# Patient Record
Sex: Female | Born: 1935 | Race: White | Hispanic: No | Smoking: Former smoker
Health system: Southern US, Community
[De-identification: ages and names within clinical notes are randomized; demographics above are authoritative.]

## PROBLEM LIST (undated history)

## (undated) DIAGNOSIS — K2990 Gastroduodenitis, unspecified, without bleeding: Secondary | ICD-10-CM

## (undated) DIAGNOSIS — Z860100 Personal history of colon polyps, unspecified: Secondary | ICD-10-CM

## (undated) DIAGNOSIS — F419 Anxiety disorder, unspecified: Secondary | ICD-10-CM

## (undated) DIAGNOSIS — R778 Other specified abnormalities of plasma proteins: Secondary | ICD-10-CM

## (undated) DIAGNOSIS — D649 Anemia, unspecified: Secondary | ICD-10-CM

## (undated) DIAGNOSIS — K222 Esophageal obstruction: Secondary | ICD-10-CM

## (undated) DIAGNOSIS — K5790 Diverticulosis of intestine, part unspecified, without perforation or abscess without bleeding: Secondary | ICD-10-CM

## (undated) DIAGNOSIS — R7989 Other specified abnormal findings of blood chemistry: Secondary | ICD-10-CM

## (undated) DIAGNOSIS — K2970 Gastritis, unspecified, without bleeding: Secondary | ICD-10-CM

## (undated) DIAGNOSIS — R002 Palpitations: Secondary | ICD-10-CM

## (undated) DIAGNOSIS — I10 Essential (primary) hypertension: Secondary | ICD-10-CM

## (undated) DIAGNOSIS — C4492 Squamous cell carcinoma of skin, unspecified: Secondary | ICD-10-CM

## (undated) DIAGNOSIS — I499 Cardiac arrhythmia, unspecified: Secondary | ICD-10-CM

## (undated) HISTORY — DX: Essential (primary) hypertension: I10

## (undated) HISTORY — DX: Anemia, unspecified: D64.9

## (undated) HISTORY — DX: Diverticulosis of intestine, part unspecified, without perforation or abscess without bleeding: K57.90

## (undated) HISTORY — DX: Cardiac arrhythmia, unspecified: I49.9

## (undated) HISTORY — DX: Anxiety disorder, unspecified: F41.9

## (undated) HISTORY — DX: Gastritis, unspecified, without bleeding: K29.70

## (undated) HISTORY — DX: Personal history of colonic polyps: Z86.010

## (undated) HISTORY — PX: OTHER SURGICAL HISTORY: SHX169

## (undated) HISTORY — PX: HEMORRHOID SURGERY: SHX153

## (undated) HISTORY — PX: BREAST REDUCTION SURGERY: SHX8

## (undated) HISTORY — DX: Other specified abnormal findings of blood chemistry: R79.89

## (undated) HISTORY — DX: Personal history of colon polyps, unspecified: Z86.0100

## (undated) HISTORY — DX: Gastritis, unspecified, without bleeding: K29.90

## (undated) HISTORY — PX: ABDOMINAL HYSTERECTOMY: SHX81

## (undated) HISTORY — DX: Esophageal obstruction: K22.2

## (undated) HISTORY — DX: Other specified abnormalities of plasma proteins: R77.8

---
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
## 1997-10-28 ENCOUNTER — Emergency Department (HOSPITAL_COMMUNITY): Admission: EM | Admit: 1997-10-28 | Discharge: 1997-10-28 | Payer: Self-pay | Admitting: Emergency Medicine

## 1998-01-08 ENCOUNTER — Emergency Department (HOSPITAL_COMMUNITY): Admission: EM | Admit: 1998-01-08 | Discharge: 1998-01-08 | Payer: Self-pay | Admitting: Emergency Medicine

## 1998-01-10 ENCOUNTER — Ambulatory Visit (HOSPITAL_COMMUNITY): Admission: RE | Admit: 1998-01-10 | Discharge: 1998-01-10 | Payer: Self-pay | Admitting: Gastroenterology

## 2000-05-25 ENCOUNTER — Ambulatory Visit (HOSPITAL_COMMUNITY): Admission: RE | Admit: 2000-05-25 | Discharge: 2000-05-25 | Payer: Self-pay | Admitting: Vascular Surgery

## 2000-05-25 ENCOUNTER — Encounter: Payer: Self-pay | Admitting: Vascular Surgery

## 2001-02-28 ENCOUNTER — Encounter: Payer: Self-pay | Admitting: Internal Medicine

## 2001-02-28 ENCOUNTER — Observation Stay (HOSPITAL_COMMUNITY): Admission: EM | Admit: 2001-02-28 | Discharge: 2001-03-01 | Payer: Self-pay | Admitting: Emergency Medicine

## 2002-02-20 ENCOUNTER — Encounter (INDEPENDENT_AMBULATORY_CARE_PROVIDER_SITE_OTHER): Payer: Self-pay | Admitting: Specialist

## 2002-02-20 ENCOUNTER — Inpatient Hospital Stay (HOSPITAL_COMMUNITY): Admission: RE | Admit: 2002-02-20 | Discharge: 2002-02-22 | Payer: Self-pay | Admitting: Obstetrics and Gynecology

## 2002-08-30 ENCOUNTER — Encounter: Payer: Self-pay | Admitting: Emergency Medicine

## 2002-08-30 ENCOUNTER — Emergency Department (HOSPITAL_COMMUNITY): Admission: EM | Admit: 2002-08-30 | Discharge: 2002-08-30 | Payer: Self-pay | Admitting: Emergency Medicine

## 2004-03-31 ENCOUNTER — Ambulatory Visit: Payer: Self-pay | Admitting: Internal Medicine

## 2004-04-07 ENCOUNTER — Ambulatory Visit: Payer: Self-pay | Admitting: Internal Medicine

## 2004-04-22 ENCOUNTER — Ambulatory Visit: Payer: Self-pay | Admitting: Internal Medicine

## 2004-06-24 ENCOUNTER — Ambulatory Visit: Payer: Self-pay

## 2004-11-13 ENCOUNTER — Ambulatory Visit: Payer: Self-pay | Admitting: Internal Medicine

## 2005-04-06 ENCOUNTER — Ambulatory Visit: Payer: Self-pay | Admitting: Internal Medicine

## 2005-07-09 ENCOUNTER — Ambulatory Visit: Payer: Self-pay | Admitting: Internal Medicine

## 2005-07-16 ENCOUNTER — Ambulatory Visit: Payer: Self-pay | Admitting: Internal Medicine

## 2005-07-19 ENCOUNTER — Ambulatory Visit: Payer: Self-pay | Admitting: Internal Medicine

## 2005-08-13 ENCOUNTER — Ambulatory Visit: Payer: Self-pay | Admitting: Internal Medicine

## 2006-01-19 ENCOUNTER — Ambulatory Visit: Payer: Self-pay | Admitting: Internal Medicine

## 2007-02-03 ENCOUNTER — Ambulatory Visit: Payer: Self-pay | Admitting: Internal Medicine

## 2008-03-11 ENCOUNTER — Ambulatory Visit: Payer: Self-pay | Admitting: Internal Medicine

## 2008-03-26 ENCOUNTER — Ambulatory Visit: Payer: Self-pay | Admitting: Internal Medicine

## 2008-03-26 DIAGNOSIS — J019 Acute sinusitis, unspecified: Secondary | ICD-10-CM | POA: Insufficient documentation

## 2008-04-02 DIAGNOSIS — Z860100 Personal history of colon polyps, unspecified: Secondary | ICD-10-CM | POA: Insufficient documentation

## 2008-04-02 DIAGNOSIS — M810 Age-related osteoporosis without current pathological fracture: Secondary | ICD-10-CM | POA: Insufficient documentation

## 2008-04-02 DIAGNOSIS — K5730 Diverticulosis of large intestine without perforation or abscess without bleeding: Secondary | ICD-10-CM | POA: Insufficient documentation

## 2008-04-02 DIAGNOSIS — K219 Gastro-esophageal reflux disease without esophagitis: Secondary | ICD-10-CM

## 2008-04-02 DIAGNOSIS — F411 Generalized anxiety disorder: Secondary | ICD-10-CM

## 2008-04-02 DIAGNOSIS — F419 Anxiety disorder, unspecified: Secondary | ICD-10-CM | POA: Insufficient documentation

## 2008-04-02 DIAGNOSIS — D509 Iron deficiency anemia, unspecified: Secondary | ICD-10-CM | POA: Insufficient documentation

## 2008-04-02 DIAGNOSIS — I10 Essential (primary) hypertension: Secondary | ICD-10-CM | POA: Insufficient documentation

## 2008-04-02 HISTORY — DX: Gastro-esophageal reflux disease without esophagitis: K21.9

## 2008-07-31 ENCOUNTER — Ambulatory Visit: Payer: Self-pay | Admitting: Internal Medicine

## 2008-07-31 DIAGNOSIS — I83009 Varicose veins of unspecified lower extremity with ulcer of unspecified site: Secondary | ICD-10-CM | POA: Insufficient documentation

## 2008-07-31 DIAGNOSIS — L97909 Non-pressure chronic ulcer of unspecified part of unspecified lower leg with unspecified severity: Secondary | ICD-10-CM

## 2008-07-31 LAB — CONVERTED CEMR LAB
ALT: 13 units/L (ref 0–35)
AST: 29 units/L (ref 0–37)
Albumin: 4.2 g/dL (ref 3.5–5.2)
Alkaline Phosphatase: 73 units/L (ref 39–117)
BUN: 13 mg/dL (ref 6–23)
Basophils Absolute: 0 10*3/uL (ref 0.0–0.1)
Basophils Relative: 0.9 % (ref 0.0–3.0)
Bilirubin Urine: NEGATIVE
Bilirubin, Direct: 0.4 mg/dL — ABNORMAL HIGH (ref 0.0–0.3)
CO2: 28 meq/L (ref 19–32)
Calcium: 9.6 mg/dL (ref 8.4–10.5)
Chloride: 105 meq/L (ref 96–112)
Cholesterol: 210 mg/dL (ref 0–200)
Creatinine, Ser: 0.7 mg/dL (ref 0.4–1.2)
Direct LDL: 119.5 mg/dL
Eosinophils Absolute: 0.1 10*3/uL (ref 0.0–0.7)
Eosinophils Relative: 1.5 % (ref 0.0–5.0)
GFR calc Af Amer: 105 mL/min
GFR calc non Af Amer: 86 mL/min
Glucose, Bld: 94 mg/dL (ref 70–99)
HCT: 38.9 % (ref 36.0–46.0)
HDL: 46.3 mg/dL (ref >39.0)
Hemoglobin: 13.5 g/dL (ref 12.0–15.0)
Ketones, ur: NEGATIVE mg/dL
Leukocytes, UA: NEGATIVE
Lymphocytes Relative: 26.4 % (ref 12.0–46.0)
MCHC: 34.6 g/dL (ref 30.0–36.0)
MCV: 90.6 fL (ref 78.0–100.0)
Monocytes Absolute: 0.3 10*3/uL (ref 0.1–1.0)
Monocytes Relative: 6.9 % (ref 3.0–12.0)
Neutro Abs: 2.9 10*3/uL (ref 1.4–7.7)
Neutrophils Relative %: 64.3 % (ref 43.0–77.0)
Nitrite: NEGATIVE
Platelets: 249 10*3/uL (ref 150–400)
Potassium: 5 meq/L (ref 3.5–5.1)
RBC: 4.29 M/uL (ref 3.87–5.11)
RDW: 13.3 % (ref 11.5–14.6)
Sed Rate: 25 mm/hr — ABNORMAL HIGH (ref 0–22)
Sodium: 141 meq/L (ref 135–145)
Specific Gravity, Urine: <=1.005 (ref 1.000–1.035)
TSH: 3.44 microintl units/mL (ref 0.35–5.50)
Total Bilirubin: 1.2 mg/dL (ref 0.3–1.2)
Total CHOL/HDL Ratio: 4.5
Total Protein, Urine: NEGATIVE mg/dL
Total Protein: 7.2 g/dL (ref 6.0–8.3)
Triglycerides: 151 mg/dL — ABNORMAL HIGH (ref 0–149)
Urine Glucose: NEGATIVE mg/dL
Urobilinogen, UA: 0.2 (ref 0.0–1.0)
VLDL: 30 mg/dL (ref 0–40)
Vit D, 25-Hydroxy: 21 ng/mL — ABNORMAL LOW (ref 30–89)
WBC: 4.5 10*3/uL (ref 4.5–10.5)
pH: 7 (ref 5.0–8.0)

## 2008-08-06 ENCOUNTER — Telehealth: Payer: Self-pay | Admitting: Internal Medicine

## 2009-03-17 ENCOUNTER — Encounter: Payer: Self-pay | Admitting: Internal Medicine

## 2009-03-20 ENCOUNTER — Ambulatory Visit: Payer: Self-pay | Admitting: Internal Medicine

## 2009-03-20 DIAGNOSIS — I491 Atrial premature depolarization: Secondary | ICD-10-CM | POA: Insufficient documentation

## 2009-03-20 HISTORY — DX: Atrial premature depolarization: I49.1

## 2009-09-01 ENCOUNTER — Telehealth: Payer: Self-pay | Admitting: Internal Medicine

## 2009-09-16 ENCOUNTER — Emergency Department (HOSPITAL_COMMUNITY): Admission: EM | Admit: 2009-09-16 | Discharge: 2009-09-16 | Payer: Self-pay | Admitting: Emergency Medicine

## 2010-03-23 ENCOUNTER — Ambulatory Visit: Payer: Self-pay | Admitting: Internal Medicine

## 2010-03-26 ENCOUNTER — Telehealth: Payer: Self-pay | Admitting: Internal Medicine

## 2010-06-21 LAB — CONVERTED CEMR LAB
ALT: 12 units/L (ref 0–35)
AST: 22 units/L (ref 0–37)
Albumin: 4.3 g/dL (ref 3.5–5.2)
Alkaline Phosphatase: 75 units/L (ref 39–117)
BUN: 16 mg/dL (ref 6–23)
Basophils Absolute: 0 10*3/uL (ref 0.0–0.1)
Basophils Relative: 0.5 % (ref 0.0–3.0)
Bilirubin, Direct: 0.1 mg/dL (ref 0.0–0.3)
CO2: 28 meq/L (ref 19–32)
Calcium: 9.1 mg/dL (ref 8.4–10.5)
Chloride: 98 meq/L (ref 96–112)
Creatinine, Ser: 0.7 mg/dL (ref 0.4–1.2)
Eosinophils Absolute: 0 10*3/uL (ref 0.0–0.7)
Eosinophils Relative: 0.6 % (ref 0.0–5.0)
GFR calc non Af Amer: 80.64 mL/min (ref >60)
Glucose, Bld: 76 mg/dL (ref 70–99)
HCT: 39 % (ref 36.0–46.0)
Hemoglobin: 13 g/dL (ref 12.0–15.0)
Lymphocytes Relative: 25.4 % (ref 12.0–46.0)
Lymphs Abs: 1.6 10*3/uL (ref 0.7–4.0)
MCHC: 33.3 g/dL (ref 30.0–36.0)
MCV: 93.9 fL (ref 78.0–100.0)
Monocytes Absolute: 0.4 10*3/uL (ref 0.1–1.0)
Monocytes Relative: 6.3 % (ref 3.0–12.0)
Neutro Abs: 4.4 10*3/uL (ref 1.4–7.7)
Neutrophils Relative %: 67.2 % (ref 43.0–77.0)
Platelets: 281 10*3/uL (ref 150.0–400.0)
Potassium: 4.7 meq/L (ref 3.5–5.1)
RBC: 4.15 M/uL (ref 3.87–5.11)
RDW: 14.3 % (ref 11.5–14.6)
Sodium: 141 meq/L (ref 135–145)
TSH: 2.86 microintl units/mL (ref 0.35–5.50)
Total Bilirubin: 0.9 mg/dL (ref 0.3–1.2)
Total Protein: 7 g/dL (ref 6.0–8.3)
WBC: 6.5 10*3/uL (ref 4.5–10.5)

## 2010-06-25 NOTE — Assessment & Plan Note (Signed)
Summary: f1y per check out/lg   CC:  1 year ROV; no complaints.  History of Present Illness: [name] [name] returns today for followup.  She is a very pleasant elderly woman with history of symptomatic palpitations and nonsustained SVT who we placed on flecainide therapy over 10 years ago.  She returns today for followup, and she has done well.  She has very rare palpitations which have not been particularly symptomatic over the last year.  No other complaints today.  Current Medications (verified): 1)  Vitamin D3 1000 Unit  Tabs (Cholecalciferol) .... 2 By Mouth Daily 2)  Vitamin D 16109 Unit  Caps (Ergocalciferol) .Marland Kitchen.. 1 By Mouth Weekly 3)  Flecainide Acetate 100 Mg Tabs (Flecainide Acetate) .... One Tab in The A.m.and 1/2 Tab in At Night  Allergies (verified): No Known Drug Allergies  Past History:  Past Medical History: Last updated: 07/31/2008 Anemia-iron deficiency Anxiety Colonic polyps, hx of - cecal Osteoporosis Hypertension paroxysmal SVT and symptomatic PAC's GERD esophageal stricture Diverticulosis, colon Vit D def 2010  Past Surgical History: Last updated: 03/26/2008 s/p rectocele repair 2003 Hemorrhoidectomy Hysterectomy s/p breast reduction surgury 1996  Review of Systems  The patient denies chest pain, syncope, dyspnea on exertion, and peripheral edema.    Vital Signs:  Patient profile:   77 year old female Height:      66.5 inches Weight:      160 pounds BMI:     25.53 Pulse rate:   74 / minute BP sitting:   160 / 80  (left arm) Cuff size:   regular  Vitals Entered By: [name] [name], EMT-P (March 20, 2009 11:44 AM)  Physical Exam  General:  Well developed, well nourished, in no acute distress.  HEENT: normal Neck: supple. No JVD. Carotids 2+ bilaterally no bruits Cor: RRR no rubs, gallops or murmur Lungs: CTA Ab: soft, nontender. nondistended. No HSM. Good bowel sounds Ext: warm. no cyanosis, clubbing or edema Neuro: alert and oriented.  Grossly nonfocal. affect pleasant    EKG  Procedure date:  03/20/2009  Findings:      Normal sinus rhythm with rate of:  74.  Impression & Recommendations:  Problem # 1:  ATRIAL PREMATURE BEATS (ICD-427.61) For over 10 yrs, [name] [name] has remained asymptomatic on flecainide.  I will continue this medicine.  We may at some point consider reducing her dose but her QRS today is only minimally prolonged so will continue as we are. The following medications were removed from the medication list:    Flecainide Acetate 100 Mg Tabs (Flecainide acetate) .Marland Kitchen... 1 1/2 by mouth daily Her updated medication list for this problem includes:    Flecainide Acetate 100 Mg Tabs (Flecainide acetate) ..... One tab in the a.m.and 1/2 tab in at night  Problem # 2:  HYPERTENSION (ICD-401.9) A low sodium diet is recommended in addition to her meds below. The following medications were removed from the medication list:    Hydrochlorothiazide 12.5 Mg Tabs (Hydrochlorothiazide) .Marland Kitchen... Take 1 tab  by mouth every morning  Patient Instructions: 1)  Your physician recommends that you schedule a follow-up appointment in: 12 months with Dr Ladona Ridgel Prescriptions: FLECAINIDE ACETATE 100 MG TABS (FLECAINIDE ACETATE) one tab in the a.m.and 1/2 tab in at night  #45 x 11   Entered by:   Dennis Bast, RN, BSN   Authorized by:   Laren Boom, MD, [facility]   Signed by:   Dennis Bast, RN, BSN on 03/20/2009   Method used:  [facility]        [facility]  [address] [phone][facility] (retail)       [address]       [place], [place]  [id number]       Ph: [phone] or [phone]       Fax: [phone]   RxID:   [id number]

## 2010-06-25 NOTE — Progress Notes (Signed)
Summary: sided effect from flecainide  Phone Note Call from Patient Call back at Home Phone [phone]   Refills Requested: Medication #1:   100 MG TABS one tab in the a.m.and 1/2 tab in at night Caller: Patient Reason for Call: Talk to Nurse Complaint: Nausea/Vomiting/Diarrhea Summary of Call: meds was changed FLECAINIDE ACETATE, c/o sided effect.  Initial call taken by: Lorne Skeens,  September 01, 2009 3:33 PM  Follow-up for Phone Call        spoke with pt she has been sick since 3:30AM.  She will call Dr Posey Rea and she is he can call her in something for the N/V.  Her temp is 99.9.  She probably has a virus.  Will call primary to get meds Dennis Bast, RN, BSN  September 01, 2009 3:42 PM

## 2010-06-25 NOTE — Assessment & Plan Note (Signed)
Summary: yearly/sl   Visit Type:  Follow-up Primary Provider:  Tresa Garter MD   History of Present Illness: [name] [name] returns today for followup.  She is a very pleasant elderly woman with history of symptomatic palpitations and nonsustained SVT who we placed on flecainide therapy over 10 years ago.  She returns today for followup, and she has done well.  She has very rare palpitations which have not been particularly symptomatic over the last year.  No other complaints today.  Current Medications (verified): 1)  Vitamin D3 1000 Unit  Tabs (Cholecalciferol) .... 2 By Mouth Daily 2)  Flecainide Acetate 100 Mg Tabs (Flecainide Acetate) .... One Tab in The A.m.and 1/2 Tab in At Night 3)  Lorazepam 1 Mg Tabs (Lorazepam) .... 1/2 Tab As Needed  Allergies (verified): No Known Drug Allergies  Past History:  Past Medical History: Last updated: 07/31/2008 Anemia-iron deficiency Anxiety Colonic polyps, hx of - cecal Osteoporosis Hypertension paroxysmal SVT and symptomatic PAC's GERD esophageal stricture Diverticulosis, colon Vit D def 2010  Past Surgical History: Last updated: 03/26/2008 s/p rectocele repair 2003 Hemorrhoidectomy Hysterectomy s/p breast reduction surgury 1996  Review of Systems  The patient denies chest pain, syncope, dyspnea on exertion, and peripheral edema.    Vital Signs:  Patient profile:   77 year old female Height:      66.5 inches Weight:      162 pounds BMI:     25.85 Pulse rate:   72 / minute BP sitting:   140 / 90  (left arm)  Vitals Entered By: Laurance Flatten CMA (March 23, 2010 11:42 AM)  Physical Exam  General:  Well developed, well nourished, in no acute distress.  HEENT: normal Neck: supple. No JVD. Carotids 2+ bilaterally no bruits Cor: RRR no rubs, gallops or murmur Lungs: CTA Ab: soft, nontender. nondistended. No HSM. Good bowel sounds Ext: warm. no cyanosis, clubbing or edema Neuro: alert and oriented. Grossly nonfocal.  affect pleasant    EKG  Procedure date:  03/23/2010  Findings:      Normal sinus rhythm with rate of:  67.  Impression & Recommendations:  Problem # 1:  ATRIAL PREMATURE BEATS (ICD-427.61) Her symptoms remain well controlled on flecainide.  As a consequence of the above problem and her flecainide, I will obtain screening labs today. Her updated medication list for this problem includes:    Flecainide Acetate 100 Mg Tabs (Flecainide acetate) ..... One tab in the a.m.and 1/2 tab in at night  Orders: TLB-BMP (Basic Metabolic Panel-BMET) (80048-METABOL) TLB-CBC Platelet - w/Differential (85025-CBCD) TLB-TSH (Thyroid Stimulating Hormone) (84443-TSH) TLB-Hepatic/Liver Function Pnl (80076-HEPATIC)  Problem # 2:  HYPERTENSION (ICD-401.9) Her blood pressure appears to be fairly well controlled. I have asked that she reduce her sodium intake.  She has been intolerant to medical therapy in the past with beta blockers. Orders: TLB-BMP (Basic Metabolic Panel-BMET) (80048-METABOL) TLB-CBC Platelet - w/Differential (85025-CBCD) TLB-TSH (Thyroid Stimulating Hormone) (84443-TSH) TLB-Hepatic/Liver Function Pnl (80076-HEPATIC)  Patient Instructions: 1)  Your physician wants you to follow-up in: 12 months with Dr Court Joy will receive a reminder letter in the mail two months in advance. If you don't receive a letter, please call our office to schedule the follow-up appointment.

## 2010-06-25 NOTE — Assessment & Plan Note (Signed)
Summary: head hurt,fever,cough,/$50/pn   Vital Signs:  Patient Profile:   77 Years Old Female O2 Sat:      94 % O2 treatment:    Room Air Temp:     99.2 degrees F oral Pulse rate:   83 / minute BP sitting:   140 / 78  (left arm) Cuff size:   regular  Vitals Entered By: Payton Spark CMA (March 26, 2008 2:24 PM)                 Preventive Care Screening  Colonoscopy:    Date:  12/29/2001    Next Due:  12/2006    Results:  Diverticulosis   Bone Density:    Date:  02/28/2004    Results:  abnormal std dev   Chief Complaint:  HA, fever, and cough.  History of Present Illness: here with acute onset sinus pain, pressure, fever and greenish d/c; no CP, sob, wheezing, or DOE    Updated Prior Medication List: FLECAINIDE ACETATE 100 MG TABS (FLECAINIDE ACETATE) 1 1/2 by mouth daily AZITHROMYCIN 250 MG TABS (AZITHROMYCIN) 2po qd for 1 day, then 1po qd for 4days, then stop PROMETHAZINE-CODEINE 6.25-10 MG/5ML SYRP (PROMETHAZINE-CODEINE) 1 tsp by mouth q 6 hrs as needed cough  Current Allergies (reviewed today): No known allergies   Past Medical History:    Reviewed history and no changes required:       Anemia-iron deficiency       Anxiety       Colonic polyps, hx of - cecal       Osteoporosis       Hypertension       paroxysmal SVT and symptomatic PAC's       GERD       esophageal stricture       Diverticulosis, colon  Past Surgical History:    Reviewed history and no changes required:       s/p rectocele repair 2003       Hemorrhoidectomy       Hysterectomy       s/p breast reduction surgury 1996   Family History:    Reviewed history and no changes required:       2 couine with lung cancer       remote relative with colon cancer       father died with CVA at 89 yo       sister with HTN       brother with renal disease  Social History:    Reviewed history and no changes required:       retired - Neurosurgeon       Married       Former  Smoker   Risk Factors:  Tobacco use:  quit  Colonoscopy History:     Date of Last Colonoscopy:  12/29/2001    Results:  Diverticulosis    Review of Systems       all otherwise negative    Physical Exam  General:     alert and well-developed.  , mild ill  Head:     Normocephalic and atraumatic without obvious abnormalities. No apparent alopecia or balding. Eyes:     No corneal or conjunctival inflammation noted. EOMI. Perrla. Ears:     bilat tm's red, sinus tender bilat Nose:     nasal dischargemucosal pallor and mucosal erythema.   Mouth:     pharyngeal erythema and fair dentition.   Neck:     supple and  full ROM.   Lungs:     Normal respiratory effort, chest expands symmetrically. Lungs are clear to auscultation, no crackles or wheezes. Heart:     Normal rate and regular rhythm. S1 and S2 normal without gallop, murmur, click, rub or other extra sounds. Extremities:     no edema, no ulcers     Impression & Recommendations:  Problem # 1:  SINUSITIS- ACUTE-NOS (ICD-461.9) tx with antibx, and mucine DM otc -  Her updated medication list for this problem includes:    Azithromycin 250 Mg Tabs (Azithromycin) .Marland Kitchen... 2po qd for 1 day, then 1po qd for 4days, then stop    Promethazine-codeine 6.25-10 Mg/40ml Syrp (Promethazine-codeine) .Marland Kitchen... 1 tsp by mouth q 6 hrs as needed cough   Complete Medication List: 1)  Flecainide Acetate 100 Mg Tabs (Flecainide acetate) .Marland Kitchen.. 1 1/2 by mouth daily 2)  Azithromycin 250 Mg Tabs (Azithromycin) .... 2po qd for 1 day, then 1po qd for 4days, then stop 3)  Promethazine-codeine 6.25-10 Mg/72ml Syrp (Promethazine-codeine) .Marland Kitchen.. 1 tsp by mouth q 6 hrs as needed cough   Patient Instructions: 1)  Please take all new medications as prescribed 2)  Continue all medications that you may have been taking previously 3)  you can also take Mucine DM (OTC) for congestion if needed   Prescriptions: PROMETHAZINE-CODEINE 6.25-10 MG/5ML SYRP  (PROMETHAZINE-CODEINE) 1 tsp by mouth q 6 hrs as needed cough  #6 oz x 1   Entered and Authorized by:   Corwin Levins MD   Signed by:   Corwin Levins MD on 03/26/2008   Method used:   Print then Give to Patient   RxID:   [id number] AZITHROMYCIN 250 MG TABS (AZITHROMYCIN) 2po qd for 1 day, then 1po qd for 4days, then stop  #6 x 1   Entered and Authorized by:   Corwin Levins MD   Signed by:   Corwin Levins MD on 03/26/2008   Method used:   Print then Give to Patient   RxID:   [id number]  ]

## 2010-06-25 NOTE — Assessment & Plan Note (Signed)
Summary: left leg red spot//?infection/$50/cd   Vital Signs:  Patient profile:   77 year old female Weight:      164 pounds Temp:     97 degrees F oral Pulse rate:   72 / minute BP sitting:   160 / 100  (left arm)  Vitals Entered By: Tora Perches (July 31, 2008 9:58 AM) Is Patient Diabetic? No   History of Present Illness: C/o painless ulcer on L inner ankle x 2 wks. F/u HTN, GERD  Problems Prior to Update: 1)  Diverticulosis, Colon  (ICD-562.10) 2)  Gerd  (ICD-530.81) 3)  Hypertension  (ICD-401.9) 4)  Osteoporosis  (ICD-733.00) 5)  Colonic Polyps, Hx of  (ICD-V12.72) 6)  Anxiety  (ICD-300.00) 7)  Anemia-iron Deficiency  (ICD-280.9) 8)  Sinusitis- Acute-nos  (ICD-461.9)  Medications Prior to Update: 1)  Flecainide Acetate 100 Mg Tabs (Flecainide Acetate) .Marland Kitchen.. 1 1/2 By Mouth Daily 2)  Azithromycin 250 Mg Tabs (Azithromycin) .... 2po Qd For 1 Day, Then 1po Qd For 4days, Then Stop 3)  Promethazine-Codeine 6.25-10 Mg/38ml Syrp (Promethazine-Codeine) .Marland Kitchen.. 1 Tsp By Mouth Q 6 Hrs As Needed Cough  Current Medications (verified): 1)  Flecainide Acetate 100 Mg Tabs (Flecainide Acetate) .Marland Kitchen.. 1 1/2 By Mouth Daily  Allergies (verified): No Known Drug Allergies  Past Medical History:    Anemia-iron deficiency    Anxiety    Colonic polyps, hx of - cecal    Osteoporosis    Hypertension    paroxysmal SVT and symptomatic PAC's    GERD    esophageal stricture    Diverticulosis, colon    Vit D def 2010  Physical Exam  General:  NAD Mouth:  Oral mucosa and oropharynx without lesions or exudates.  Teeth in good repair. Neck:  supple and full ROM.   Lungs:  Normal respiratory effort, chest expands symmetrically. Lungs are clear to auscultation, no crackles or wheezes. Heart:  Normal rate and regular rhythm. S1 and S2 normal without gallop, murmur, click, rub or other extra sounds. Msk:  No deformity or scoliosis noted of thoracic or lumbar spine.   Extremities:  trace left pedal  edema.   Neurologic:  No cranial nerve deficits noted. Station and gait are normal. Plantar reflexes are down-going bilaterally. DTRs are symmetrical throughout. Sensory, motor and coordinative functions appear intact. Skin:  8 mm shallow ulcer distal medial leg Psych:  Cognition and judgment appear intact. Alert and cooperative with normal attention span and concentration. No apparent delusions, illusions, hallucinations   Impression & Recommendations:  Problem # 1:  VENOUS STASIS ULCER (ICD-454.0) LLE Assessment New  Elevate LE Compr socks Triamc qid  Orders: TLB-BMP (Basic Metabolic Panel-BMET) (80048-METABOL) TLB-CBC Platelet - w/Differential (85025-CBCD) TLB-Lipid Panel (80061-LIPID) TLB-Hepatic/Liver Function Pnl (80076-HEPATIC) TLB-Sedimentation Rate (ESR) (85652-ESR) TLB-TSH (Thyroid Stimulating Hormone) (84443-TSH) T-Vitamin D (25-Hydroxy) (04540-98119) TLB-Udip ONLY (81003-UDIP)  Problem # 2:  HYPERTENSION (ICD-401.9) Assessment: Comment Only  States BP OK at home  Her updated medication list for this problem includes:    Hydrochlorothiazide 12.5 Mg Tabs (Hydrochlorothiazide) .Marland Kitchen... Take 1 tab  by mouth every morning  Orders: T-Vitamin D (25-Hydroxy) (14782-95621) TLB-BMP (Basic Metabolic Panel-BMET) (80048-METABOL) TLB-CBC Platelet - w/Differential (85025-CBCD) TLB-Lipid Panel (80061-LIPID) TLB-Hepatic/Liver Function Pnl (80076-HEPATIC) TLB-Sedimentation Rate (ESR) (85652-ESR) TLB-TSH (Thyroid Stimulating Hormone) (84443-TSH) TLB-Udip ONLY (81003-UDIP)  Problem # 3:  GERD (ICD-530.81) Assessment: Comment Only  Problem # 4:  ANXIETY (ICD-300.00) Assessment: Comment Only  Orders: T-Vitamin D (25-Hydroxy) (30865-78469) TLB-BMP (Basic Metabolic Panel-BMET) (80048-METABOL) TLB-CBC Platelet - w/Differential (85025-CBCD)  TLB-Lipid Panel (80061-LIPID) TLB-Hepatic/Liver Function Pnl (80076-HEPATIC) TLB-Sedimentation Rate (ESR) (85652-ESR) TLB-TSH (Thyroid  Stimulating Hormone) (84443-TSH) TLB-Udip ONLY (81003-UDIP)  Complete Medication List: 1)  Flecainide Acetate 100 Mg Tabs (Flecainide acetate) .Marland Kitchen.. 1 1/2 by mouth daily 2)  Triamcinolone Acetonide 0.1 % Oint (Triamcinolone acetonide) .... Use 4 times a day 3)  Vitamin D3 1000 Unit Tabs (Cholecalciferol) .... 2 by mouth daily 4)  Hydrochlorothiazide 12.5 Mg Tabs (Hydrochlorothiazide) .... Take 1 tab  by mouth every morning 5)  Vitamin D 16109 Unit Caps (Ergocalciferol) .Marland Kitchen.. 1 by mouth weekly  Patient Instructions: 1)  Please schedule a follow-up appointment in 1 month. 2)  The medication list was reviewed and reconciled.  All changed / newly prescribed medications were explained.  A complete medication list was provided to the patient / caregiver.  Prescriptions: VITAMIN D 60454 UNIT  CAPS (ERGOCALCIFEROL) 1 by mouth weekly  #8 x 0   Entered and Authorized by:   Tresa Garter MD   Signed by:   Tresa Garter MD on 08/07/2008   Method used:   Print then Give to Patient   RxID:   [id number] HYDROCHLOROTHIAZIDE 12.5 MG  TABS (HYDROCHLOROTHIAZIDE) Take 1 tab  by mouth every morning  #30 x 12   Entered and Authorized by:   Tresa Garter MD   Signed by:   Tresa Garter MD on 07/31/2008   Method used:   Print then Give to Patient   RxID:   [id number] TRIAMCINOLONE ACETONIDE 0.1 % OINT (TRIAMCINOLONE ACETONIDE) Use 4 times a day  #45 g x 3   Entered and Authorized by:   Tresa Garter MD   Signed by:   Tresa Garter MD on 07/31/2008   Method used:   Print then Give to Patient   RxID:   [id number]

## 2010-06-25 NOTE — Progress Notes (Signed)
Summary: lab results  Phone Note Call from Patient Call back at Home Phone [phone]   Caller: Patient Summary of Call: Patient called requesting lab results Initial call taken by: Rock Nephew CMA,  August 06, 2008 4:13 PM  Follow-up for Phone Call        Labs OK Vit D is low: take Rx Follow-up by: Tresa Garter MD,  August 07, 2008 12:06 PM      Prescriptions: VITAMIN D 14782 UNIT  CAPS (ERGOCALCIFEROL) 1 by mouth weekly  #8 x 0   Entered by:   Lamar Sprinkles   Authorized by:   Tresa Garter MD   Signed by:   Lamar Sprinkles on 08/07/2008   Method used:   [facility]        [facility]  [address] [phone]* (retail)       [address]       [place], [place]  [address]       Ph: [phone] or [phone]       Fax: [phone]   RxID:   [id number]

## 2010-06-25 NOTE — Medication Information (Signed)
Summary: Tax adviser   Imported By: Elenor Legato 03/20/2009 10:33:17  _____________________________________________________________________  External Attachment:    Type:   Image     Comment:   External Document

## 2010-06-25 NOTE — Progress Notes (Signed)
Summary: pt rtn your call  Phone Note Call from Patient Call back at Home Phone [phone]   Caller: Patient Reason for Call: Talk to Nurse, Talk to Doctor Summary of Call: she got your message about her lab work and would like you to mail her a copy Initial call taken by: Omer Jack,  March 26, 2010 4:44 PM  Follow-up for Phone Call        mailed Dennis Bast, RN, BSN  March 26, 2010 5:07 PM

## 2010-06-25 NOTE — Miscellaneous (Signed)
  Clinical Lists Changes  Medications: Added new medication of FLECAINIDE ACETATE 100 MG TABS (FLECAINIDE ACETATE) one tab in the a.m.and 1/2 tab in at night

## 2010-10-06 NOTE — Assessment & Plan Note (Signed)
[facility]                         ELECTROPHYSIOLOGY OFFICE NOTE   [name], [name]                          MRN:          [id number]  DATE:02/03/2007                            DOB:          December 24, 1929    [name] [name] returns today for followup office visit.  She is a very  pleasant, elderly woman with a history of symptomatic palpitations and  documented nonsustained atrial tachycardia who has maintained sinus  rhythm very nicely in the past year on flecainide therapy.  She denies  chest pain or shortness of breath.  She has very minimal palpitations.  She does complain of chronic anxiety.   PHYSICAL EXAMINATION:  GENERAL:  She is a pleasant, well-appearing,  elderly woman in no distress.  VITAL SIGNS:  Blood pressure 172/82, pulse 72 and regular, respirations  16, weight 163 pounds.  NECK:  No jugular venous distention.  No thyromegaly.  Trachea midline.  LUNGS:  Clear bilaterally to auscultation.  No wheezes, rales or rhonchi  present.  CARDIAC:  Regular rate and rhythm with normal S1, S2.  There are no  murmurs, rubs or gallops appreciated.  ABDOMEN:  Soft, nontender, nondistended.  There is no organomegaly.  EXTREMITIES:  No clubbing, cyanosis or edema.  Pulses are 2+ and  symmetric.   EKG demonstrates sinus rhythm with normal axis and intervals.  There was  first-degree AV block.   IMPRESSION:  1. Symptomatic palpitations.  2. Documented nonsustained atrial tachycardia.  3. Chronic flecainide therapy.   DISCUSSION:  Overall, [name] [name] is stable.  She will continue her  flecainide at 100 mg in the morning and 50 mg in the evening.  I have  given her a prescription for a very low dose of alprazolam for anxiety.  We will see her back in the office in 1 year.     Doylene Canning. Ladona Ridgel, MD  Electronically Signed    GWT/MedQ  DD: 02/03/2007  DT: 02/04/2007  Job #: [id number]

## 2010-10-06 NOTE — Assessment & Plan Note (Signed)
[facility]                         ELECTROPHYSIOLOGY OFFICE NOTE   [name] [name], [name] [name]                          MRN:          [id number]  DATE:03/11/2008                            DOB:          1929-09-25    [name] [name] returns today for followup.  She is a very pleasant elderly  woman with history of symptomatic palpitations and nonsustained SVT who  we placed on flecainide therapy over 10 years ago.  She returns today  for followup, and she has done well.  She has very rare palpitations  which have not been particularly symptomatic over the last year.  She  notes trouble with weight gain, otherwise had no specific complaints  today.   Her medications include:  1. Aspirin 81 mg daily.  2. Flecainide 100 mg in the morning and 15 in the evening.  3. Premarin as directed.   PHYSICAL EXAMINATION:  GENERAL:  She is a pleasant elderly-appearing  woman in no acute distress.  VITAL SIGNS:  Blood pressure today was 136/78, the pulse was 70 and  regular, the respirations were 18, and the weight was 160 pounds.  NECK:  No jugular venous distention.  LUNGS:  Clear bilaterally to auscultation.  No wheezes, rales, or  rhonchi are present.  CARDIOVASCULAR:  Regular rate and rhythm.  Normal S1 and S2.  EXTREMITIES:  Demonstrated no cyanosis, clubbing, or edema.  ABDOMEN:  Soft, nontender.   EKG demonstrates sinus rhythm with normal axis intervals.  The QRS  duration was 88 msec.   IMPRESSION:  1. Symptomatic palpitations and documented nonsustained      supraventricular tachycardia.  2. Nice relief with flecainide therapy.  3. Borderline hypertension.   DISCUSSION:  [name] [name] is stable, and she is maintaining sinus rhythm  very nicely on low-dose flecainide.  I will plan to see her back for  followup in 1 year.     Doylene Canning. Ladona Ridgel, MD  Electronically Signed    GWT/MedQ  DD: 03/11/2008  DT: 03/12/2008  Job #: [id number]

## 2010-10-09 NOTE — Assessment & Plan Note (Signed)
[facility]                           ELECTROPHYSIOLOGY OFFICE NOTE   [name], [name]                          MRN:          [id number]  DATE:01/19/2006                            DOB:          [birth year]    PROGRESS NOTE:  [name] [name] returns to the clinic today for followup. She is a  pleasant 77 year old woman with a history of symptomatic PAC's, who has been  treated very nicely now for nearly 8 years on Flecainide. She denies chest  pain or shortness of breath and has had no palpitations since we last saw  her.   PHYSICAL EXAMINATION:  GENERAL:  She is a pleasant elderly appearing woman  in no distress.  VITAL SIGNS:  Blood pressure 150/80, pulse 75 and regular. Respiratory rate  18. Weight 163 pounds.  NECK:  No jugular venous distention.  LUNGS:  Clear bilaterally to auscultation.  CARDIOVASCULAR:  Regular rate and rhythm. Normal S1 and S2.  EXTREMITIES:  No clubbing, cyanosis, or edema.   LABORATORY DATA:  EKG today demonstrates sinus rhythm with normal axis  intervals.   IMPRESSION:  Symptomatic premature atrial contractions, nicely controlled on  Flecainide.   DISCUSSION:  Overall, [name] [name] is stable. Today, we refilled her Flecainide  and a prescription for p.r.n. Lorazepam and will plan to see her back in the  office in 1 year.                                   Doylene Canning. Ladona Ridgel, MD   GWT/MedQ  DD:  01/19/2006  DT:  01/20/2006  Job #:  [id number]

## 2010-10-09 NOTE — Op Note (Signed)
NAME:  [name] [name], [name] [name]                             ACCOUNT NO.:  [id number]   MEDICAL RECORD NO.:  [id number]                   PATIENT TYPE:  INP   LOCATION:  0460                                 FACILITY:  [facility]   PHYSICIAN:  Katherine Roan, M.D.               DATE OF BIRTH:  [birth year]   DATE OF PROCEDURE:  02/20/2002  DATE OF DISCHARGE:                                 OPERATIVE REPORT   PREOPERATIVE DIAGNOSIS:  Genital prolapse with cystocele, rectocele,  enterocele.   POSTOPERATIVE DIAGNOSIS:  Genital prolapse with cystocele, rectocele,  enterocele.   PROCEDURES:  1. Pelvic examination under anesthesia.  2. Enterocele repair.  3. Anterior repair.  4. Posterior repair.  5. Perineoplasty.  6. Hemorrhoidectomy by Sheppard Plumber. Earlene Plater, M.D.   DESCRIPTION OF PROCEDURE:  The patient was placed in a lithotomy position.  Examination under anesthesia revealed a second degree cystocele with  prolapse to the introitus.  There were no pelvic masses noted, and there was  a large rectocele present.  The patient was then prepped and draped in the  usual fashion.  Foley catheter was inserted.  The apex of the vagina was  identified and the posterior part of the apex was dissected.  The enterocele  sac was identified.  The peritoneal cavity was entered, and the enterocele  was repaired with interrupted sutures of 3-0 Ethibond and 4-0 Vicryl.  The  peritoneum was then closed with a pursestring suture of 3-0 Ethibond.  We  then dissected the pubocervical vaginal fascia from the underlying vagina,  plicated this in the midline with interrupted sutures of 3-0 and 4-0 Vicryl  and 3-0 Ethibond.  A wedge of the vagina anteriorly was then removed and  then the vagina was closed with a submucosal 3-0 and 4-0 Vicryl and a 4-0  Vicryl figure-of-eight suture for the vaginal mucosa.  Following this the  perineoplasty wedge of the perineum was accomplished, and the vagina was  dissected posteriorly from the  underlying prerectal fascia.  The prerectal  fascia was then closed in the midline with interrupted sutures of 3-0 and 4-  0 Vicryl  and 3-0 Ethibond.  The wedge of the vagina was then removed, and  the vagina was closed with a locking suture of 2-0 chromic.  The transverse  perinei muscle was approximated with the bulbocavernosus muscle with  interrupted sutures of 3-0 Vicryl.  The vagina was then closed with a  locking suture of 2-0 Prolene and the perineal skin was closed with the same  suture subcuticularly.  No pack was inserted.  The vagina was irrigated.  Dr. Earlene Plater then performed the hemorrhoidectomy.  Katherine Roan, M.D.    SDM/MEDQ  D:  02/20/2002  T:  02/20/2002  Job:  [id number]   cc:   Sheppard Plumber. Earlene Plater, M.D.  Fax: [phone]

## 2010-10-09 NOTE — Op Note (Signed)
   NAME:  [name] [name], [name] [name]                             ACCOUNT NO.:  [id number]   MEDICAL RECORD NO.:  [id number]                   PATIENT TYPE:  INP   LOCATION:  0460                                 FACILITY:  [facility]   PHYSICIAN:  Timothy E. Earlene Plater, M.D.              DATE OF BIRTH:  04-Feb-1930   DATE OF PROCEDURE:  02/20/2002  DATE OF DISCHARGE:                                 OPERATIVE REPORT   PREOPERATIVE DIAGNOSIS:  Internal hemorrhoid.   POSTOPERATIVE DIAGNOSIS:  Internal hemorrhoid.   OPERATIVE PROCEDURE:  Simple band ligation, internal hemorrhoid.   SURGEON:  Timothy E. Earlene Plater, M.D.   ANESTHESIA:  General.   INDICATIONS FOR PROCEDURE:  [name] has been working with Dr. Kyra Manges  because of enterocele, rectocele, rectal pain.  She does have a single right  anterior third-degree hemorrhoid that occasionally prolapses.  Her surgery  is planned as discussed by Dr. Elana Alm and has been carried out today by  him.  Since she is here, and since she is under anesthesia, we have agreed  to exam under anesthesia and treat hemorrhoids accordingly.   DESCRIPTION OF PROCEDURE:  The patient had been identified, the permit  signed in the preop area.  Dr. Elana Alm had finished his operative procedure,  as noted, separately.  At the conclusion of his procedure, the patient was  stable.  She was in the extended lithotomy position, and the vaginal and  perineal areas were operated and stable.  I gently dilated the rectum,  inserted the operating anoscope, and found a single third-degree right  anterior internal hemorrhoid only.  I placed a band ligation at that  location.  Careful inspection of the other areas revealed there to be no  pathology, and externally there was no pathology.  With this, I completed my  portion of the procedure.  The patient was awakened and taken to the  recovery room, all counts correct, and she was stable.                                               Timothy E.  Earlene Plater, M.D.    TED/MEDQ  D:  02/20/2002  T:  02/20/2002  Job:  [id number]   cc:   S. Kyra Manges, M.D.  [phone] [address]  [place]  [place] [address]  Fax: [phone]

## 2010-10-09 NOTE — Discharge Summary (Signed)
   NAME:  [name] [name], [name] [name]                             ACCOUNT NO.:  [id number]   MEDICAL RECORD NO.:  [id number]                   PATIENT TYPE:  INP   LOCATION:  0460                                 FACILITY[facility]   PHYSICIAN:  Katherine Roan, M.D.               DATE OF BIRTH:  November 21, 1929   DATE OF ADMISSION:  02/20/2002  DATE OF DISCHARGE:  02/22/2002                                 DISCHARGE SUMMARY   ADMISSION DIAGNOSIS:  Pelvic pressure and feeling that things are falling  out with genital prolapse consisting of enterocele, rectocele, and  symptomatic hemorrhoids.   HISTORY OF PRESENT ILLNESS:  The patient is a 77 year old female with pelvic  relaxation and had symptoms of pelvic pain and pelvic discomfort.  She was  also having symptomatic hemorrhoidal bleeding.  She was admitted for pelvic  reconstruction and hemorrhoidectomy by Dr. Earlene Plater.   LABORATORY DATA:  Admission hemoglobin was 12, creatinine was 0.7.   HOSPITAL COURSE:  The patient was admitted to the hospital, underwent a  pelvic examination under anesthesia, enterocele repair, anterior and  posterior repair, perineoplasty, and a hemorrhoidectomy by Dr. Earlene Plater, during  which time he did a simple ligation.  Postoperatively, she did quite well.  She was seen on 02/22/02, without complaints, up and about, voiding  satisfactorily.  She was asked to return to the office in two weeks, and to  avoid constipation.  She will call for fever, bleeding, or any other  difficulties she may encounter.   CONDITION ON DISCHARGE:  Improved.                                                 Katherine Roan, M.D.    SDM/MEDQ  D:  03/08/2002  T:  03/09/2002  Job:  [id number]   cc:   Sheppard Plumber. Earlene Plater, M.D.  Fax: [phone]

## 2010-10-09 NOTE — H&P (Signed)
NAME:  [name] [name], [name] [name] NO.:  [id number]   MEDICAL RECORD NO.:  [id number]                    PATIENT TYPE:   LOCATION:                                       FACILITY:   PHYSICIAN:  Katherine Roan, M.D.               DATE OF BIRTH:   DATE OF ADMISSION:  DATE OF DISCHARGE:                                HISTORY & PHYSICAL   CHIEF COMPLAINT:  Pelvic pressure and feeling like things are falling out,  also painful hemorrhoids.   HISTORY OF PRESENT ILLNESS:  The patient is a 77 year old female status post  hysterectomy in 1971 who presented with genital prolapse consisting of  rectocele and enterocele with pelvic pressure and inability to empty her  bowels correctly. She had a colonoscopy which was normal and was seen by Dr.  Kendrick Ranch who recommended that since we were going to repair the enterocele,  that he would consider removing the external hemorrhoids.   MEDICATIONS:  1. Lorazepam 1 mg daily.  2. She takes an antiarrhythmic drug called flecainide 100 mg b.i.d.  3. She takes aspirin and Premarin.   PAST MEDICAL HISTORY:  She had a breast biopsy in 1950 and 1970 and varicose  veins in 2001.   REVIEW OF SYSTEMS:  HEENT:  She wears glasses but has noted no decrease in  visual or auditory acuity.  CARDIOVASCULAR:  No history of heart attack.  No  history of mitral valve prolapse or heart murmur.  She had a history of  irregular heart rate which is treated with her antiarrhythmic drug.  LUNGS:  No chronic cough, no asthma.  GU:  She has noted no incontinence.  GI:  She  has had no bowel habit change, no weight loss or gain.  No melena.  MUSCLE,  BONES, AND JOINTS:  Unremarkable.   SOCIAL HISTORY:  She is retired and does not smoke or drink.   FAMILY HISTORY:  Her mother is deceased at age 75.  Father died at age 86 of  a CVA.  Her mother was diabetic.  She also has a sister who is diabetic.  Sister is 60.  She has had vascular disease.  She has  one brother who has  vascular disease as well.   PHYSICAL EXAMINATION:  GENERAL APPEARANCE:  A 77 year old female who appears  to be her stated age.  She is oriented to time, place and recent events.  VITAL SIGNS:  Weight is 152, blood pressure 120/80.  HEENT:  Unremarkable.  Oropharynx is not injected.  NECK:  Supple.  Thyroid is not enlarged.  Carotid pulses are equal without  bruits.  No masses are noted in the neck.  The trachea is in the midline.  BREASTS:  No masses or tenderness.  Axilla free from masses.  LUNGS:  Clear to percussion and auscultation.  CARDIOVASCULAR:  Normal sinus rhythm  with no murmurs.  ABDOMEN:  Soft and flat.  Liver, spleen and kidneys are not palpated.  Bowel  sounds are normal.  No tenderness.  GU:  Vulva and vagina normal.  There is a prolapse of the apex suggestive of  enterocele as well as rectocele.  EXTREMITIES:  There is good range of motion and equal pulses and reflexes.    IMPRESSION:  Large enterocele, rectocele all symptomatic and external  hemorrhoids.   PLAN:  Repair of genital prolapse.  Risks, benefits and failure rates have  been discussed with the patient.                                                Katherine Roan, M.D.    SDM/MEDQ  D:  02/19/2002  T:  02/19/2002  Job:  [id number]

## 2010-10-09 NOTE — Op Note (Signed)
[place]  Patient:    [name] [name], [name] [name]                            MRN: [id number] Proc. Date: 05/25/00 Adm. Date:  [id number] Attending:  Alyson Locket                           Operative Report  PREOPERATIVE DIAGNOSIS:  Symptomatic right leg venous varicosities.  POSTOPERATIVE DIAGNOSIS:  Symptomatic right leg venous varicosities.  PROCEDURE:  Ligation and stripping of saphenous vein from below knee to upper thigh segment and removal of tributary varicosities with stabilized avulsion technique.  SURGEON:  Dr. Tawanna Cooler Early.  ASSISTANT: Gershon Crane, P.A.C.  ANESTHESIA:  LMA  COMPLICATIONS:  None.  DISPOSITION:  To recovery room stable.  PROCEDURE IN DETAIL:  The patient was taken to the operating room and placed in position where the area of the right groin and right leg were prepped and draped in the usual sterile fashion.  The patient had been marked while standing prior to induction of anesthesia.  The patient had saphenous varicosities from the mid thigh to the level of the mid calf, and also had tributary varicosities off of this.  Using stab avulsion technique the tributary varicosities were all removed and the incisions were closed with subcuticular 4-0 Vicryl sutures.  The saphenous vein was exposed through the varus in the calf, and a stripper was passed up to the level of the mid thigh.  This was an area of another varicosity.  A separation incision was made over this area and the stripper was removed. The saphenous vein was ligated proximally and distally with Vicryl sutures.  The saphenous vein was stripped upper mid thigh to the mid calf, and pressure was held for hemostasis.  The incisions were all closed with 4-0 subcuticular Vicryl stitch.  Benzoin and Steri-Strips were applied to the incisions and Kerlix and Coban pressure dressing was applied.  The patient tolerated the procedure without immediate complication and was  transferred to the recovery room in stable condition. DD:  05/25/00 TD:  05/25/00 Job: [id number]

## 2010-10-09 NOTE — H&P (Signed)
[place]  Patient:    [name] [name], [name] [name] Visit Number: [id number] MRN: [id number]          Service Type: Attending:  Sonda Primes, M.D. [facility] Dictated by:   Sonda Primes, M.D. [facility] Adm. Date:  02/28/01                           History and Physical  24-HOUR OBSERVATION  DATE OF BIRTH:  1930/01/31  CHIEF COMPLAINT:  Light-headedness, weakness, anxiety, palpitations.  HISTORY OF PRESENT ILLNESS:  The patient is a 77 year old female with history of anxiety and palpitations who presents to the office with complaint of light-headedness, frequent irregular heartbeat, and weakness.  She did not sleep last night because of frequent palpitations.  There is no chest pain.  ALLERGIES:  None.  CURRENT MEDICATIONS: 1. Tambocor 100 mg in the morning, 250 mg at night. 2. Premarin one once a day, 0.625.  ALLERGIES:  No known drug allergies.  PAST MEDICAL HISTORY:  Palpitations, anxiety.  FAMILY HISTORY:  Parents deceased.  SOCIAL HISTORY:  She is a nonsmoker, she is married.  REVIEW OF SYSTEMS:  Problems started a couple days ago.  Descriptions as above.  The rest is negative.  PHYSICAL EXAMINATION:  VITAL SIGNS:  Blood pressure 130/80, pulse 84, temperature 98.6.  GENERAL:  She is in no acute distress, appears anxious.  HEENT:  Moist mucosa.  NECK:  Supple.  No thyromegaly or bruit.  LUNGS:  Clear to auscultation and percussion.  HEART:  Regular S1, S2, occasional irregular beats.  ABDOMEN:  Soft, nontender, no organomegaly, no masses felt.  EXTREMITIES:  Pulses symmetric.  Lower extremities without edema.  NEUROLOGIC:  She is alert, oriented, and cooperative.  LABORATORY:  EKG with supraventricular extra systole.  ASSESSMENT/PLAN: 1. Palpitations. Discontinue all caffeine.  Increase Tambocor to 150 mg    p.o. b.i.d. 2. Near syncope.  Admit for observation for telemetry.  Obtain CPK x 3 and    EKG in the morning.  May need a  cardiology consult. 3. Fatigue, due to problems #1 and #2.  Obtain labs including TSH, CBC,    sedimentation rate, CMET, urinalysis.  Obtain chest x-ray. 4. Hormone replacement therapy.  She was instructed to discontinue Premarin. Dictated by:   Sonda Primes, M.D. [facility] Attending:  Sonda Primes, M.D. [facility] DD:  02/28/01 TD:  02/28/01 Job: [id number]

## 2010-12-07 ENCOUNTER — Telehealth: Payer: Self-pay | Admitting: *Deleted

## 2010-12-07 NOTE — Telephone Encounter (Signed)
Rf req for Lorazepam 1 mg 1/2 po qhs prn. # 40. Last filled 03/2010. Ok to Rf?  Last OV was 07/2008.

## 2010-12-08 NOTE — Telephone Encounter (Signed)
OK to fill this prescription with additional refills x1 Thank you!  

## 2010-12-09 MED ORDER — LORAZEPAM 1 MG PO TABS: 0.5000 mg | ORAL_TABLET | Freq: Every evening | ORAL | Status: DC | PRN

## 2011-01-28 ENCOUNTER — Ambulatory Visit (INDEPENDENT_AMBULATORY_CARE_PROVIDER_SITE_OTHER): Payer: Medicare Other | Admitting: Internal Medicine

## 2011-01-28 ENCOUNTER — Encounter: Payer: Self-pay | Admitting: Internal Medicine

## 2011-01-28 VITALS — BP 156/96 | HR 83 | Resp 16 | Ht 67.0 in | Wt 161.0 lb

## 2011-01-28 DIAGNOSIS — I4891 Unspecified atrial fibrillation: Secondary | ICD-10-CM

## 2011-01-28 DIAGNOSIS — I491 Atrial premature depolarization: Secondary | ICD-10-CM

## 2011-01-28 MED ORDER — LORAZEPAM 1 MG PO TABS: 0.5000 mg | ORAL_TABLET | Freq: Every evening | ORAL | Status: DC | PRN

## 2011-01-28 MED ORDER — FLECAINIDE ACETATE 100 MG PO TABS: ORAL_TABLET | ORAL | Status: DC

## 2011-01-28 NOTE — Assessment & Plan Note (Signed)
Her blood pressure is elevated today but she states that at home it appears to be well controlled. She will continue her current medical therapy.

## 2011-01-28 NOTE — Assessment & Plan Note (Signed)
The patient's symptoms appear to be well controlled on medical therapy. I made no change in her medications today. She will see [place] back in several months.

## 2011-01-28 NOTE — Progress Notes (Signed)
HPI [name] [name] returns today for followup. She is a pleasant 77 year old woman with a history of symptomatic nonsustained SVT due to atrial tachycardia. The patient has been on flecainide therapy for many years. She has very minimal symptoms on medications. She notes that she has gained some weight in the last year because she has been under increasing stress. She has had multiple family members either die or get ill. She admits to dietary indiscretion. She denies PND or orthopnea. No chest pain. No syncope. No Known Allergies   Current Outpatient Prescriptions  Medication Sig Dispense Refill  . Cholecalciferol (VITAMIN D3) 1000 UNITS CAPS Take by mouth as directed. 2 po daily       . flecainide (TAMBOCOR) 100 MG tablet Take 100 mg by mouth as directed. 1 po  qam and 1/2 po qpm       . LORazepam (ATIVAN) 1 MG tablet Take 0.5 tablets (0.5 mg total) by mouth at bedtime as needed for anxiety.  40 tablet  1     Past Medical History  Diagnosis Date  . Anemia     iron def  . Anxiety   . History of colonic polyps     cecal  . Osteoporosis   . Hypertension   . Arrhythmia     paroxysmal SVT and symtomatic PAC's  . GERD (gastroesophageal reflux disease)   . Stricture esophagus   . Diverticulosis     colon  . Vitamin D deficiency     2010    ROS:   All systems reviewed and negative except as noted in the HPI.   Past Surgical History  Procedure Date  . Rectocele repair     s/p 2003  . Hemorrhoid surgery   . Breast reduction surgery     s/p 1996     Family History  Problem Relation Age of Onset  . Lung cancer      2 cousins  . Colon cancer      remote relative  . Stroke Father     @ 15  . Hypertension Sister   . Kidney disease      brother     History   Social History  . Marital Status: Married    Spouse Name: N/A    Number of Children: N/A  . Years of Education: N/A   Occupational History  . Retired    Social History Main Topics  . Smoking status: Former  Games developer  . Smokeless tobacco: Not on file  . Alcohol Use: Not on file  . Drug Use: Not on file  . Sexually Active: Not on file   Other Topics Concern  . Not on file   Social History Narrative  . No narrative on file     BP 156/96  Pulse 83  Resp 16  Ht 5\' 7"  (1.702 m)  Wt 161 lb (73.029 kg)  BMI 25.22 kg/m2  Physical Exam:  Well appearing elderly woman, NAD HEENT: Unremarkable Neck:  No JVD, no thyromegally Lymphatics:  No adenopathy Back:  No CVA tenderness Lungs:  Clear with no wheezes, rales, or rhonchi. HEART:  Regular rate rhythm, no murmurs, no rubs, no clicks Abd:  soft, positive bowel sounds, no organomegally, no rebound, no guarding Ext:  2 plus pulses, no edema, no cyanosis, no clubbing Skin:  No rashes no nodules Neuro:  CN II through XII intact, motor grossly intact  EKG Normal sinus rhythm  Assess/Plan:

## 2011-01-28 NOTE — Patient Instructions (Signed)
Your physician recommends that you schedule a follow-up appointment in: YEAR WITH DR TAYLOR Your physician recommends that you continue on your current medications as directed. Please refer to the Current Medication list given to you today. 

## 2011-06-22 ENCOUNTER — Telehealth: Payer: Self-pay | Admitting: *Deleted

## 2011-06-22 NOTE — Telephone Encounter (Signed)
Rf req for Lorazepam. 1/2 tab po prn sleep. #40 Ok to Rf?

## 2011-06-22 NOTE — Telephone Encounter (Signed)
OK to fill this prescription with additional refills x1. Pls sch OV Thank you!

## 2011-06-23 MED ORDER — LORAZEPAM 1 MG PO TABS: 0.5000 mg | ORAL_TABLET | Freq: Every evening | ORAL | Status: DC | PRN

## 2011-07-12 ENCOUNTER — Encounter: Payer: Self-pay | Admitting: Gynecology

## 2011-07-12 ENCOUNTER — Ambulatory Visit (INDEPENDENT_AMBULATORY_CARE_PROVIDER_SITE_OTHER): Payer: Medicare Other | Admitting: Gynecology

## 2011-07-12 VITALS — BP 118/82 | Ht 65.25 in | Wt 158.0 lb

## 2011-07-12 DIAGNOSIS — R82998 Other abnormal findings in urine: Secondary | ICD-10-CM | POA: Diagnosis not present

## 2011-07-12 DIAGNOSIS — N9489 Other specified conditions associated with female genital organs and menstrual cycle: Secondary | ICD-10-CM

## 2011-07-12 DIAGNOSIS — N644 Mastodynia: Secondary | ICD-10-CM | POA: Diagnosis not present

## 2011-07-12 DIAGNOSIS — M810 Age-related osteoporosis without current pathological fracture: Secondary | ICD-10-CM

## 2011-07-12 DIAGNOSIS — N949 Unspecified condition associated with female genital organs and menstrual cycle: Secondary | ICD-10-CM

## 2011-07-12 DIAGNOSIS — N390 Urinary tract infection, site not specified: Secondary | ICD-10-CM

## 2011-07-12 DIAGNOSIS — N952 Postmenopausal atrophic vaginitis: Secondary | ICD-10-CM | POA: Diagnosis not present

## 2011-07-12 DIAGNOSIS — N898 Other specified noninflammatory disorders of vagina: Secondary | ICD-10-CM | POA: Diagnosis not present

## 2011-07-12 LAB — URINALYSIS W MICROSCOPIC + REFLEX CULTURE
Bilirubin Urine: NEGATIVE
Casts: NONE SEEN
Crystals: NONE SEEN
Glucose, UA: NEGATIVE mg/dL
Ketones, ur: NEGATIVE mg/dL
Leukocytes, UA: NEGATIVE
Nitrite: NEGATIVE
Protein, ur: NEGATIVE mg/dL
Specific Gravity, Urine: 1.01 (ref 1.005–1.030)
Urobilinogen, UA: 0.2 mg/dL (ref 0.0–1.0)
pH: 7 (ref 5.0–8.0)

## 2011-07-12 LAB — WET PREP FOR TRICH, YEAST, CLUE
Clue Cells Wet Prep HPF POC: NONE SEEN
Trich, Wet Prep: NONE SEEN
Yeast Wet Prep HPF POC: NONE SEEN

## 2011-07-12 MED ORDER — FLUCONAZOLE 150 MG PO TABS: 150.0000 mg | ORAL_TABLET | Freq: Once | ORAL | Status: AC

## 2011-07-12 MED ORDER — NYSTATIN-TRIAMCINOLONE 100000-0.1 UNIT/GM-% EX OINT: TOPICAL_OINTMENT | Freq: Two times a day (BID) | CUTANEOUS | Status: DC

## 2011-07-12 MED ORDER — SULFAMETHOXAZOLE-TRIMETHOPRIM 800-160 MG PO TABS: 1.0000 | ORAL_TABLET | Freq: Two times a day (BID) | ORAL | Status: AC

## 2011-07-12 MED ORDER — NITROFURANTOIN MONOHYD MACRO 100 MG PO CAPS: 100.0000 mg | ORAL_CAPSULE | Freq: Two times a day (BID) | ORAL | Status: DC

## 2011-07-12 NOTE — Progress Notes (Signed)
[name] [name] November 27, 1929 [id number]        78 y.o. G2 P2 New patient with several issues noted below.  Past medical history,surgical history, medications, allergies, family history and social history were all reviewed and documented in the EPIC chart. ROS:  Was performed and pertinent positives and negatives are included in the history.  Exam: Sherrilyn Rist chaperone present Filed Vitals:   07/12/11 1212  BP: 118/82   General appearance  Normal Skin grossly normal Head/Neck normal with no cervical or supraclavicular adenopathy thyroid normal Lungs  clear Cardiac RR, without RMG Abdominal  soft, nontender, without masses, organomegaly or hernia Breasts  examined lying and sitting without masses, retractions, discharge or axillary adenopathy.  Well-healed scar right tail of Spence. Well-healed reduction scars Pelvic  Ext/BUS/vagina  normal with atrophic changes no gross evidence of cystocele rectocele or enterocele with Valsalva  Adnexa  Without masses or tenderness    Anus and perineum  normal   Rectovaginal  normal sphincter tone without palpated masses or tenderness.    Assessment/Plan:  78 y.o. female several issues noted below. 1. History of feeling something coming out of the vagina this morning while bathing. Patient notes some lower pelvic pressure most recently.  She is status post rectocele repair a number of years ago by Dr. Kyra Manges. Said the this reminds her of the symptoms she was having before. Describes it as an egg size bulging at the vaginal opening. Exam today is normal without significant rectocele no evidence of any bulging with Valsalva. Reviewed with patient, she will monitor for now and if she has recurrence she'll come in ASAP while she's feeling bulging so I can reexamine her and go from there. If indeed she has a recurrent rectocele or enterocele possible pessary use versus surgery was discussed. 2. Slight dysuria starting over the last one day. UA is low-grade positive.  Will treat with Septra DS 1 by mouth twice a day x3 days. 3. Vulvar pruritus.  Does have a slight white discharge. No overt vulvar changes other than atrophic changes. Wet prep is unremarkable. I am going to cover her for yeast with Diflucan 150x1 dose and I wrote her prescription for Mycolog cream to apply externally as needed for itching. If symptoms persist may consider biopsy but again there is no classic changes to suggest lichen sclerosus or other pathology. 4. Osteoporosis. She has a history of osteoporosis in her chart but she states that she was never told that she had this and denies ever having a DEXA study. I ordered a DEXA today and she will follow up with me for this. Increase calcium vitamin D reviewed. 5. Mammography. Patient's several years late for her mammogram and she knows importance of doing so. She has a daughter who died of breast cancer and I stressed the need to get her mammogram she agrees to do so. SBE monthly reviewed. 6. Pap smear. I reviewed current screening guidelines. She has no history of abnormal Pap smears in the past and since she is greater than 65 and status post hysterectomy we'll stop doing Pap smears altogether and she agrees with this. 7. Colonoscopy. Patient reports having one within the past 10 years. She'll follow up at their recommended repeat schedule. 8. Health maintenance. No blood work or other routine studies were ordered and she actively sees her primary physician will continue to do so and have this done through their office.   Dara Lords MD, 12:54 PM 07/12/2011

## 2011-07-12 NOTE — Patient Instructions (Signed)
Follow up for bone density study as scheduled. Follow up if vaginal bulging recurs or persists. Follow up if vaginal burning persists.

## 2011-07-14 LAB — URINE CULTURE
Colony Count: NO GROWTH
Organism ID, Bacteria: NO GROWTH

## 2011-07-15 ENCOUNTER — Encounter: Payer: Self-pay | Admitting: Gynecology

## 2011-07-16 ENCOUNTER — Encounter: Payer: Self-pay | Admitting: Gynecology

## 2011-07-19 ENCOUNTER — Other Ambulatory Visit (INDEPENDENT_AMBULATORY_CARE_PROVIDER_SITE_OTHER): Payer: Medicare Other

## 2011-07-19 ENCOUNTER — Ambulatory Visit (INDEPENDENT_AMBULATORY_CARE_PROVIDER_SITE_OTHER): Payer: Medicare Other | Admitting: Internal Medicine

## 2011-07-19 ENCOUNTER — Encounter: Payer: Self-pay | Admitting: Internal Medicine

## 2011-07-19 VITALS — BP 150/90 | HR 80 | Temp 97.0°F | Resp 16 | Wt 159.0 lb

## 2011-07-19 DIAGNOSIS — R202 Paresthesia of skin: Secondary | ICD-10-CM

## 2011-07-19 DIAGNOSIS — F4321 Adjustment disorder with depressed mood: Secondary | ICD-10-CM

## 2011-07-19 DIAGNOSIS — Z79899 Other long term (current) drug therapy: Secondary | ICD-10-CM | POA: Diagnosis not present

## 2011-07-19 DIAGNOSIS — G459 Transient cerebral ischemic attack, unspecified: Secondary | ICD-10-CM

## 2011-07-19 DIAGNOSIS — R209 Unspecified disturbances of skin sensation: Secondary | ICD-10-CM | POA: Diagnosis not present

## 2011-07-19 DIAGNOSIS — G319 Degenerative disease of nervous system, unspecified: Secondary | ICD-10-CM | POA: Diagnosis not present

## 2011-07-19 DIAGNOSIS — M810 Age-related osteoporosis without current pathological fracture: Secondary | ICD-10-CM

## 2011-07-19 DIAGNOSIS — Z8673 Personal history of transient ischemic attack (TIA), and cerebral infarction without residual deficits: Secondary | ICD-10-CM | POA: Insufficient documentation

## 2011-07-19 DIAGNOSIS — F411 Generalized anxiety disorder: Secondary | ICD-10-CM | POA: Diagnosis not present

## 2011-07-19 DIAGNOSIS — I10 Essential (primary) hypertension: Secondary | ICD-10-CM

## 2011-07-19 DIAGNOSIS — R51 Headache: Secondary | ICD-10-CM | POA: Diagnosis not present

## 2011-07-19 LAB — CBC WITH DIFFERENTIAL/PLATELET
Basophils Absolute: 0 10*3/uL (ref 0.0–0.1)
Basophils Relative: 0.7 % (ref 0.0–3.0)
Eosinophils Absolute: 0.1 10*3/uL (ref 0.0–0.7)
Eosinophils Relative: 0.9 % (ref 0.0–5.0)
HCT: 40.8 % (ref 36.0–46.0)
Hemoglobin: 13.7 g/dL (ref 12.0–15.0)
Lymphocytes Relative: 32.5 % (ref 12.0–46.0)
Lymphs Abs: 1.8 10*3/uL (ref 0.7–4.0)
MCHC: 33.5 g/dL (ref 30.0–36.0)
MCV: 93.8 fl (ref 78.0–100.0)
Monocytes Absolute: 0.4 10*3/uL (ref 0.1–1.0)
Monocytes Relative: 7.4 % (ref 3.0–12.0)
Neutro Abs: 3.2 10*3/uL (ref 1.4–7.7)
Neutrophils Relative %: 58.5 % (ref 43.0–77.0)
Platelets: 316 10*3/uL (ref 150.0–400.0)
RBC: 4.35 Mil/uL (ref 3.87–5.11)
RDW: 14 % (ref 11.5–14.6)
WBC: 5.5 10*3/uL (ref 4.5–10.5)

## 2011-07-19 LAB — URINALYSIS
Bilirubin Urine: NEGATIVE
Ketones, ur: NEGATIVE
Leukocytes, UA: NEGATIVE
Nitrite: NEGATIVE
Specific Gravity, Urine: 1.015 (ref 1.000–1.030)
Total Protein, Urine: NEGATIVE
Urine Glucose: NEGATIVE
Urobilinogen, UA: 0.2 (ref 0.0–1.0)
pH: 6 (ref 5.0–8.0)

## 2011-07-19 LAB — HEPATIC FUNCTION PANEL
ALT: 14 U/L (ref 0–35)
AST: 19 U/L (ref 0–37)
Albumin: 4.6 g/dL (ref 3.5–5.2)
Alkaline Phosphatase: 73 U/L (ref 39–117)
Bilirubin, Direct: 0.1 mg/dL (ref 0.0–0.3)
Total Bilirubin: 0.4 mg/dL (ref 0.3–1.2)
Total Protein: 7.5 g/dL (ref 6.0–8.3)

## 2011-07-19 LAB — LIPID PANEL
Cholesterol: 204 mg/dL — ABNORMAL HIGH (ref 0–200)
HDL: 59.2 mg/dL (ref >39.00)
Total CHOL/HDL Ratio: 3
Triglycerides: 169 mg/dL — ABNORMAL HIGH (ref 0.0–149.0)
VLDL: 33.8 mg/dL (ref 0.0–40.0)

## 2011-07-19 LAB — BASIC METABOLIC PANEL
BUN: 15 mg/dL (ref 6–23)
CO2: 30 mEq/L (ref 19–32)
Calcium: 9.7 mg/dL (ref 8.4–10.5)
Chloride: 104 mEq/L (ref 96–112)
Creatinine, Ser: 0.8 mg/dL (ref 0.4–1.2)
GFR: 75.63 mL/min (ref >60.00)
Glucose, Bld: 93 mg/dL (ref 70–99)
Potassium: 4.1 mEq/L (ref 3.5–5.1)
Sodium: 141 mEq/L (ref 135–145)

## 2011-07-19 LAB — TSH: TSH: 3.28 u[IU]/mL (ref 0.35–5.50)

## 2011-07-19 LAB — LDL CHOLESTEROL, DIRECT: Direct LDL: 108.9 mg/dL

## 2011-07-19 LAB — SEDIMENTATION RATE: Sed Rate: 26 mm/hr — ABNORMAL HIGH (ref 0–22)

## 2011-07-19 LAB — VITAMIN B12: Vitamin B-12: 409 pg/mL (ref 211–911)

## 2011-07-19 MED ORDER — ASCRIPTIN 325 MG PO TABS: ORAL_TABLET | ORAL | Status: DC

## 2011-07-19 MED ORDER — LORAZEPAM 1 MG PO TABS: 0.5000 mg | ORAL_TABLET | Freq: Every evening | ORAL | Status: DC | PRN

## 2011-07-19 MED ORDER — LOSARTAN POTASSIUM 100 MG PO TABS: 100.0000 mg | ORAL_TABLET | Freq: Every day | ORAL | Status: DC

## 2011-07-19 NOTE — Progress Notes (Signed)
  Subjective:    Patient ID: [name] [name], female    DOB: June 21, 1929, 78 y.o.   MRN: [id number]  HPI  Not seen in 3 years  C/o numbness in L face and L hand x 2 (last 2 wks ago) lasting x 20 min  The patient presents for a follow-up of  chronic hypertension, osteoporosis, anxiety (xanax helped)  She has been grieving her dtr died w/breast cancer     BP Readings from Last 3 Encounters:  07/19/11 150/90  07/12/11 118/82  01/28/11 156/96      Review of Systems  Constitutional: Negative for chills, activity change, appetite change, fatigue and unexpected weight change.  HENT: Negative for congestion, mouth sores and sinus pressure.   Eyes: Negative for visual disturbance.  Respiratory: Negative for cough and chest tightness.   Cardiovascular: Negative for leg swelling.  Gastrointestinal: Negative for nausea and abdominal pain.  Genitourinary: Negative for frequency, difficulty urinating and vaginal pain.  Musculoskeletal: Negative for back pain and gait problem.  Skin: Negative for pallor and rash.  Neurological: Negative for dizziness, tremors, weakness, numbness and headaches.  Psychiatric/Behavioral: Negative for suicidal ideas, confusion and sleep disturbance. The patient is nervous/anxious.        Objective:   Physical Exam  Constitutional: She appears well-developed and well-nourished. No distress.  HENT:  Head: Normocephalic.  Right Ear: External ear normal.  Left Ear: External ear normal.  Nose: Nose normal.  Mouth/Throat: Oropharynx is clear and moist.  Eyes: Conjunctivae are normal. Pupils are equal, round, and reactive to light. Right eye exhibits no discharge. Left eye exhibits no discharge.  Neck: Normal range of motion. Neck supple. No JVD present. No tracheal deviation present. No thyromegaly present.  Cardiovascular: Normal rate, regular rhythm and normal heart sounds.   Pulmonary/Chest: No stridor. No respiratory distress. She has no wheezes.  Abdominal:  Soft. Bowel sounds are normal. She exhibits no distension and no mass. There is no tenderness. There is no rebound and no guarding.  Musculoskeletal: She exhibits no edema and no tenderness.  Lymphadenopathy:    She has no cervical adenopathy.  Neurological: She displays normal reflexes. No cranial nerve deficit. She exhibits normal muscle tone. Coordination normal.  Skin: No rash noted. No erythema.       AKs  Psychiatric: She has a normal mood and affect. Her behavior is normal. Judgment and thought content normal.          Assessment & Plan:

## 2011-07-19 NOTE — Assessment & Plan Note (Signed)
Discussed.

## 2011-07-19 NOTE — Assessment & Plan Note (Signed)
Diet controlled - will watch

## 2011-07-19 NOTE — Assessment & Plan Note (Signed)
CT head Labs, incl B12 Carot [place]

## 2011-07-19 NOTE — Assessment & Plan Note (Signed)
Vit D 

## 2011-07-19 NOTE — Assessment & Plan Note (Signed)
Increase ASA B12, ESR, carot doppler

## 2011-07-19 NOTE — Assessment & Plan Note (Signed)
Situational  Potential benefits of a short/long term benzodiazepines  use as well as potential risks  and complications were explained to the patient and were aknowledged. Continue with current prescription therapy as reflected on the Med list.

## 2011-07-21 ENCOUNTER — Ambulatory Visit (INDEPENDENT_AMBULATORY_CARE_PROVIDER_SITE_OTHER): Payer: Medicare Other

## 2011-07-21 ENCOUNTER — Other Ambulatory Visit: Payer: Self-pay | Admitting: Cardiology

## 2011-07-21 ENCOUNTER — Other Ambulatory Visit: Payer: Self-pay | Admitting: Gynecology

## 2011-07-21 ENCOUNTER — Ambulatory Visit (INDEPENDENT_AMBULATORY_CARE_PROVIDER_SITE_OTHER)
Admission: RE | Admit: 2011-07-21 | Discharge: 2011-07-21 | Disposition: A | Discharge status: Home / Self Care | Payer: Medicare Other | Source: Ambulatory Visit | Attending: Cardiovascular Disease | Admitting: Cardiovascular Disease

## 2011-07-21 DIAGNOSIS — M810 Age-related osteoporosis without current pathological fracture: Secondary | ICD-10-CM

## 2011-07-21 DIAGNOSIS — I10 Essential (primary) hypertension: Secondary | ICD-10-CM | POA: Diagnosis not present

## 2011-07-21 DIAGNOSIS — R209 Unspecified disturbances of skin sensation: Secondary | ICD-10-CM

## 2011-07-21 DIAGNOSIS — G459 Transient cerebral ischemic attack, unspecified: Secondary | ICD-10-CM | POA: Diagnosis not present

## 2011-07-22 ENCOUNTER — Telehealth: Payer: Self-pay | Admitting: Gynecology

## 2011-07-22 ENCOUNTER — Encounter: Payer: Self-pay | Admitting: Gynecology

## 2011-07-22 NOTE — Telephone Encounter (Signed)
Tell patient that her bone density does show osteoporosis and she is to make an appointment with me to discuss treatment options.

## 2011-07-22 NOTE — Telephone Encounter (Signed)
Pt informed with the below note and will call back to make appointment. 

## 2011-07-26 ENCOUNTER — Encounter (INDEPENDENT_AMBULATORY_CARE_PROVIDER_SITE_OTHER): Payer: Medicare Other

## 2011-07-26 DIAGNOSIS — R209 Unspecified disturbances of skin sensation: Secondary | ICD-10-CM | POA: Diagnosis not present

## 2011-07-26 DIAGNOSIS — G459 Transient cerebral ischemic attack, unspecified: Secondary | ICD-10-CM

## 2011-07-28 ENCOUNTER — Telehealth: Payer: Self-pay | Admitting: Internal Medicine

## 2011-07-28 MED ORDER — CEFUROXIME AXETIL 500 MG PO TABS: 500.0000 mg | ORAL_TABLET | Freq: Two times a day (BID) | ORAL | Status: AC

## 2011-07-28 NOTE — Telephone Encounter (Signed)
[name] [name], please, inform patient that her head CT showed sinusitis. If sx  -- pls call in Ceftin 500 mg bid x 2 wks Thx

## 2011-07-28 NOTE — Telephone Encounter (Signed)
Pt informed. She c/o sinus drainage. Rx sent.

## 2011-07-29 ENCOUNTER — Ambulatory Visit (INDEPENDENT_AMBULATORY_CARE_PROVIDER_SITE_OTHER): Payer: Medicare Other | Admitting: Gynecology

## 2011-07-29 ENCOUNTER — Encounter: Payer: Self-pay | Admitting: Gynecology

## 2011-07-29 DIAGNOSIS — M810 Age-related osteoporosis without current pathological fracture: Secondary | ICD-10-CM

## 2011-07-29 DIAGNOSIS — E559 Vitamin D deficiency, unspecified: Secondary | ICD-10-CM | POA: Diagnosis not present

## 2011-07-29 MED ORDER — RISEDRONATE SODIUM 150 MG PO TABS: 150.0000 mg | ORAL_TABLET | ORAL | Status: DC

## 2011-07-29 NOTE — Patient Instructions (Signed)
Start on Actonel 150 mg monthly. Follow instructions that come with the medication. Let me know if you have any issues on starting this.

## 2011-07-29 NOTE — Progress Notes (Signed)
Patient presents to discuss her recent DEXA which shows osteoporosis with a -2.7 T score. She recently had a checkup at Dr. Posey Rea office which he had lab work drawn which showed a normal thyroid. She is known to be low on vitamin D with the last vitamin D check in 2010 showing 21.  I reviewed the DEXA results with her in detail and reviewed various treatment options with her. I discussed bisphosphonates, oral versus IV, Prolia, Evista, Forteo. Risks/benefits side effects profiles of each choice reviewed and ultimately we decided on a trial of oral bisphosphonates Actonel 150 mg monthly. Instructions on how to take the medication were reviewed the side effect profile to include reflux as well as long-term risks to include esophageal cancer, osteonecrosis of the jaw atypical fractures all discussed. Patient will start this and let me know if she has any issues but otherwise will follow up in a year. I did order a vitamin D level today to see where we stand.

## 2011-07-30 LAB — VITAMIN D 25 HYDROXY (VIT D DEFICIENCY, FRACTURES): Vit D, 25-Hydroxy: 33 ng/mL (ref 30–89)

## 2011-08-13 ENCOUNTER — Ambulatory Visit: Payer: Medicare Other | Admitting: Internal Medicine

## 2011-10-08 ENCOUNTER — Ambulatory Visit (INDEPENDENT_AMBULATORY_CARE_PROVIDER_SITE_OTHER): Payer: Medicare Other | Admitting: Internal Medicine

## 2011-10-08 ENCOUNTER — Encounter: Payer: Self-pay | Admitting: Internal Medicine

## 2011-10-08 VITALS — BP 148/88 | HR 80 | Temp 98.1°F | Resp 16 | Wt 160.0 lb

## 2011-10-08 DIAGNOSIS — G459 Transient cerebral ischemic attack, unspecified: Secondary | ICD-10-CM | POA: Diagnosis not present

## 2011-10-08 DIAGNOSIS — Z1239 Encounter for other screening for malignant neoplasm of breast: Secondary | ICD-10-CM

## 2011-10-08 DIAGNOSIS — I10 Essential (primary) hypertension: Secondary | ICD-10-CM | POA: Diagnosis not present

## 2011-10-08 DIAGNOSIS — N644 Mastodynia: Secondary | ICD-10-CM

## 2011-10-08 DIAGNOSIS — F411 Generalized anxiety disorder: Secondary | ICD-10-CM

## 2011-10-08 DIAGNOSIS — F4321 Adjustment disorder with depressed mood: Secondary | ICD-10-CM

## 2011-10-08 MED ORDER — LOSARTAN POTASSIUM 100 MG PO TABS: 100.0000 mg | ORAL_TABLET | Freq: Every day | ORAL | Status: DC

## 2011-10-08 NOTE — Patient Instructions (Signed)
Check BP at home

## 2011-10-08 NOTE — Assessment & Plan Note (Signed)
Continue with current prescription therapy as reflected on the Med list.  

## 2011-10-08 NOTE — Assessment & Plan Note (Signed)
Cont ASA

## 2011-10-08 NOTE — Assessment & Plan Note (Signed)
Better  

## 2011-10-08 NOTE — Progress Notes (Signed)
Patient ID: [name] [name], female   DOB: 03-04-1930, 78 y.o.   MRN: [id number]  Subjective:    Patient ID: [name] [name], female    DOB: 1929-12-08, 78 y.o.   MRN: [id number]  HPI  Not seen in 3 years  F/u numbness in L face and L hand x 2 (last 2 wks ago) lasting x 20 min - no relapse  The patient presents for a follow-up of  chronic hypertension, TIA, osteoporosis, anxiety (xanax helped)  She has been grieving her dtr died w/breast cancer     BP Readings from Last 3 Encounters:  10/08/11 148/88  07/19/11 150/90  07/12/11 118/82    Wt Readings from Last 3 Encounters:  10/08/11 160 lb (72.576 kg)  07/19/11 159 lb (72.122 kg)  07/12/11 158 lb (71.668 kg)      Review of Systems  Constitutional: Negative for chills, activity change, appetite change, fatigue and unexpected weight change.  HENT: Negative for congestion, mouth sores and sinus pressure.   Eyes: Negative for visual disturbance.  Respiratory: Negative for cough and chest tightness.   Cardiovascular: Negative for leg swelling.  Gastrointestinal: Negative for nausea and abdominal pain.  Genitourinary: Negative for frequency, difficulty urinating and vaginal pain.  Musculoskeletal: Negative for back pain and gait problem.  Skin: Negative for pallor and rash.  Neurological: Negative for dizziness, tremors, weakness, numbness and headaches.  Psychiatric/Behavioral: Negative for suicidal ideas, confusion and sleep disturbance. The patient is nervous/anxious.        Objective:   Physical Exam  Constitutional: She appears well-developed and well-nourished. No distress.  HENT:  Head: Normocephalic.  Right Ear: External ear normal.  Left Ear: External ear normal.  Nose: Nose normal.  Mouth/Throat: Oropharynx is clear and moist.  Eyes: Conjunctivae are normal. Pupils are equal, round, and reactive to light. Right eye exhibits no discharge. Left eye exhibits no discharge.  Neck: Normal range of motion. Neck supple. No JVD  present. No tracheal deviation present. No thyromegaly present.  Cardiovascular: Normal rate, regular rhythm and normal heart sounds.   Pulmonary/Chest: No stridor. No respiratory distress. She has no wheezes.  Abdominal: Soft. Bowel sounds are normal. She exhibits no distension and no mass. There is no tenderness. There is no rebound and no guarding.  Musculoskeletal: She exhibits no edema and no tenderness.  Lymphadenopathy:    She has no cervical adenopathy.  Neurological: She displays normal reflexes. No cranial nerve deficit. She exhibits normal muscle tone. Coordination normal.  Skin: No rash noted. No erythema.       AKs  Psychiatric: She has a normal mood and affect. Her behavior is normal. Judgment and thought content normal.     Lab Results  Component Value Date   WBC 5.5 07/19/2011   HGB 13.7 07/19/2011   HCT 40.8 07/19/2011   PLT 316.0 07/19/2011   GLUCOSE 93 07/19/2011   CHOL 204* 07/19/2011   TRIG 169.0* 07/19/2011   HDL 59.20 07/19/2011   LDLDIRECT 108.9 07/19/2011   ALT 14 07/19/2011   AST 19 07/19/2011   NA 141 07/19/2011   K 4.1 07/19/2011   CL 104 07/19/2011   CREATININE 0.8 07/19/2011   BUN 15 07/19/2011   CO2 30 07/19/2011   TSH 3.28 07/19/2011        Assessment & Plan:

## 2011-10-08 NOTE — Assessment & Plan Note (Signed)
Restart Cozaar 1 a day

## 2011-11-05 ENCOUNTER — Other Ambulatory Visit: Payer: Self-pay | Admitting: Internal Medicine

## 2011-11-05 ENCOUNTER — Ambulatory Visit
Admission: RE | Admit: 2011-11-05 | Discharge: 2011-11-05 | Disposition: A | Discharge status: Home / Self Care | Payer: Medicare Other | Source: Ambulatory Visit | Attending: Internal Medicine | Admitting: Internal Medicine

## 2011-11-05 ENCOUNTER — Ambulatory Visit: Admission: RE | Admit: 2011-11-05 | Payer: Medicare Other | Source: Ambulatory Visit

## 2011-11-05 DIAGNOSIS — N644 Mastodynia: Secondary | ICD-10-CM | POA: Diagnosis not present

## 2011-11-05 DIAGNOSIS — Z1239 Encounter for other screening for malignant neoplasm of breast: Secondary | ICD-10-CM

## 2011-11-05 DIAGNOSIS — Z853 Personal history of malignant neoplasm of breast: Secondary | ICD-10-CM

## 2011-11-15 DIAGNOSIS — Z85828 Personal history of other malignant neoplasm of skin: Secondary | ICD-10-CM | POA: Diagnosis not present

## 2011-11-15 DIAGNOSIS — L821 Other seborrheic keratosis: Secondary | ICD-10-CM | POA: Diagnosis not present

## 2011-11-15 DIAGNOSIS — L570 Actinic keratosis: Secondary | ICD-10-CM | POA: Diagnosis not present

## 2011-11-15 DIAGNOSIS — C44319 Basal cell carcinoma of skin of other parts of face: Secondary | ICD-10-CM | POA: Diagnosis not present

## 2011-11-15 DIAGNOSIS — D485 Neoplasm of uncertain behavior of skin: Secondary | ICD-10-CM | POA: Diagnosis not present

## 2011-12-21 DIAGNOSIS — L570 Actinic keratosis: Secondary | ICD-10-CM | POA: Diagnosis not present

## 2011-12-21 DIAGNOSIS — C44319 Basal cell carcinoma of skin of other parts of face: Secondary | ICD-10-CM | POA: Diagnosis not present

## 2011-12-24 ENCOUNTER — Ambulatory Visit (INDEPENDENT_AMBULATORY_CARE_PROVIDER_SITE_OTHER): Payer: Medicare Other | Admitting: Endocrinology

## 2011-12-24 ENCOUNTER — Encounter: Payer: Self-pay | Admitting: Endocrinology

## 2011-12-24 ENCOUNTER — Telehealth: Payer: Self-pay | Admitting: Internal Medicine

## 2011-12-24 VITALS — BP 128/82 | HR 74 | Temp 97.4°F | Wt 165.0 lb

## 2011-12-24 DIAGNOSIS — J309 Allergic rhinitis, unspecified: Secondary | ICD-10-CM | POA: Diagnosis not present

## 2011-12-24 NOTE — Progress Notes (Signed)
Subjective:    Patient ID: [name] [name], female    DOB: 10-05-1929, 78 y.o.   MRN: [id number]  HPI Pt states few mos of congestion in the nose, and assoc rhinorrhea.   Past Medical History  Diagnosis Date  . Anemia     iron def  . Anxiety   . History of colonic polyps     cecal  . Osteoporosis 07/21/2011    t score -2.7  . Hypertension   . Arrhythmia     paroxysmal SVT and symtomatic PAC's  . GERD (gastroesophageal reflux disease)   . Stricture esophagus   . Diverticulosis     colon  . Vitamin d deficiency     2010    Past Surgical History  Procedure Date  . A&p, enterocele repair     s/p 2003  . Hemorrhoid surgery   . Breast reduction surgery     s/p 1996  . Abdominal hysterectomy     History   Social History  . Marital Status: Married    Spouse Name: N/A    Number of Children: N/A  . Years of Education: N/A   Occupational History  . Retired    Social History Main Topics  . Smoking status: Former Smoker    Quit date: 05/24/1968  . Smokeless tobacco: Never Used   Comment: only smoked 7 years  . Alcohol Use: No  . Drug Use: No  . Sexually Active: No   Other Topics Concern  . Not on file   Social History Narrative  . No narrative on file    Current Outpatient Prescriptions on File Prior to Visit  Medication Sig Dispense Refill  . Cholecalciferol (VITAMIN D3) 1000 UNITS CAPS Take by mouth as directed. 2 po daily       . flecainide (TAMBOCOR) 100 MG tablet 1 TAB EVERY AM 1/2 TAB EVERY PM  135 tablet  3  . LORazepam (ATIVAN) 1 MG tablet Take 0.5 tablets (0.5 mg total) by mouth at bedtime as needed for anxiety.  30 tablet  2  . Aspirin Buf,AlHyd-MgHyd-CaCar, (ASCRIPTIN) 325 MG TABS 1 po qd pc  100 each  3  . losartan (COZAAR) 100 MG tablet Take 1 tablet (100 mg total) by mouth daily.  30 tablet  11    No Known Allergies  Family History  Problem Relation Age of Onset  . Lung cancer      2 cousins  . Colon cancer      remote relative  . Kidney  disease      brother  . Stroke Father     @ 83  . Hypertension Father   . Hypertension Sister   . Cancer Sister 23    brain  . Stroke Mother     medication induced  . Breast cancer Other 42    breast  . Breast cancer Daughter   . Cancer Daughter 52    breast  . Cancer Other 58    colon/MELANOMA    BP 128/82  Pulse 74  Temp 97.4 F (36.3 C) (Oral)  Wt 165 lb (74.844 kg)  SpO2 96%  LMP 05/24/1970  Review of Systems Denies fever, but she has decreased hearing in the left ear    Objective:   Physical Exam VITAL SIGNS:  See vs page GENERAL: no distress head: no deformity eyes: no periorbital swelling, no proptosis external nose and ears are normal mouth: no lesion seen Left eac: occluded with cerumen  Intervention: left eac  is irrigated Repeat exam is normal       Assessment & Plan:  Cerumen impaction, new.  Better with irrigation Allergic rhinitis, persitent

## 2011-12-24 NOTE — Patient Instructions (Addendum)
Here is a sample bottle of "Q-nasal."  Take 1 spray each side daily.  Call if this helps, and i would be happy to prescribe a similar generic spray bottle.

## 2011-12-24 NOTE — Telephone Encounter (Signed)
Caller: Lashawnda/Patient; PCP: Plotnikov, Alex; CB#: [phone];  Call regarding Left Ear Discomfort;  Patient states a week ago (12/17/11)  was in shower and got water in her left ear.  "Feels clogged up". Afebrile, No drainage.  She has been putting OTC swimmers ear in her ear.  She felt a "pop" in her ear about an hour ago.  She recent had recent CT scan for sinus issues but she could not take the "large" pill and she could not swallow them and did not take medication.  Emergent s/sx ruled out per Ear Symptoms Protocol with exception to "Pain in ear followed by pop" new or sudden onset change in hearing, ringing in ear, not resolved" . See provider in 24 hours.  Caller understands.  Appt scheduled today 12/24/11 with Dr. Everardo All at 14:00.  Caller expressed understanding with time , location . Advised to call back for questions, concerns or changes.

## 2011-12-25 DIAGNOSIS — J310 Chronic rhinitis: Secondary | ICD-10-CM | POA: Insufficient documentation

## 2012-01-18 ENCOUNTER — Ambulatory Visit: Payer: Medicare Other | Admitting: Internal Medicine

## 2012-02-07 ENCOUNTER — Telehealth: Payer: Self-pay | Admitting: *Deleted

## 2012-02-07 NOTE — Telephone Encounter (Signed)
Rf erq for Lorazepam 1 mg 1/2 po qhs prn. Last filled 12/08/11. Ok to Rf?

## 2012-02-07 NOTE — Telephone Encounter (Signed)
OK to fill this prescription with additional refills x2 Thank you!  

## 2012-02-08 ENCOUNTER — Encounter: Payer: Self-pay | Admitting: Internal Medicine

## 2012-02-08 ENCOUNTER — Ambulatory Visit (INDEPENDENT_AMBULATORY_CARE_PROVIDER_SITE_OTHER): Payer: Medicare Other | Admitting: Internal Medicine

## 2012-02-08 VITALS — BP 154/84 | HR 73 | Ht 65.0 in | Wt 161.0 lb

## 2012-02-08 DIAGNOSIS — I4891 Unspecified atrial fibrillation: Secondary | ICD-10-CM

## 2012-02-08 DIAGNOSIS — I491 Atrial premature depolarization: Secondary | ICD-10-CM

## 2012-02-08 MED ORDER — LORAZEPAM 1 MG PO TABS: 0.5000 mg | ORAL_TABLET | Freq: Every evening | ORAL | Status: DC | PRN

## 2012-02-08 MED ORDER — FLECAINIDE ACETATE 100 MG PO TABS: ORAL_TABLET | ORAL | Status: DC

## 2012-02-08 NOTE — Patient Instructions (Addendum)
Your physician wants you to follow-up in: 12 months with Dr. Taylor. You will receive a reminder letter in the mail two months in advance. If you don't receive a letter, please call our office to schedule the follow-up appointment.    

## 2012-02-08 NOTE — Progress Notes (Signed)
HPI [name] [name] returns today for followup. She is an 78 year old woman with a history of palpitations and documented premature atrial contractions. For over 10 years, her symptoms have been well-controlled with flecainide therapy. In the interim she has done well. She denies chest pain, shortness of breath, syncope, peripheral edema, or symptomatic palpitations. No Known Allergies   Current Outpatient Prescriptions  Medication Sig Dispense Refill  . Cholecalciferol (VITAMIN D3) 1000 UNITS CAPS Take by mouth as directed. 2 po daily       . flecainide (TAMBOCOR) 100 MG tablet 1 TAB EVERY AM 1/2 TAB EVERY PM  135 tablet  3  . LORazepam (ATIVAN) 1 MG tablet Take 0.5 tablets (0.5 mg total) by mouth at bedtime as needed for anxiety.  30 tablet  2  . DISCONTD: flecainide (TAMBOCOR) 100 MG tablet 1 TAB EVERY AM 1/2 TAB EVERY PM  135 tablet  3     Past Medical History  Diagnosis Date  . Anemia     iron def  . Anxiety   . History of colonic polyps     cecal  . Osteoporosis 07/21/2011    t score -2.7  . Hypertension   . Arrhythmia     paroxysmal SVT and symtomatic PAC's  . GERD (gastroesophageal reflux disease)   . Stricture esophagus   . Diverticulosis     colon  . Vitamin d deficiency     2010    ROS:   All systems reviewed and negative except as noted in the HPI.   Past Surgical History  Procedure Date  . A&p, enterocele repair     s/p 2003  . Hemorrhoid surgery   . Breast reduction surgery     s/p 1996  . Abdominal hysterectomy      Family History  Problem Relation Age of Onset  . Lung cancer      2 cousins  . Colon cancer      remote relative  . Kidney disease      brother  . Stroke Father     @ 60  . Hypertension Father   . Hypertension Sister   . Cancer Sister 69    brain  . Stroke Mother     medication induced  . Breast cancer Other 42    breast  . Breast cancer Daughter   . Cancer Daughter 6    breast  . Cancer Other 7    colon/MELANOMA      History   Social History  . Marital Status: Married    Spouse Name: N/A    Number of Children: N/A  . Years of Education: N/A   Occupational History  . Retired    Social History Main Topics  . Smoking status: Former Smoker    Quit date: 05/24/1968  . Smokeless tobacco: Never Used   Comment: only smoked 7 years  . Alcohol Use: No  . Drug Use: No  . Sexually Active: No   Other Topics Concern  . Not on file   Social History Narrative  . No narrative on file     BP 154/84  Pulse 73  Ht 5\' 5"  (1.651 m)  Wt 161 lb (73.029 kg)  BMI 26.79 kg/m2  SpO2 96%  LMP 05/24/1970  Physical Exam:  Well appearing elderly woman, NAD HEENT: Unremarkable Neck:  No JVD, no thyromegally Lungs:  Clear with no wheezes, rales, or rhonchi. HEART:  Regular rate rhythm, no murmurs, no rubs, no clicks Abd:  soft, positive  bowel sounds, no organomegally, no rebound, no guarding Ext:  2 plus pulses, no edema, no cyanosis, no clubbing Skin:  No rashes no nodules Neuro:  CN II through XII intact, motor grossly intact  EKG Normal sinus rhythm   Assess/Plan:

## 2012-02-08 NOTE — Assessment & Plan Note (Signed)
Her symptoms remain well controlled. She will continue her current dose of flecainide.

## 2012-02-08 NOTE — Telephone Encounter (Signed)
Done

## 2012-04-03 DIAGNOSIS — L570 Actinic keratosis: Secondary | ICD-10-CM | POA: Diagnosis not present

## 2012-04-03 DIAGNOSIS — D485 Neoplasm of uncertain behavior of skin: Secondary | ICD-10-CM | POA: Diagnosis not present

## 2012-06-02 DIAGNOSIS — Z23 Encounter for immunization: Secondary | ICD-10-CM | POA: Diagnosis not present

## 2012-08-02 DIAGNOSIS — H905 Unspecified sensorineural hearing loss: Secondary | ICD-10-CM | POA: Diagnosis not present

## 2012-08-02 DIAGNOSIS — H9209 Otalgia, unspecified ear: Secondary | ICD-10-CM | POA: Diagnosis not present

## 2012-08-09 ENCOUNTER — Other Ambulatory Visit: Payer: Self-pay | Admitting: *Deleted

## 2012-08-09 DIAGNOSIS — I4891 Unspecified atrial fibrillation: Secondary | ICD-10-CM

## 2012-08-09 MED ORDER — FLECAINIDE ACETATE 100 MG PO TABS: ORAL_TABLET | ORAL | Status: DC

## 2012-08-11 ENCOUNTER — Other Ambulatory Visit: Payer: Self-pay

## 2012-09-06 ENCOUNTER — Other Ambulatory Visit: Payer: Self-pay | Admitting: Internal Medicine

## 2012-09-07 ENCOUNTER — Other Ambulatory Visit: Payer: Self-pay | Admitting: Internal Medicine

## 2012-09-11 NOTE — Telephone Encounter (Signed)
Rx faxed to pharmacy  

## 2012-09-19 ENCOUNTER — Encounter: Payer: Self-pay | Admitting: Gynecology

## 2012-09-20 ENCOUNTER — Telehealth: Payer: Self-pay | Admitting: *Deleted

## 2012-09-20 MED ORDER — LORAZEPAM 1 MG PO TABS: 0.5000 mg | ORAL_TABLET | Freq: Every evening | ORAL | Status: DC | PRN

## 2012-09-20 NOTE — Telephone Encounter (Signed)
Pearla Dubonnet to call in 1 mg Ativan # 45, no refills, same instructions on previous RX. Please call the local pharmacy and see if they can cancel that prescription so she doesn't go pick it up.

## 2012-09-20 NOTE — Telephone Encounter (Signed)
Rf req for Lorazepam 1 mg to be sent to mail order for 90 day supply. Med was phoned in at local pharmacy on 09/11/12, but patient has not picked it up. Please advise in PCP's absence. Thanks!

## 2012-09-20 NOTE — Telephone Encounter (Signed)
Done

## 2012-09-28 ENCOUNTER — Encounter: Payer: Self-pay | Admitting: Internal Medicine

## 2012-09-28 ENCOUNTER — Ambulatory Visit (INDEPENDENT_AMBULATORY_CARE_PROVIDER_SITE_OTHER): Payer: Medicare Other | Admitting: Internal Medicine

## 2012-09-28 VITALS — BP 144/86 | HR 74 | Ht 65.0 in | Wt 162.4 lb

## 2012-09-28 DIAGNOSIS — I10 Essential (primary) hypertension: Secondary | ICD-10-CM | POA: Diagnosis not present

## 2012-09-28 DIAGNOSIS — R002 Palpitations: Secondary | ICD-10-CM

## 2012-09-28 NOTE — Assessment & Plan Note (Signed)
Her blood pressure is fairly well controlled. She is encouraged to reduce her salt intake and maintain her physical activity as tolerated.

## 2012-09-28 NOTE — Assessment & Plan Note (Signed)
The etiology is unclear. Her symptoms have increased in frequency and severity. Because she is on antiarrhythmic drug therapy, I recommended obtaining a 48 hour Holter monitor to be sure she is not having proarrhythmia or pauses. Her symptoms occur almost every day. No change in medications at this time.

## 2012-09-28 NOTE — Patient Instructions (Signed)
Your physician wants you to follow-up in: 6 months with Dr Taylor You will receive a reminder letter in the mail two months in advance. If you don't receive a letter, please call our office to schedule the follow-up appointment.  Your physician has recommended that you wear a holter monitor. Holter monitors are medical devices that record the heart's electrical activity. Doctors most often use these monitors to diagnose arrhythmias. Arrhythmias are problems with the speed or rhythm of the heartbeat. The monitor is a small, portable device. You can wear one while you do your normal daily activities. This is usually used to diagnose what is causing palpitations/syncope (passing out).    

## 2012-09-28 NOTE — Progress Notes (Signed)
HPI [name] [name] returns today for followup. She is a very pleasant 79 year old woman with a history of palpitations who has been on flecainide therapy for many years. In the interim, she has been stable but notes that over the last several months to have had increased symptoms of dizziness and while she has no more palpitations, she states that it feels like her heart is stopping. She has had no frank syncope. Denies peripheral edema. No cough or hemoptysis. She has had some intermittent chest pressure, not related to exertion.    No Known Allergies   Current Outpatient Prescriptions  Medication Sig Dispense Refill  . Cholecalciferol (VITAMIN D3) 1000 UNITS CAPS Take by mouth as directed. 2 po daily       . flecainide (TAMBOCOR) 100 MG tablet 1 TAB EVERY AM 1/2 TAB EVERY PM  135 tablet  3  . LORazepam (ATIVAN) 1 MG tablet Take 0.5 tablets (0.5 mg total) by mouth at bedtime as needed for anxiety.  45 tablet  0   No current facility-administered medications for this visit.     Past Medical History  Diagnosis Date  . Anemia     iron def  . Anxiety   . History of colonic polyps     cecal  . Osteoporosis 07/21/2011    t score -2.7  . Hypertension   . Arrhythmia     paroxysmal SVT and symtomatic PAC's  . GERD (gastroesophageal reflux disease)   . Stricture esophagus   . Diverticulosis     colon  . Vitamin D deficiency     2010    ROS:   All systems reviewed and negative except as noted in the HPI.   Past Surgical History  Procedure Laterality Date  . A&p, enterocele repair      s/p 2003  . Hemorrhoid surgery    . Breast reduction surgery      s/p 1996  . Abdominal hysterectomy       Family History  Problem Relation Age of Onset  . Lung cancer      2 cousins  . Colon cancer      remote relative  . Kidney disease      brother  . Stroke Father     @ 73  . Hypertension Father   . Hypertension Sister   . Cancer Sister 51    brain  . Stroke Mother     medication  induced  . Breast cancer Other 42    breast  . Breast cancer Daughter   . Cancer Daughter 59    breast  . Cancer Other 13    colon/MELANOMA     History   Social History  . Marital Status: Married    Spouse Name: N/A    Number of Children: N/A  . Years of Education: N/A   Occupational History  . Retired    Social History Main Topics  . Smoking status: Former Smoker    Quit date: 05/24/1968  . Smokeless tobacco: Never Used     Comment: only smoked 7 years  . Alcohol Use: No  . Drug Use: No  . Sexually Active: No   Other Topics Concern  . Not on file   Social History Narrative  . No narrative on file     BP 144/86  Pulse 74  Ht 5\' 5"  (1.651 m)  Wt 162 lb 6.4 oz (73.664 kg)  BMI 27.02 kg/m2  LMP 05/24/1970  Physical Exam:  Well appearing elderly  woman, NAD HEENT: Unremarkable Neck:  No JVD, no thyromegally Back:  No CVA tenderness Lungs:  Clear with no wheezes, rales, or rhonchi. HEART:  Regular rate rhythm, no murmurs, no rubs, no clicks Abd:  soft, positive bowel sounds, no organomegally, no rebound, no guarding Ext:  2 plus pulses, no edema, no cyanosis, no clubbing Skin:  No rashes no nodules Neuro:  CN II through XII intact, motor grossly intact  EKG Normal sinus rhythm, normal axis and intervals.  Assess/Plan:

## 2012-09-29 ENCOUNTER — Ambulatory Visit (INDEPENDENT_AMBULATORY_CARE_PROVIDER_SITE_OTHER): Payer: Medicare Other

## 2012-09-29 DIAGNOSIS — R002 Palpitations: Secondary | ICD-10-CM

## 2012-09-29 DIAGNOSIS — R0989 Other specified symptoms and signs involving the circulatory and respiratory systems: Secondary | ICD-10-CM

## 2012-09-29 NOTE — Progress Notes (Signed)
Placed a 48 hour holter monitor on patient.

## 2012-10-20 ENCOUNTER — Telehealth: Payer: Self-pay | Admitting: *Deleted

## 2012-10-20 NOTE — Telephone Encounter (Signed)
Called patient with holter monitor results from 09/29/2012 and advised that she was in normal sinus rhythm with some PVC's, PAC's, and NSAT. No change in therapy and to stay on Flecainide per Dr.Taylor. Follow up will be in 6 months.

## 2012-11-14 ENCOUNTER — Encounter: Payer: Self-pay | Admitting: Gynecology

## 2012-12-04 ENCOUNTER — Other Ambulatory Visit: Payer: Self-pay | Admitting: *Deleted

## 2012-12-04 DIAGNOSIS — I4891 Unspecified atrial fibrillation: Secondary | ICD-10-CM

## 2012-12-04 MED ORDER — FLECAINIDE ACETATE 100 MG PO TABS: ORAL_TABLET | ORAL | Status: DC

## 2012-12-06 ENCOUNTER — Telehealth: Payer: Self-pay | Admitting: *Deleted

## 2012-12-06 NOTE — Telephone Encounter (Signed)
rf req for lorazepam 1 mg 1/2 po qhs prn. # 90. Ok to Rf? Fax written rx to [phone].

## 2012-12-06 NOTE — Telephone Encounter (Signed)
OK to fill this prescription with additional refills x1 Thank you!  

## 2012-12-07 MED ORDER — LORAZEPAM 1 MG PO TABS: 0.5000 mg | ORAL_TABLET | Freq: Every evening | ORAL | Status: DC | PRN

## 2012-12-07 NOTE — Telephone Encounter (Signed)
Rx faxed/pending MD sig. Will fax once signed.

## 2013-02-19 ENCOUNTER — Other Ambulatory Visit: Payer: Self-pay

## 2013-02-19 DIAGNOSIS — Z1231 Encounter for screening mammogram for malignant neoplasm of breast: Secondary | ICD-10-CM

## 2013-03-15 ENCOUNTER — Ambulatory Visit
Admission: RE | Admit: 2013-03-15 | Discharge: 2013-03-15 | Disposition: A | Discharge status: Home / Self Care | Payer: Medicare Other | Source: Ambulatory Visit

## 2013-03-15 DIAGNOSIS — Z1231 Encounter for screening mammogram for malignant neoplasm of breast: Secondary | ICD-10-CM | POA: Diagnosis not present

## 2013-03-27 ENCOUNTER — Encounter: Payer: Self-pay | Admitting: Internal Medicine

## 2013-03-27 ENCOUNTER — Ambulatory Visit (INDEPENDENT_AMBULATORY_CARE_PROVIDER_SITE_OTHER): Payer: Medicare Other | Admitting: Internal Medicine

## 2013-03-27 VITALS — BP 130/88 | HR 74 | Ht 65.5 in | Wt 160.4 lb

## 2013-03-27 DIAGNOSIS — I4891 Unspecified atrial fibrillation: Secondary | ICD-10-CM | POA: Diagnosis not present

## 2013-03-27 DIAGNOSIS — Z0000 Encounter for general adult medical examination without abnormal findings: Secondary | ICD-10-CM

## 2013-03-27 DIAGNOSIS — I10 Essential (primary) hypertension: Secondary | ICD-10-CM

## 2013-03-27 DIAGNOSIS — Z23 Encounter for immunization: Secondary | ICD-10-CM

## 2013-03-27 DIAGNOSIS — R002 Palpitations: Secondary | ICD-10-CM | POA: Diagnosis not present

## 2013-03-27 MED ORDER — FLECAINIDE ACETATE 100 MG PO TABS: ORAL_TABLET | ORAL | Status: DC

## 2013-03-27 NOTE — Assessment & Plan Note (Signed)
Her palpitations are for the most part controlled. She has occasional episodes and these are quite symptomatic. She'll continue her current regimen of 100 mg daily of flecainide take in the morning, and 50 mg of flecainide taken in the evening.

## 2013-03-27 NOTE — Addendum Note (Signed)
Addended by: Linzie Collin D on: 03/27/2013 12:16 PM   Modules accepted: Orders

## 2013-03-27 NOTE — Assessment & Plan Note (Signed)
Her blood pressure is controlled. She'll continue her current medical therapy and maintain a low-sodium diet.

## 2013-03-27 NOTE — Patient Instructions (Signed)
Your physician wants you to follow-up in: 12 months with Dr. Taylor. You will receive a reminder letter in the mail two months in advance. If you don't receive a letter, please call our office to schedule the follow-up appointment.    

## 2013-03-27 NOTE — Progress Notes (Signed)
HPI [name] [name] returns today for followup. She is a very pleasant 79 year old woman with a history of palpitations who has been on flecainide therapy for many years. In the interim, she has worn a cardiac monitor, which demonstrated rare PACs and PVCs. No syncope. The patient has mild anxiety. She denies chest pain or shortness of breath. She remains a vegetarian.   Current Outpatient Prescriptions  Medication Sig Dispense Refill  . aspirin 81 MG tablet Take 81 mg by mouth daily.      . Cholecalciferol (VITAMIN D3) 1000 UNITS CAPS Take by mouth daily.       . flecainide (TAMBOCOR) 100 MG tablet 1 TAB EVERY AM 1/2 TAB EVERY PM  135 tablet  3  . LORazepam (ATIVAN) 1 MG tablet Take 0.5 tablets (0.5 mg total) by mouth at bedtime as needed for anxiety.  45 tablet  1   No current facility-administered medications for this visit.     Past Medical History  Diagnosis Date  . Anemia     iron def  . Anxiety   . History of colonic polyps     cecal  . Osteoporosis 07/21/2011    t score -2.7  . Hypertension   . Arrhythmia     paroxysmal SVT and symtomatic PAC's  . GERD (gastroesophageal reflux disease)   . Stricture esophagus   . Diverticulosis     colon  . Vitamin D deficiency     2010    ROS:   All systems reviewed and negative except as noted in the HPI.   Past Surgical History  Procedure Laterality Date  . A&p, enterocele repair      s/p 2003  . Hemorrhoid surgery    . Breast reduction surgery      s/p 1996  . Abdominal hysterectomy       Family History  Problem Relation Age of Onset  . Lung cancer      2 cousins  . Colon cancer      remote relative  . Kidney disease      brother  . Stroke Father     @ 44  . Hypertension Father   . Hypertension Sister   . Cancer Sister 45    brain  . Stroke Mother     medication induced  . Breast cancer Other 42    breast  . Breast cancer Daughter   . Cancer Daughter 57    breast  . Cancer Other 17    colon/MELANOMA      History   Social History  . Marital Status: Married    Spouse Name: N/A    Number of Children: N/A  . Years of Education: N/A   Occupational History  . Retired    Social History Main Topics  . Smoking status: Former Smoker    Quit date: 05/24/1968  . Smokeless tobacco: Never Used     Comment: only smoked 7 years  . Alcohol Use: No  . Drug Use: No  . Sexual Activity: No   Other Topics Concern  . Not on file   Social History Narrative  . No narrative on file     BP 130/88  Pulse 74  Ht 5' 5.5" (1.664 m)  Wt 160 lb 6.4 oz (72.757 kg)  BMI 26.28 kg/m2  LMP 05/24/1970  Physical Exam:  Well appearing elderly woman, NAD HEENT: Unremarkable Neck:  No JVD, no thyromegally Back:  No CVA tenderness Lungs:  Clear with no wheezes, rales, or  rhonchi. HEART:  Regular rate rhythm, no murmurs, no rubs, no clicks Abd:  soft, positive bowel sounds, no organomegally, no rebound, no guarding Ext:  2 plus pulses, no edema, no cyanosis, no clubbing Skin:  No rashes no nodules Neuro:  CN II through XII intact, motor grossly intact  EKG Normal sinus rhythm, normal axis and intervals.  Assess/Plan:

## 2013-08-22 DIAGNOSIS — T169XXA Foreign body in ear, unspecified ear, initial encounter: Secondary | ICD-10-CM | POA: Diagnosis not present

## 2013-08-22 DIAGNOSIS — IMO0002 Reserved for concepts with insufficient information to code with codable children: Secondary | ICD-10-CM | POA: Diagnosis not present

## 2013-08-29 ENCOUNTER — Encounter: Payer: Self-pay | Admitting: Internal Medicine

## 2013-08-29 ENCOUNTER — Telehealth: Payer: Self-pay | Admitting: Internal Medicine

## 2013-08-29 ENCOUNTER — Ambulatory Visit (INDEPENDENT_AMBULATORY_CARE_PROVIDER_SITE_OTHER): Payer: Medicare Other | Admitting: Internal Medicine

## 2013-08-29 VITALS — BP 134/92 | HR 73 | Temp 97.9°F | Ht 65.5 in | Wt 163.0 lb

## 2013-08-29 DIAGNOSIS — G459 Transient cerebral ischemic attack, unspecified: Secondary | ICD-10-CM | POA: Diagnosis not present

## 2013-08-29 DIAGNOSIS — L570 Actinic keratosis: Secondary | ICD-10-CM | POA: Insufficient documentation

## 2013-08-29 DIAGNOSIS — I10 Essential (primary) hypertension: Secondary | ICD-10-CM | POA: Diagnosis not present

## 2013-08-29 MED ORDER — LORAZEPAM 1 MG PO TABS: 0.5000 mg | ORAL_TABLET | Freq: Every evening | ORAL | Status: DC | PRN

## 2013-08-29 NOTE — Assessment & Plan Note (Signed)
Continue with current prescription therapy as reflected on the Med list.  

## 2013-08-29 NOTE — Progress Notes (Signed)
Pre visit review using our clinic review tool, if applicable. No additional management support is needed unless otherwise documented below in the visit note. 

## 2013-08-29 NOTE — Assessment & Plan Note (Signed)
Treated - see Procedure 

## 2013-08-29 NOTE — Progress Notes (Signed)
Subjective:    HPI   C/o nasal d/c and allergies C/o sore spot on R thigh  The patient presents for a follow-up of  chronic hypertension, TIA, osteoporosis, anxiety (xanax helped)  She has been grieving her dtr died w/breast cancer     BP Readings from Last 3 Encounters:  08/29/13 134/92  03/27/13 130/88  09/28/12 144/86    Wt Readings from Last 3 Encounters:  08/29/13 163 lb (73.936 kg)  03/27/13 160 lb 6.4 oz (72.757 kg)  09/28/12 162 lb 6.4 oz (73.664 kg)      Review of Systems  Constitutional: Negative for chills, activity change, appetite change, fatigue and unexpected weight change.  HENT: Negative for congestion, mouth sores and sinus pressure.   Eyes: Negative for visual disturbance.  Respiratory: Negative for cough and chest tightness.   Cardiovascular: Negative for leg swelling.  Gastrointestinal: Negative for nausea and abdominal pain.  Genitourinary: Negative for frequency, difficulty urinating and vaginal pain.  Musculoskeletal: Negative for back pain and gait problem.  Skin: Negative for pallor and rash.  Neurological: Negative for dizziness, tremors, weakness, numbness and headaches.  Psychiatric/Behavioral: Negative for suicidal ideas, confusion and sleep disturbance. The patient is nervous/anxious.        Objective:   Physical Exam  Constitutional: She appears well-developed and well-nourished. No distress.  HENT:  Head: Normocephalic.  Right Ear: External ear normal.  Left Ear: External ear normal.  Nose: Nose normal.  Mouth/Throat: Oropharynx is clear and moist.  Eyes: Conjunctivae are normal. Pupils are equal, round, and reactive to light. Right eye exhibits no discharge. Left eye exhibits no discharge.  Neck: Normal range of motion. Neck supple. No JVD present. No tracheal deviation present. No thyromegaly present.  Cardiovascular: Normal rate, regular rhythm and normal heart sounds.   Pulmonary/Chest: No stridor. No respiratory  distress. She has no wheezes.  Abdominal: Soft. Bowel sounds are normal. She exhibits no distension and no mass. There is no tenderness. There is no rebound and no guarding.  Musculoskeletal: She exhibits no edema and no tenderness.  Lymphadenopathy:    She has no cervical adenopathy.  Neurological: She displays normal reflexes. No cranial nerve deficit. She exhibits normal muscle tone. Coordination normal.  Skin: No rash noted. No erythema.  AKs  Psychiatric: She has a normal mood and affect. Her behavior is normal. Judgment and thought content normal.    AKs  Lab Results  Component Value Date   WBC 5.5 07/19/2011   HGB 13.7 07/19/2011   HCT 40.8 07/19/2011   PLT 316.0 07/19/2011   GLUCOSE 93 07/19/2011   CHOL 204* 07/19/2011   TRIG 169.0* 07/19/2011   HDL 59.20 07/19/2011   LDLDIRECT 108.9 07/19/2011   ALT 14 07/19/2011   AST 19 07/19/2011   NA 141 07/19/2011   K 4.1 07/19/2011   CL 104 07/19/2011   CREATININE 0.8 07/19/2011   BUN 15 07/19/2011   CO2 30 07/19/2011   TSH 3.28 07/19/2011    Procedure Note :     Procedure : Cryosurgery   Indication:  Wart(s)  Actinic keratosis(es)   Risks including unsuccessful procedure , bleeding, infection, bruising, scar, a need for a repeat  procedure and others were explained to the patient in detail as well as the benefits. Informed consent was obtained verbally.    6 lesion(s)  on UE, LEs, neck   was/were treated with liquid nitrogen on a Q-tip in a usual fasion . Band-Aid was applied and antibiotic ointment was given for  a later use.   Tolerated well. Complications none.   Postprocedure instructions :     Keep the wounds clean. You can wash them with liquid soap and water. Pat dry with gauze or a Kleenex tissue  Before applying antibiotic ointment and a Band-Aid.   You need to report immediately  if  any signs of infection develop.        Assessment & Plan:

## 2013-08-29 NOTE — Telephone Encounter (Signed)
Relevant patient education mailed to patient.  

## 2013-08-29 NOTE — Patient Instructions (Signed)
   Postprocedure instructions :     Keep the wounds clean. You can wash them with liquid soap and water. Pat dry with gauze or a Kleenex tissue  Before applying antibiotic ointment and a Band-Aid.   You need to report immediately  if  any signs of infection develop.    

## 2013-09-06 ENCOUNTER — Telehealth: Payer: Self-pay | Admitting: Internal Medicine

## 2013-09-06 NOTE — Telephone Encounter (Signed)
Patient is calling to be advised on what she needs to do about her leg. She came in on 4/8 for her non-healing wound and now she states that the area is white on the top and swollen all around. Says that there is a lump under the skin and wants to know what she should do. I advised that she come in for an OV to have it looked at but patient wants to know if Dr. Alain [name] can work her in this week. Please advise.

## 2013-09-09 ENCOUNTER — Encounter: Payer: Self-pay | Admitting: Internal Medicine

## 2013-09-10 NOTE — Telephone Encounter (Signed)
Tomorrow 4:15 Thx

## 2013-09-11 NOTE — Telephone Encounter (Signed)
Pt informed. OV scheduled.

## 2013-09-12 ENCOUNTER — Ambulatory Visit (INDEPENDENT_AMBULATORY_CARE_PROVIDER_SITE_OTHER): Payer: Medicare Other | Admitting: Internal Medicine

## 2013-09-12 ENCOUNTER — Encounter: Payer: Self-pay | Admitting: Internal Medicine

## 2013-09-12 VITALS — BP 146/78 | HR 76 | Temp 97.2°F | Wt 163.5 lb

## 2013-09-12 DIAGNOSIS — I10 Essential (primary) hypertension: Secondary | ICD-10-CM | POA: Diagnosis not present

## 2013-09-12 DIAGNOSIS — L570 Actinic keratosis: Secondary | ICD-10-CM

## 2013-09-12 NOTE — Progress Notes (Signed)
Subjective:    Wound Check     C/o sore spot on R thigh - scabbed then re-occured  The patient presents for a follow-up of  chronic hypertension, TIA, osteoporosis, anxiety (xanax helped)  She has been grieving her dtr died w/breast cancer     BP Readings from Last 3 Encounters:  09/12/13 146/78  08/29/13 134/92  03/27/13 130/88    Wt Readings from Last 3 Encounters:  09/12/13 163 lb 8 oz (74.163 kg)  08/29/13 163 lb (73.936 kg)  03/27/13 160 lb 6.4 oz (72.757 kg)      Review of Systems  Constitutional: Negative for chills, activity change, appetite change, fatigue and unexpected weight change.  HENT: Negative for congestion, mouth sores and sinus pressure.   Eyes: Negative for visual disturbance.  Respiratory: Negative for cough and chest tightness.   Cardiovascular: Negative for leg swelling.  Gastrointestinal: Negative for nausea and abdominal pain.  Genitourinary: Negative for frequency, difficulty urinating and vaginal pain.  Musculoskeletal: Negative for back pain and gait problem.  Skin: Negative for pallor and rash.  Neurological: Negative for dizziness, tremors, weakness, numbness and headaches.  Psychiatric/Behavioral: Negative for suicidal ideas, confusion and sleep disturbance. The patient is nervous/anxious.        Objective:   Physical Exam  Constitutional: She appears well-developed and well-nourished. No distress.  HENT:  Head: Normocephalic.  Right Ear: External ear normal.  Left Ear: External ear normal.  Nose: Nose normal.  Mouth/Throat: Oropharynx is clear and moist.  Eyes: Conjunctivae are normal. Pupils are equal, round, and reactive to light. Right eye exhibits no discharge. Left eye exhibits no discharge.  Neck: Normal range of motion. Neck supple. No JVD present. No tracheal deviation present. No thyromegaly present.  Cardiovascular: Normal rate, regular rhythm and normal heart sounds.   Pulmonary/Chest: No stridor. No respiratory  distress. She has no wheezes.  Abdominal: Soft. Bowel sounds are normal. She exhibits no distension and no mass. There is no tenderness. There is no rebound and no guarding.  Musculoskeletal: She exhibits no edema and no tenderness.  Lymphadenopathy:    She has no cervical adenopathy.  Neurological: She displays normal reflexes. No cranial nerve deficit. She exhibits normal muscle tone. Coordination normal.  Skin: No rash noted. No erythema.  AKs  Psychiatric: She has a normal mood and affect. Her behavior is normal. Judgment and thought content normal.    AKs  Lab Results  Component Value Date   WBC 5.5 07/19/2011   HGB 13.7 07/19/2011   HCT 40.8 07/19/2011   PLT 316.0 07/19/2011   GLUCOSE 93 07/19/2011   CHOL 204* 07/19/2011   TRIG 169.0* 07/19/2011   HDL 59.20 07/19/2011   LDLDIRECT 108.9 07/19/2011   ALT 14 07/19/2011   AST 19 07/19/2011   NA 141 07/19/2011   K 4.1 07/19/2011   CL 104 07/19/2011   CREATININE 0.8 07/19/2011   BUN 15 07/19/2011   CO2 30 07/19/2011   TSH 3.28 07/19/2011    Procedure Note :     Procedure : Cryosurgery   Indication:  Actinic keratosis(es) x1   Risks including unsuccessful procedure , bleeding, infection, bruising, scar, a need for a repeat  procedure and others were explained to the patient in detail as well as the benefits. Informed consent was obtained verbally.    1lesion(s)  on R LE  was/were treated with liquid nitrogen on a Q-tip in a usual fasion . Band-Aid was applied and antibiotic ointment was given for a  later use.   Tolerated well. Complications none.   Postprocedure instructions :     Keep the wounds clean. You can wash them with liquid soap and water. Pat dry with gauze or a Kleenex tissue  Before applying antibiotic ointment and a Band-Aid.   You need to report immediately  if  any signs of infection develop.        Assessment & Plan:

## 2013-09-12 NOTE — Assessment & Plan Note (Signed)
Continue with current prescription therapy as reflected on the Med list.  

## 2013-09-12 NOTE — Progress Notes (Signed)
Pre visit review using our clinic review tool, if applicable. No additional management support is needed unless otherwise documented below in the visit note. 

## 2013-09-12 NOTE — Assessment & Plan Note (Signed)
Treated another lesion today

## 2013-10-31 ENCOUNTER — Encounter: Payer: Self-pay | Admitting: Internal Medicine

## 2013-10-31 ENCOUNTER — Other Ambulatory Visit (INDEPENDENT_AMBULATORY_CARE_PROVIDER_SITE_OTHER): Payer: Medicare Other

## 2013-10-31 ENCOUNTER — Ambulatory Visit (INDEPENDENT_AMBULATORY_CARE_PROVIDER_SITE_OTHER): Payer: Medicare Other | Admitting: Internal Medicine

## 2013-10-31 VITALS — BP 142/88 | HR 76 | Temp 98.3°F | Resp 16 | Wt 158.0 lb

## 2013-10-31 DIAGNOSIS — F411 Generalized anxiety disorder: Secondary | ICD-10-CM

## 2013-10-31 DIAGNOSIS — D509 Iron deficiency anemia, unspecified: Secondary | ICD-10-CM

## 2013-10-31 DIAGNOSIS — R209 Unspecified disturbances of skin sensation: Secondary | ICD-10-CM

## 2013-10-31 DIAGNOSIS — I491 Atrial premature depolarization: Secondary | ICD-10-CM

## 2013-10-31 DIAGNOSIS — I10 Essential (primary) hypertension: Secondary | ICD-10-CM

## 2013-10-31 DIAGNOSIS — G459 Transient cerebral ischemic attack, unspecified: Secondary | ICD-10-CM

## 2013-10-31 DIAGNOSIS — R202 Paresthesia of skin: Secondary | ICD-10-CM

## 2013-10-31 LAB — URINALYSIS
Bilirubin Urine: NEGATIVE
Ketones, ur: NEGATIVE
Leukocytes, UA: NEGATIVE
Nitrite: NEGATIVE
Specific Gravity, Urine: <=1.005 — AB (ref 1.000–1.030)
Total Protein, Urine: NEGATIVE
Urine Glucose: NEGATIVE
Urobilinogen, UA: 0.2 (ref 0.0–1.0)
pH: 6 (ref 5.0–8.0)

## 2013-10-31 LAB — CBC WITH DIFFERENTIAL/PLATELET
Basophils Absolute: 0 10*3/uL (ref 0.0–0.1)
Basophils Relative: 0.8 % (ref 0.0–3.0)
Eosinophils Absolute: 0 10*3/uL (ref 0.0–0.7)
Eosinophils Relative: 1.1 % (ref 0.0–5.0)
HCT: 38.8 % (ref 36.0–46.0)
Hemoglobin: 12.8 g/dL (ref 12.0–15.0)
Lymphocytes Relative: 33.4 % (ref 12.0–46.0)
Lymphs Abs: 1.6 10*3/uL (ref 0.7–4.0)
MCHC: 33.1 g/dL (ref 30.0–36.0)
MCV: 93.8 fl (ref 78.0–100.0)
Monocytes Absolute: 0.4 10*3/uL (ref 0.1–1.0)
Monocytes Relative: 8.1 % (ref 3.0–12.0)
Neutro Abs: 2.6 10*3/uL (ref 1.4–7.7)
Neutrophils Relative %: 56.6 % (ref 43.0–77.0)
Platelets: 291 10*3/uL (ref 150.0–400.0)
RBC: 4.14 Mil/uL (ref 3.87–5.11)
RDW: 13.6 % (ref 11.5–15.5)
WBC: 4.7 10*3/uL (ref 4.0–10.5)

## 2013-10-31 LAB — TSH: TSH: 2.79 u[IU]/mL (ref 0.35–4.50)

## 2013-10-31 LAB — LIPID PANEL
Cholesterol: 170 mg/dL (ref 0–200)
HDL: 50.3 mg/dL (ref >39.00)
LDL Cholesterol: 93 mg/dL (ref 0–99)
NonHDL: 119.7
Total CHOL/HDL Ratio: 3
Triglycerides: 132 mg/dL (ref 0.0–149.0)
VLDL: 26.4 mg/dL (ref 0.0–40.0)

## 2013-10-31 LAB — HEPATIC FUNCTION PANEL
ALT: 11 U/L (ref 0–35)
AST: 22 U/L (ref 0–37)
Albumin: 4.3 g/dL (ref 3.5–5.2)
Alkaline Phosphatase: 65 U/L (ref 39–117)
Bilirubin, Direct: 0.1 mg/dL (ref 0.0–0.3)
Total Bilirubin: 0.5 mg/dL (ref 0.2–1.2)
Total Protein: 6.8 g/dL (ref 6.0–8.3)

## 2013-10-31 LAB — BASIC METABOLIC PANEL
BUN: 14 mg/dL (ref 6–23)
CO2: 27 mEq/L (ref 19–32)
Calcium: 9.8 mg/dL (ref 8.4–10.5)
Chloride: 104 mEq/L (ref 96–112)
Creatinine, Ser: 0.8 mg/dL (ref 0.4–1.2)
GFR: 70.98 mL/min (ref >60.00)
Glucose, Bld: 90 mg/dL (ref 70–99)
Potassium: 4.5 mEq/L (ref 3.5–5.1)
Sodium: 140 mEq/L (ref 135–145)

## 2013-10-31 LAB — VITAMIN B12: Vitamin B-12: 441 pg/mL (ref 211–911)

## 2013-10-31 MED ORDER — LORAZEPAM 1 MG PO TABS: 0.5000 mg | ORAL_TABLET | Freq: Every evening | ORAL | Status: DC | PRN

## 2013-10-31 NOTE — Progress Notes (Signed)
Pre visit review using our clinic review tool, if applicable. No additional management support is needed unless otherwise documented below in the visit note. 

## 2013-10-31 NOTE — Progress Notes (Signed)
   Subjective:    HPI   F/u sore spot on R thigh - resolved  The patient presents for a follow-up of  chronic hypertension, TIA, osteoporosis, anxiety (xanax helped)  She has been grieving her dtr died w/breast cancer     BP Readings from Last 3 Encounters:  10/31/13 142/88  09/12/13 146/78  08/29/13 134/92    Wt Readings from Last 3 Encounters:  10/31/13 158 lb (71.668 kg)  09/12/13 163 lb 8 oz (74.163 kg)  08/29/13 163 lb (73.936 kg)      Review of Systems  Constitutional: Negative for chills, activity change, appetite change, fatigue and unexpected weight change.  HENT: Negative for congestion, mouth sores and sinus pressure.   Eyes: Negative for visual disturbance.  Respiratory: Negative for cough and chest tightness.   Cardiovascular: Negative for leg swelling.  Gastrointestinal: Negative for nausea and abdominal pain.  Genitourinary: Negative for frequency, difficulty urinating and vaginal pain.  Musculoskeletal: Negative for back pain and gait problem.  Skin: Negative for pallor and rash.  Neurological: Negative for dizziness, tremors, weakness, numbness and headaches.  Psychiatric/Behavioral: Negative for suicidal ideas, confusion and sleep disturbance. The patient is nervous/anxious.        Objective:   Physical Exam  Constitutional: She appears well-developed and well-nourished. No distress.  HENT:  Head: Normocephalic.  Right Ear: External ear normal.  Left Ear: External ear normal.  Nose: Nose normal.  Mouth/Throat: Oropharynx is clear and moist.  Eyes: Conjunctivae are normal. Pupils are equal, round, and reactive to light. Right eye exhibits no discharge. Left eye exhibits no discharge.  Neck: Normal range of motion. Neck supple. No JVD present. No tracheal deviation present. No thyromegaly present.  Cardiovascular: Normal rate, regular rhythm and normal heart sounds.   Pulmonary/Chest: No stridor. No respiratory distress. She has no wheezes.   Abdominal: Soft. Bowel sounds are normal. She exhibits no distension and no mass. There is no tenderness. There is no rebound and no guarding.  Musculoskeletal: She exhibits no edema and no tenderness.  Lymphadenopathy:    She has no cervical adenopathy.  Neurological: She displays normal reflexes. No cranial nerve deficit. She exhibits normal muscle tone. Coordination normal.  Skin: No rash noted. No erythema.  AKs  Psychiatric: She has a normal mood and affect. Her behavior is normal. Judgment and thought content normal.     Lab Results  Component Value Date   WBC 5.5 07/19/2011   HGB 13.7 07/19/2011   HCT 40.8 07/19/2011   PLT 316.0 07/19/2011   GLUCOSE 93 07/19/2011   CHOL 204* 07/19/2011   TRIG 169.0* 07/19/2011   HDL 59.20 07/19/2011   LDLDIRECT 108.9 07/19/2011   ALT 14 07/19/2011   AST 19 07/19/2011   NA 141 07/19/2011   K 4.1 07/19/2011   CL 104 07/19/2011   CREATININE 0.8 07/19/2011   BUN 15 07/19/2011   CO2 30 07/19/2011   TSH 3.28 07/19/2011       Assessment & Plan:

## 2013-10-31 NOTE — Assessment & Plan Note (Signed)
Continue with current prescription therapy as reflected on the Med list.  

## 2013-10-31 NOTE — Assessment & Plan Note (Signed)
On Tambacor

## 2013-10-31 NOTE — Assessment & Plan Note (Signed)
Off BP meds - BP nl at home NAS diet

## 2014-01-17 ENCOUNTER — Telehealth: Payer: Self-pay | Admitting: Internal Medicine

## 2014-01-17 NOTE — Telephone Encounter (Addendum)
Spoke with patients has had problems with irregular heart beats since Mon and wants to be seen.  I have asked her to increase her Flecainde to 100mg  twice daily and follow up on Wed at 2:45

## 2014-01-17 NOTE — Telephone Encounter (Signed)
New Message  Pt called, reports she is on Flecainide and that she feels her Arrythmia is bad and it has worsened as of Monday 01/14/2014.Marland Kitchen Pt says she really needs to come in and was advised per Dr. Lovena Le that he would work her in. Please call to assist.------   (Offered Pt the 01/22/2014 @ 2 pm Appt ) She requests a sooner appt//sr--Please assist

## 2014-01-17 NOTE — Telephone Encounter (Signed)
I spoke with patient's husband.  She was at the store and he will have her return my call

## 2014-01-18 ENCOUNTER — Encounter (HOSPITAL_COMMUNITY): Payer: Self-pay | Admitting: Emergency Medicine

## 2014-01-18 ENCOUNTER — Emergency Department (HOSPITAL_COMMUNITY)
Admission: EM | Admit: 2014-01-18 | Discharge: 2014-01-18 | Disposition: A | Discharge status: Home / Self Care | Payer: Medicare Other | Attending: Emergency Medicine | Admitting: Emergency Medicine

## 2014-01-18 ENCOUNTER — Emergency Department (HOSPITAL_COMMUNITY): Payer: Medicare Other

## 2014-01-18 DIAGNOSIS — Z860100 Personal history of colon polyps, unspecified: Secondary | ICD-10-CM | POA: Insufficient documentation

## 2014-01-18 DIAGNOSIS — Z79899 Other long term (current) drug therapy: Secondary | ICD-10-CM | POA: Insufficient documentation

## 2014-01-18 DIAGNOSIS — I10 Essential (primary) hypertension: Secondary | ICD-10-CM | POA: Insufficient documentation

## 2014-01-18 DIAGNOSIS — Z7982 Long term (current) use of aspirin: Secondary | ICD-10-CM | POA: Insufficient documentation

## 2014-01-18 DIAGNOSIS — R002 Palpitations: Secondary | ICD-10-CM

## 2014-01-18 DIAGNOSIS — Z8739 Personal history of other diseases of the musculoskeletal system and connective tissue: Secondary | ICD-10-CM | POA: Diagnosis not present

## 2014-01-18 DIAGNOSIS — Z862 Personal history of diseases of the blood and blood-forming organs and certain disorders involving the immune mechanism: Secondary | ICD-10-CM | POA: Diagnosis not present

## 2014-01-18 DIAGNOSIS — Z87891 Personal history of nicotine dependence: Secondary | ICD-10-CM | POA: Diagnosis not present

## 2014-01-18 DIAGNOSIS — Z8719 Personal history of other diseases of the digestive system: Secondary | ICD-10-CM | POA: Diagnosis not present

## 2014-01-18 DIAGNOSIS — R079 Chest pain, unspecified: Secondary | ICD-10-CM | POA: Insufficient documentation

## 2014-01-18 DIAGNOSIS — F411 Generalized anxiety disorder: Secondary | ICD-10-CM | POA: Diagnosis not present

## 2014-01-18 DIAGNOSIS — Z8659 Personal history of other mental and behavioral disorders: Secondary | ICD-10-CM | POA: Insufficient documentation

## 2014-01-18 LAB — CBC
HCT: 40.1 % (ref 36.0–46.0)
Hemoglobin: 13.1 g/dL (ref 12.0–15.0)
MCH: 31 pg (ref 26.0–34.0)
MCHC: 32.7 g/dL (ref 30.0–36.0)
MCV: 94.8 fL (ref 78.0–100.0)
Platelets: 274 10*3/uL (ref 150–400)
RBC: 4.23 MIL/uL (ref 3.87–5.11)
RDW: 13.9 % (ref 11.5–15.5)
WBC: 4.1 10*3/uL (ref 4.0–10.5)

## 2014-01-18 LAB — I-STAT TROPONIN, ED: Troponin i, poc: 0 ng/mL (ref 0.00–0.08)

## 2014-01-18 LAB — BASIC METABOLIC PANEL
Anion gap: 14 (ref 5–15)
BUN: 13 mg/dL (ref 6–23)
CO2: 24 mEq/L (ref 19–32)
Calcium: 9.4 mg/dL (ref 8.4–10.5)
Chloride: 103 mEq/L (ref 96–112)
Creatinine, Ser: 0.65 mg/dL (ref 0.50–1.10)
GFR calc Af Amer: >90 mL/min (ref >90)
GFR calc non Af Amer: 81 mL/min — ABNORMAL LOW (ref >90)
Glucose, Bld: 93 mg/dL (ref 70–99)
Potassium: 4 mEq/L (ref 3.7–5.3)
Sodium: 141 mEq/L (ref 137–147)

## 2014-01-18 LAB — MAGNESIUM: Magnesium: 2.2 mg/dL (ref 1.5–2.5)

## 2014-01-18 NOTE — Discharge Instructions (Signed)
As discussed, your evaluation today has been largely reassuring.  But, it is important that you monitor your condition carefully, and do not hesitate to return to the ED if you develop new, or concerning changes in your condition. ? ?Otherwise, please follow-up with your physician for appropriate ongoing care. ? ?

## 2014-01-18 NOTE — ED Provider Notes (Signed)
CSN: [id number]     Arrival date & time 01/18/14  0913 History   First MD Initiated Contact with Patient 01/18/14 [phone]     Chief Complaint  Patient presents with  . Chest Pain     (Consider location/radiation/quality/duration/timing/severity/associated sxs/prior Treatment) HPI Patient presents with palpitations.  Patient has a history of PAC, takes flecainide. She notes over the past 4 days she's had increasingly frequent, increasingly uncomfortable palpitations.  There has been no ongoing pain, dyspnea, lightheadedness, syncope, or other focal changes from baseline. Patient tried to see cardiology yesterday, was sent to the emergency department.  Past Medical History  Diagnosis Date  . Anemia     iron def  . Anxiety   . History of colonic polyps     cecal  . Osteoporosis 07/21/2011    t score -2.7  . Hypertension   . Arrhythmia     paroxysmal SVT and symtomatic PAC's  . GERD (gastroesophageal reflux disease)   . Stricture esophagus   . Diverticulosis     colon  . Vitamin D deficiency     2010   Past Surgical History  Procedure Laterality Date  . A&p, enterocele repair      s/p 2003  . Hemorrhoid surgery    . Breast reduction surgery      s/p 1996  . Abdominal hysterectomy     Family History  Problem Relation Age of Onset  . Lung cancer      2 cousins  . Colon cancer      remote relative  . Kidney disease      brother  . Stroke Father     @ 17  . Hypertension Father   . Hypertension Sister   . Cancer Sister 18    brain  . Stroke Mother     medication induced  . Breast cancer Other 63    breast  . Breast cancer Daughter   . Cancer Daughter 18    breast  . Cancer Other 39    colon/MELANOMA   History  Substance Use Topics  . Smoking status: Former Smoker    Quit date: 05/24/1968  . Smokeless tobacco: Never Used     Comment: only smoked 7 years  . Alcohol Use: No   OB History   Grav Para Term Preterm Abortions TAB SAB Ect Mult Living   2 2         2      Review of Systems  Constitutional:       Per HPI, otherwise negative  HENT:       Per HPI, otherwise negative  Respiratory:       Per HPI, otherwise negative  Cardiovascular:       Per HPI, otherwise negative  Gastrointestinal: Negative for vomiting.  Endocrine:       Negative aside from HPI  Genitourinary:       Neg aside from HPI   Musculoskeletal:       Per HPI, otherwise negative  Skin: Negative.   Neurological: Negative for syncope.      Allergies  Review of patient's allergies indicates no known allergies.  Home Medications   Prior to Admission medications   Medication Sig Start Date End Date Taking? Authorizing Provider  aspirin 81 MG tablet Take 81 mg by mouth daily.   Yes Historical Provider, MD  Cholecalciferol (VITAMIN D3) 1000 UNITS CAPS Take 1,000 Units by mouth daily.    Yes Historical Provider, MD  flecainide (TAMBOCOR) 100 MG  tablet Take 50-100 mg by mouth 2 (two) times daily. Take 100 mg by mouth in the morning and take 50 mg by mouth in the evening.   Yes Historical Provider, MD  LORazepam (ATIVAN) 1 MG tablet Take 0.25 mg by mouth daily as needed for anxiety.   Yes Historical Provider, MD   BP 138/70  Pulse 74  Temp(Src) 97.9 F (36.6 C) (Oral)  Resp 16  Ht 5' 5.5" (1.664 m)  Wt 154 lb (69.854 kg)  BMI 25.23 kg/m2  SpO2 97%  LMP 05/24/1970 Physical Exam  Nursing note and vitals reviewed. Constitutional: She is oriented to person, place, and time. She appears well-developed and well-nourished. No distress.  HENT:  Head: Normocephalic and atraumatic.  Eyes: Conjunctivae and EOM are normal.  Cardiovascular: Normal rate and regular rhythm.   Pulmonary/Chest: Effort normal and breath sounds normal. No stridor. No respiratory distress.  Abdominal: She exhibits no distension.  Musculoskeletal: She exhibits no edema.  Neurological: She is alert and oriented to person, place, and time. No cranial nerve deficit.  Skin: Skin is warm and dry.   Psychiatric: She has a normal mood and affect.    ED Course  Procedures (including critical care time) Labs Review Labs Reviewed  CBC  BASIC METABOLIC PANEL  MAGNESIUM  TROPONIN I  Randolm Idol, ED    Imaging Review Dg Chest 2 View  01/18/2014   CLINICAL DATA:  Chest pain.  Palpitations.  EXAM: CHEST  2 VIEW  COMPARISON:  None.  FINDINGS: The heart size and mediastinal contours are within normal limits. Both lungs are clear. Pulmonary hyperinflation is suspicious for COPD. No evidence of pleural effusion. No definite hilar or mediastinal masses identified. The visualized skeletal structures are unremarkable.  IMPRESSION: Probable COPD.  No active disease.   Electronically Signed   By: Earle Gell M.D.   On: 01/18/2014 09:56     EKG Interpretation   Date/Time:  Friday January 18 2014 09:18:13 EDT Ventricular Rate:  73 PR Interval:  214 QRS Duration: 106 QT Interval:  415 QTC Calculation: 457 R Axis:   64 Text Interpretation:  Sinus rhythm Borderline prolonged PR interval Low  voltage, precordial leads Sinus rhythm Artifact Low voltage QRS No  significant change since last tracing Borderline ECG Confirmed by  Carmin Muskrat  MD [phone]) on 01/18/2014 10:05:25 AM     12:36 PM Patient states that she is ready to go home. She has no symptoms. I reviewed all findings, return precautions, follow up instructions, patient states that she has a followup appointment in 3 days.  MDM   Patient history PAC, presents with ongoing palpitations, no pain.  On exam she is awake, alert, in no distress.  She is hemodynamically stable.  Labs are unremarkable.  Patient remains a symptomatically, with no complaints, and after several hours of monitoring, with no decompensation, patient was discharged to follow up with her physicians as scheduled in 3 days    Carmin Muskrat, MD 01/18/14 1237

## 2014-01-18 NOTE — ED Notes (Signed)
EKG Given to Dr. Vanita Panda. Copy Placed in Chart.

## 2014-01-18 NOTE — ED Notes (Signed)
Per EMS- Pt comes from home this morning at 0400, woke up with "funny feeling" in her chest denies it is pain or is discomfort. EMS reports when she feels funny feeling, noted PAC on monitor. Negative orthostatics. 162/70 BP, SR 74, 98% RA, 16 RR. 20 to right hand. Pt is a x 4. Reports she has a headache.

## 2014-01-18 NOTE — ED Notes (Signed)
Patient transported to X-ray 

## 2014-01-19 ENCOUNTER — Encounter (HOSPITAL_COMMUNITY): Payer: Self-pay | Admitting: Emergency Medicine

## 2014-01-19 ENCOUNTER — Observation Stay (HOSPITAL_COMMUNITY)
Admission: EM | Admit: 2014-01-19 | Discharge: 2014-01-20 | Disposition: A | Discharge status: Home / Self Care | Payer: Medicare Other | Attending: Internal Medicine | Admitting: Internal Medicine

## 2014-01-19 DIAGNOSIS — Z860100 Personal history of colon polyps, unspecified: Secondary | ICD-10-CM | POA: Insufficient documentation

## 2014-01-19 DIAGNOSIS — K5730 Diverticulosis of large intestine without perforation or abscess without bleeding: Secondary | ICD-10-CM | POA: Insufficient documentation

## 2014-01-19 DIAGNOSIS — I10 Essential (primary) hypertension: Secondary | ICD-10-CM | POA: Diagnosis not present

## 2014-01-19 DIAGNOSIS — I499 Cardiac arrhythmia, unspecified: Secondary | ICD-10-CM | POA: Diagnosis not present

## 2014-01-19 DIAGNOSIS — M810 Age-related osteoporosis without current pathological fracture: Secondary | ICD-10-CM | POA: Insufficient documentation

## 2014-01-19 DIAGNOSIS — Z87891 Personal history of nicotine dependence: Secondary | ICD-10-CM | POA: Insufficient documentation

## 2014-01-19 DIAGNOSIS — Z79899 Other long term (current) drug therapy: Secondary | ICD-10-CM | POA: Diagnosis not present

## 2014-01-19 DIAGNOSIS — E559 Vitamin D deficiency, unspecified: Secondary | ICD-10-CM | POA: Diagnosis not present

## 2014-01-19 DIAGNOSIS — R079 Chest pain, unspecified: Secondary | ICD-10-CM | POA: Diagnosis not present

## 2014-01-19 DIAGNOSIS — R002 Palpitations: Principal | ICD-10-CM | POA: Insufficient documentation

## 2014-01-19 DIAGNOSIS — K222 Esophageal obstruction: Secondary | ICD-10-CM | POA: Diagnosis not present

## 2014-01-19 DIAGNOSIS — R0789 Other chest pain: Secondary | ICD-10-CM

## 2014-01-19 DIAGNOSIS — Z7982 Long term (current) use of aspirin: Secondary | ICD-10-CM | POA: Diagnosis not present

## 2014-01-19 DIAGNOSIS — D649 Anemia, unspecified: Secondary | ICD-10-CM | POA: Diagnosis not present

## 2014-01-19 DIAGNOSIS — F411 Generalized anxiety disorder: Secondary | ICD-10-CM | POA: Diagnosis not present

## 2014-01-19 DIAGNOSIS — K219 Gastro-esophageal reflux disease without esophagitis: Secondary | ICD-10-CM | POA: Diagnosis not present

## 2014-01-19 DIAGNOSIS — I491 Atrial premature depolarization: Secondary | ICD-10-CM | POA: Diagnosis present

## 2014-01-19 NOTE — ED Notes (Signed)
Per EMS, Pt was evaluated here last night for same complaint. Pt states she had chest pain with a 'fluttering" feeling as well. EMS reports pt can feel palpitations.

## 2014-01-20 ENCOUNTER — Emergency Department (HOSPITAL_COMMUNITY): Payer: Medicare Other

## 2014-01-20 DIAGNOSIS — F411 Generalized anxiety disorder: Secondary | ICD-10-CM

## 2014-01-20 DIAGNOSIS — R002 Palpitations: Secondary | ICD-10-CM

## 2014-01-20 DIAGNOSIS — R0789 Other chest pain: Secondary | ICD-10-CM | POA: Diagnosis not present

## 2014-01-20 DIAGNOSIS — K219 Gastro-esophageal reflux disease without esophagitis: Secondary | ICD-10-CM

## 2014-01-20 DIAGNOSIS — I10 Essential (primary) hypertension: Secondary | ICD-10-CM

## 2014-01-20 DIAGNOSIS — R079 Chest pain, unspecified: Secondary | ICD-10-CM | POA: Diagnosis not present

## 2014-01-20 LAB — CBC WITH DIFFERENTIAL/PLATELET
Basophils Absolute: 0 10*3/uL (ref 0.0–0.1)
Basophils Relative: 1 % (ref 0–1)
Eosinophils Absolute: 0 10*3/uL (ref 0.0–0.7)
Eosinophils Relative: 1 % (ref 0–5)
HCT: 37.9 % (ref 36.0–46.0)
Hemoglobin: 12.3 g/dL (ref 12.0–15.0)
Lymphocytes Relative: 28 % (ref 12–46)
Lymphs Abs: 1.3 10*3/uL (ref 0.7–4.0)
MCH: 30.9 pg (ref 26.0–34.0)
MCHC: 32.5 g/dL (ref 30.0–36.0)
MCV: 95.2 fL (ref 78.0–100.0)
Monocytes Absolute: 0.5 10*3/uL (ref 0.1–1.0)
Monocytes Relative: 10 % (ref 3–12)
Neutro Abs: 3 10*3/uL (ref 1.7–7.7)
Neutrophils Relative %: 60 % (ref 43–77)
Platelets: 257 10*3/uL (ref 150–400)
RBC: 3.98 MIL/uL (ref 3.87–5.11)
RDW: 13.8 % (ref 11.5–15.5)
WBC: 4.8 10*3/uL (ref 4.0–10.5)

## 2014-01-20 LAB — PRO B NATRIURETIC PEPTIDE: Pro B Natriuretic peptide (BNP): 108.4 pg/mL (ref 0–450)

## 2014-01-20 LAB — COMPREHENSIVE METABOLIC PANEL
ALT: 15 U/L (ref 0–35)
AST: 19 U/L (ref 0–37)
Albumin: 3.8 g/dL (ref 3.5–5.2)
Alkaline Phosphatase: 82 U/L (ref 39–117)
Anion gap: 14 (ref 5–15)
BUN: 17 mg/dL (ref 6–23)
CO2: 23 mEq/L (ref 19–32)
Calcium: 9.6 mg/dL (ref 8.4–10.5)
Chloride: 103 mEq/L (ref 96–112)
Creatinine, Ser: 0.74 mg/dL (ref 0.50–1.10)
GFR calc Af Amer: >90 mL/min (ref >90)
GFR calc non Af Amer: 78 mL/min — ABNORMAL LOW (ref >90)
Glucose, Bld: 112 mg/dL — ABNORMAL HIGH (ref 70–99)
Potassium: 3.8 mEq/L (ref 3.7–5.3)
Sodium: 140 mEq/L (ref 137–147)
Total Bilirubin: <0.2 mg/dL — ABNORMAL LOW (ref 0.3–1.2)
Total Protein: 7 g/dL (ref 6.0–8.3)

## 2014-01-20 LAB — LIPASE, BLOOD: Lipase: 25 U/L (ref 11–59)

## 2014-01-20 LAB — TROPONIN I
Troponin I: <0.3 ng/mL (ref <0.30)
Troponin I: <0.3 ng/mL (ref <0.30)
Troponin I: <0.3 ng/mL (ref <0.30)
Troponin I: <0.3 ng/mL (ref <0.30)

## 2014-01-20 MED ORDER — ACETAMINOPHEN 325 MG PO TABS
650.0000 mg | ORAL_TABLET | ORAL | Status: DC | PRN
Start: 2014-01-20 — End: 2014-01-20

## 2014-01-20 MED ORDER — ENOXAPARIN SODIUM 40 MG/0.4ML ~~LOC~~ SOLN
40.0000 mg | SUBCUTANEOUS | Status: DC
  Administered 2014-01-20: 40 mg via SUBCUTANEOUS
  Filled 2014-01-20: qty 0.4

## 2014-01-20 MED ORDER — GUAIFENESIN ER 600 MG PO TB12: 600.0000 mg | ORAL_TABLET | Freq: Two times a day (BID) | ORAL | Status: DC

## 2014-01-20 MED ORDER — ONDANSETRON HCL 4 MG/2ML IJ SOLN: 4.0000 mg | Freq: Four times a day (QID) | INTRAMUSCULAR | Status: DC | PRN

## 2014-01-20 MED ORDER — ASPIRIN 81 MG PO CHEW
81.0000 mg | CHEWABLE_TABLET | Freq: Every day | ORAL | Status: DC
  Administered 2014-01-20: 81 mg via ORAL
  Filled 2014-01-20: qty 1

## 2014-01-20 MED ORDER — NITROGLYCERIN 0.4 MG SL SUBL
0.4000 mg | SUBLINGUAL_TABLET | SUBLINGUAL | Status: DC | PRN
Start: 2014-01-20 — End: 2014-01-20

## 2014-01-20 MED ORDER — LORAZEPAM 0.5 MG PO TABS
0.2500 mg | ORAL_TABLET | Freq: Every day | ORAL | Status: DC | PRN
  Administered 2014-01-20: 0.25 mg via ORAL
  Filled 2014-01-20: qty 1

## 2014-01-20 MED ORDER — FLECAINIDE ACETATE 50 MG PO TABS
100.0000 mg | ORAL_TABLET | Freq: Every day | ORAL | Status: DC
  Administered 2014-01-20: 100 mg via ORAL
  Filled 2014-01-20: qty 2

## 2014-01-20 MED ORDER — ALPRAZOLAM 0.25 MG PO TABS: 0.2500 mg | ORAL_TABLET | Freq: Two times a day (BID) | ORAL | Status: DC | PRN

## 2014-01-20 MED ORDER — DM-GUAIFENESIN ER 30-600 MG PO TB12
1.0000 | ORAL_TABLET | Freq: Once | ORAL | Status: AC
  Administered 2014-01-20: 1 via ORAL
  Filled 2014-01-20: qty 1

## 2014-01-20 MED ORDER — ASPIRIN 325 MG PO TABS: 325.0000 mg | ORAL_TABLET | Freq: Once | ORAL | Status: DC

## 2014-01-20 MED ORDER — FLECAINIDE ACETATE 50 MG PO TABS
50.0000 mg | ORAL_TABLET | Freq: Every day | ORAL | Status: DC
  Filled 2014-01-20: qty 1

## 2014-01-20 NOTE — Progress Notes (Signed)
Utilization Review Completed.   Amadi Yoshino, RN, BSN Nurse Case Manager  

## 2014-01-20 NOTE — Progress Notes (Signed)
  Echocardiogram 2D Echocardiogram has been performed.  Mauricio Po 01/20/2014, 10:36 AM

## 2014-01-20 NOTE — ED Notes (Signed)
Pt stated that she recently urinated and is unable to urinate at this time.

## 2014-01-20 NOTE — ED Provider Notes (Signed)
CSN: [id number]     Arrival date & time 01/19/14  2318 History   First MD Initiated Contact with Patient 01/19/14 2319     Chief Complaint  Patient presents with  . Chest Pain     (Consider location/radiation/quality/duration/timing/severity/associated sxs/prior Treatment) HPI [name] [name] is a 80 y.o. female with a past medical history of PAC and PVC coming in with palpitations and chest pressure. Patient was seen yesterday department with similar symptoms and discharged home. She is advised to increase her flecainide, but she refused to do so at home until she can see her cardiologist in clinic. Patient has an appointment with him in the next 3 days. Patient describes chest pressure occurring for 20 minutes today. She states she can feel when she has a PAC or PVC. She's been on flecainide for the past 16 years, but just began having problems in the past week.  She is associated shortness of breath and diaphoresis. She denies emesis. Nothing makes the symptoms better or worse. 10 Systems reviewed and are negative for acute change except as noted in the HPI.     Past Medical History  Diagnosis Date  . Anemia     iron def  . Anxiety   . History of colonic polyps     cecal  . Osteoporosis 07/21/2011    t score -2.7  . Hypertension   . Arrhythmia     paroxysmal SVT and symtomatic PAC's  . GERD (gastroesophageal reflux disease)   . Stricture esophagus   . Diverticulosis     colon  . Vitamin D deficiency     2010   Past Surgical History  Procedure Laterality Date  . A&p, enterocele repair      s/p 2003  . Hemorrhoid surgery    . Breast reduction surgery      s/p 1996  . Abdominal hysterectomy     Family History  Problem Relation Age of Onset  . Lung cancer      2 cousins  . Colon cancer      remote relative  . Kidney disease      brother  . Stroke Father     @ 80  . Hypertension Father   . Hypertension Sister   . Cancer Sister 49    brain  . Stroke Mother    medication induced  . Breast cancer Other 22    breast  . Breast cancer Daughter   . Cancer Daughter 68    breast  . Cancer Other 81    colon/MELANOMA   History  Substance Use Topics  . Smoking status: Former Smoker    Quit date: 05/24/1968  . Smokeless tobacco: Never Used     Comment: only smoked 7 years  . Alcohol Use: No   OB History   Grav Para Term Preterm Abortions TAB SAB Ect Mult Living   2 2        2      Review of Systems    Allergies  Review of patient's allergies indicates no known allergies.  Home Medications   Prior to Admission medications   Medication Sig Start Date End Date Taking? Authorizing Provider  aspirin 81 MG chewable tablet Chew 324 mg by mouth once.   Yes Historical Provider, MD  aspirin 81 MG tablet Take 81 mg by mouth daily.   Yes Historical Provider, MD  Cholecalciferol (VITAMIN D3) 1000 UNITS CAPS Take 1,000 Units by mouth daily.    Yes Historical Provider, MD  flecainide (TAMBOCOR) 50 MG tablet Take 50-100 mg by mouth See admin instructions. Take 2 tablets in the morning and 1 tablet in the evening   Yes Historical Provider, MD  LORazepam (ATIVAN) 1 MG tablet Take 0.25 mg by mouth daily as needed for anxiety.   Yes Historical Provider, MD   LMP 05/24/1970 Physical Exam  Nursing note and vitals reviewed. Constitutional: She is oriented to person, place, and time. She appears well-developed and well-nourished. No distress.  HENT:  Head: Normocephalic and atraumatic.  Nose: Nose normal.  Mouth/Throat: Oropharynx is clear and moist. No oropharyngeal exudate.  Eyes: Conjunctivae and EOM are normal. Pupils are equal, round, and reactive to light. No scleral icterus.  Neck: Normal range of motion. Neck supple. No JVD present. No tracheal deviation present. No thyromegaly present.  Cardiovascular: Normal rate, regular rhythm and normal heart sounds.  Exam reveals no gallop and no friction rub.   No murmur heard. Pulmonary/Chest: Effort normal  and breath sounds normal. No respiratory distress. She has no wheezes. She exhibits no tenderness.  Abdominal: Soft. Bowel sounds are normal. She exhibits no distension and no mass. There is no tenderness. There is no rebound and no guarding.  Musculoskeletal: Normal range of motion. She exhibits no edema and no tenderness.  Lymphadenopathy:    She has no cervical adenopathy.  Neurological: She is alert and oriented to person, place, and time. No cranial nerve deficit.  Skin: Skin is warm and dry. No rash noted. No erythema. No pallor.    ED Course  Procedures (including critical care time) Labs Review Labs Reviewed  CBC WITH DIFFERENTIAL  COMPREHENSIVE METABOLIC PANEL  LIPASE, BLOOD  TROPONIN I  URINALYSIS, ROUTINE W REFLEX MICROSCOPIC    Imaging Review Dg Chest 2 View  01/18/2014   CLINICAL DATA:  Chest pain.  Palpitations.  EXAM: CHEST  2 VIEW  COMPARISON:  None.  FINDINGS: The heart size and mediastinal contours are within normal limits. Both lungs are clear. Pulmonary hyperinflation is suspicious for COPD. No evidence of pleural effusion. No definite hilar or mediastinal masses identified. The visualized skeletal structures are unremarkable.  IMPRESSION: Probable COPD.  No active disease.   Electronically Signed   By: Earle Gell M.D.   On: 01/18/2014 09:56     EKG Interpretation   Date/Time:  Sunday January 20 2014 00:36:15 EDT Ventricular Rate:  79 PR Interval:  218 QRS Duration: 103 QT Interval:  390 QTC Calculation: 447 R Axis:   59 Text Interpretation:  Sinus rhythm with 1st degree A-V block No  significant change since last tracing Confirmed by Glynn Octave  [id number]) on 01/20/2014 1:27:23 AM      MDM   Final diagnoses:  None    Patient presents today with symptoms of chest pressure and palpitations. She is a bounce back from yesterday morning where she presented with the same thing. I will Begin a cardiac workup in the emergency department.  Upon repeat  examination the patient continues to be symptomatic with each PAC or PVC that was thrown. This is a bounce back patient high-risk to be retained in the hospital for further workup and evaluation by cardiology.    Everlene Balls, MD 01/20/14 [phone]

## 2014-01-20 NOTE — Discharge Summary (Signed)
Physician Discharge Summary  [name] [name] LZJ:[id number] DOB: 04-02-1930 DOA: 01/19/2014  PCP: Walker Kehr, MD  Admit date: 01/19/2014 Discharge date: 01/20/2014  Time spent: >45 minutes  Discharge Condition: stable  Diet recommendation: heart healthy   Discharge Diagnoses:  Principal Problem:   Chest discomfort Active Problems:   HYPERTENSION   ATRIAL PREMATURE BEATS   GERD   Palpitations   History of present illness:  [name] [name] is a 80 y.o. female with Past medical history of palpitation secondary to PACs, Hypertension,PSVT.  the patient presented with complaints of chest discomfort. She mentions she has been having on and off chest discomfort that lasts anywhere from 15 minutes to 4 hours for the past week.  She describes this discomfort as similar to her palpitation that she had in the past. But since last night she has been waking up in the middle of the night with this kind of discomfort lasting longer than her usual and not resolving.She presented in the ER on 8/29 with similar symptoms and was recommended to followup with her cardiologist. But she had recurrence of her symptoms again today with generalized tiredness and fatigue throughout the day.  Hospital Course: Earlier today the patient complained of palpitations and appeared quite anxious. Her heart rhythym on the telemetry monitor was sinus. Rare PACs were seen. ECHO report was unrevealing for the cause of her symptoms. I did ask the nurse to give her a dose of Ativan (she takes at home) which help alleviate her symptoms. I have re-assured her that she is not having arrythmia's when she is feeling the palpitations and should try to avoid becoming anxious. She is advised to take Ativan 0.25 mg if she has un resolving episodes at night.   Procedures: ECHO 01/20/14  - Left ventricle: The cavity size was normal. Systolic function was normal. The estimated ejection fraction was in the range of 55% to 60%. Wall motion was  normal; there were no regional wall motion abnormalities. - Aortic valve: Trileaflet; normal thickness, mildly calcified leaflets. - Mitral valve: There was trivial regurgitation. - Atrial septum: There was increased thickness of the septum, consistent with lipomatous hypertrophy. - Tricuspid valve: There was trivial regurgitation.   Discharge Exam: Filed Weights   01/20/14 0327  Weight: 71.033 kg (156 lb 9.6 oz)   Filed Vitals:   01/20/14 0327  BP: 163/78  Pulse: 78  Temp: 98 F (36.7 C)  Resp: 18    General: AAO x 3, no distress Cardiovascular: RRR, no murmurs  Respiratory: clear to auscultation bilaterally GI: soft, non-tender, non-distended, bowel sound positive  Discharge Instructions You were cared for by a hospitalist during your hospital stay. If you have any questions about your discharge medications or the care you received while you were in the hospital after you are discharged, you can call the unit and asked to speak with the hospitalist on call if the hospitalist that took care of you is not available. Once you are discharged, your primary care physician will handle any further medical issues. Please note that NO REFILLS for any discharge medications will be authorized once you are discharged, as it is imperative that you return to your primary care physician (or establish a relationship with a primary care physician if you do not have one) for your aftercare needs so that they can reassess your need for medications and monitor your lab values.  Discharge Instructions   Diet - low sodium heart healthy    Complete by:  As  directed      Increase activity slowly    Complete by:  As directed             Medication List         aspirin 81 MG tablet  Take 81 mg by mouth daily.     flecainide 50 MG tablet  Commonly known as:  TAMBOCOR  Take 50-100 mg by mouth See admin instructions. Take 2 tablets in the morning and 1 tablet in the evening     guaiFENesin 600  MG 12 hr tablet  Commonly known as:  MUCINEX  Take 1 tablet (600 mg total) by mouth 2 (two) times daily.     LORazepam 1 MG tablet  Commonly known as:  ATIVAN  Take 0.25 mg by mouth daily as needed for anxiety.     Vitamin D3 1000 UNITS Caps  Take 1,000 Units by mouth daily.       No Known Allergies    The results of significant diagnostics from this hospitalization (including imaging, microbiology, ancillary and laboratory) are listed below for reference.    Significant Diagnostic Studies: Dg Chest 2 View  01/20/2014   CLINICAL DATA:  Chest pressure.  Pain.  EXAM: CHEST  2 VIEW  COMPARISON:  Two-view chest 01/18/2014  FINDINGS: The heart size is normal. Atherosclerotic calcifications are noted at the aortic arch. Mild interstitial coarsening is stable. No focal airspace disease is present. There is no edema or effusion. The visualized soft tissues and bony thorax are unremarkable.  IMPRESSION: 1. No acute cardiopulmonary disease or significant interval change. 2. Stable chronic interstitial coarsening. 3. Atherosclerosis.   Electronically Signed   By: Lawrence Santiago M.D.   On: 01/20/2014 00:37   Dg Chest 2 View  01/18/2014   CLINICAL DATA:  Chest pain.  Palpitations.  EXAM: CHEST  2 VIEW  COMPARISON:  None.  FINDINGS: The heart size and mediastinal contours are within normal limits. Both lungs are clear. Pulmonary hyperinflation is suspicious for COPD. No evidence of pleural effusion. No definite hilar or mediastinal masses identified. The visualized skeletal structures are unremarkable.  IMPRESSION: Probable COPD.  No active disease.   Electronically Signed   By: Earle Gell M.D.   On: 01/18/2014 09:56    Microbiology: No results found for this or any previous visit (from the past 240 hour(s)).   Labs: Basic Metabolic Panel:  Recent Labs Lab 01/18/14 1000 01/19/14 0053  NA 141 140  K 4.0 3.8  CL 103 103  CO2 24 23  GLUCOSE 93 112*  BUN 13 17  CREATININE 0.65 0.74   CALCIUM 9.4 9.6  MG 2.2  --    Liver Function Tests:  Recent Labs Lab 01/19/14 0053  AST 19  ALT 15  ALKPHOS 82  BILITOT <0.2*  PROT 7.0  ALBUMIN 3.8    Recent Labs Lab 01/19/14 0053  LIPASE 25   No results found for this basename: AMMONIA,  in the last 168 hours CBC:  Recent Labs Lab 01/18/14 1000 01/19/14 0053  WBC 4.1 4.8  NEUTROABS  --  3.0  HGB 13.1 12.3  HCT 40.1 37.9  MCV 94.8 95.2  PLT 274 257   Cardiac Enzymes:  Recent Labs Lab 01/19/14 0053 01/20/14 0350 01/20/14 0650 01/20/14 0943  TROPONINI <0.30 <0.30 <0.30 <0.30   BNP: BNP (last 3 results)  Recent Labs  01/20/14 0350  PROBNP 108.4   CBG: No results found for this basename: GLUCAP,  in the last 168  hours     Signed:  Debbe Odea, MD [facility] 01/20/2014, 3:07 PM

## 2014-01-20 NOTE — H&P (Signed)
[facility] History and Physical  Patient: [name] [name]  YSA:[id number]  DOB: 1930-01-28  DOS: the patient was seen and examined on 01/20/2014 PCP: Walker Kehr, MD  Chief Complaint: Chest pain  HPI: [name] [name] is a 80 y.o. female with Past medical history of palpitation secondary to PACs,  Hypertension,PSVT.  the patient presented with complaints of chest discomfort. She mentions since last one week she has been having on and off chest discomfort that lasts anywhere from 15 minutes to 4 hours. She describes this discomfort as similar to her palpitation that she had in the past. But since last night she has been waking up in the middle of the night with this kind of discomfort lasting longer than her usual and not resolving. She initially called her urologist office and was recommended to increase her back at night 100 mg twice a day, later on she presented in the ER one day ago with similar symptoms and was recommended to followup with her cardiologist in 3 days as her symptoms improved. But she had recurrence of her symptoms again today with generalized tiredness and fatigue throughout the day. With this she denies any complaint of fever, chills, shortness of breath, nausea, vomiting, heartburn, abdominal pain, diarrhea, burning urination, leg swelling.  she denies any recent change in her medication.  The patient is coming from home. And at her baseline independent for most of her ADL.  Review of Systems: as mentioned in the history of present illness.  A Comprehensive review of the other systems is negative.  Past Medical History  Diagnosis Date  . Anemia     iron def  . Anxiety   . History of colonic polyps     cecal  . Osteoporosis 07/21/2011    t score -2.7  . Hypertension   . Arrhythmia     paroxysmal SVT and symtomatic PAC's  . GERD (gastroesophageal reflux disease)   . Stricture esophagus   . Diverticulosis     colon  . Vitamin D deficiency     2010   Past  Surgical History  Procedure Laterality Date  . A&p, enterocele repair      s/p 2003  . Hemorrhoid surgery    . Breast reduction surgery      s/p 1996  . Abdominal hysterectomy     Social History:  reports that she quit smoking about 45 years ago. She has never used smokeless tobacco. She reports that she does not drink alcohol or use illicit drugs.  No Known Allergies  Family History  Problem Relation Age of Onset  . Lung cancer      2 cousins  . Colon cancer      remote relative  . Kidney disease      brother  . Stroke Father     @ 33  . Hypertension Father   . Hypertension Sister   . Cancer Sister 4    brain  . Stroke Mother     medication induced  . Breast cancer Other 91    breast  . Breast cancer Daughter   . Cancer Daughter 79    breast  . Cancer Other 65    colon/MELANOMA    Prior to Admission medications   Medication Sig Start Date End Date Taking? Authorizing Provider  aspirin 81 MG chewable tablet Chew 324 mg by mouth once.   Yes Historical Provider, MD  aspirin 81 MG tablet Take 81 mg by mouth daily.   Yes Historical  Provider, MD  Cholecalciferol (VITAMIN D3) 1000 UNITS CAPS Take 1,000 Units by mouth daily.    Yes Historical Provider, MD  flecainide (TAMBOCOR) 50 MG tablet Take 50-100 mg by mouth See admin instructions. Take 2 tablets in the morning and 1 tablet in the evening   Yes Historical Provider, MD  LORazepam (ATIVAN) 1 MG tablet Take 0.25 mg by mouth daily as needed for anxiety.   Yes Historical Provider, MD    Physical Exam: Filed Vitals:   01/20/14 0130 01/20/14 0200 01/20/14 0300 01/20/14 0327  BP: 124/66 115/71 126/55 163/78  Pulse: 79 74 73 78  Temp:    98 F (36.7 C)  Resp: 17 17 17 18   Height:    5\' 5"  (1.651 m)  Weight:    71.033 kg (156 lb 9.6 oz)  SpO2: 94% 94% 95% 94%    General: Alert, Awake and Oriented to Time, Place and Person. Appear in mild distress Eyes: PERRL ENT: Oral Mucosa clear moist. Neck: no  JVD Cardiovascular: S1 and S2 Present,  aortic systolic Murmur, Peripheral Pulses Present Respiratory: Bilateral Air entry equal and Decreased, Clear to Auscultation, noCrackles, no wheezes Abdomen: Bowel Sound Present, Soft and Non tender Skin: no Rash Extremities: no Pedal edema, no calf tenderness Neurologic: Grossly no focal neuro deficit.  Labs on Admission:  CBC:  Recent Labs Lab 01/18/14 1000 01/19/14 0053  WBC 4.1 4.8  NEUTROABS  --  3.0  HGB 13.1 12.3  HCT 40.1 37.9  MCV 94.8 95.2  PLT 274 257    CMP     Component Value Date/Time   NA 140 01/19/2014 0053   K 3.8 01/19/2014 0053   CL 103 01/19/2014 0053   CO2 23 01/19/2014 0053   GLUCOSE 112* 01/19/2014 0053   BUN 17 01/19/2014 0053   CREATININE 0.74 01/19/2014 0053   CALCIUM 9.6 01/19/2014 0053   PROT 7.0 01/19/2014 0053   ALBUMIN 3.8 01/19/2014 0053   AST 19 01/19/2014 0053   ALT 15 01/19/2014 0053   ALKPHOS 82 01/19/2014 0053   BILITOT <0.2* 01/19/2014 0053   GFRNONAA 78* 01/19/2014 0053   GFRAA >90 01/19/2014 0053     Recent Labs Lab 01/19/14 0053  LIPASE 25   No results found for this basename: AMMONIA,  in the last 168 hours   Recent Labs Lab 01/19/14 0053 01/20/14 0350  TROPONINI <0.30 <0.30   BNP (last 3 results)  Recent Labs  01/20/14 0350  PROBNP 108.4    Radiological Exams on Admission: Dg Chest 2 View  01/20/2014   CLINICAL DATA:  Chest pressure.  Pain.  EXAM: CHEST  2 VIEW  COMPARISON:  Two-view chest 01/18/2014  FINDINGS: The heart size is normal. Atherosclerotic calcifications are noted at the aortic arch. Mild interstitial coarsening is stable. No focal airspace disease is present. There is no edema or effusion. The visualized soft tissues and bony thorax are unremarkable.  IMPRESSION: 1. No acute cardiopulmonary disease or significant interval change. 2. Stable chronic interstitial coarsening. 3. Atherosclerosis.   Electronically Signed   By: Lawrence Santiago M.D.   On: 01/20/2014 00:37    Dg Chest 2 View  01/18/2014   CLINICAL DATA:  Chest pain.  Palpitations.  EXAM: CHEST  2 VIEW  COMPARISON:  None.  FINDINGS: The heart size and mediastinal contours are within normal limits. Both lungs are clear. Pulmonary hyperinflation is suspicious for COPD. No evidence of pleural effusion. No definite hilar or mediastinal masses identified. The visualized skeletal structures  are unremarkable.  IMPRESSION: Probable COPD.  No active disease.   Electronically Signed   By: Earle Gell M.D.   On: 01/18/2014 09:56    EKG: Independently reviewed. normal sinus rhythm, nonspecific ST and T waves changes. Assessment/Plan Principal Problem:   Chest discomfort Active Problems:   HYPERTENSION   ATRIAL PREMATURE BEATS   GERD   Palpitations   1. Chest discomfort  the patient is presenting with complaints of chest discomfort that has been ongoing since last night. Along with that she also has a chronic sensation of skipped beats and palpitations worsening over last one week. With this the patient will be admitted in the hospital to rule out an echocardiogram, we will obtain serial troponin. Monitor her on telemetry. She'll be kept n.p.o. for possible further workup. I will continue her flecainide at home dose present.  2. anxiety Continue lorazepam  DVT Prophylaxis: subcutaneous Heparin Nutrition:  N.p.o.  Code Status:  Full  Disposition: Admitted to observation telemetry unit.  Author: Berle Mull, MD [facility] Pager: [phone] 01/20/2014, 5:54 AM    If 7PM-7AM, please contact night-coverage [email or web address] Password [email or web address]  **Disclaimer: This note may have been dictated with voice recognition software. Similar sounding words can inadvertently be transcribed and this note may contain transcription errors which may not have been corrected upon publication of note.**

## 2014-01-20 NOTE — ED Notes (Signed)
Attempted report x1. 

## 2014-01-20 NOTE — Progress Notes (Signed)
[facility] Progress Note  80 y/o female admitted for palpitations and chest discomfort. Continues to feel palpitations and appears quite anxious. Having no chest pain or dyspnea but clearly very anxious.  Telemetry reveals normal sinus rhythm with occasional PACs. Patient reassured. ECHO pending. No other changed in medications needed at this time.    Debbe Odea, MD

## 2014-01-22 ENCOUNTER — Ambulatory Visit: Payer: Medicare Other | Admitting: Internal Medicine

## 2014-01-23 ENCOUNTER — Ambulatory Visit: Payer: Medicare Other | Admitting: Internal Medicine

## 2014-02-07 ENCOUNTER — Ambulatory Visit (INDEPENDENT_AMBULATORY_CARE_PROVIDER_SITE_OTHER): Payer: Medicare Other | Admitting: Nurse Practitioner

## 2014-02-07 ENCOUNTER — Encounter: Payer: Self-pay | Admitting: Nurse Practitioner

## 2014-02-07 VITALS — BP 132/72 | HR 84 | Temp 98.1°F | Wt 160.2 lb

## 2014-02-07 DIAGNOSIS — T148XXA Other injury of unspecified body region, initial encounter: Secondary | ICD-10-CM

## 2014-02-07 DIAGNOSIS — J3089 Other allergic rhinitis: Secondary | ICD-10-CM

## 2014-02-07 DIAGNOSIS — T1490XA Injury, unspecified, initial encounter: Secondary | ICD-10-CM

## 2014-02-07 DIAGNOSIS — J302 Other seasonal allergic rhinitis: Secondary | ICD-10-CM

## 2014-02-07 MED ORDER — LORATADINE 10 MG PO TABS: 10.0000 mg | ORAL_TABLET | Freq: Every day | ORAL | Status: DC

## 2014-02-07 MED ORDER — FLUTICASONE PROPIONATE 50 MCG/ACT NA SUSP: 1.0000 | Freq: Every day | NASAL | Status: DC

## 2014-02-07 MED ORDER — BACITRACIN-NEOMYCIN-POLYMYXIN 400-5-5000 EX OINT: 1.0000 "application " | TOPICAL_OINTMENT | Freq: Two times a day (BID) | CUTANEOUS | Status: DC

## 2014-02-07 NOTE — Progress Notes (Signed)
Pre visit review using our clinic review tool, if applicable. No additional management support is needed unless otherwise documented below in the visit note. 

## 2014-02-07 NOTE — Progress Notes (Signed)
Subjective:    Patient ID: [name] [name], female    DOB: [birth year], 80 y.o.   MRN: [id number]  HPI  Patient is seen for complaints of bite type of wound on her left upper thigh while she was gardening approximately 2 weeks ago.  Patient states area hasn't healed and itches.  She also complains of post nasal drip.  Which patient took Zyrtec 10 mg 1 po for approximately 7-10 days but did not notice any improvement.   Denies any palpitations chest pain pressure, no shortness of breath.     Review of Systems  Constitutional: Negative.   HENT: Positive for congestion and postnasal drip. Negative for rhinorrhea and sinus pressure.   Respiratory: Negative.   Cardiovascular: Negative.   Skin: Negative for rash.       Bite wound as described in HPI   Past Medical History  Diagnosis Date  . Anemia     iron def  . Anxiety   . History of colonic polyps     cecal  . Osteoporosis 07/21/2011    t score -2.7  . Hypertension   . Arrhythmia     paroxysmal SVT and symtomatic PAC's  . GERD (gastroesophageal reflux disease)   . Stricture esophagus   . Diverticulosis     colon  . Vitamin D deficiency     2010    History   Social History  . Marital Status: Married    Spouse Name: N/A    Number of Children: N/A  . Years of Education: N/A   Occupational History  . Retired    Social History Main Topics  . Smoking status: Former Smoker    Quit date: 05/24/1968  . Smokeless tobacco: Never Used     Comment: only smoked 7 years  . Alcohol Use: No  . Drug Use: No  . Sexual Activity: No   Other Topics Concern  . Not on file   Social History Narrative  . No narrative on file    Past Surgical History  Procedure Laterality Date  . A&p, enterocele repair      s/p 2003  . Hemorrhoid surgery    . Breast reduction surgery      s/p 1996  . Abdominal hysterectomy      Family History  Problem Relation Age of Onset  . Lung cancer      2 cousins  . Colon cancer      remote relative    . Kidney disease      brother  . Stroke Father     @ 24  . Hypertension Father   . Hypertension Sister   . Cancer Sister 53    brain  . Stroke Mother     medication induced  . Breast cancer Other 18    breast  . Breast cancer Daughter   . Cancer Daughter 43    breast  . Cancer Other 47    colon/MELANOMA    No Known Allergies  Current Outpatient Prescriptions on File Prior to Visit  Medication Sig Dispense Refill  . aspirin 81 MG tablet Take 81 mg by mouth daily.      . Cholecalciferol (VITAMIN D3) 1000 UNITS CAPS Take 1,000 Units by mouth daily.       . flecainide (TAMBOCOR) 50 MG tablet Take 50-100 mg by mouth See admin instructions. Take 2 tablets in the morning and 1 tablet in the evening      . LORazepam (ATIVAN) 1 MG tablet  Take 0.25 mg by mouth daily as needed for anxiety.      Marland Kitchen guaiFENesin (MUCINEX) 600 MG 12 hr tablet Take 1 tablet (600 mg total) by mouth 2 (two) times daily.  30 tablet  0   No current facility-administered medications on file prior to visit.    BP 132/72  Pulse 84  Temp(Src) 98.1 F (36.7 C) (Oral)  Wt 160 lb 4 oz (72.689 kg)  SpO2 94%  LMP 05/24/1970        Objective:   Physical Exam  Constitutional: She is oriented to person, place, and time. She appears well-developed and well-nourished. No distress.  HENT:  Head: Normocephalic.  Pulmonary/Chest: Effort normal.  Neurological: She is alert and oriented to person, place, and time.  Skin: Skin is warm and dry.  Left upper thigh with well circumscribed area approximately 3cm  Erytehmaotus, not indurated no drainage noted.  Non painful on palpation. Superficial abrasion  Psychiatric: She has a normal mood and affect.          Assessment & Plan:  1. Bite - Neosporin apply topically to affected area for 5 days.  Cover with bandaid for 3 days then leave open to air.   2. Other seasonal allergic rhinitis  - fluticasone (FLONASE ALLERGY RELIEF) 50 MCG/ACT nasal spray; Place 1  spray into both nostrils daily.  Dispense: 9.9 g; Refill: 0  Stop zyrtec, no claritin as patient recently hospitalized for palpitations.   Follow up with Primary care physician in 2-3 months Call clinic with questions or concerns

## 2014-02-07 NOTE — Patient Instructions (Addendum)
Apply neosporin daily to affected area for 5 days.  Start Claritin 1/2 - 1 tablet daily for allergies.  Follow up appointment in 2- 3 months with Dr. Doug Sou  Allergies Allergies may happen from anything your body is sensitive to. This may be food, medicines, pollens, chemicals, and nearly anything around you in everyday life that produces allergens. An allergen is anything that causes an allergy producing substance. Heredity is often a factor in causing these problems. This means you may have some of the same allergies as your parents. Food allergies happen in all age groups. Food allergies are some of the most severe and life threatening. Some common food allergies are cow's milk, seafood, eggs, nuts, wheat, and soybeans. SYMPTOMS   Swelling around the mouth.  An itchy red rash or hives.  Vomiting or diarrhea.  Difficulty breathing. SEVERE ALLERGIC REACTIONS ARE LIFE-THREATENING. This reaction is called anaphylaxis. It can cause the mouth and throat to swell and cause difficulty with breathing and swallowing. In severe reactions only a trace amount of food (for example, peanut oil in a salad) may cause death within seconds. Seasonal allergies occur in all age groups. These are seasonal because they usually occur during the same season every year. They may be a reaction to molds, grass pollens, or tree pollens. Other causes of problems are house dust mite allergens, pet dander, and mold spores. The symptoms often consist of nasal congestion, a runny itchy nose associated with sneezing, and tearing itchy eyes. There is often an associated itching of the mouth and ears. The problems happen when you come in contact with pollens and other allergens. Allergens are the particles in the air that the body reacts to with an allergic reaction. This causes you to release allergic antibodies. Through a chain of events, these eventually cause you to release histamine into the blood stream. Although it is meant  to be protective to the body, it is this release that causes your discomfort. This is why you were given anti-histamines to feel better. If you are unable to pinpoint the offending allergen, it may be determined by skin or blood testing. Allergies cannot be cured but can be controlled with medicine. Hay fever is a collection of all or some of the seasonal allergy problems. It may often be treated with simple over-the-counter medicine such as diphenhydramine. Take medicine as directed. Do not drink alcohol or drive while taking this medicine. Check with your caregiver or package insert for child dosages. If these medicines are not effective, there are many new medicines your caregiver can prescribe. Stronger medicine such as nasal spray, eye drops, and corticosteroids may be used if the first things you try do not work well. Other treatments such as immunotherapy or desensitizing injections can be used if all else fails. Follow up with your caregiver if problems continue. These seasonal allergies are usually not life threatening. They are generally more of a nuisance that can often be handled using medicine. HOME CARE INSTRUCTIONS   If unsure what causes a reaction, keep a diary of foods eaten and symptoms that follow. Avoid foods that cause reactions.  If hives or rash are present:  Take medicine as directed.  You may use an over-the-counter antihistamine (diphenhydramine) for hives and itching as needed.  Apply cold compresses (cloths) to the skin or take baths in cool water. Avoid hot baths or showers. Heat will make a rash and itching worse.  If you are severely allergic:  Following a treatment for a severe  reaction, hospitalization is often required for closer follow-up.  Wear a medic-alert bracelet or necklace stating the allergy.  You and your family must learn how to give adrenaline or use an anaphylaxis kit.  If you have had a severe reaction, always carry your anaphylaxis kit or  EpiPen with you. Use this medicine as directed by your caregiver if a severe reaction is occurring. Failure to do so could have a fatal outcome. SEEK MEDICAL CARE IF:  You suspect a food allergy. Symptoms generally happen within 30 minutes of eating a food.  Your symptoms have not gone away within 2 days or are getting worse.  You develop new symptoms.  You want to retest yourself or your child with a food or drink you think causes an allergic reaction. Never do this if an anaphylactic reaction to that food or drink has happened before. Only do this under the care of a caregiver. SEEK IMMEDIATE MEDICAL CARE IF:   You have difficulty breathing, are wheezing, or have a tight feeling in your chest or throat.  You have a swollen mouth, or you have hives, swelling, or itching all over your body.  You have had a severe reaction that has responded to your anaphylaxis kit or an EpiPen. These reactions may return when the medicine has worn off. These reactions should be considered life threatening. MAKE SURE YOU:   Understand these instructions.  Will watch your condition.  Will get help right away if you are not doing well or get worse. Document Released: 08/03/2002 Document Revised: 09/04/2012 Document Reviewed: 01/08/2008 [facility] Patient Information 2015 [place], [place]. This information is not intended to replace advice given to you by your health care provider. Make sure you discuss any questions you have with your health care provider.   Bee, Wasp, or Hornet Sting Your caregiver has diagnosed you as having an insect sting. An insect sting appears as a red lump in the skin that sometimes has a tiny hole in the center, or it may have a stinger in the center of the wound. The most common stings are from wasps, hornets and bees. Individuals have different reactions to insect stings.  A normal reaction may cause pain, swelling, and redness around the sting site.  A localized allergic  reaction may cause swelling and redness that extends beyond the sting site.  A large local reaction may continue to develop over the next 12 to 36 hours.  On occasion, the reactions can be severe (anaphylactic reaction). An anaphylactic reaction may cause wheezing; difficulty breathing; chest pain; fainting; raised, itchy, red patches on the skin; a sick feeling to your stomach (nausea); vomiting; cramping; or diarrhea. If you have had an anaphylactic reaction to an insect sting in the past, you are more likely to have one again. HOME CARE INSTRUCTIONS   With bee stings, a small sac of poison is left in the wound. Brushing across this with something such as a credit card, or anything similar, will help remove this and decrease the amount of the reaction. This same procedure will not help a wasp sting as they do not leave behind a stinger and poison sac.  Apply a cold compress for 10 to 20 minutes every hour for 1 to 2 days, depending on severity, to reduce swelling and itching.  To lessen pain, a paste made of water and baking soda may be rubbed on the bite or sting and left on for 5 minutes.  To relieve itching and swelling, you may use  take medication or apply medicated creams or lotions as directed.  Only take over-the-counter or prescription medicines for pain, discomfort, or fever as directed by your caregiver.  Wash the sting site daily with soap and water. Apply antibiotic ointment on the sting site as directed.  If you suffered a severe reaction:  If you did not require hospitalization, an adult will need to stay with you for 24 hours in case the symptoms return.  You may need to wear a medical bracelet or necklace stating the allergy.  You and your family need to learn when and how to use an anaphylaxis kit or epinephrine injection.  If you have had a severe reaction before, always carry your anaphylaxis kit with you. SEEK MEDICAL CARE IF:   None of the above helps within 2 to  3 days.  The area becomes red, warm, tender, and swollen beyond the area of the bite or sting.  You have an oral temperature above 102 F (38.9 C). SEEK IMMEDIATE MEDICAL CARE IF:  You have symptoms of an allergic reaction which are:  Wheezing.  Difficulty breathing.  Chest pain.  Lightheadedness or fainting.  Itchy, raised, red patches on the skin.  Nausea, vomiting, cramping or diarrhea. ANY OF THESE SYMPTOMS MAY REPRESENT A SERIOUS PROBLEM THAT IS AN EMERGENCY. Do not wait to see if the symptoms will go away. Get medical help right away. Call your local emergency services (911 in [place] DO NOT drive yourself to the hospital. MAKE SURE YOU:   Understand these instructions.  Will watch your condition.  Will get help right away if you are not doing well or get worse. Document Released: 05/10/2005 Document Revised: 08/02/2011 Document Reviewed: 10/25/2009 [facility] Patient Information 2015 [place], [place]. This information is not intended to replace advice given to you by your health care provider. Make sure you discuss any questions you have with your health care provider.

## 2014-02-12 ENCOUNTER — Ambulatory Visit: Payer: Medicare Other | Admitting: Nurse Practitioner

## 2014-03-25 ENCOUNTER — Encounter: Payer: Self-pay | Admitting: Nurse Practitioner

## 2014-03-26 ENCOUNTER — Other Ambulatory Visit: Payer: Self-pay | Admitting: Internal Medicine

## 2014-03-28 NOTE — Telephone Encounter (Signed)
i called and spoke with the patient.  She di not increase her medication as directed.  She is still taking 100mg  in the am and 50mg  in the pm

## 2014-03-28 NOTE — Telephone Encounter (Signed)
Should this be 100mg  bid as noted in the 01/17/14 phone note or the 100mg  qam and 50mg  qpm as requested? Please advise. Thanks, MI

## 2014-04-11 ENCOUNTER — Ambulatory Visit (INDEPENDENT_AMBULATORY_CARE_PROVIDER_SITE_OTHER): Payer: Medicare Other

## 2014-04-11 ENCOUNTER — Ambulatory Visit (INDEPENDENT_AMBULATORY_CARE_PROVIDER_SITE_OTHER): Payer: Medicare Other | Admitting: Internal Medicine

## 2014-04-11 ENCOUNTER — Encounter (INDEPENDENT_AMBULATORY_CARE_PROVIDER_SITE_OTHER): Payer: Medicare Other

## 2014-04-11 ENCOUNTER — Encounter: Payer: Self-pay | Admitting: Radiology

## 2014-04-11 ENCOUNTER — Encounter: Payer: Self-pay | Admitting: Internal Medicine

## 2014-04-11 VITALS — BP 160/78 | HR 76 | Ht 65.5 in | Wt 161.8 lb

## 2014-04-11 DIAGNOSIS — I491 Atrial premature depolarization: Secondary | ICD-10-CM

## 2014-04-11 DIAGNOSIS — R002 Palpitations: Secondary | ICD-10-CM | POA: Diagnosis not present

## 2014-04-11 DIAGNOSIS — Z23 Encounter for immunization: Secondary | ICD-10-CM | POA: Diagnosis not present

## 2014-04-11 DIAGNOSIS — R0789 Other chest pain: Secondary | ICD-10-CM | POA: Diagnosis not present

## 2014-04-11 DIAGNOSIS — I10 Essential (primary) hypertension: Secondary | ICD-10-CM

## 2014-04-11 NOTE — Progress Notes (Signed)
HPI [name] [name] returns today for followup. She is a very pleasant 80 year old woman with a history of palpitations who has been on flecainide therapy for many years. No syncope. The patient has mild anxiety. She denies chest pain or shortness of breath. She remains a vegetarian. She relates 2 episodes of severe palpitations which occurred two months ago. Since then, she notes daily palpitations, but has not sought medical attention.    Current Outpatient Prescriptions  Medication Sig Dispense Refill  . aspirin 81 MG tablet Take 81 mg by mouth daily.    . Cholecalciferol (VITAMIN D3) 1000 UNITS CAPS Take 1,000 Units by mouth daily.     . flecainide (TAMBOCOR) 100 MG tablet TAKE 1 TABLET EVERY MORNING AND ONE-HALF (1/2) TABLET EVERY EVENING (Patient taking differently: TAKE 1 TABLET BY MOUTH EVERY MORNING AND ONE-HALF (1/2) TABLET BY MOUTH EVERY EVENING) 135 tablet 2  . LORazepam (ATIVAN) 1 MG tablet Take 0.25 mg by mouth daily as needed for anxiety.     No current facility-administered medications for this visit.     Past Medical History  Diagnosis Date  . Anemia     iron def  . Anxiety   . History of colonic polyps     cecal  . Osteoporosis 07/21/2011    t score -2.7  . Hypertension   . Arrhythmia     paroxysmal SVT and symtomatic PAC's  . GERD (gastroesophageal reflux disease)   . Stricture esophagus   . Diverticulosis     colon  . Vitamin D deficiency     2010    ROS:   All systems reviewed and negative except as noted in the HPI.   Past Surgical History  Procedure Laterality Date  . A&p, enterocele repair      s/p 2003  . Hemorrhoid surgery    . Breast reduction surgery      s/p 1996  . Abdominal hysterectomy       Family History  Problem Relation Age of Onset  . Lung cancer      2 cousins  . Colon cancer      remote relative  . Kidney disease      brother  . Stroke Father     @ 75  . Hypertension Father   . Hypertension Sister   . Cancer Sister 67   brain  . Stroke Mother     medication induced  . Breast cancer Other 60    breast  . Breast cancer Daughter   . Cancer Daughter 98    breast  . Cancer Other 49    colon/MELANOMA     History   Social History  . Marital Status: Married    Spouse Name: N/A    Number of Children: N/A  . Years of Education: N/A   Occupational History  . Retired    Social History Main Topics  . Smoking status: Former Smoker    Quit date: 05/24/1968  . Smokeless tobacco: Never Used     Comment: only smoked 7 years  . Alcohol Use: No  . Drug Use: No  . Sexual Activity: No   Other Topics Concern  . Not on file   Social History Narrative     BP 160/78 mmHg  Pulse 76  Ht 5' 5.5" (1.664 m)  Wt 161 lb 12.8 oz (73.392 kg)  BMI 26.51 kg/m2  LMP 05/24/1970  Physical Exam:  Well appearing elderly woman, NAD HEENT: Unremarkable Neck:  No JVD, no thyromegally  Back:  No CVA tenderness Lungs:  Clear with no wheezes, rales, or rhonchi. HEART:  Regular rate rhythm, no murmurs, no rubs, no clicks Abd:  soft, positive bowel sounds, no organomegally, no rebound, no guarding Ext:  2 plus pulses, no edema, no cyanosis, no clubbing Skin:  No rashes no nodules Neuro:  CN II through XII intact, motor grossly intact  EKG Normal sinus rhythm, normal axis and intervals.  Assess/Plan:

## 2014-04-11 NOTE — Assessment & Plan Note (Signed)
Her symptoms are well controlled. No change in meds.  

## 2014-04-11 NOTE — Assessment & Plan Note (Signed)
Her symptoms appear to have worsened. I have asked the patient to wear a cardiac monitor. I considered increasing her flecainide but will hold off for now. She is instructed to take an extra 50 mg of flecainide if her symptoms recur.

## 2014-04-11 NOTE — Progress Notes (Signed)
E cardio 48hr holter applied

## 2014-04-11 NOTE — Assessment & Plan Note (Signed)
Her blood pressure is elevated. She admits to sodium indiscretion. I have asked her to reduce her salt intake.

## 2014-04-11 NOTE — Patient Instructions (Signed)
Your physician wants you to follow-up in: 12 months with Dr. Knox Saliva will receive a reminder letter in the mail two months in advance. If you don't receive a letter, please call our office to schedule the follow-up appointment.   Your physician has recommended that you wear a holter monitor. Holter monitors are medical devices that record the heart's electrical activity. Doctors most often use these monitors to diagnose arrhythmias. Arrhythmias are problems with the speed or rhythm of the heartbeat. The monitor is a small, portable device. You can wear one while you do your normal daily activities. This is usually used to diagnose what is causing palpitations/syncope (passing out).

## 2014-04-16 ENCOUNTER — Encounter: Payer: Medicare Other | Admitting: Internal Medicine

## 2014-05-01 ENCOUNTER — Encounter: Payer: Self-pay | Admitting: Internal Medicine

## 2014-05-01 ENCOUNTER — Ambulatory Visit (INDEPENDENT_AMBULATORY_CARE_PROVIDER_SITE_OTHER): Payer: Medicare Other | Admitting: Internal Medicine

## 2014-05-01 ENCOUNTER — Ambulatory Visit: Payer: Medicare Other | Admitting: Family

## 2014-05-01 VITALS — BP 158/100 | HR 74 | Temp 97.9°F | Ht 67.0 in | Wt 163.0 lb

## 2014-05-01 DIAGNOSIS — J019 Acute sinusitis, unspecified: Secondary | ICD-10-CM | POA: Insufficient documentation

## 2014-05-01 DIAGNOSIS — J018 Other acute sinusitis: Secondary | ICD-10-CM | POA: Diagnosis not present

## 2014-05-01 DIAGNOSIS — F411 Generalized anxiety disorder: Secondary | ICD-10-CM

## 2014-05-01 DIAGNOSIS — I10 Essential (primary) hypertension: Secondary | ICD-10-CM

## 2014-05-01 MED ORDER — AZITHROMYCIN 250 MG PO TABS: ORAL_TABLET | ORAL | Status: DC

## 2014-05-01 MED ORDER — HYDROCODONE-HOMATROPINE 5-1.5 MG/5ML PO SYRP: 5.0000 mL | ORAL_SOLUTION | Freq: Four times a day (QID) | ORAL | Status: DC | PRN

## 2014-05-01 NOTE — Progress Notes (Signed)
Pre visit review using our clinic review tool, if applicable. No additional management support is needed unless otherwise documented below in the visit note. 

## 2014-05-01 NOTE — Progress Notes (Signed)
Subjective:    Patient ID: [name] [name], female    DOB: 08/19/1929, 80 y.o.   MRN: [id number]  HPI  Here with 2-3 days acute onset fever, facial pain, pressure, headache, general weakness and malaise, and greenish d/c, with mild ST and cough, but pt denies chest pain, wheezing, increased sob or doe, orthopnea, PND, increased LE swelling, palpitations, dizziness or syncope.  Also with right ear popping and crackling but no pain, tinnitus, dizziness or hearing loss. Pt denies new neurological symptoms such as new headache, or facial or extremity weakness or numbness   Pt denies polydipsia, polyuria. Denies worsening depressive symptoms, suicidal ideation, or panic Past Medical History  Diagnosis Date  . Anemia     iron def  . Anxiety   . History of colonic polyps     cecal  . Osteoporosis 07/21/2011    t score -2.7  . Hypertension   . Arrhythmia     paroxysmal SVT and symtomatic PAC's  . GERD (gastroesophageal reflux disease)   . Stricture esophagus   . Diverticulosis     colon  . Vitamin D deficiency     2010   Past Surgical History  Procedure Laterality Date  . A&p, enterocele repair      s/p 2003  . Hemorrhoid surgery    . Breast reduction surgery      s/p 1996  . Abdominal hysterectomy      reports that she quit smoking about 45 years ago. She has never used smokeless tobacco. She reports that she does not drink alcohol or use illicit drugs. family history includes Breast cancer in her daughter; Breast cancer (age of onset: 50) in her other; Cancer (age of onset: 60) in her other; Cancer (age of onset: 94) in her daughter; Cancer (age of onset: 83) in her sister; Colon cancer in an other family member; Hypertension in her father and sister; Kidney disease in an other family member; Lung cancer in an other family member; Stroke in her father and mother. Allergies  Allergen Reactions  . Zyrtec [Cetirizine]     Palpitations   Current Outpatient Prescriptions on File Prior to  Visit  Medication Sig Dispense Refill  . aspirin 81 MG tablet Take 81 mg by mouth daily.    . Cholecalciferol (VITAMIN D3) 1000 UNITS CAPS Take 1,000 Units by mouth daily.     . flecainide (TAMBOCOR) 100 MG tablet TAKE 1 TABLET EVERY MORNING AND ONE-HALF (1/2) TABLET EVERY EVENING (Patient taking differently: TAKE 1 TABLET BY MOUTH EVERY MORNING AND ONE-HALF (1/2) TABLET BY MOUTH EVERY EVENING) 135 tablet 2  . LORazepam (ATIVAN) 1 MG tablet Take 0.25 mg by mouth daily as needed for anxiety.     No current facility-administered medications on file prior to visit.   Review of Systems  Constitutional: Negative for unusual diaphoresis or other sweats  HENT: Negative for ringing in ear Eyes: Negative for double vision or worsening visual disturbance.  Respiratory: Negative for choking and stridor.   Gastrointestinal: Negative for vomiting or other signifcant bowel change Genitourinary: Negative for hematuria or decreased urine volume.  Musculoskeletal: Negative for other MSK pain or swelling Skin: Negative for color change and worsening wound.  Neurological: Negative for tremors and numbness other than noted  Psychiatric/Behavioral: Negative for decreased concentration or agitation other than above       Objective:   Physical Exam BP 158/100 mmHg  Pulse 74  Temp(Src) 97.9 F (36.6 C) (Oral)  Ht 5\' 7"  (1.702  m)  Wt 163 lb (73.936 kg)  BMI 25.52 kg/m2  SpO2 94%  LMP 05/24/1970 VS noted, mild ill Constitutional: Pt appears well-developed, well-nourished.  HENT: Head: NCAT.  Right Ear: External ear normal.  Left Ear: External ear normal.  Eyes: . Pupils are equal, round, and reactive to light. Conjunctivae and EOM are normal Bilat tm's with mild erythema.  Max sinus areas mild tender.  Pharynx with mild erythema, no exudate Neck: Normal range of motion. Neck supple.  Cardiovascular: Normal rate and regular rhythm.   Pulmonary/Chest: Effort normal and breath sounds normal.    Neurological: Pt is alert. Not confused , motor grossly intact Skin: Skin is warm. No rash Psychiatric: Pt behavior is normal. No agitation. mild nervous    Assessment & Plan:

## 2014-05-01 NOTE — Patient Instructions (Signed)
Please take all new medication as prescribed  - the antibiotic, and cough medicine  Please continue all other medications as before  Please have the pharmacy call with any other refills you may need.  Please keep your appointments with your specialists as you may have planned     

## 2014-05-05 NOTE — Assessment & Plan Note (Signed)
Mild to mod, for antibx course,  to f/u any worsening symptoms or concerns 

## 2014-05-05 NOTE — Assessment & Plan Note (Signed)
Uncontrolled today, but o/w stable overall by history and exam, recent data reviewed with pt, and pt declines change in tx today,  to f/u any worsening symptoms or concerns BP Readings from Last 3 Encounters:  05/01/14 158/100  04/11/14 160/78  02/07/14 132/72

## 2014-05-05 NOTE — Assessment & Plan Note (Signed)
stable overall by history and exam, decliens ssri trial today, and pt to continue medical treatment as before,  to f/u any worsening symptoms or concerns

## 2014-05-07 ENCOUNTER — Encounter: Payer: Medicare Other | Admitting: Internal Medicine

## 2014-07-28 ENCOUNTER — Emergency Department (HOSPITAL_COMMUNITY)
Admission: EM | Admit: 2014-07-28 | Discharge: 2014-07-28 | Disposition: A | Discharge status: Home / Self Care | Payer: Medicare Other | Attending: Emergency Medicine | Admitting: Emergency Medicine

## 2014-07-28 ENCOUNTER — Encounter (HOSPITAL_COMMUNITY): Payer: Self-pay | Admitting: *Deleted

## 2014-07-28 ENCOUNTER — Emergency Department (HOSPITAL_COMMUNITY): Payer: Medicare Other

## 2014-07-28 DIAGNOSIS — Z8739 Personal history of other diseases of the musculoskeletal system and connective tissue: Secondary | ICD-10-CM | POA: Insufficient documentation

## 2014-07-28 DIAGNOSIS — Z87891 Personal history of nicotine dependence: Secondary | ICD-10-CM | POA: Insufficient documentation

## 2014-07-28 DIAGNOSIS — Z79899 Other long term (current) drug therapy: Secondary | ICD-10-CM | POA: Insufficient documentation

## 2014-07-28 DIAGNOSIS — I499 Cardiac arrhythmia, unspecified: Secondary | ICD-10-CM | POA: Insufficient documentation

## 2014-07-28 DIAGNOSIS — E559 Vitamin D deficiency, unspecified: Secondary | ICD-10-CM | POA: Insufficient documentation

## 2014-07-28 DIAGNOSIS — F419 Anxiety disorder, unspecified: Secondary | ICD-10-CM | POA: Diagnosis not present

## 2014-07-28 DIAGNOSIS — I10 Essential (primary) hypertension: Secondary | ICD-10-CM | POA: Insufficient documentation

## 2014-07-28 DIAGNOSIS — R200 Anesthesia of skin: Secondary | ICD-10-CM | POA: Diagnosis not present

## 2014-07-28 DIAGNOSIS — Z86010 Personal history of colonic polyps: Secondary | ICD-10-CM | POA: Insufficient documentation

## 2014-07-28 DIAGNOSIS — Z862 Personal history of diseases of the blood and blood-forming organs and certain disorders involving the immune mechanism: Secondary | ICD-10-CM | POA: Insufficient documentation

## 2014-07-28 DIAGNOSIS — R251 Tremor, unspecified: Secondary | ICD-10-CM | POA: Insufficient documentation

## 2014-07-28 DIAGNOSIS — Z8719 Personal history of other diseases of the digestive system: Secondary | ICD-10-CM | POA: Insufficient documentation

## 2014-07-28 DIAGNOSIS — Z7982 Long term (current) use of aspirin: Secondary | ICD-10-CM | POA: Insufficient documentation

## 2014-07-28 LAB — URINALYSIS, ROUTINE W REFLEX MICROSCOPIC
Bilirubin Urine: NEGATIVE
Glucose, UA: NEGATIVE mg/dL
Hgb urine dipstick: NEGATIVE
Ketones, ur: NEGATIVE mg/dL
Nitrite: NEGATIVE
Protein, ur: NEGATIVE mg/dL
Specific Gravity, Urine: 1.006 (ref 1.005–1.030)
Urobilinogen, UA: 0.2 mg/dL (ref 0.0–1.0)
pH: 6.5 (ref 5.0–8.0)

## 2014-07-28 LAB — COMPREHENSIVE METABOLIC PANEL
ALT: 16 U/L (ref 0–35)
AST: 22 U/L (ref 0–37)
Albumin: 4.4 g/dL (ref 3.5–5.2)
Alkaline Phosphatase: 76 U/L (ref 39–117)
Anion gap: 5 (ref 5–15)
BUN: 12 mg/dL (ref 6–23)
CO2: 32 mmol/L (ref 19–32)
Calcium: 9.5 mg/dL (ref 8.4–10.5)
Chloride: 101 mmol/L (ref 96–112)
Creatinine, Ser: 0.8 mg/dL (ref 0.50–1.10)
GFR calc Af Amer: 77 mL/min — ABNORMAL LOW (ref >90)
GFR calc non Af Amer: 67 mL/min — ABNORMAL LOW (ref >90)
Glucose, Bld: 115 mg/dL — ABNORMAL HIGH (ref 70–99)
Potassium: 4.2 mmol/L (ref 3.5–5.1)
Sodium: 138 mmol/L (ref 135–145)
Total Bilirubin: 0.4 mg/dL (ref 0.3–1.2)
Total Protein: 7.5 g/dL (ref 6.0–8.3)

## 2014-07-28 LAB — URINE MICROSCOPIC-ADD ON

## 2014-07-28 LAB — CBC
HCT: 41.3 % (ref 36.0–46.0)
Hemoglobin: 13.7 g/dL (ref 12.0–15.0)
MCH: 30.9 pg (ref 26.0–34.0)
MCHC: 33.2 g/dL (ref 30.0–36.0)
MCV: 93 fL (ref 78.0–100.0)
Platelets: 295 10*3/uL (ref 150–400)
RBC: 4.44 MIL/uL (ref 3.87–5.11)
RDW: 13.9 % (ref 11.5–15.5)
WBC: 5 10*3/uL (ref 4.0–10.5)

## 2014-07-28 LAB — PROTIME-INR
INR: 1.07 (ref 0.00–1.49)
Prothrombin Time: 14 seconds (ref 11.6–15.2)

## 2014-07-28 LAB — DIFFERENTIAL
Basophils Absolute: 0 10*3/uL (ref 0.0–0.1)
Basophils Relative: 0 % (ref 0–1)
Eosinophils Absolute: 0.1 10*3/uL (ref 0.0–0.7)
Eosinophils Relative: 1 % (ref 0–5)
Lymphocytes Relative: 23 % (ref 12–46)
Lymphs Abs: 1.1 10*3/uL (ref 0.7–4.0)
Monocytes Absolute: 0.3 10*3/uL (ref 0.1–1.0)
Monocytes Relative: 6 % (ref 3–12)
Neutro Abs: 3.5 10*3/uL (ref 1.7–7.7)
Neutrophils Relative %: 70 % (ref 43–77)

## 2014-07-28 LAB — I-STAT TROPONIN, ED: Troponin i, poc: 0 ng/mL (ref 0.00–0.08)

## 2014-07-28 LAB — APTT: aPTT: 26 seconds (ref 24–37)

## 2014-07-28 LAB — TSH: TSH: 2.41 u[IU]/mL (ref 0.350–4.500)

## 2014-07-28 MED ORDER — LORAZEPAM 1 MG PO TABS
1.0000 mg | ORAL_TABLET | Freq: Once | ORAL | Status: AC
  Administered 2014-07-28: 1 mg via ORAL
  Filled 2014-07-28: qty 1

## 2014-07-28 NOTE — ED Notes (Signed)
Patient transported to x-ray. ?

## 2014-07-28 NOTE — ED Notes (Signed)
Patient returned from CT

## 2014-07-28 NOTE — ED Notes (Signed)
Pt reports lower back pain, having numbness sensation to bilateral legs and arms for days, had numbness to mouth and face yesterday which has resolved. Grips equal and no other neuro deficits noted at triage.

## 2014-07-28 NOTE — Discharge Instructions (Signed)

## 2014-08-02 ENCOUNTER — Telehealth: Payer: Self-pay | Admitting: *Deleted

## 2014-08-02 NOTE — Telephone Encounter (Signed)
Pt informed with the below note. 

## 2014-08-02 NOTE — Telephone Encounter (Signed)
See her primary physician 

## 2014-08-02 NOTE — Telephone Encounter (Signed)
Pt called c/o rectal discomfort for about 1 weeks now having bowl movements okay, no noted blood, states she went to the ER this past weekend because she was having tremors. I advise to see PCP, pt said she can never get in to see him. Pt would like recommendations. Please advise

## 2014-08-05 NOTE — ED Provider Notes (Signed)
CSN: [id number]     Arrival date & time 07/28/14  1155 History   First MD Initiated Contact with Patient 07/28/14 1248     Chief Complaint  Patient presents with  . Numbness     (Consider location/radiation/quality/duration/timing/severity/associated sxs/prior Treatment) HPI   81 year old female with multiple complaints, primarily new onset tremor. She has noticed intermittently for the past 2 weeks but more noticeable with the last day. Sometimes numbness around her mouth but not currently. No weakness. No visual changes. Does not feel off balance when she ambulates. Denies any acute pain. No change in mental status per family at bedside.  Past Medical History  Diagnosis Date  . Anemia     iron def  . Anxiety   . History of colonic polyps     cecal  . Osteoporosis 07/21/2011    t score -2.7  . Hypertension   . Arrhythmia     paroxysmal SVT and symtomatic PAC's  . GERD (gastroesophageal reflux disease)   . Stricture esophagus   . Diverticulosis     colon  . Vitamin D deficiency     2010   Past Surgical History  Procedure Laterality Date  . A&p, enterocele repair      s/p 2003  . Hemorrhoid surgery    . Breast reduction surgery      s/p 1996  . Abdominal hysterectomy     Family History  Problem Relation Age of Onset  . Lung cancer      2 cousins  . Colon cancer      remote relative  . Kidney disease      brother  . Stroke Father     @ 61  . Hypertension Father   . Hypertension Sister   . Cancer Sister 69    brain  . Stroke Mother     medication induced  . Breast cancer Other 36    breast  . Breast cancer Daughter   . Cancer Daughter 79    breast  . Cancer Other 43    colon/MELANOMA   History  Substance Use Topics  . Smoking status: Former Smoker    Quit date: 05/24/1968  . Smokeless tobacco: Never Used     Comment: only smoked 7 years  . Alcohol Use: No   OB History    Gravida Para Term Preterm AB TAB SAB Ectopic Multiple Living   2 2        2       Review of Systems  All systems reviewed and negative, other than as noted in HPI.   Allergies  Zyrtec  Home Medications   Prior to Admission medications   Medication Sig Start Date End Date Taking? Authorizing Provider  aspirin 325 MG tablet Take 325 mg by mouth daily.   Yes Historical Provider, MD  Cholecalciferol (VITAMIN D3) 1000 UNITS CAPS Take 1,000 Units by mouth daily.    Yes Historical Provider, MD  flecainide (TAMBOCOR) 100 MG tablet TAKE 1 TABLET EVERY MORNING AND ONE-HALF (1/2) TABLET EVERY EVENING Patient taking differently: TAKE 1 TABLET BY MOUTH EVERY MORNING AND ONE-HALF (1/2) TABLET BY MOUTH EVERY EVENING 03/28/14  Yes Evans Lance, MD  LORazepam (ATIVAN) 1 MG tablet Take 0.25 mg by mouth daily as needed for anxiety.   Yes Historical Provider, MD  azithromycin (ZITHROMAX Z-PAK) 250 MG tablet Use as directed Patient not taking: Reported on 07/28/2014 05/01/14   Biagio Borg, MD  HYDROcodone-homatropine [facility]) 5-1.5 MG/5ML syrup Take 5 mLs  by mouth every 6 (six) hours as needed for cough. Patient not taking: Reported on 07/28/2014 05/01/14   Biagio Borg, MD   BP 111/56 mmHg  Pulse 71  Temp(Src) 98.3 F (36.8 C) (Oral)  Resp 16  SpO2 95%  LMP 05/24/1970 Physical Exam  Constitutional: She is oriented to person, place, and time. She appears well-developed and well-nourished. No distress.  HENT:  Head: Normocephalic and atraumatic.  Eyes: Conjunctivae are normal. Right eye exhibits no discharge. Left eye exhibits no discharge.  Neck: Neck supple.  Cardiovascular: Normal rate, regular rhythm and normal heart sounds.  Exam reveals no gallop and no friction rub.   No murmur heard. Pulmonary/Chest: Effort normal and breath sounds normal. No respiratory distress.  Abdominal: Soft. She exhibits no distension. There is no tenderness.  Musculoskeletal: She exhibits no edema or tenderness.  Neurological: She is alert and oriented to person, place, and time. No cranial  nerve deficit. She exhibits normal muscle tone. Coordination normal.  Speech clear. Content appropriate.  Skin: Skin is warm and dry.  Psychiatric: She has a normal mood and affect. Her behavior is normal. Thought content normal.  Nursing note and vitals reviewed.   ED Course  Procedures (including critical care time) Labs Review Labs Reviewed  COMPREHENSIVE METABOLIC PANEL - Abnormal; Notable for the following:    Glucose, Bld 115 (*)    GFR calc non Af Amer 67 (*)    GFR calc Af Amer 77 (*)    All other components within normal limits  URINALYSIS, ROUTINE W REFLEX MICROSCOPIC - Abnormal; Notable for the following:    Leukocytes, UA TRACE (*)    All other components within normal limits  PROTIME-INR  APTT  CBC  DIFFERENTIAL  TSH  URINE MICROSCOPIC-ADD ON  I-STAT TROPOININ, ED    Imaging Review No results found.   EKG Interpretation   Date/Time:  Sunday July 28 2014 12:07:06 EST Ventricular Rate:  77 PR Interval:  196 QRS Duration: 96 QT Interval:  376 QTC Calculation: 425 R Axis:   66 Text Interpretation:  Normal sinus rhythm Low voltage QRS Borderline ECG  ED PHYSICIAN INTERPRETATION AVAILABLE IN [facility] Confirmed by  TEST, Record ([id number]) on 07/30/2014 7:39:12 AM      MDM   Final diagnoses:  Tremor    81 year old female with likely essential tremor. Neuro exam is nonfocal. Symptoms are diffuse. Doubt CVA or other serious neurological condition. She is afebrile. She generally appears well. Aside from her tremor, her neurological examination is nonfocal. It has been determined that no acute conditions requiring further emergency intervention are present at this time. The patient has been advised of the diagnosis and plan. I reviewed any labs and imaging including any potential incidental findings. We have discussed signs and symptoms that warrant return to the ED and they are listed in the discharge instructions.      Virgel Manifold, MD 08/05/14  [phone]

## 2014-08-07 ENCOUNTER — Ambulatory Visit (INDEPENDENT_AMBULATORY_CARE_PROVIDER_SITE_OTHER)
Admission: RE | Admit: 2014-08-07 | Discharge: 2014-08-07 | Disposition: A | Discharge status: Home / Self Care | Payer: Medicare Other | Source: Ambulatory Visit | Attending: Family | Admitting: Family

## 2014-08-07 ENCOUNTER — Ambulatory Visit: Payer: TRICARE For Life (TFL) | Admitting: Internal Medicine

## 2014-08-07 ENCOUNTER — Encounter: Payer: Self-pay | Admitting: Family

## 2014-08-07 ENCOUNTER — Ambulatory Visit (INDEPENDENT_AMBULATORY_CARE_PROVIDER_SITE_OTHER): Payer: Medicare Other | Admitting: Family

## 2014-08-07 VITALS — BP 122/86 | Temp 97.5°F | Wt 160.4 lb

## 2014-08-07 DIAGNOSIS — R300 Dysuria: Secondary | ICD-10-CM

## 2014-08-07 DIAGNOSIS — R1030 Lower abdominal pain, unspecified: Secondary | ICD-10-CM | POA: Diagnosis not present

## 2014-08-07 DIAGNOSIS — R109 Unspecified abdominal pain: Secondary | ICD-10-CM

## 2014-08-07 DIAGNOSIS — F411 Generalized anxiety disorder: Secondary | ICD-10-CM | POA: Diagnosis not present

## 2014-08-07 DIAGNOSIS — N760 Acute vaginitis: Secondary | ICD-10-CM

## 2014-08-07 DIAGNOSIS — R1010 Upper abdominal pain, unspecified: Secondary | ICD-10-CM | POA: Diagnosis not present

## 2014-08-07 LAB — POCT URINALYSIS DIPSTICK
Bilirubin, UA: NEGATIVE
Glucose, UA: NEGATIVE
Ketones, UA: NEGATIVE
Nitrite, UA: NEGATIVE
Protein, UA: NEGATIVE
Spec Grav, UA: <=1.005
Urobilinogen, UA: 0.2
pH, UA: 5.5

## 2014-08-07 MED ORDER — LORAZEPAM 1 MG PO TABS: 0.2500 mg | ORAL_TABLET | Freq: Every day | ORAL | Status: DC | PRN

## 2014-08-07 MED ORDER — NYSTATIN 100000 UNIT/GM EX CREA: 1.0000 "application " | TOPICAL_CREAM | Freq: Two times a day (BID) | CUTANEOUS | Status: DC

## 2014-08-07 NOTE — Patient Instructions (Signed)

## 2014-08-07 NOTE — Progress Notes (Signed)
Subjective:    Patient ID: [name] [name], female    DOB: 11-29-1929, 81 y.o.   MRN: [id number]  HPI 81 year old white female, nonsmoker,  Patient of Dr. Doug Sou is in today with with complaints of abdominal pain that occurred about a month ago and has continued to recur every few days that she rates up to a 9 out of 10. Reports health and her son in the yard hold up a tree and believes that this may have been the culprit a month ago. Pain is described to the lower abdomen is a burning sensation that radiates down into the vaginal area. Has been taken aspirin, drinking prune juice and carrot juice without much relief. Denies any nausea, vomiting, dark black stools or blood in her stools. Has difficulty with constipation at times but last bowel movement was this morning. Denies being sexually active   Review of Systems  Constitutional: Negative.   HENT: Negative.   Respiratory: Negative.   Cardiovascular: Negative.   Gastrointestinal: Positive for abdominal pain and constipation. Negative for blood in stool, abdominal distention and anal bleeding.  Endocrine: Negative.   Genitourinary: Positive for dysuria and pelvic pain. Negative for urgency and difficulty urinating.       Burning to the vagina externally  Musculoskeletal: Negative.   Skin: Negative.   Allergic/Immunologic: Negative.   Neurological: Negative.   Hematological: Negative.   Psychiatric/Behavioral: Negative.    Past Medical History  Diagnosis Date  . Anemia     iron def  . Anxiety   . History of colonic polyps     cecal  . Osteoporosis 07/21/2011    t score -2.7  . Hypertension   . Arrhythmia     paroxysmal SVT and symtomatic PAC's  . GERD (gastroesophageal reflux disease)   . Stricture esophagus   . Diverticulosis     colon  . Vitamin D deficiency     2010    History   Social History  . Marital Status: Married    Spouse Name: N/A  . Number of Children: N/A  . Years of Education: N/A   Occupational  History  . Retired    Social History Main Topics  . Smoking status: Former Smoker    Quit date: 05/24/1968  . Smokeless tobacco: Never Used     Comment: only smoked 7 years  . Alcohol Use: No  . Drug Use: No  . Sexual Activity: No   Other Topics Concern  . Not on file   Social History Narrative    Past Surgical History  Procedure Laterality Date  . A&p, enterocele repair      s/p 2003  . Hemorrhoid surgery    . Breast reduction surgery      s/p 1996  . Abdominal hysterectomy      Family History  Problem Relation Age of Onset  . Lung cancer      2 cousins  . Colon cancer      remote relative  . Kidney disease      brother  . Stroke Father     @ 41  . Hypertension Father   . Hypertension Sister   . Cancer Sister 57    brain  . Stroke Mother     medication induced  . Breast cancer Other 87    breast  . Breast cancer Daughter   . Cancer Daughter 60    breast  . Cancer Other 41    colon/MELANOMA  Allergies  Allergen Reactions  . Zyrtec [Cetirizine]     Palpitations    Current Outpatient Prescriptions on File Prior to Visit  Medication Sig Dispense Refill  . Cholecalciferol (VITAMIN D3) 1000 UNITS CAPS Take 1,000 Units by mouth daily.     . flecainide (TAMBOCOR) 100 MG tablet TAKE 1 TABLET EVERY MORNING AND ONE-HALF (1/2) TABLET EVERY EVENING (Patient taking differently: TAKE 1 TABLET BY MOUTH EVERY MORNING AND ONE-HALF (1/2) TABLET BY MOUTH EVERY EVENING) 135 tablet 2  . aspirin 325 MG tablet Take 325 mg by mouth daily.     No current facility-administered medications on file prior to visit.    BP 122/86 mmHg  Temp(Src) 97.5 F (36.4 C) (Oral)  Wt 160 lb 6.4 oz (72.757 kg)  LMP 01/01/1972chart     Objective:   Physical Exam  Constitutional: She is oriented to person, place, and time. She appears well-developed and well-nourished.  HENT:  Right Ear: External ear normal.  Left Ear: External ear normal.  Nose: Nose normal.  Mouth/Throat:  Oropharynx is clear and moist.  Neck: Normal range of motion. Neck supple.  Cardiovascular: Normal rate, regular rhythm and normal heart sounds.   Pulmonary/Chest: Effort normal and breath sounds normal.  Abdominal: Soft. Bowel sounds are normal. There is no tenderness. There is no rebound and no guarding.  Genitourinary: Vagina normal. Guaiac negative stool. No vaginal discharge found.  External labia majora erythematous.  Musculoskeletal: Normal range of motion.  Neurological: She is alert and oriented to person, place, and time.  Skin: Skin is warm and dry.  Psychiatric: She has a normal mood and affect.          Assessment & Plan:  [name] [name] was seen today for abdominal pain and dysuria.  Diagnoses and all orders for this visit:  Abdominal pain, unspecified abdominal location Orders: -     DG Abd 1 View; Future  Dysuria Orders: -     POCT urinalysis dipstick; Standing -     POCT urinalysis dipstick -     Culture, Urine  Vaginitis and vulvovaginitis  Anxiety state  Other orders -     nystatin cream (MYCOSTATIN); Apply 1 application topically 2 (two) times daily. -     LORazepam (ATIVAN) 1 MG tablet; Take 0.5 tablets (0.5 mg total) by mouth daily as needed for anxiety.   Nystatin prescribed due to erythema noted to the labia majora. Urine culture sent. We'll get a KUB of the abdomen to evaluate possible abdominal pain. Likely related to constipation. Continue drinking plenty of fluids and prune juice as needed. Follow-up pending the results of the KUB and sooner as needed. Refilled Xanax prescription today 30 days. Patient must see PCP in the future.

## 2014-08-07 NOTE — Progress Notes (Signed)
Pre visit review using our clinic review tool, if applicable. No additional management support is needed unless otherwise documented below in the visit note. 

## 2014-08-09 LAB — URINE CULTURE
Colony Count: NO GROWTH
Organism ID, Bacteria: NO GROWTH

## 2014-08-13 ENCOUNTER — Telehealth: Payer: Self-pay | Admitting: *Deleted

## 2014-08-13 ENCOUNTER — Telehealth: Payer: Self-pay | Admitting: Internal Medicine

## 2014-08-13 NOTE — Telephone Encounter (Signed)
Patient is requesting to know results to her labs and imaging done on 08/07/14.  Padonda ordered the tests but will not be back in the office until Thursday, can you please have someone contact the patient with her results?

## 2014-08-13 NOTE — Telephone Encounter (Signed)
Urine culture without bacterial growth or infection.

## 2014-08-15 NOTE — Telephone Encounter (Signed)
Patient informed of the results and advised to follow up with Dr Doug Sou.

## 2014-08-23 ENCOUNTER — Ambulatory Visit (INDEPENDENT_AMBULATORY_CARE_PROVIDER_SITE_OTHER): Payer: Medicare Other | Admitting: Internal Medicine

## 2014-08-23 ENCOUNTER — Encounter: Payer: Self-pay | Admitting: Internal Medicine

## 2014-08-23 VITALS — BP 142/78 | HR 70 | Temp 98.0°F | Resp 12 | Wt 161.0 lb

## 2014-08-23 DIAGNOSIS — N9489 Other specified conditions associated with female genital organs and menstrual cycle: Secondary | ICD-10-CM | POA: Diagnosis not present

## 2014-08-23 DIAGNOSIS — N898 Other specified noninflammatory disorders of vagina: Secondary | ICD-10-CM

## 2014-08-23 MED ORDER — METRONIDAZOLE 0.75 % VA GEL: 1.0000 | Freq: Two times a day (BID) | VAGINAL | Status: DC

## 2014-08-23 MED ORDER — HYDROCORTISONE ACETATE 25 MG RE SUPP: 25.0000 mg | Freq: Two times a day (BID) | RECTAL | Status: DC

## 2014-08-23 NOTE — Patient Instructions (Signed)
We have sent in vaginal gel called metronidazole which you will use inside the vagina twice a day for 5-7 days. You should start feeling better in the first 3-4 days.   We have also sent in the suppositories for the hemorrhoids if you need them.

## 2014-08-23 NOTE — Assessment & Plan Note (Signed)
Treat for BV as sounds most likely. She does have gyn apt on 4/22 and if still having problems she will let them do full exam. Treat with metronidazole gel for 7 days to treat. She is done with the yeast treatment and not having any discharge. No external pain or lesion on the genitals.

## 2014-08-23 NOTE — Progress Notes (Signed)
   Subjective:    Patient ID: [name] [name], female    DOB: 05/28/1929, 81 y.o.   MRN: [id number]  HPI The patient is an 81 YO female who is coming in to follow up on some vaginal burning. She was seen and given a yeast cream for her vagina which has helped some. She now has noticed a fishy odor and still having some problems. She does not have burning when she pees or pain in her stomach. Denies any sexual activity in many years. Denies fevers, chills, rash. Denies any discharge.   Review of Systems  Constitutional: Negative for fever, chills, activity change, appetite change, fatigue and unexpected weight change.  Gastrointestinal: Negative.   Genitourinary: Negative for urgency, frequency, flank pain, vaginal bleeding, vaginal discharge, difficulty urinating, genital sores and pelvic pain.       Vaginal odor  Neurological: Negative.       Objective:   Physical Exam  Constitutional: She is oriented to person, place, and time. She appears well-developed and well-nourished.  Cardiovascular: Normal rate and regular rhythm.   Pulmonary/Chest: Effort normal and breath sounds normal.  Abdominal: Soft. Bowel sounds are normal. She exhibits no distension.  Genitourinary:  Vaginal exam deferred by patient as had one 2 weeks ago for same problem  Neurological: She is alert and oriented to person, place, and time.   Filed Vitals:   08/23/14 1037  BP: 142/78  Pulse: 70  Temp: 98 F (36.7 C)  TempSrc: Oral  Resp: 12  Weight: 161 lb (73.029 kg)  SpO2: 96%      Assessment & Plan:

## 2014-08-23 NOTE — Progress Notes (Signed)
Pre visit review using our clinic review tool, if applicable. No additional management support is needed unless otherwise documented below in the visit note. 

## 2014-08-31 ENCOUNTER — Encounter (HOSPITAL_COMMUNITY): Payer: Self-pay | Admitting: Emergency Medicine

## 2014-08-31 ENCOUNTER — Emergency Department (HOSPITAL_COMMUNITY): Payer: Medicare Other

## 2014-08-31 ENCOUNTER — Emergency Department (HOSPITAL_COMMUNITY)
Admission: EM | Admit: 2014-08-31 | Discharge: 2014-08-31 | Disposition: A | Discharge status: Home / Self Care | Payer: Medicare Other | Attending: Emergency Medicine | Admitting: Emergency Medicine

## 2014-08-31 DIAGNOSIS — I10 Essential (primary) hypertension: Secondary | ICD-10-CM | POA: Diagnosis not present

## 2014-08-31 DIAGNOSIS — F419 Anxiety disorder, unspecified: Secondary | ICD-10-CM | POA: Insufficient documentation

## 2014-08-31 DIAGNOSIS — Z862 Personal history of diseases of the blood and blood-forming organs and certain disorders involving the immune mechanism: Secondary | ICD-10-CM | POA: Diagnosis not present

## 2014-08-31 DIAGNOSIS — Z79899 Other long term (current) drug therapy: Secondary | ICD-10-CM | POA: Diagnosis not present

## 2014-08-31 DIAGNOSIS — Z86010 Personal history of colonic polyps: Secondary | ICD-10-CM | POA: Diagnosis not present

## 2014-08-31 DIAGNOSIS — E559 Vitamin D deficiency, unspecified: Secondary | ICD-10-CM | POA: Insufficient documentation

## 2014-08-31 DIAGNOSIS — Z87891 Personal history of nicotine dependence: Secondary | ICD-10-CM | POA: Insufficient documentation

## 2014-08-31 DIAGNOSIS — R1084 Generalized abdominal pain: Secondary | ICD-10-CM | POA: Insufficient documentation

## 2014-08-31 DIAGNOSIS — Z8719 Personal history of other diseases of the digestive system: Secondary | ICD-10-CM | POA: Diagnosis not present

## 2014-08-31 DIAGNOSIS — D1771 Benign lipomatous neoplasm of kidney: Secondary | ICD-10-CM | POA: Diagnosis not present

## 2014-08-31 DIAGNOSIS — R109 Unspecified abdominal pain: Secondary | ICD-10-CM | POA: Diagnosis not present

## 2014-08-31 DIAGNOSIS — Z7982 Long term (current) use of aspirin: Secondary | ICD-10-CM | POA: Diagnosis not present

## 2014-08-31 LAB — URINALYSIS, ROUTINE W REFLEX MICROSCOPIC
Bilirubin Urine: NEGATIVE
Glucose, UA: NEGATIVE mg/dL
Hgb urine dipstick: NEGATIVE
Ketones, ur: NEGATIVE mg/dL
Nitrite: NEGATIVE
Protein, ur: NEGATIVE mg/dL
Specific Gravity, Urine: 1.006 (ref 1.005–1.030)
Urobilinogen, UA: 0.2 mg/dL (ref 0.0–1.0)
pH: 8 (ref 5.0–8.0)

## 2014-08-31 LAB — COMPREHENSIVE METABOLIC PANEL
ALT: 22 U/L (ref 0–35)
AST: 26 U/L (ref 0–37)
Albumin: 4.5 g/dL (ref 3.5–5.2)
Alkaline Phosphatase: 74 U/L (ref 39–117)
Anion gap: 13 (ref 5–15)
BUN: 12 mg/dL (ref 6–23)
CO2: 24 mmol/L (ref 19–32)
Calcium: 9.7 mg/dL (ref 8.4–10.5)
Chloride: 102 mmol/L (ref 96–112)
Creatinine, Ser: 0.81 mg/dL (ref 0.50–1.10)
GFR calc Af Amer: 76 mL/min — ABNORMAL LOW (ref >90)
GFR calc non Af Amer: 66 mL/min — ABNORMAL LOW (ref >90)
Glucose, Bld: 99 mg/dL (ref 70–99)
Potassium: 3.5 mmol/L (ref 3.5–5.1)
Sodium: 139 mmol/L (ref 135–145)
Total Bilirubin: 0.6 mg/dL (ref 0.3–1.2)
Total Protein: 7.6 g/dL (ref 6.0–8.3)

## 2014-08-31 LAB — LIPASE, BLOOD: Lipase: 28 U/L (ref 11–59)

## 2014-08-31 LAB — CBC WITH DIFFERENTIAL/PLATELET
Basophils Absolute: 0 10*3/uL (ref 0.0–0.1)
Basophils Relative: 1 % (ref 0–1)
Eosinophils Absolute: 0 10*3/uL (ref 0.0–0.7)
Eosinophils Relative: 1 % (ref 0–5)
HCT: 43 % (ref 36.0–46.0)
Hemoglobin: 14.2 g/dL (ref 12.0–15.0)
Lymphocytes Relative: 27 % (ref 12–46)
Lymphs Abs: 1.5 10*3/uL (ref 0.7–4.0)
MCH: 30.9 pg (ref 26.0–34.0)
MCHC: 33 g/dL (ref 30.0–36.0)
MCV: 93.5 fL (ref 78.0–100.0)
Monocytes Absolute: 0.4 10*3/uL (ref 0.1–1.0)
Monocytes Relative: 8 % (ref 3–12)
Neutro Abs: 3.6 10*3/uL (ref 1.7–7.7)
Neutrophils Relative %: 63 % (ref 43–77)
Platelets: 315 10*3/uL (ref 150–400)
RBC: 4.6 MIL/uL (ref 3.87–5.11)
RDW: 13.7 % (ref 11.5–15.5)
WBC: 5.6 10*3/uL (ref 4.0–10.5)

## 2014-08-31 LAB — I-STAT TROPONIN, ED: Troponin i, poc: 0 ng/mL (ref 0.00–0.08)

## 2014-08-31 LAB — URINE MICROSCOPIC-ADD ON

## 2014-08-31 MED ORDER — IOHEXOL 300 MG/ML  SOLN
25.0000 mL | Freq: Once | INTRAMUSCULAR | Status: AC | PRN
  Administered 2014-08-31: 25 mL via ORAL

## 2014-08-31 MED ORDER — IOHEXOL 300 MG/ML  SOLN
100.0000 mL | Freq: Once | INTRAMUSCULAR | Status: AC | PRN
  Administered 2014-08-31: 100 mL via INTRAVENOUS

## 2014-08-31 MED ORDER — SODIUM CHLORIDE 0.9 % IV BOLUS (SEPSIS)
1000.0000 mL | Freq: Once | INTRAVENOUS | Status: AC
  Administered 2014-08-31: 1000 mL via INTRAVENOUS

## 2014-08-31 NOTE — ED Provider Notes (Signed)
CSN: [id number]     Arrival date & time 08/31/14  1319 History   First MD Initiated Contact with Patient 08/31/14 1319     Chief Complaint  Patient presents with  . Abdominal Pain    HPI  Patient presents with concern of abdominal pain, now largely resolved. Patient has had episodes for the past few days, but earlier today the patient had an episode of severe pain in the left lower quadrant. As she has had episodes of pain over the past 2 days she's been taking increased amounts of laxatives. She continues to have bowel movements, without loose stool, no vomiting. No fever, chills. Patient has a recent episode of vaginal yeast infection, but denies any urinary complaints. No relief with anything other than rest after a bowel movement. Patient has a history of hysterectomy.   Past Medical History  Diagnosis Date  . Anemia     iron def  . Anxiety   . History of colonic polyps     cecal  . Osteoporosis 07/21/2011    t score -2.7  . Hypertension   . Arrhythmia     paroxysmal SVT and symtomatic PAC's  . GERD (gastroesophageal reflux disease)   . Stricture esophagus   . Diverticulosis     colon  . Vitamin D deficiency     2010   Past Surgical History  Procedure Laterality Date  . A&p, enterocele repair      s/p 2003  . Hemorrhoid surgery    . Breast reduction surgery      s/p 1996  . Abdominal hysterectomy     Family History  Problem Relation Age of Onset  . Lung cancer      2 cousins  . Colon cancer      remote relative  . Kidney disease      brother  . Stroke Father     @ 85  . Hypertension Father   . Hypertension Sister   . Cancer Sister 26    brain  . Stroke Mother     medication induced  . Breast cancer Other 65    breast  . Breast cancer Daughter   . Cancer Daughter 92    breast  . Cancer Other 13    colon/MELANOMA   History  Substance Use Topics  . Smoking status: Former Smoker    Quit date: 05/24/1968  . Smokeless tobacco: Never Used   Comment: only smoked 7 years  . Alcohol Use: No   OB History    Gravida Para Term Preterm AB TAB SAB Ectopic Multiple Living   2 2        2      Review of Systems  Constitutional:       Per HPI, otherwise negative  HENT:       Per HPI, otherwise negative  Respiratory:       Per HPI, otherwise negative  Cardiovascular:       Per HPI, otherwise negative  Gastrointestinal: Negative for vomiting.  Endocrine:       Negative aside from HPI  Genitourinary:       Neg aside from HPI   Musculoskeletal:       Per HPI, otherwise negative  Skin: Negative.   Neurological: Negative for syncope.      Allergies  Zyrtec  Home Medications   Prior to Admission medications   Medication Sig Start Date End Date Taking? Authorizing Provider  aspirin 325 MG tablet Take 325 mg by mouth  daily.    Historical Provider, MD  Cholecalciferol (VITAMIN D3) 1000 UNITS CAPS Take 1,000 Units by mouth daily.     Historical Provider, MD  flecainide (TAMBOCOR) 100 MG tablet TAKE 1 TABLET EVERY MORNING AND ONE-HALF (1/2) TABLET EVERY EVENING Patient taking differently: TAKE 1 TABLET BY MOUTH EVERY MORNING AND ONE-HALF (1/2) TABLET BY MOUTH EVERY EVENING 03/28/14   Evans Lance, MD  hydrocortisone (ANUSOL-HC) 25 MG suppository Place 1 suppository (25 mg total) rectally 2 (two) times daily. 08/23/14   Olga Millers, MD  LORazepam (ATIVAN) 1 MG tablet Take 0.5 tablets (0.5 mg total) by mouth daily as needed for anxiety. 08/07/14   Timoteo Gaul, FNP  metroNIDAZOLE (METROGEL) 0.75 % vaginal gel Place 1 Applicatorful vaginally 2 (two) times daily. 08/23/14   Olga Millers, MD  nystatin cream (MYCOSTATIN) Apply 1 application topically 2 (two) times daily. 08/07/14   Timoteo Gaul, FNP   BP 160/79 mmHg  Pulse 81  Temp(Src) 97.8 F (36.6 C) (Oral)  Resp 16  SpO2 95%  LMP 05/24/1970 Physical Exam  Constitutional: She is oriented to person, place, and time. She appears well-developed and  well-nourished. No distress.  HENT:  Head: Normocephalic and atraumatic.  Eyes: Conjunctivae and EOM are normal.  Cardiovascular: Normal rate and regular rhythm.   Pulmonary/Chest: Effort normal and breath sounds normal. No stridor. No respiratory distress.  Abdominal: She exhibits no distension.  Soft, nondistended abdomen with tenderness to palpation throughout the left lower quadrant.  Musculoskeletal: She exhibits no edema.  Neurological: She is alert and oriented to person, place, and time. No cranial nerve deficit.  Skin: Skin is warm and dry.  Psychiatric: She has a normal mood and affect.  Nursing note and vitals reviewed.   ED Course  Procedures (including critical care time) Labs Review Labs Reviewed  COMPREHENSIVE METABOLIC PANEL - Abnormal; Notable for the following:    GFR calc non Af Amer 66 (*)    GFR calc Af Amer 76 (*)    All other components within normal limits  URINALYSIS, ROUTINE W REFLEX MICROSCOPIC - Abnormal; Notable for the following:    APPearance CLOUDY (*)    Leukocytes, UA MODERATE (*)    All other components within normal limits  URINE MICROSCOPIC-ADD ON - Abnormal; Notable for the following:    Squamous Epithelial / LPF FEW (*)    All other components within normal limits  CBC WITH DIFFERENTIAL/PLATELET  LIPASE, BLOOD  I-STAT TROPOININ, ED    Imaging Review Ct Abdomen Pelvis W Contrast  08/31/2014   CLINICAL DATA:  Left lower quadrant abdominal pain since yesterday  EXAM: CT ABDOMEN AND PELVIS WITH CONTRAST  TECHNIQUE: Multidetector CT imaging of the abdomen and pelvis was performed using the standard protocol following bolus administration of intravenous contrast.  CONTRAST:  171mL OMNIPAQUE IOHEXOL 300 MG/ML  SOLN  COMPARISON:  None.  FINDINGS: Lower chest: There is no pleural or pericardial effusion identified. The lung bases appear clear.  Hepatobiliary: No suspicious liver abnormalities identified. The gallbladder appears normal. There is no  biliary dilatation.  Pancreas: Normal  Spleen: Normal  Adrenals/Urinary Tract: Normal adrenal glands. Left upper pole AML is identified measuring 1.1 cm, image 87 of series 5. The kidneys are otherwise normal. The urinary bladder appears within normal limits.  Stomach/Bowel: The stomach is within normal limits. The small bowel loops have a normal course and caliber. No obstruction. Normal appearance of the colon. Numerous distal colonic diverticula identified. There is no  acute inflammation.  Vascular/Lymphatic: Calcified atherosclerotic disease involves the abdominal aorta. No aneurysm. No enlarged retroperitoneal or mesenteric adenopathy. No enlarged pelvic or inguinal lymph nodes.  Reproductive: Status post hysterectomy.  Other: There is no ascites or focal fluid collections within the abdomen or pelvis.  Musculoskeletal: Review of the visualized osseous structures is negative for aggressive lytic or sclerotic bone lesion.  IMPRESSION: 1. No acute findings identified within the abdomen or pelvis. No explanation for patient's left lower quadrant pain. Specifically, there is no evidence for acute diverticulitis. 2. Atherosclerotic disease. 3. Left kidney AML.   Electronically Signed   By: Kerby Moors M.D.   On: 08/31/2014 15:58    On repeat exam the patient is calm, in no distress, states that she feels better. We discussed return precautions, follow-up instructions, including seeing both her gynecologist and her gastroenterologist in the next few days.   MDM   Final diagnoses:  Generalized abdominal pain   patient presents after episodes of left lower quadrant pain.  Patient has a history of abdominal surgery, and obstruction versus diverticulitis are both considerations. No evidence for mass or obstruction on CT scan. Patient is afebrile, there is low suspicion for sepsis or bacteremia. Patient has positive leukocytes in her urine, but no urinary complaints, and has a presumed diagnosed cutaneous  infection. Patient will follow up with gynecology as scheduled next week, and gastroenterology as well.   Carmin Muskrat, MD 08/31/14 [phone]

## 2014-08-31 NOTE — Discharge Instructions (Signed)
As discussed, your evaluation today has been largely reassuring.  But, it is important that you monitor your condition carefully, and do not hesitate to return to the ED if you develop new, or concerning changes in your condition.  Otherwise, please follow-up with your physicians for appropriate ongoing care.     Abdominal Pain, Women Abdominal (stomach, pelvic, or belly) pain can be caused by many things. It is important to tell your doctor:  The location of the pain.  Does it come and go or is it present all the time?  Are there things that start the pain (eating certain foods, exercise)?  Are there other symptoms associated with the pain (fever, nausea, vomiting, diarrhea)? All of this is helpful to know when trying to find the cause of the pain. CAUSES   Stomach: virus or bacteria infection, or ulcer.  Intestine: appendicitis (inflamed appendix), regional ileitis (Crohn's disease), ulcerative colitis (inflamed colon), irritable bowel syndrome, diverticulitis (inflamed diverticulum of the colon), or cancer of the stomach or intestine.  Gallbladder disease or stones in the gallbladder.  Kidney disease, kidney stones, or infection.  Pancreas infection or cancer.  Fibromyalgia (pain disorder).  Diseases of the female organs:  Uterus: fibroid (non-cancerous) tumors or infection.  Fallopian tubes: infection or tubal pregnancy.  Ovary: cysts or tumors.  Pelvic adhesions (scar tissue).  Endometriosis (uterus lining tissue growing in the pelvis and on the pelvic organs).  Pelvic congestion syndrome (female organs filling up with blood just before the menstrual period).  Pain with the menstrual period.  Pain with ovulation (producing an egg).  Pain with an IUD (intrauterine device, birth control) in the uterus.  Cancer of the female organs.  Functional pain (pain not caused by a disease, may improve without treatment).  Psychological pain.  Depression. DIAGNOSIS    Your doctor will decide the seriousness of your pain by doing an examination.  Blood tests.  X-rays.  Ultrasound.  CT scan (computed tomography, special type of X-ray).  MRI (magnetic resonance imaging).  Cultures, for infection.  Barium enema (dye inserted in the large intestine, to better view it with X-rays).  Colonoscopy (looking in intestine with a lighted tube).  Laparoscopy (minor surgery, looking in abdomen with a lighted tube).  Major abdominal exploratory surgery (looking in abdomen with a large incision). TREATMENT  The treatment will depend on the cause of the pain.   Many cases can be observed and treated at home.  Over-the-counter medicines recommended by your caregiver.  Prescription medicine.  Antibiotics, for infection.  Birth control pills, for painful periods or for ovulation pain.  Hormone treatment, for endometriosis.  Nerve blocking injections.  Physical therapy.  Antidepressants.  Counseling with a psychologist or psychiatrist.  Minor or major surgery. HOME CARE INSTRUCTIONS   Do not take laxatives, unless directed by your caregiver.  Take over-the-counter pain medicine only if ordered by your caregiver. Do not take aspirin because it can cause an upset stomach or bleeding.  Try a clear liquid diet (broth or water) as ordered by your caregiver. Slowly move to a bland diet, as tolerated, if the pain is related to the stomach or intestine.  Have a thermometer and take your temperature several times a day, and record it.  Bed rest and sleep, if it helps the pain.  Avoid sexual intercourse, if it causes pain.  Avoid stressful situations.  Keep your follow-up appointments and tests, as your caregiver orders.  If the pain does not go away with medicine or surgery,  you may try:  Acupuncture.  Relaxation exercises (yoga, meditation).  Group therapy.  Counseling. SEEK MEDICAL CARE IF:   You notice certain foods cause stomach  pain.  Your home care treatment is not helping your pain.  You need stronger pain medicine.  You want your IUD removed.  You feel faint or lightheaded.  You develop nausea and vomiting.  You develop a rash.  You are having side effects or an allergy to your medicine. SEEK IMMEDIATE MEDICAL CARE IF:   Your pain does not go away or gets worse.  You have a fever.  Your pain is felt only in portions of the abdomen. The right side could possibly be appendicitis. The left lower portion of the abdomen could be colitis or diverticulitis.  You are passing blood in your stools (bright red or black tarry stools, with or without vomiting).  You have blood in your urine.  You develop chills, with or without a fever.  You pass out. MAKE SURE YOU:   Understand these instructions.  Will watch your condition.  Will get help right away if you are not doing well or get worse. Document Released: 03/07/2007 Document Revised: 09/24/2013 Document Reviewed: 03/27/2009 [facility] Patient Information 2015 [place], [place]. This information is not intended to replace advice given to you by your health care provider. Make sure you discuss any questions you have with your health care provider.

## 2014-08-31 NOTE — ED Notes (Signed)
Patient transported to CT 

## 2014-08-31 NOTE — ED Notes (Signed)
Per PTAR: Pt from home for eval of abd pain that started yesterday, pt LBM was yesterday am and has taken laxatives last pm and this am because she believed she was constipated. Pt denies any n/v/d at this time, alert and oriented. Pt states when walking abdomen becomes hard and she has some sob. nad noted.

## 2014-08-31 NOTE — ED Notes (Signed)
CT called and notified that pt finished contrast.

## 2014-09-02 ENCOUNTER — Other Ambulatory Visit: Payer: Self-pay

## 2014-09-02 DIAGNOSIS — Z1231 Encounter for screening mammogram for malignant neoplasm of breast: Secondary | ICD-10-CM

## 2014-09-07 ENCOUNTER — Emergency Department (INDEPENDENT_AMBULATORY_CARE_PROVIDER_SITE_OTHER)
Admission: EM | Admit: 2014-09-07 | Discharge: 2014-09-07 | Disposition: A | Discharge status: Home / Self Care | Payer: Medicare Other | Source: Home / Self Care | Attending: Emergency Medicine | Admitting: Emergency Medicine

## 2014-09-07 ENCOUNTER — Encounter (HOSPITAL_COMMUNITY): Payer: Self-pay | Admitting: Emergency Medicine

## 2014-09-07 DIAGNOSIS — L989 Disorder of the skin and subcutaneous tissue, unspecified: Secondary | ICD-10-CM

## 2014-09-07 NOTE — ED Provider Notes (Signed)
CSN: [id number]     Arrival date & time 09/07/14  1127 History   First MD Initiated Contact with Patient 09/07/14 1148     Chief Complaint  Patient presents with  . Abscess   (Consider location/radiation/quality/duration/timing/severity/associated sxs/prior Treatment) HPI  She is an 81 year old woman here for evaluation of a skin lesion. It is located on her left forearm. She states is has been present 2-3 weeks. She states it is a little tender. She is not sure how it started, but she does think it is getting firmer and the redness is spreading a little bit. She has been applying antibiotic ointment and hydrocortisone without improvement. She was wondering if it was a spider bite, but she denies any known bite. No known injury or trauma.  Past Medical History  Diagnosis Date  . Anemia     iron def  . Anxiety   . History of colonic polyps     cecal  . Osteoporosis 07/21/2011    t score -2.7  . Hypertension   . Arrhythmia     paroxysmal SVT and symtomatic PAC's  . GERD (gastroesophageal reflux disease)   . Stricture esophagus   . Diverticulosis     colon  . Vitamin D deficiency     2010   Past Surgical History  Procedure Laterality Date  . A&p, enterocele repair      s/p 2003  . Hemorrhoid surgery    . Breast reduction surgery      s/p 1996  . Abdominal hysterectomy     Family History  Problem Relation Age of Onset  . Lung cancer      2 cousins  . Colon cancer      remote relative  . Kidney disease      brother  . Stroke Father     @ 75  . Hypertension Father   . Hypertension Sister   . Cancer Sister 49    brain  . Stroke Mother     medication induced  . Breast cancer Other 24    breast  . Breast cancer Daughter   . Cancer Daughter 68    breast  . Cancer Other 75    colon/MELANOMA   History  Substance Use Topics  . Smoking status: Former Smoker    Quit date: 05/24/1968  . Smokeless tobacco: Never Used     Comment: only smoked 7 years  . Alcohol Use:  No   OB History    Gravida Para Term Preterm AB TAB SAB Ectopic Multiple Living   2 2        2      Review of Systems As in history of present illness Allergies  Zyrtec  Home Medications   Prior to Admission medications   Medication Sig Start Date End Date Taking? Authorizing Provider  flecainide (TAMBOCOR) 100 MG tablet TAKE 1 TABLET EVERY MORNING AND ONE-HALF (1/2) TABLET EVERY EVENING Patient taking differently: TAKE 1 TABLET BY MOUTH EVERY MORNING AND ONE-HALF (1/2) TABLET BY MOUTH EVERY EVENING 03/28/14  Yes Evans Lance, MD  aspirin 325 MG tablet Take 325 mg by mouth daily.    Historical Provider, MD  Cholecalciferol (VITAMIN D3) 1000 UNITS CAPS Take 1,000 Units by mouth daily.     Historical Provider, MD  hydrocortisone (ANUSOL-HC) 25 MG suppository Place 1 suppository (25 mg total) rectally 2 (two) times daily. 08/23/14   Olga Millers, MD  LORazepam (ATIVAN) 1 MG tablet Take 0.5 tablets (0.5 mg total) by  mouth daily as needed for anxiety. 08/07/14   Timoteo Gaul, FNP  metroNIDAZOLE (METROGEL) 0.75 % vaginal gel Place 1 Applicatorful vaginally 2 (two) times daily. 08/23/14   Olga Millers, MD  nystatin cream (MYCOSTATIN) Apply 1 application topically 2 (two) times daily. 08/07/14   Timoteo Gaul, FNP   BP 164/83 mmHg  Pulse 71  Temp(Src) 98 F (36.7 C) (Oral)  Resp 18  SpO2 97%  LMP 05/24/1970 Physical Exam  Constitutional: She is oriented to person, place, and time. She appears well-developed and well-nourished. No distress.  Cardiovascular: Normal rate.   Pulmonary/Chest: Effort normal.  Neurological: She is alert and oriented to person, place, and time.  Skin:  1 cm firm nodule with central scabbing on left forearm. There is some surrounding erythema. This is in the setting of chronic skin damaged skin.    ED Course  Procedures (including critical care time) Labs Review Labs Reviewed - No data to display  Imaging Review No results  found.   MDM   1. Skin lesion    I'm concerned for squamous cell carcinoma. Provided contact information for Dr. Allyson Sabal in dermatology. She will call him for an appointment Monday morning.    Melony Overly, MD 09/07/14 [phone]

## 2014-09-07 NOTE — ED Notes (Signed)
C/o small abscess, size of a dime, to left forearm onset 2-3 weeks Believes it maybe due to a spider bite but not sure; denies inj/trauma Sx include swelling, tenderness, redness Denies fevers Alert, no signs of acute distress.

## 2014-09-07 NOTE — Discharge Instructions (Signed)
I am worried that the spot on your arm is an early skin cancer. Continue applying antibiotic ointment. Please make an appointment to see Dr. Allyson Sabal as soon as possible for evaluation.

## 2014-09-13 ENCOUNTER — Encounter: Payer: Self-pay | Admitting: Gynecology

## 2014-09-13 ENCOUNTER — Ambulatory Visit (INDEPENDENT_AMBULATORY_CARE_PROVIDER_SITE_OTHER): Payer: Medicare Other | Admitting: Gynecology

## 2014-09-13 VITALS — BP 120/74 | Ht 65.0 in | Wt 160.0 lb

## 2014-09-13 DIAGNOSIS — Z01419 Encounter for gynecological examination (general) (routine) without abnormal findings: Secondary | ICD-10-CM

## 2014-09-13 DIAGNOSIS — N898 Other specified noninflammatory disorders of vagina: Secondary | ICD-10-CM | POA: Diagnosis not present

## 2014-09-13 DIAGNOSIS — M810 Age-related osteoporosis without current pathological fracture: Secondary | ICD-10-CM | POA: Diagnosis not present

## 2014-09-13 DIAGNOSIS — N952 Postmenopausal atrophic vaginitis: Secondary | ICD-10-CM

## 2014-09-13 LAB — WET PREP FOR TRICH, YEAST, CLUE
Clue Cells Wet Prep HPF POC: NONE SEEN
Trich, Wet Prep: NONE SEEN
WBC, Wet Prep HPF POC: NONE SEEN

## 2014-09-13 MED ORDER — FLUCONAZOLE 150 MG PO TABS
150.0000 mg | ORAL_TABLET | Freq: Once | ORAL | Status: DC
Start: 2014-09-13 — End: 2015-04-24

## 2014-09-13 MED ORDER — ALENDRONATE SODIUM 70 MG PO TABS: 70.0000 mg | ORAL_TABLET | ORAL | Status: DC

## 2014-09-13 NOTE — Patient Instructions (Signed)
Take the one fluconazole (Diflucan) pill for the yeast infection.  Start on the alendronate (Fosamax) pill for the osteoporosis. You will take one pill every 7 days. You must take it on an empty stomach and not eat or drink for one hour afterwards and not lie down.   Alendronate tablets What is this medicine? ALENDRONATE (a LEN droe nate) slows calcium loss from bones. It helps to make normal healthy bone and to slow bone loss in people with Paget's disease and osteoporosis. It may be used in others at risk for bone loss. This medicine may be used for other purposes; ask your health care provider or pharmacist if you have questions. COMMON BRAND NAME(S): Fosamax What should I tell my health care provider before I take this medicine? They need to know if you have any of these conditions: -dental disease -esophagus, stomach, or intestine problems, like acid reflux or GERD -kidney disease -low blood calcium -low vitamin D -problems sitting or standing 30 minutes -trouble swallowing -an unusual or allergic reaction to alendronate, other medicines, foods, dyes, or preservatives -pregnant or trying to get pregnant -breast-feeding How should I use this medicine? You must take this medicine exactly as directed or you will lower the amount of the medicine you absorb into your body or you may cause yourself harm. Take this medicine by mouth first thing in the morning, after you are up for the day. Do not eat or drink anything before you take your medicine. Swallow the tablet with a full glass (6 to 8 fluid ounces) of plain water. Do not take this medicine with any other drink. Do not chew or crush the tablet. After taking this medicine, do not eat breakfast, drink, or take any medicines or vitamins for at least 30 minutes. Sit or stand up for at least 30 minutes after you take this medicine; do not lie down. Do not take your medicine more often than directed. Talk to your pediatrician regarding the use  of this medicine in children. Special care may be needed. Overdosage: If you think you have taken too much of this medicine contact a poison control center or emergency room at once. NOTE: This medicine is only for you. Do not share this medicine with others. What if I miss a dose? If you miss a dose, do not take it later in the day. Continue your normal schedule starting the next morning. Do not take double or extra doses. What may interact with this medicine? -aluminum hydroxide -antacids -aspirin -calcium supplements -drugs for inflammation like ibuprofen, naproxen, and others -iron supplements -magnesium supplements -vitamins with minerals This list may not describe all possible interactions. Give your health care provider a list of all the medicines, herbs, non-prescription drugs, or dietary supplements you use. Also tell them if you smoke, drink alcohol, or use illegal drugs. Some items may interact with your medicine. What should I watch for while using this medicine? Visit your doctor or health care professional for regular checks ups. It may be some time before you see benefit from this medicine. Do not stop taking your medicine except on your doctor's advice. Your doctor or health care professional may order blood tests and other tests to see how you are doing. You should make sure you get enough calcium and vitamin D while you are taking this medicine, unless your doctor tells you not to. Discuss the foods you eat and the vitamins you take with your health care professional. Some people who take this medicine  have severe bone, joint, and/or muscle pain. This medicine may also increase your risk for a broken thigh bone. Tell your doctor right away if you have pain in your upper leg or groin. Tell your doctor if you have any pain that does not go away or that gets worse. This medicine can make you more sensitive to the sun. If you get a rash while taking this medicine, sunlight may cause  the rash to get worse. Keep out of the sun. If you cannot avoid being in the sun, wear protective clothing and use sunscreen. Do not use sun lamps or tanning beds/booths. What side effects may I notice from receiving this medicine? Side effects that you should report to your doctor or health care professional as soon as possible: -allergic reactions like skin rash, itching or hives, swelling of the face, lips, or tongue -black or tarry stools -bone, muscle or joint pain -changes in vision -chest pain -heartburn or stomach pain -jaw pain, especially after dental work -pain or trouble when swallowing -redness, blistering, peeling or loosening of the skin, including inside the mouth Side effects that usually do not require medical attention (report to your doctor or health care professional if they continue or are bothersome): -changes in taste -diarrhea or constipation -eye pain or itching -headache -nausea or vomiting -stomach gas or fullness This list may not describe all possible side effects. Call your doctor for medical advice about side effects. You may report side effects to FDA at 1-800-FDA-1088. Where should I keep my medicine? Keep out of the reach of children. Store at room temperature of 15 and 30 degrees C (59 and 86 degrees F). Throw away any unused medicine after the expiration date. NOTE: This sheet is a summary. It may not cover all possible information. If you have questions about this medicine, talk to your doctor, pharmacist, or health care provider.  2015, [employer]vier/[employer] Standard. (2010-11-06 08:56:09)

## 2014-09-13 NOTE — Progress Notes (Signed)
[name] [name] 02-29-1930 [id number]        81 y.o.  G2P2 for breast and pelvic exam. Several issues noted below.  Past medical history,surgical history, problem list, medications, allergies, family history and social history were all reviewed and documented as reviewed in the EPIC chart.  ROS:  Performed with pertinent positives and negatives included in the history, assessment and plan.   Additional significant findings :  none   Exam: Kim Counsellor Vitals:   09/13/14 1101  BP: 120/74  Height: 5\' 5"  (1.651 m)  Weight: 160 lb (72.576 kg)   General appearance:  Normal affect, orientation and appearance. Skin: Grossly normal HEENT: Without gross lesions.  No cervical or supraclavicular adenopathy. Thyroid normal.  Lungs:  Clear without wheezing, rales or rhonchi Cardiac: RR, without RMG Abdominal:  Soft, nontender, without masses, guarding, rebound, organomegaly or hernia Breasts:  Examined lying and sitting without masses, retractions, discharge or axillary adenopathy.  Well-healed right tail of Spence scar. Well-healed reduction scars. Pelvic:  Ext/BUS/vagina with generalized atrophic changes. Thick white discharge noted.  Adnexa  Without masses or tenderness    Anus and perineum  Normal   Rectovaginal  Normal sphincter tone without palpated masses or tenderness.    Assessment/Plan:  81 y.o. G2P2 female for breast and pelvic exam..   1. Osteoporosis.  DEXA 06/2011 T score -2.7. Patient was to start on Actonel per our previous discussion but for some reason never did. I again discussed her osteoporosis my recommendation to consider treatment. Will go ahead and try Fosamax 70 mg weekly.  Side effects and risks reviewed. How to take the medication was discussed in detail. Risks of GERD, osteonecrosis of the jaw on atypical fractures were discussed. Prescription 1 year provided and patient will initiate and call me if she has any issues. 2. Vaginal irritation. Recently treated for BV  with metronidazole by her primary physician. Exam and wet prep consistent with yeast. Diflucan 150 mg dose 1 provided. Follow up if symptoms persist, worsen or recur. 3. Postmenopausal/atrophic genital changes. Without significant hot flushes, night sweats or vaginal dryness. 4. Pap smear years ago. No Pap smear done today.  No history of abnormal Pap smears previously.  Status post TAH in the past for benign indications. We both agreed to stop screening given her age and history. 5. Mammography 2014. Patient has scheduled for next week and will follow up for this. SBE monthly reviewed. 6. Colonoscopy approximately 10 years ago. Will follow up with primary physician as to the recommendation as far as to continue to screen or not given her age. 7. Health maintenance.  No routine lab work done as patient reports is done at her primary physician's office.     Anastasio Auerbach MD, 11:39 AM 09/13/2014

## 2014-09-20 ENCOUNTER — Ambulatory Visit
Admission: RE | Admit: 2014-09-20 | Discharge: 2014-09-20 | Disposition: A | Discharge status: Home / Self Care | Payer: Medicare Other | Source: Ambulatory Visit

## 2014-09-20 DIAGNOSIS — Z1231 Encounter for screening mammogram for malignant neoplasm of breast: Secondary | ICD-10-CM | POA: Diagnosis not present

## 2014-09-23 NOTE — ED Notes (Signed)
Patient called w referral problems. This Probation officer unable to intervene due to restrictions from her insurance. Advised to call the 800 # on her insurance card to see if they can assist her

## 2014-09-26 ENCOUNTER — Encounter: Payer: Self-pay | Admitting: Internal Medicine

## 2014-09-26 ENCOUNTER — Ambulatory Visit (INDEPENDENT_AMBULATORY_CARE_PROVIDER_SITE_OTHER): Payer: Medicare Other | Admitting: Internal Medicine

## 2014-09-26 VITALS — BP 158/82 | HR 72 | Temp 98.1°F | Resp 16 | Ht 65.0 in | Wt 161.1 lb

## 2014-09-26 DIAGNOSIS — C44601 Unspecified malignant neoplasm of skin of unspecified upper limb, including shoulder: Secondary | ICD-10-CM | POA: Insufficient documentation

## 2014-09-26 DIAGNOSIS — C44609 Unspecified malignant neoplasm of skin of left upper limb, including shoulder: Secondary | ICD-10-CM | POA: Diagnosis not present

## 2014-09-26 HISTORY — DX: Unspecified malignant neoplasm of skin of unspecified upper limb, including shoulder: C44.601

## 2014-09-26 NOTE — Patient Instructions (Addendum)
We are working on getting you in the skin doctor. You do need to call medicaid and switch your doctor so that we can do referrals in the future. Currently [facility] is listed. You need to have your medicaid card doctor as blank since we are not a [place] access provider.

## 2014-09-26 NOTE — Assessment & Plan Note (Signed)
Most likely cancer and will try to do dermatology referral however she does have medicaid as her third insurance with [facility] listed as her pcp. Have spoken to our schedulers and they are working on it but will be difficult.

## 2014-09-26 NOTE — Progress Notes (Signed)
   Subjective:    Patient ID: [name] [name], female    DOB: [birth year], 81 y.o.   MRN: [id number]  HPI The patient is an 81 YO woman who is coming in for skin lesion. She saw urgent care about 2 weeks ago and was told it looked like skin cancer. She tried to call the dermatologist she was told to but they were unable to help her. She did not call our office but did call urgent care back. It is not draining but is growing. Redness around the area. Denies fevers or chills.   Review of Systems  Constitutional: Negative for fever, chills, activity change, appetite change, fatigue and unexpected weight change.  Respiratory: Negative.   Cardiovascular: Negative.   Gastrointestinal: Negative.   Genitourinary: Negative for urgency, frequency, flank pain, vaginal bleeding, vaginal discharge, difficulty urinating, genital sores and pelvic pain.  Skin: Positive for color change, rash and wound.  Neurological: Negative.       Objective:   Physical Exam  Constitutional: She is oriented to person, place, and time. She appears well-developed and well-nourished.  Cardiovascular: Normal rate and regular rhythm.   Pulmonary/Chest: Effort normal and breath sounds normal.  Abdominal: Soft. Bowel sounds are normal. She exhibits no distension.  Neurological: She is alert and oriented to person, place, and time.  Skin: Skin is warm. Rash noted. There is erythema.  3 cm circular lesion on the left forearm which is suspicious for skin cancer with central ulceration and rolled edges with surrounding skin color change (red). Not infected appearing.    Filed Vitals:   09/26/14 1259  BP: 158/82  Pulse: 72  Temp: 98.1 F (36.7 C)  TempSrc: Oral  Resp: 16  Height: 5\' 5"  (1.651 m)  Weight: 161 lb 1.3 oz (73.065 kg)  SpO2: 91%      Assessment & Plan:

## 2014-09-26 NOTE — Progress Notes (Signed)
Pre visit review using our clinic review tool, if applicable. No additional management support is needed unless otherwise documented below in the visit note. 

## 2014-10-01 DIAGNOSIS — D239 Other benign neoplasm of skin, unspecified: Secondary | ICD-10-CM | POA: Diagnosis not present

## 2014-10-01 DIAGNOSIS — C4492 Squamous cell carcinoma of skin, unspecified: Secondary | ICD-10-CM

## 2014-10-01 DIAGNOSIS — C44621 Squamous cell carcinoma of skin of unspecified upper limb, including shoulder: Secondary | ICD-10-CM | POA: Diagnosis not present

## 2014-10-01 DIAGNOSIS — C44629 Squamous cell carcinoma of skin of left upper limb, including shoulder: Secondary | ICD-10-CM | POA: Diagnosis not present

## 2014-10-01 HISTORY — DX: Squamous cell carcinoma of skin, unspecified: C44.92

## 2014-10-30 DIAGNOSIS — C44621 Squamous cell carcinoma of skin of unspecified upper limb, including shoulder: Secondary | ICD-10-CM | POA: Diagnosis not present

## 2014-11-15 ENCOUNTER — Other Ambulatory Visit: Payer: Self-pay | Admitting: Internal Medicine

## 2014-12-20 DIAGNOSIS — L570 Actinic keratosis: Secondary | ICD-10-CM | POA: Diagnosis not present

## 2014-12-20 DIAGNOSIS — L821 Other seborrheic keratosis: Secondary | ICD-10-CM | POA: Diagnosis not present

## 2014-12-20 DIAGNOSIS — Z85828 Personal history of other malignant neoplasm of skin: Secondary | ICD-10-CM | POA: Diagnosis not present

## 2014-12-20 DIAGNOSIS — D1801 Hemangioma of skin and subcutaneous tissue: Secondary | ICD-10-CM | POA: Diagnosis not present

## 2015-01-15 ENCOUNTER — Ambulatory Visit (INDEPENDENT_AMBULATORY_CARE_PROVIDER_SITE_OTHER): Payer: Medicare Other | Admitting: Internal Medicine

## 2015-01-15 ENCOUNTER — Encounter: Payer: Self-pay | Admitting: Internal Medicine

## 2015-01-15 VITALS — BP 122/72 | HR 71 | Temp 97.8°F | Ht 66.0 in | Wt 163.0 lb

## 2015-01-15 DIAGNOSIS — J309 Allergic rhinitis, unspecified: Secondary | ICD-10-CM | POA: Diagnosis not present

## 2015-01-15 DIAGNOSIS — I10 Essential (primary) hypertension: Secondary | ICD-10-CM

## 2015-01-15 DIAGNOSIS — H6981 Other specified disorders of Eustachian tube, right ear: Secondary | ICD-10-CM | POA: Diagnosis not present

## 2015-01-15 DIAGNOSIS — J018 Other acute sinusitis: Secondary | ICD-10-CM | POA: Diagnosis not present

## 2015-01-15 DIAGNOSIS — H6980 Other specified disorders of Eustachian tube, unspecified ear: Secondary | ICD-10-CM | POA: Insufficient documentation

## 2015-01-15 MED ORDER — FEXOFENADINE HCL 180 MG PO TABS: 180.0000 mg | ORAL_TABLET | Freq: Every day | ORAL | Status: DC

## 2015-01-15 MED ORDER — AZITHROMYCIN 250 MG PO TABS: ORAL_TABLET | ORAL | Status: DC

## 2015-01-15 NOTE — Progress Notes (Signed)
Subjective:    Patient ID: [name] [name], female    DOB: 11/30/1929, 81 y.o.   MRN: [id number]  HPI   Here with 2-3 days acute onset fever, facial pain, pressure, headache, general weakness and malaise, and greenish d/c, with mild ST and cough, but pt denies chest pain, wheezing, increased sob or doe, orthopnea, PND, increased LE swelling, palpitations, dizziness or syncope. Does have several wks ongoing nasal allergy symptoms with clearish congestion, itch and sneezing, without fever, pain, ST, cough, swelling or wheezing.  Has right ear eustachian valve dysfxn type clogged up feeling, has seen ENT twice in the past 1-2 yrs, given what sounds like a steroid nasal spray, but she just doesn't like taking it.  Prior imaging with MRI has mentioned right sinus chronic involvement Past Medical History  Diagnosis Date  . Anemia     iron def  . Anxiety   . History of colonic polyps     cecal  . Osteoporosis 07/21/2011    t score -2.7  . Hypertension   . Arrhythmia     paroxysmal SVT and symtomatic PAC's  . Stricture esophagus   . Diverticulosis     colon  . Vitamin D deficiency     2010   Past Surgical History  Procedure Laterality Date  . A&p, enterocele repair      s/p 2003  . Hemorrhoid surgery    . Breast reduction surgery      s/p 1996  . Abdominal hysterectomy      reports that she quit smoking about 46 years ago. She has never used smokeless tobacco. She reports that she does not drink alcohol or use illicit drugs. family history includes Breast cancer (age of onset: 33) in her other; Breast cancer (age of onset: 13) in her daughter; Cancer (age of onset: 59) in her other; Cancer (age of onset: 65) in her daughter; Cancer (age of onset: 97) in her sister; Hypertension in her father and sister; Kidney disease in her brother; Stroke in her father and mother. Allergies  Allergen Reactions  . Zyrtec [Cetirizine]     Palpitations   Current Outpatient Prescriptions on File Prior to  Visit  Medication Sig Dispense Refill  . aspirin 81 MG tablet Take 81 mg by mouth daily.    . Cholecalciferol (VITAMIN D3) 1000 UNITS CAPS Take 1,000 Units by mouth daily.     . flecainide (TAMBOCOR) 100 MG tablet TAKE 1 TABLET EVERY MORNING AND TAKE ONE-HALF (1/2) TABLET EVERY EVENING 135 tablet 1  . LORazepam (ATIVAN) 1 MG tablet Take 0.5 tablets (0.5 mg total) by mouth daily as needed for anxiety. 30 tablet 0  . alendronate (FOSAMAX) 70 MG tablet Take 1 tablet (70 mg total) by mouth every 7 (seven) days. Take with a full glass of water on an empty stomach. (Patient not taking: Reported on 09/26/2014) 12 tablet 4  . fluconazole (DIFLUCAN) 150 MG tablet Take 1 tablet (150 mg total) by mouth once. (Patient not taking: Reported on 09/26/2014) 1 tablet 0   No current facility-administered medications on file prior to visit.   Review of Systems  Constitutional: Negative for unusual diaphoresis or night sweats HENT: Negative for ringing in ear or discharge Eyes: Negative for double vision or worsening visual disturbance.  Respiratory: Negative for choking and stridor.   Gastrointestinal: Negative for vomiting or other signifcant bowel change Genitourinary: Negative for hematuria or change in urine volume.  Musculoskeletal: Negative for other MSK pain or swelling Skin:  Negative for color change and worsening wound.  Neurological: Negative for tremors and numbness other than noted  Psychiatric/Behavioral: Negative for decreased concentration or agitation other than above       Objective:   Physical Exam BP 122/72 mmHg  Pulse 71  Temp(Src) 97.8 F (36.6 C) (Oral)  Ht 5\' 6"  (1.676 m)  Wt 163 lb (73.936 kg)  BMI 26.32 kg/m2  SpO2 96%  LMP 05/24/1970 VS noted, mild ill Constitutional: Pt appears in no significant distress HENT: Head: NCAT.  Right Ear: External ear normal.  Left Ear: External ear normal.  Eyes: . Pupils are equal, round, and reactive to light. Conjunctivae and EOM are  normal Bilat tm's with mild erythema.  Max sinus areas mild tender right > left.  Pharynx with mild erythema, no exudate Neck: Normal range of motion. Neck supple. with small bilat acute lymphadnopathy Cardiovascular: Normal rate and regular rhythm.   Pulmonary/Chest: Effort normal and breath sounds without rales or wheezing.  Neurological: Pt is alert. Not confused , motor grossly intact Skin: Skin is warm. No rash, no LE edema Psychiatric: Pt behavior is normal. No agitation.      Assessment & Plan:

## 2015-01-15 NOTE — Progress Notes (Signed)
Pre visit review using our clinic review tool, if applicable. No additional management support is needed unless otherwise documented below in the visit note. 

## 2015-01-15 NOTE — Assessment & Plan Note (Signed)
Mild to mod, for antibx course,  to f/u any worsening symptoms or concerns 

## 2015-01-15 NOTE — Patient Instructions (Signed)
Please take all new medication as prescribed - the antibiotic, and allegra for allergies  You can also take Mucinex (or it's generic off brand) for congestion, and tylenol as needed for pain.  Please continue all other medications as before, and refills have been done if requested.  Please have the pharmacy call with any other refills you may need.  Please continue your efforts at being more active, low cholesterol diet, and weight control.  Please keep your appointments with your specialists as you may have planned

## 2015-01-15 NOTE — Assessment & Plan Note (Signed)
Ok for allegra qd prn,  to f/u any worsening symptoms or concerns

## 2015-01-15 NOTE — Assessment & Plan Note (Signed)
[place] for [employer] prn, d/w pt the etiology, natural history, and ok to take mucinex with all other meds, declines nasal steroid

## 2015-01-15 NOTE — Assessment & Plan Note (Signed)
stable overall by history and exam, recent data reviewed with pt, and pt to continue medical treatment as before,  to f/u any worsening symptoms or concerns BP Readings from Last 3 Encounters:  01/15/15 122/72  09/26/14 158/82  09/13/14 120/74

## 2015-02-03 ENCOUNTER — Encounter: Payer: Self-pay | Admitting: Internal Medicine

## 2015-02-03 ENCOUNTER — Ambulatory Visit (INDEPENDENT_AMBULATORY_CARE_PROVIDER_SITE_OTHER): Payer: Medicare Other | Admitting: Internal Medicine

## 2015-02-03 VITALS — BP 148/92 | HR 83 | Temp 97.9°F | Resp 16 | Ht 65.5 in | Wt 163.1 lb

## 2015-02-03 DIAGNOSIS — D179 Benign lipomatous neoplasm, unspecified: Secondary | ICD-10-CM

## 2015-02-03 NOTE — Patient Instructions (Signed)
The spot on your arm is a lipoma and is not worrisome. If it grows or bothers you we can send you to a surgeon to have it removed.

## 2015-02-03 NOTE — Progress Notes (Signed)
Pre visit review using our clinic review tool, if applicable. No additional management support is needed unless otherwise documented below in the visit note. 

## 2015-02-04 DIAGNOSIS — D179 Benign lipomatous neoplasm, unspecified: Secondary | ICD-10-CM | POA: Insufficient documentation

## 2015-02-04 NOTE — Assessment & Plan Note (Signed)
Talked to her about the benign nature of her lipomas. As they are not large and not bothering her at this time have recommended continued surveillance. If she desires she can have surgically removed. She declines at this time.

## 2015-02-04 NOTE — Progress Notes (Signed)
   Subjective:    Patient ID: [name] [name], female    DOB: June 09, 1929, 81 y.o.   MRN: [id number]  HPI The patient is an 81 YO female coming in for lumps in her elbow. They are soft tissue and have been present for many years. They may have grown slightly but not much. Never had them looked at before. Not painful and do not bother her. No weight change, fevers, chills.   Review of Systems  Constitutional: Negative for fever, activity change, appetite change, fatigue and unexpected weight change.  Respiratory: Negative.   Cardiovascular: Negative.   Gastrointestinal: Negative.   Musculoskeletal: Negative.   Skin: Negative.        Fatty lumps      Objective:   Physical Exam  Constitutional: She appears well-developed and well-nourished.  HENT:  Head: Normocephalic and atraumatic.  Eyes: EOM are normal.  Neck: Normal range of motion.  Cardiovascular: Normal rate and regular rhythm.   Pulmonary/Chest: Effort normal and breath sounds normal.  Abdominal: Soft. Bowel sounds are normal. She exhibits no distension. There is no tenderness. There is no rebound.  Skin: Skin is warm and dry.  Soft lipomas in the crease of the left elbow, 2 both 1 in diameter   Filed Vitals:   02/03/15 1456  BP: 148/92  Pulse: 83  Temp: 97.9 F (36.6 C)  TempSrc: Oral  Resp: 16  Height: 5' 5.5" (1.664 m)  Weight: 163 lb 1.9 oz (73.991 kg)  SpO2: 96%      Assessment & Plan:

## 2015-03-11 DIAGNOSIS — Z23 Encounter for immunization: Secondary | ICD-10-CM | POA: Diagnosis not present

## 2015-04-24 ENCOUNTER — Ambulatory Visit (INDEPENDENT_AMBULATORY_CARE_PROVIDER_SITE_OTHER): Payer: Medicare Other | Admitting: Internal Medicine

## 2015-04-24 ENCOUNTER — Encounter: Payer: Self-pay | Admitting: Internal Medicine

## 2015-04-24 VITALS — BP 134/84 | HR 70 | Ht 65.5 in | Wt 167.4 lb

## 2015-04-24 DIAGNOSIS — R002 Palpitations: Secondary | ICD-10-CM

## 2015-04-24 DIAGNOSIS — I491 Atrial premature depolarization: Secondary | ICD-10-CM

## 2015-04-24 DIAGNOSIS — I10 Essential (primary) hypertension: Secondary | ICD-10-CM

## 2015-04-24 MED ORDER — FLECAINIDE ACETATE 100 MG PO TABS: ORAL_TABLET | ORAL | Status: DC

## 2015-04-24 NOTE — Assessment & Plan Note (Signed)
She is maintaining NSR and appears asymptomatic on her flecainide therapy. She will undergo watchful waiting.

## 2015-04-24 NOTE — Progress Notes (Signed)
HPI [name] [name] returns today for followup. She is a very pleasant 81 year old woman with a history of palpitations who has been on flecainide therapy for many years. No syncope. The patient has mild anxiety. She denies chest pain or shortness of breath. She remains a vegetarian. She has had no recurrent palpitations over the past several months. She admits to some dietary indiscretion and has gained 5 lbs since her last visit.   Current Outpatient Prescriptions  Medication Sig Dispense Refill  . aspirin 81 MG tablet Take 81 mg by mouth daily.    . Cholecalciferol (VITAMIN D3) 1000 UNITS CAPS Take 1,000 Units by mouth daily.     . fexofenadine (ALLEGRA) 180 MG tablet Take 1 tablet (180 mg total) by mouth daily. As needed for allergies and chronic sinus 30 tablet 11  . flecainide (TAMBOCOR) 100 MG tablet Take 1 tablet by mouth in the morning and take 1/2 tablet in the evening 135 tablet 3  . LORazepam (ATIVAN) 1 MG tablet Take 0.5 tablets (0.5 mg total) by mouth daily as needed for anxiety. 30 tablet 0   No current facility-administered medications for this visit.     Past Medical History  Diagnosis Date  . Anemia     iron def  . Anxiety   . History of colonic polyps     cecal  . Osteoporosis 07/21/2011    t score -2.7  . Hypertension   . Arrhythmia     paroxysmal SVT and symtomatic PAC's  . Stricture esophagus   . Diverticulosis     colon  . Vitamin D deficiency     2010    ROS:   All systems reviewed and negative except as noted in the HPI.   Past Surgical History  Procedure Laterality Date  . A&p, enterocele repair      s/p 2003  . Hemorrhoid surgery    . Breast reduction surgery      s/p 1996  . Abdominal hysterectomy       Family History  Problem Relation Age of Onset  . Stroke Father     @ 42  . Hypertension Father   . Hypertension Sister   . Cancer Sister 59    brain  . Stroke Mother     medication induced  . Breast cancer Other 42    breast  .  Breast cancer Daughter 69  . Cancer Daughter 48    breast  . Cancer Other 71    colon/MELANOMA  . Kidney disease Brother      Social History   Social History  . Marital Status: Married    Spouse Name: N/A  . Number of Children: N/A  . Years of Education: N/A   Occupational History  . Retired    Social History Main Topics  . Smoking status: Former Smoker    Quit date: 05/24/1968  . Smokeless tobacco: Never Used     Comment: only smoked 7 years  . Alcohol Use: No  . Drug Use: No  . Sexual Activity: No     Comment: 1st intercourse 75 yo-1 partner   Other Topics Concern  . Not on file   Social History Narrative     BP 134/84 mmHg  Pulse 70  Ht 5' 5.5" (1.664 m)  Wt 167 lb 6.4 oz (75.932 kg)  BMI 27.42 kg/m2  LMP 05/24/1970  Physical Exam:  Well appearing elderly woman, NAD HEENT: Unremarkable Neck:  6 cm JVD, no thyromegally Back:  No  CVA tenderness Lungs:  Clear with no wheezes, rales, or rhonchi. HEART:  Regular rate rhythm, no murmurs, no rubs, no clicks Abd:  soft, positive bowel sounds, no organomegally, no rebound, no guarding Ext:  2 plus pulses, no edema, no cyanosis, no clubbing Skin:  No rashes no nodules Neuro:  CN II through XII intact, motor grossly intact  EKG Normal sinus rhythm, normal axis and intervals.  Assess/Plan:

## 2015-04-24 NOTE — Patient Instructions (Signed)
Medication Instructions: - no changes  Labwork: - none  Procedures/Testing: - none  Follow-Up: - Your physician wants you to follow-up in: 1 year with Dr. Taylor. You will receive a reminder letter in the mail two months in advance. If you don't receive a letter, please call our office to schedule the follow-up appointment.  Any Additional Special Instructions Will Be Listed Below (If Applicable).   

## 2015-04-24 NOTE — Assessment & Plan Note (Signed)
Her blood pressure is well controlled. Will continue her current meds.  

## 2015-09-15 ENCOUNTER — Encounter: Payer: Medicare Other | Admitting: Gynecology

## 2015-09-30 ENCOUNTER — Encounter (HOSPITAL_COMMUNITY): Payer: Self-pay | Admitting: *Deleted

## 2015-09-30 ENCOUNTER — Ambulatory Visit (HOSPITAL_COMMUNITY)
Admission: EM | Admit: 2015-09-30 | Discharge: 2015-09-30 | Disposition: A | Discharge status: Home / Self Care | Payer: Medicare Other | Attending: Family Medicine | Admitting: Family Medicine

## 2015-09-30 ENCOUNTER — Ambulatory Visit (INDEPENDENT_AMBULATORY_CARE_PROVIDER_SITE_OTHER): Payer: Medicare Other

## 2015-09-30 DIAGNOSIS — S46011A Strain of muscle(s) and tendon(s) of the rotator cuff of right shoulder, initial encounter: Secondary | ICD-10-CM

## 2015-09-30 DIAGNOSIS — S4991XA Unspecified injury of right shoulder and upper arm, initial encounter: Secondary | ICD-10-CM | POA: Diagnosis not present

## 2015-09-30 DIAGNOSIS — M79601 Pain in right arm: Secondary | ICD-10-CM | POA: Diagnosis not present

## 2015-09-30 MED ORDER — DICLOFENAC SODIUM 1 % TD GEL: 4.0000 g | Freq: Four times a day (QID) | TRANSDERMAL | Status: DC

## 2015-09-30 NOTE — ED Provider Notes (Signed)
CSN: [id number]     Arrival date & time 09/30/15  1258 History   First MD Initiated Contact with Patient 09/30/15 1314     Chief Complaint  Patient presents with  . Shoulder Pain   (Consider location/radiation/quality/duration/timing/severity/associated sxs/prior Treatment) Patient is a 82 y.o. female presenting with shoulder pain. The history is provided by the patient.  Shoulder Pain Location:  Shoulder Time since incident:  3 weeks Injury: no   Shoulder location:  R shoulder Pain details:    Quality:  Burning   Radiates to:  Does not radiate   Severity:  Moderate   Onset quality:  Sudden   Progression:  Worsening Chronicity:  New Dislocation: no   Foreign body present:  No foreign bodies Prior injury to area:  No Worsened by:  Movement Associated symptoms: decreased range of motion and stiffness   Associated symptoms: no back pain, no fever and no numbness     Past Medical History  Diagnosis Date  . Anemia     iron def  . Anxiety   . History of colonic polyps     cecal  . Osteoporosis 07/21/2011    t score -2.7  . Hypertension   . Arrhythmia     paroxysmal SVT and symtomatic PAC's  . Stricture esophagus   . Diverticulosis     colon  . Vitamin D deficiency     2010   Past Surgical History  Procedure Laterality Date  . A&p, enterocele repair      s/p 2003  . Hemorrhoid surgery    . Breast reduction surgery      s/p 1996  . Abdominal hysterectomy     Family History  Problem Relation Age of Onset  . Stroke Father     @ 77  . Hypertension Father   . Hypertension Sister   . Cancer Sister 57    brain  . Stroke Mother     medication induced  . Breast cancer Other 42    breast  . Breast cancer Daughter 50  . Cancer Daughter 3    breast  . Cancer Other 61    colon/MELANOMA  . Kidney disease Brother    Social History  Substance Use Topics  . Smoking status: Former Smoker    Quit date: 05/24/1968  . Smokeless tobacco: Never Used     Comment: only  smoked 7 years  . Alcohol Use: No   OB History    Gravida Para Term Preterm AB TAB SAB Ectopic Multiple Living   2 2        1      Review of Systems  Constitutional: Negative.  Negative for fever.  Musculoskeletal: Positive for stiffness. Negative for back pain, joint swelling and gait problem.  Skin: Negative.   All other systems reviewed and are negative.   Allergies  Zyrtec  Home Medications   Prior to Admission medications   Medication Sig Start Date End Date Taking? Authorizing Provider  aspirin 81 MG tablet Take 81 mg by mouth daily.    Historical Provider, MD  Cholecalciferol (VITAMIN D3) 1000 UNITS CAPS Take 1,000 Units by mouth daily.     Historical Provider, MD  diclofenac sodium (VOLTAREN) 1 % GEL Apply 4 g topically 4 (four) times daily. Please instruct in dosing 09/30/15   Billy Fischer, MD  fexofenadine (ALLEGRA) 180 MG tablet Take 1 tablet (180 mg total) by mouth daily. As needed for allergies and chronic sinus 01/15/15   Hunt Oris  Jenny Reichmann, MD  flecainide (TAMBOCOR) 100 MG tablet Take 1 tablet by mouth in the morning and take 1/2 tablet in the evening 04/24/15   Evans Lance, MD  LORazepam (ATIVAN) 1 MG tablet Take 0.5 tablets (0.5 mg total) by mouth daily as needed for anxiety. 08/07/14   Kennyth Arnold, FNP   Meds Ordered and Administered this Visit  Medications - No data to display  BP 174/95 mmHg  Pulse 76  Temp(Src) 98 F (36.7 C) (Oral)  Resp 16  SpO2 95%  LMP 05/24/1970 No data found.   Physical Exam  Constitutional: She is oriented to person, place, and time. She appears well-developed and well-nourished. No distress.  Musculoskeletal: She exhibits tenderness.       Right shoulder: She exhibits decreased range of motion, tenderness, bony tenderness, pain and decreased strength. She exhibits no swelling, no effusion, no deformity and normal pulse.       Arms: Neurological: She is alert and oriented to person, place, and time.  Skin: Skin is warm and dry.    Nursing note and vitals reviewed.   ED Course  Procedures (including critical care time)  Labs Review Labs Reviewed - No data to display  Imaging Review Dg Humerus Right  09/30/2015  CLINICAL DATA:  Pain in the right upper arm after injury 3 weeks ago. Initial encounter. EXAM: RIGHT HUMERUS - 2+ VIEW COMPARISON:  None. FINDINGS: No acute fracture or subluxation. Degenerative lateral elbow narrowing. Osteopenia. IMPRESSION: Negative. Electronically Signed   By: Monte Fantasia M.D.   On: 09/30/2015 14:10     Visual Acuity Review  Right Eye Distance:   Left Eye Distance:   Bilateral Distance:    Right Eye Near:   Left Eye Near:    Bilateral Near:         MDM   1. Rotator cuff (capsule) sprain and strain, right, initial encounter    Meds ordered this encounter  Medications  . diclofenac sodium (VOLTAREN) 1 % GEL    Sig: Apply 4 g topically 4 (four) times daily. Please instruct in dosing    Dispense:  100 g    Refill:  [place], MD 09/30/15 [phone]

## 2015-09-30 NOTE — ED Notes (Signed)
pty  Reports     1  Week  Ago she  [address]     And injured  Her  r  Shoulder  -  She has  Pain on  rom         She  Reports  Pain  Is  Not  releived  By otc  meds

## 2015-12-12 ENCOUNTER — Encounter (HOSPITAL_COMMUNITY): Payer: Self-pay | Admitting: *Deleted

## 2015-12-12 ENCOUNTER — Ambulatory Visit (HOSPITAL_COMMUNITY)
Admission: EM | Admit: 2015-12-12 | Discharge: 2015-12-12 | Disposition: A | Discharge status: Home / Self Care | Payer: Medicare Other | Attending: Family Medicine | Admitting: Family Medicine

## 2015-12-12 DIAGNOSIS — I83811 Varicose veins of right lower extremities with pain: Secondary | ICD-10-CM

## 2015-12-12 NOTE — ED Notes (Signed)
6  Inch  Ace  applied

## 2015-12-12 NOTE — Discharge Instructions (Signed)
Use heat and aspirin 81mg  daily and see dr early for further care.

## 2015-12-12 NOTE — ED Provider Notes (Signed)
CSN: [id number]     Arrival date & time 12/12/15  1250 History   First MD Initiated Contact with Patient 12/12/15 1306     Chief Complaint  Patient presents with  . Leg Swelling   (Consider location/radiation/quality/duration/timing/severity/associated sxs/prior Treatment) Patient is a 82 y.o. female presenting with leg pain. The history is provided by the patient.  Leg Pain Location:  Knee Time since incident:  1 week Injury: no   Knee location:  R knee Pain details:    Quality:  Dull   Onset quality:  Gradual   Progression:  Worsening Chronicity:  Chronic (h/o varicose veins with surg.) Prior injury to area:  Yes Relieved by:  None tried Worsened by:  Nothing tried   Past Medical History  Diagnosis Date  . Anemia     iron def  . Anxiety   . History of colonic polyps     cecal  . Osteoporosis 07/21/2011    t score -2.7  . Hypertension   . Arrhythmia     paroxysmal SVT and symtomatic PAC's  . Stricture esophagus   . Diverticulosis     colon  . Vitamin D deficiency     2010   Past Surgical History  Procedure Laterality Date  . A&p, enterocele repair      s/p 2003  . Hemorrhoid surgery    . Breast reduction surgery      s/p 1996  . Abdominal hysterectomy     Family History  Problem Relation Age of Onset  . Stroke Father     @ 89  . Hypertension Father   . Hypertension Sister   . Cancer Sister 38    brain  . Stroke Mother     medication induced  . Breast cancer Other 42    breast  . Breast cancer Daughter 83  . Cancer Daughter 68    breast  . Cancer Other 3    colon/MELANOMA  . Kidney disease Brother    Social History  Substance Use Topics  . Smoking status: Former Smoker    Quit date: 05/24/1968  . Smokeless tobacco: Never Used     Comment: only smoked 7 years  . Alcohol Use: No   OB History    Gravida Para Term Preterm AB TAB SAB Ectopic Multiple Living   2 2        1      Review of Systems  Constitutional: Negative.    Musculoskeletal: Negative for joint swelling and gait problem.  Skin: Positive for color change.  All other systems reviewed and are negative.   Allergies  Zyrtec  Home Medications   Prior to Admission medications   Medication Sig Start Date End Date Taking? Authorizing Provider  aspirin 81 MG tablet Take 81 mg by mouth daily.    Historical Provider, MD  Cholecalciferol (VITAMIN D3) 1000 UNITS CAPS Take 1,000 Units by mouth daily.     Historical Provider, MD  diclofenac sodium (VOLTAREN) 1 % GEL Apply 4 g topically 4 (four) times daily. Please instruct in dosing 09/30/15   Billy Fischer, MD  fexofenadine (ALLEGRA) 180 MG tablet Take 1 tablet (180 mg total) by mouth daily. As needed for allergies and chronic sinus 01/15/15   Biagio Borg, MD  flecainide [facility]) 100 MG tablet Take 1 tablet by mouth in the morning and take 1/2 tablet in the evening 04/24/15   Evans Lance, MD  LORazepam (ATIVAN) 1 MG tablet Take 0.5 tablets (0.5 mg  total) by mouth daily as needed for anxiety. 08/07/14   Kennyth Arnold, FNP   Meds Ordered and Administered this Visit  Medications - No data to display  BP 160/75 mmHg  Pulse 84  Temp(Src) 98.3 F (36.8 C) (Oral)  Resp 16  SpO2 96%  LMP 05/24/1970 No data found.   Physical Exam  Constitutional: She is oriented to person, place, and time. She appears well-developed and well-nourished.  Musculoskeletal: She exhibits tenderness.  Tender varicose vein sts behind right knee in popliteal space, no erythema or dvt at present.  Neurological: She is alert and oriented to person, place, and time.  Skin: Skin is warm and dry.  Nursing note and vitals reviewed.   ED Course  Procedures (including critical care time)  Labs Review Labs Reviewed - No data to display  Imaging Review No results found.   Visual Acuity Review  Right Eye Distance:   Left Eye Distance:   Bilateral Distance:    Right Eye Near:   Left Eye Near:    Bilateral Near:          MDM   1. Varicose veins of leg with pain, right        Billy Fischer, MD 12/12/15 [phone]

## 2015-12-12 NOTE — ED Notes (Signed)
PT   REPORTS  SYMPTOMS  OF  A   SWOLLEN  AREA  BEHIND  R  KNEE        DENYS  ANY  INJURY  APPEARS  TO BE  A  VARICOSE  VEIN          PT  DENYS  ANY  CHEST  PAIN  OR  AN  SHORTNESS  OF  BREATH

## 2015-12-15 ENCOUNTER — Telehealth: Payer: Self-pay | Admitting: Internal Medicine

## 2015-12-15 NOTE — Telephone Encounter (Signed)
New message   Patient c/o Palpitations:  High priority if patient c/o lightheadedness and shortness of breath.  1. How long have you been having palpitations? Fast and slow palpatations. It started over the past wknd.  2. Are you currently experiencing lightheadedness and shortness of breath? No  3. Have you checked your BP and heart rate? (document readings) 160/75 in urgent care on 12/12/15  4. Are you experiencing any other symptoms? Just tired

## 2015-12-15 NOTE — Telephone Encounter (Signed)
Patient complaining of palpitations, fast heart rate, and SOB when taking Flecainide. Patient stated she is fine right now, but just tired. Patient stated palpitations and fast heart rate is on occasion.  Patient has an appointment tomorrow with Dr. Lovena Le. Informed patient if symptoms get worse to go to ED. Patient verbalized understanding.

## 2015-12-16 ENCOUNTER — Ambulatory Visit (INDEPENDENT_AMBULATORY_CARE_PROVIDER_SITE_OTHER): Payer: Medicare Other | Admitting: Internal Medicine

## 2015-12-16 VITALS — BP 160/86 | HR 77 | Ht 65.0 in | Wt 166.2 lb

## 2015-12-16 DIAGNOSIS — R002 Palpitations: Secondary | ICD-10-CM

## 2015-12-16 DIAGNOSIS — R0600 Dyspnea, unspecified: Secondary | ICD-10-CM

## 2015-12-16 LAB — CBC WITH DIFFERENTIAL/PLATELET
Basophils Absolute: 50 cells/uL (ref 0–200)
Basophils Relative: 1 %
Eosinophils Absolute: 50 cells/uL (ref 15–500)
Eosinophils Relative: 1 %
HCT: 41.3 % (ref 35.0–45.0)
Hemoglobin: 13.8 g/dL (ref 11.7–15.5)
Lymphocytes Relative: 33 %
Lymphs Abs: 1650 cells/uL (ref 850–3900)
MCH: 30.6 pg (ref 27.0–33.0)
MCHC: 33.4 g/dL (ref 32.0–36.0)
MCV: 91.6 fL (ref 80.0–100.0)
MPV: 10.5 fL (ref 7.5–12.5)
Monocytes Absolute: 400 cells/uL (ref 200–950)
Monocytes Relative: 8 %
Neutro Abs: 2850 cells/uL (ref 1500–7800)
Neutrophils Relative %: 57 %
Platelets: 335 10*3/uL (ref 140–400)
RBC: 4.51 MIL/uL (ref 3.80–5.10)
RDW: 14.5 % (ref 11.0–15.0)
WBC: 5 10*3/uL (ref 3.8–10.8)

## 2015-12-16 NOTE — Patient Instructions (Signed)
Medication Instructions:  Your physician recommends that you continue on your current medications as directed. Please refer to the Current Medication list given to you today.  Labwork: Today: BMP, CBC w/ diff, D Dimer  Testing/Procedures: Your physician has requested that you have an echocardiogram. Echocardiography is a painless test that uses sound waves to create images of your heart. It provides your doctor with information about the size and shape of your heart and how well your heart's chambers and valves are working. This procedure takes approximately one hour. There are no restrictions for this procedure.  Follow-Up: To be determined once lab work and echocardiogram have been reviewed by the physician.  We will call you with the results.  If you need a refill on your cardiac medications before your next appointment, please call your pharmacy.  Thank you for choosing [facility]!!

## 2015-12-16 NOTE — Progress Notes (Signed)
HPI [name] [name] returns today for followup. She is a very pleasant 82 year old woman with a history of palpitations who has been on flecainide therapy for many years. No syncope. She is here today because she has had sob and atypical chest pain associated with swelling in the right leg. She has varicose veins. She denies fever, chills or night sweats. No productive sputum. She does not have sharpe pain and she feels comfortable most of the time. Some reduced exerecise tolerance. "I just feel weak."   Current Outpatient Prescriptions  Medication Sig Dispense Refill  . aspirin 81 MG tablet Take 81 mg by mouth daily.    . Cholecalciferol (VITAMIN D3) 1000 UNITS CAPS Take 1,000 Units by mouth daily.     . fexofenadine (ALLEGRA) 180 MG tablet Take 1 tablet (180 mg total) by mouth daily. As needed for allergies and chronic sinus 30 tablet 11  . flecainide (TAMBOCOR) 100 MG tablet Take 1 tablet by mouth in the morning and take 1/2 tablet in the evening 135 tablet 3  . LORazepam (ATIVAN) 1 MG tablet Take 0.5 tablets (0.5 mg total) by mouth daily as needed for anxiety. 30 tablet 0   No current facility-administered medications for this visit.      Past Medical History:  Diagnosis Date  . Anemia    iron def  . Anxiety   . Arrhythmia    paroxysmal SVT and symtomatic PAC's  . Diverticulosis    colon  . History of colonic polyps    cecal  . Hypertension   . Osteoporosis 07/21/2011   t score -2.7  . Stricture esophagus   . Vitamin D deficiency    2010    ROS:   All systems reviewed and negative except as noted in the HPI.   Past Surgical History:  Procedure Laterality Date  . A&P, enterocele repair     s/p 2003  . ABDOMINAL HYSTERECTOMY    . BREAST REDUCTION SURGERY     s/p 1996  . HEMORRHOID SURGERY       Family History  Problem Relation Age of Onset  . Stroke Father     @ 56  . Hypertension Father   . Hypertension Sister   . Cancer Sister 69    brain  . Breast cancer  Other 42    breast  . Breast cancer Daughter 18  . Cancer Daughter 32    breast  . Cancer Other 66    colon/MELANOMA  . Stroke Mother     medication induced  . Kidney disease Brother      Social History   Social History  . Marital status: Married    Spouse name: N/A  . Number of children: N/A  . Years of education: N/A   Occupational History  . Retired    Social History Main Topics  . Smoking status: Former Smoker    Quit date: 05/24/1968  . Smokeless tobacco: Never Used     Comment: only smoked 7 years  . Alcohol use No  . Drug use: No  . Sexual activity: No     Comment: 1st intercourse 8 yo-1 partner   Other Topics Concern  . Not on file   Social History Narrative  . No narrative on file     BP (!) 160/86   Pulse 77   Ht 5\' 5"  (1.651 m)   Wt 166 lb 3.2 oz (75.4 kg)   LMP 05/24/1970   BMI 27.66 kg/m   Physical Exam:  Well appearing elderly woman, NAD HEENT: Unremarkable Neck:  6 cm JVD, no thyromegally Back:  No CVA tenderness Lungs:  Clear with no wheezes, rales, or rhonchi. HEART:  Regular rate rhythm, no murmurs, no rubs, no clicks Abd:  soft, positive bowel sounds, no organomegally, no rebound, no guarding Ext:  2 plus pulses, no edema, no cyanosis, no clubbing, right leg with minimal swelling Skin:  No rashes no nodules Neuro:  CN II through XII intact, motor grossly intact  EKG Normal sinus rhythm, normal axis and intervals.  Assess/Plan: 1. Atypical chest pain assoicated with sob, minimally symptomatic. - will obtain a d-dimer. If elevated, will plan to send for a spiral CT. 2. Palpitations - she is minimally symptomatic. She will continue her flecainide. 3. HTN - her blood pressure is not controlled today. Will hold off on medical therapy for now. Would consider adding a beta blocker if not better in the future.   Mikle Bosworth.D.

## 2015-12-17 LAB — BASIC METABOLIC PANEL
BUN: 16 mg/dL (ref 7–25)
CO2: 23 mmol/L (ref 20–31)
Calcium: 10 mg/dL (ref 8.6–10.4)
Chloride: 103 mmol/L (ref 98–110)
Creat: 0.83 mg/dL (ref 0.60–0.88)
Glucose, Bld: 91 mg/dL (ref 65–99)
Potassium: 4.5 mmol/L (ref 3.5–5.3)
Sodium: 137 mmol/L (ref 135–146)

## 2015-12-17 LAB — D-DIMER, QUANTITATIVE (NOT AT ARMC): D-Dimer, Quant: 0.43 mcg/mL FEU (ref <0.50)

## 2015-12-19 ENCOUNTER — Telehealth: Payer: Self-pay | Admitting: Internal Medicine

## 2015-12-19 NOTE — Telephone Encounter (Signed)
New message      The pt was calling to get results of her blood work

## 2015-12-19 NOTE — Telephone Encounter (Signed)
Patient informed of lab

## 2015-12-23 ENCOUNTER — Other Ambulatory Visit: Payer: Self-pay | Admitting: *Deleted

## 2015-12-23 DIAGNOSIS — I83891 Varicose veins of right lower extremities with other complications: Secondary | ICD-10-CM

## 2015-12-24 ENCOUNTER — Ambulatory Visit (HOSPITAL_COMMUNITY)
Admission: RE | Admit: 2015-12-24 | Discharge: 2015-12-24 | Disposition: A | Discharge status: Home / Self Care | Payer: Medicare Other | Source: Ambulatory Visit | Attending: Vascular Surgery | Admitting: Vascular Surgery

## 2015-12-24 DIAGNOSIS — I83891 Varicose veins of right lower extremities with other complications: Secondary | ICD-10-CM | POA: Diagnosis not present

## 2015-12-24 DIAGNOSIS — I10 Essential (primary) hypertension: Secondary | ICD-10-CM | POA: Insufficient documentation

## 2015-12-24 DIAGNOSIS — F419 Anxiety disorder, unspecified: Secondary | ICD-10-CM | POA: Insufficient documentation

## 2015-12-24 DIAGNOSIS — R609 Edema, unspecified: Secondary | ICD-10-CM | POA: Diagnosis present

## 2015-12-25 ENCOUNTER — Encounter: Payer: Self-pay | Admitting: Vascular Surgery

## 2015-12-26 ENCOUNTER — Other Ambulatory Visit: Payer: Self-pay

## 2015-12-26 ENCOUNTER — Ambulatory Visit (HOSPITAL_COMMUNITY): Payer: Medicare Other | Attending: Cardiology

## 2015-12-26 DIAGNOSIS — I358 Other nonrheumatic aortic valve disorders: Secondary | ICD-10-CM | POA: Diagnosis not present

## 2015-12-26 DIAGNOSIS — R0600 Dyspnea, unspecified: Secondary | ICD-10-CM

## 2015-12-26 DIAGNOSIS — I071 Rheumatic tricuspid insufficiency: Secondary | ICD-10-CM | POA: Diagnosis not present

## 2015-12-26 DIAGNOSIS — I119 Hypertensive heart disease without heart failure: Secondary | ICD-10-CM | POA: Diagnosis not present

## 2015-12-26 DIAGNOSIS — I340 Nonrheumatic mitral (valve) insufficiency: Secondary | ICD-10-CM | POA: Insufficient documentation

## 2015-12-31 ENCOUNTER — Ambulatory Visit (INDEPENDENT_AMBULATORY_CARE_PROVIDER_SITE_OTHER): Payer: Medicare Other | Admitting: Vascular Surgery

## 2015-12-31 ENCOUNTER — Encounter: Payer: Self-pay | Admitting: Vascular Surgery

## 2015-12-31 VITALS — BP 154/90 | HR 72 | Temp 97.5°F | Resp 16 | Ht 65.0 in | Wt 165.0 lb

## 2015-12-31 DIAGNOSIS — I872 Venous insufficiency (chronic) (peripheral): Secondary | ICD-10-CM | POA: Diagnosis not present

## 2015-12-31 NOTE — Progress Notes (Signed)
[facility]  Patient name: [name] [name] MRN: [id number] DOB: 1929-12-17 Sex: female  REASON FOR CONSULT: Painful varicose veins right leg. Self-referral.  HPI: [name] [name] is a 82 y.o. female, who has a long history of bilateral lower extremity varicose veins. Over the last 1-2 years these have gradually progressed. She was somewhat concerned about a small cluster of varicose veins in her right popliteal fossa and presents for vascular evaluation.  She denies any history of DVT or phlebitis. She does describe aching pain and heaviness in her legs associate with prolonged standing. She does elevate her legs some and this does help her symptoms. She has not had any bleeding episodes from her varicose veins.  She does tell me that she's previously seen Dr. Sherren Mocha Early in the remote past for excision of varicose veins in her right leg. She does not know if this was done in the hospital or in the office. I cannot find those records in [facility].  Past Medical History:  Diagnosis Date  . Anemia    iron def  . Anxiety   . Arrhythmia    paroxysmal SVT and symtomatic PAC's  . Diverticulosis    colon  . History of colonic polyps    cecal  . Hypertension   . Osteoporosis 07/21/2011   t score -2.7  . Stricture esophagus   . Vitamin D deficiency    2010    Family History  Problem Relation Age of Onset  . Stroke Father     @ 14  . Hypertension Father   . Hypertension Sister   . Cancer Sister 34    brain  . Breast cancer Other 42    breast  . Breast cancer Daughter 20  . Cancer Daughter 42    breast  . Cancer Other 34    colon/MELANOMA  . Stroke Mother     medication induced  . Kidney disease Brother     SOCIAL HISTORY: Social History   Social History  . Marital status: Married    Spouse name: N/A  . Number of children: N/A  . Years of education: N/A   Occupational History  . Retired    Social History Main Topics  . Smoking status: Former  Smoker    Quit date: 05/24/1968  . Smokeless tobacco: Never Used     Comment: only smoked 7 years  . Alcohol use No  . Drug use: No  . Sexual activity: No     Comment: 1st intercourse 89 yo-1 partner   Other Topics Concern  . Not on file   Social History Narrative  . No narrative on file    Allergies  Allergen Reactions  . Zyrtec [Cetirizine]     Palpitations    Current Outpatient Prescriptions  Medication Sig Dispense Refill  . aspirin EC 325 MG tablet Take 325 mg by mouth daily.    . Cholecalciferol (VITAMIN D3) 1000 UNITS CAPS Take 1,000 Units by mouth daily.     . flecainide (TAMBOCOR) 100 MG tablet Take 1 tablet by mouth in the morning and take 1/2 tablet in the evening 135 tablet 3  . aspirin 81 MG tablet Take 81 mg by mouth daily.    . fexofenadine (ALLEGRA) 180 MG tablet Take 1 tablet (180 mg total) by mouth daily. As needed for allergies and chronic sinus (Patient not taking: Reported on 12/31/2015) 30 tablet 11  . LORazepam (ATIVAN) 1 MG tablet Take 0.5 tablets (0.5  mg total) by mouth daily as needed for anxiety. (Patient not taking: Reported on 12/31/2015) 30 tablet 0   No current facility-administered medications for this visit.     REVIEW OF SYSTEMS:  [X]  denotes positive finding, [ ]  denotes negative finding Cardiac  Comments:  Chest pain or chest pressure:    Shortness of breath upon exertion: X   Short of breath when lying flat: X   Irregular heart rhythm: X       Vascular    Pain in calf, thigh, or hip brought on by ambulation:    Pain in feet at night that wakes you up from your sleep:     Blood clot in your veins:    Leg swelling:  X       Pulmonary    Oxygen at home:    Productive cough:     Wheezing:         Neurologic    Sudden weakness in arms or legs:     Sudden numbness in arms or legs:     Sudden onset of difficulty speaking or slurred speech:    Temporary loss of vision in one eye:     Problems with dizziness:         Gastrointestinal     Blood in stool:     Vomited blood:         Genitourinary    Burning when urinating:     Blood in urine:        Psychiatric    Major depression:         Hematologic    Bleeding problems:    Problems with blood clotting too easily:        Skin    Rashes or ulcers:        Constitutional    Fever or chills:      PHYSICAL EXAM: Vitals:   12/31/15 1032 12/31/15 1033  BP: (!) 145/88 (!) 154/90  Pulse: 72   Resp: 16   Temp: 97.5 F (36.4 C)   TempSrc: Oral   SpO2: 98%   Weight: 165 lb (74.8 kg)   Height: 5\' 5"  (1.651 m)     GENERAL: The patient is a well-nourished female, in no acute distress. The vital signs are documented above. CARDIAC: There is a regular rate and rhythm.  VASCULAR: I do not detect carotid bruits. She has palpable femoral and pedal pulses bilaterally. She has mild bilateral lower extremity swelling. PULMONARY: There is good air exchange bilaterally without wheezing or rales. ABDOMEN: Soft and non-tender with normal pitched bowel sounds.  MUSCULOSKELETAL: There are no major deformities or cyanosis. NEUROLOGIC: No focal weakness or paresthesias are detected. SKIN: She has telangiectasias and spider veins bilaterally. PSYCHIATRIC: The patient has a normal affect.  DATA:   LOWER EXTREMITY VENOUS DUPLEX: I have independently interpreted the lower extremity venous duplex scan of the right lower extremity. She has no evidence of DVT or superficial thrombophlebitis on the right. She does have deep vein reflux involving the common femoral vein and femoral vein. In addition, she has reflux in the great saphenous vein and small saphenous vein.  MEDICAL ISSUES:  CHRONIC VENOUS INSUFFICIENCY: This patient has significant chronic venous insufficiency. I've explained that this is a chronic problem. We have discussed the importance of intermittent leg elevation in the proper positioning for this. In addition, I have discussed the importance of wearing compression  stockings when they will be standing or sitting for a long time.  I have encouraged him to stay as active as possible and to avoid prolonged sitting and standing. We have also discussed water aerobics which I think is also very helpful for people with chronic venous insufficiency.  I will be happy to see her back at any time if her venous disease progresses.   Deitra Mayo [facility] [place] [phone]

## 2016-01-09 ENCOUNTER — Telehealth: Payer: Self-pay | Admitting: *Deleted

## 2016-01-09 NOTE — Telephone Encounter (Signed)
-----   Message from Evans Lance, MD sent at 01/04/2016  4:46 PM EDT ----- Normal LV function. No significant valve dysfunction. Mild diastolic dysfunction - might explain her dyspnea. GT

## 2016-01-09 NOTE — Telephone Encounter (Signed)
Discussed with Dr Lovena Le and she can stop her Flecainide.  She is going to decrease to 1/2 tablet twice daily for one week then stop. She will call back if her palpitations return and she will restart.

## 2016-01-09 NOTE — Telephone Encounter (Signed)
Pt aware of echo results, she is asking if she can get off of the flecainide. She does not feel like she needs it. Will forward for dr taylor's review and advise.

## 2016-01-15 DIAGNOSIS — H903 Sensorineural hearing loss, bilateral: Secondary | ICD-10-CM | POA: Diagnosis not present

## 2016-01-15 DIAGNOSIS — H9311 Tinnitus, right ear: Secondary | ICD-10-CM | POA: Insufficient documentation

## 2016-01-15 DIAGNOSIS — H9113 Presbycusis, bilateral: Secondary | ICD-10-CM | POA: Insufficient documentation

## 2016-01-16 ENCOUNTER — Other Ambulatory Visit: Payer: Self-pay

## 2016-01-29 ENCOUNTER — Other Ambulatory Visit: Payer: Self-pay | Admitting: *Deleted

## 2016-01-29 ENCOUNTER — Telehealth: Payer: Self-pay | Admitting: Internal Medicine

## 2016-01-29 DIAGNOSIS — I491 Atrial premature depolarization: Secondary | ICD-10-CM

## 2016-01-29 DIAGNOSIS — R002 Palpitations: Secondary | ICD-10-CM

## 2016-01-29 DIAGNOSIS — R0789 Other chest pain: Secondary | ICD-10-CM

## 2016-01-29 MED ORDER — FLECAINIDE ACETATE 50 MG PO TABS: ORAL_TABLET | ORAL | 1 refills | Status: DC

## 2016-01-29 MED ORDER — FLECAINIDE ACETATE 50 MG PO TABS: ORAL_TABLET | ORAL | 3 refills | Status: DC

## 2016-01-29 NOTE — Telephone Encounter (Signed)
[name] [name] is calling because she has been off of flecainide for about 4 days and she is having upset stomach and her heart is skipping a beat, and this morning she just dont feel well and does not want to go to E/R . She just want to know what can she do . Please call

## 2016-01-29 NOTE — Telephone Encounter (Signed)
SPOKE WITH PT   AND   PT  HAS  JUST RECENTLY  STOPPED  FLECAINIDE STARTING  Sunday  STARTED  FEELING BAD  Wednesday  INDIGESTION AND   HEART  SKIPPING  SO  HAD TO  TAKE   FLECAINIDE  100 MG  LAST  NIGHT   WITH  SOME  IMPROVEMENT  STILL  NOTES  UPSET  STOMACH AND   NO APPETITE DISCUSSED WITH  DR  Lovena Le  PT  NEEDS  TO  RESTART  FLECAINIDE 75 MG  BID AND  HAVE  MYOVIEW IF NOT ONE DONE  IN LAST  YEAR PT  AWARE  OF  RECOMMENDATIONS  AND  AGREES  WITH PLAN .Adonis Housekeeper

## 2016-02-09 ENCOUNTER — Telehealth: Payer: Self-pay | Admitting: Internal Medicine

## 2016-02-09 NOTE — Telephone Encounter (Signed)
New message    Pt c/o medication issue:  1. Name of Medication: flecainide   2. How are you currently taking this medication (dosage and times per day)? 150mg -50mg   3. Are you having a reaction (difficulty breathing--STAT)? SOB  4. What is your medication issue? She said she is not feeling food by getting off the medications

## 2016-02-09 NOTE — Telephone Encounter (Signed)
Pt states recently advised to take Flecanide 75mg  BID.  Pt states she took Flecanide 50mg  QD Saturday, Sunday and today.  C/O intermittent SOB after she takes the medication, intermittent dizziness and palpitations a few times a day.  Pt states she started taking the 50mg  because she hadn't received the 75's but she did get them Friday but has not started them.  Denies CP or presyncope/syncope.  States BP with wrist cuff has been 150's/80, HR 68-70.  Pt says Dr. Lovena Le told her to go to ER if she develops SOB but she does not want to go.  Pt states she wants to know what Dr. Lovena Le feels like she should do at this time.  Advised I will send message to Dr. Lovena Le and his nurse for review, advisement and follow up.

## 2016-02-12 ENCOUNTER — Other Ambulatory Visit: Payer: Self-pay | Admitting: *Deleted

## 2016-02-12 DIAGNOSIS — R531 Weakness: Secondary | ICD-10-CM | POA: Diagnosis not present

## 2016-02-12 DIAGNOSIS — R079 Chest pain, unspecified: Secondary | ICD-10-CM

## 2016-02-12 NOTE — Telephone Encounter (Signed)
Left message for patient to call back  

## 2016-02-17 ENCOUNTER — Encounter (HOSPITAL_COMMUNITY): Payer: Self-pay

## 2016-02-17 ENCOUNTER — Emergency Department (HOSPITAL_COMMUNITY): Payer: Medicare Other

## 2016-02-17 ENCOUNTER — Emergency Department (HOSPITAL_COMMUNITY)
Admission: EM | Admit: 2016-02-17 | Discharge: 2016-02-17 | Disposition: A | Discharge status: Home / Self Care | Payer: Medicare Other | Attending: Emergency Medicine | Admitting: Emergency Medicine

## 2016-02-17 DIAGNOSIS — Z8673 Personal history of transient ischemic attack (TIA), and cerebral infarction without residual deficits: Secondary | ICD-10-CM | POA: Insufficient documentation

## 2016-02-17 DIAGNOSIS — R0600 Dyspnea, unspecified: Secondary | ICD-10-CM | POA: Diagnosis not present

## 2016-02-17 DIAGNOSIS — Z7982 Long term (current) use of aspirin: Secondary | ICD-10-CM | POA: Diagnosis not present

## 2016-02-17 DIAGNOSIS — I10 Essential (primary) hypertension: Secondary | ICD-10-CM | POA: Diagnosis not present

## 2016-02-17 DIAGNOSIS — Z87891 Personal history of nicotine dependence: Secondary | ICD-10-CM | POA: Diagnosis not present

## 2016-02-17 DIAGNOSIS — R0602 Shortness of breath: Secondary | ICD-10-CM | POA: Diagnosis not present

## 2016-02-17 LAB — I-STAT TROPONIN, ED: Troponin i, poc: 0 ng/mL (ref 0.00–0.08)

## 2016-02-17 LAB — CBC
HCT: 41 % (ref 36.0–46.0)
Hemoglobin: 13.1 g/dL (ref 12.0–15.0)
MCH: 30.5 pg (ref 26.0–34.0)
MCHC: 32 g/dL (ref 30.0–36.0)
MCV: 95.6 fL (ref 78.0–100.0)
Platelets: 296 10*3/uL (ref 150–400)
RBC: 4.29 MIL/uL (ref 3.87–5.11)
RDW: 13.9 % (ref 11.5–15.5)
WBC: 4.6 10*3/uL (ref 4.0–10.5)

## 2016-02-17 LAB — BASIC METABOLIC PANEL
Anion gap: 10 (ref 5–15)
BUN: 12 mg/dL (ref 6–20)
CO2: 25 mmol/L (ref 22–32)
Calcium: 10 mg/dL (ref 8.9–10.3)
Chloride: 105 mmol/L (ref 101–111)
Creatinine, Ser: 0.82 mg/dL (ref 0.44–1.00)
GFR calc Af Amer: >60 mL/min (ref >60)
GFR calc non Af Amer: >60 mL/min (ref >60)
Glucose, Bld: 86 mg/dL (ref 65–99)
Potassium: 3.7 mmol/L (ref 3.5–5.1)
Sodium: 140 mmol/L (ref 135–145)

## 2016-02-17 LAB — D-DIMER, QUANTITATIVE: D-Dimer, Quant: 0.36 ug/mL-FEU (ref 0.00–0.50)

## 2016-02-17 NOTE — ED Notes (Signed)
While ambulating pt, pt's O2 percentages were between 94%-98%. Pt's pulse remained in the low to upper 70's.

## 2016-02-17 NOTE — ED Notes (Signed)
Dr.Resse at bedside.

## 2016-02-17 NOTE — ED Provider Notes (Signed)
[place] [facility] DEPT Provider Note   CSN: [id number] Arrival date & time: 02/17/16  1519     History   Chief Complaint Chief Complaint  Patient presents with  . Shortness of Breath    HPI [name] [name] is a 82 y.o. female.  The history is provided by the patient. No language interpreter was used.  Shortness of Breath    [name] [name] is a 82 y.o. female who presents to the Emergency Department complaining of SOB.  She reports 1 month of intermittent shortness of breath. She attributes the symptoms to her flecainide. She has tried to discontinue it and her symptoms get worse and then she restarts it and her symptoms are also worse. Shortness of breath is intermittent and worse with exertion. She denies any chest pain. She feels like she can't take a deep breath, particularly on the right. She has a history of lower extremity edema but this has resolved over the last 10 days. No fevers, cough, vomiting, diarrhea. She had a normal stress test 3 weeks ago at her cardiologist's office. She recently had a negative lower extremity DVT study. Past Medical History:  Diagnosis Date  . Anemia    iron def  . Anxiety   . Arrhythmia    paroxysmal SVT and symtomatic PAC's  . Diverticulosis    colon  . History of colonic polyps    cecal  . Hypertension   . Osteoporosis 07/21/2011   t score -2.7  . Stricture esophagus   . Vitamin D deficiency    2010    Patient Active Problem List   Diagnosis Date Noted  . Lipoma 02/04/2015  . Eustachian tube dysfunction 01/15/2015  . Skin cancer of arm 09/26/2014  . Vaginal odor 08/23/2014  . Acute sinus infection 05/01/2014  . Chest discomfort 01/20/2014  . Actinic keratoses 08/29/2013  . Palpitations 09/28/2012  . Allergic rhinitis 12/25/2011  . Grief 07/19/2011  . Paresthesia 07/19/2011  . TIA (transient ischemic attack) 07/19/2011  . ATRIAL PREMATURE BEATS 03/20/2009  . VENOUS STASIS ULCER 07/31/2008  . ANEMIA-IRON DEFICIENCY 04/02/2008  .  Anxiety state 04/02/2008  . Essential hypertension 04/02/2008  . GERD 04/02/2008  . DIVERTICULOSIS, COLON 04/02/2008  . OSTEOPOROSIS 04/02/2008  . COLONIC POLYPS, HX OF 04/02/2008    Past Surgical History:  Procedure Laterality Date  . A&P, enterocele repair     s/p 2003  . ABDOMINAL HYSTERECTOMY    . BREAST REDUCTION SURGERY     s/p 1996  . HEMORRHOID SURGERY      OB History    Gravida Para Term Preterm AB Living   2 2       1    SAB TAB Ectopic Multiple Live Births                   Home Medications    Prior to Admission medications   Medication Sig Start Date End Date Taking? Authorizing Provider  aspirin 81 MG tablet Take 81 mg by mouth daily.    Historical Provider, MD  aspirin EC 325 MG tablet Take 325 mg by mouth daily.    Historical Provider, MD  Cholecalciferol (VITAMIN D3) 1000 UNITS CAPS Take 1,000 Units by mouth daily.     Historical Provider, MD  fexofenadine (ALLEGRA) 180 MG tablet Take 1 tablet (180 mg total) by mouth daily. As needed for allergies and chronic sinus Patient not taking: Reported on 12/31/2015 01/15/15   Biagio Borg, MD  flecainide (TAMBOCOR) 50 MG tablet  75 MG   1 1/2  TAB  TWICE  DAILY 01/29/16   Evans Lance, MD  LORazepam (ATIVAN) 1 MG tablet Take 0.5 tablets (0.5 mg total) by mouth daily as needed for anxiety. Patient not taking: Reported on 12/31/2015 08/07/14   Kennyth Arnold, FNP    Family History Family History  Problem Relation Age of Onset  . Stroke Father     @ 26  . Hypertension Father   . Hypertension Sister   . Cancer Sister 5    brain  . Breast cancer Other 42    breast  . Breast cancer Daughter 35  . Cancer Daughter 71    breast  . Cancer Other 54    colon/MELANOMA  . Stroke Mother     medication induced  . Kidney disease Brother     Social History Social History  Substance Use Topics  . Smoking status: Former Smoker    Quit date: 05/24/1968  . Smokeless tobacco: Never Used     Comment: only smoked 7 years  .  Alcohol use No     Allergies   Zyrtec [cetirizine]   Review of Systems Review of Systems  Respiratory: Positive for shortness of breath.   All other systems reviewed and are negative.    Physical Exam Updated Vital Signs BP 167/98   Pulse 71   Temp 97.9 F (36.6 C) (Oral)   Resp 15   LMP 05/24/1970   SpO2 100%   Physical Exam  Constitutional: She is oriented to person, place, and time. She appears well-developed and well-nourished.  HENT:  Head: Normocephalic and atraumatic.  Cardiovascular: Normal rate and regular rhythm.   No murmur heard. Pulmonary/Chest: Effort normal and breath sounds normal. No respiratory distress.  Abdominal: Soft. There is no tenderness. There is no rebound and no guarding.  Musculoskeletal: She exhibits no edema or tenderness.  Neurological: She is alert and oriented to person, place, and time.  Skin: Skin is warm and dry.  Psychiatric: She has a normal mood and affect. Her behavior is normal.  Nursing note and vitals reviewed.    ED Treatments / Results  Labs (all labs ordered are listed, but only abnormal results are displayed) Labs Reviewed  BASIC METABOLIC PANEL  CBC  D-DIMER, QUANTITATIVE (NOT AT [facility])  Randolm Idol, ED    EKG  EKG Interpretation  Date/Time:  Tuesday February 17 2016 15:26:02 EDT Ventricular Rate:  84 PR Interval:  188 QRS Duration: 82 QT Interval:  366 QTC Calculation: 432 R Axis:   65 Text Interpretation:  Normal sinus rhythm Low voltage QRS Borderline ECG No significant change since last tracing Confirmed by Hazle Coca [phone]) on 02/17/2016 7:25:34 PM       Radiology Dg Chest 2 View  Result Date: 02/17/2016 CLINICAL DATA:  Shortness of breath, hypertension, history of cardiac dysrhythmias. EXAM: CHEST  2 VIEW COMPARISON:  PA and lateral chest x-ray of January 20, 2014 FINDINGS: The lungs remain hyperinflated with hemidiaphragm flattening. There is no focal infiltrate. There is no pleural  effusion. The heart and pulmonary vascularity are normal. There is calcification in the wall of the thoracic aorta. The bony thorax exhibits no acute abnormality. IMPRESSION: COPD.  There is no active cardiopulmonary disease. Aortic atherosclerosis. Electronically Signed   By: David  Martinique M.D.   On: 02/17/2016 16:07    Procedures Procedures (including critical care time)  Medications Ordered in ED Medications - No data to display   Initial Impression /  Assessment and Plan / ED Course  I have reviewed the triage vital signs and the nursing notes.  Pertinent labs & imaging results that were available during my care of the patient were reviewed by me and considered in my medical decision making (see chart for details).  Clinical Course    Patient here for evaluation of intermittent shortness of breath that she attributes to flecainide use. She is in no distress in the emergency department with no hypoxia, no respiratory distress, no significant dyspnea. No evidence of pneumonia, acute CHF, PE, ACS, COPD exacerbation, arrhythmia. Discussed with patient home care for dyspnea as well as cardiology follow-up for further discussions on continuation of her flecainide.  Final Clinical Impressions(s) / ED Diagnoses   Final diagnoses:  Dyspnea    New Prescriptions Discharge Medication List as of 02/17/2016  9:15 PM       Quintella Reichert, MD 02/18/16 0009

## 2016-02-17 NOTE — ED Triage Notes (Signed)
Pt reports she was taken off Flecenide and reports having dysrhythmias. She began taking flecanide again and now has shortness of breath. Resp e/u, but feels as though she cannot get a deep breath.

## 2016-02-18 ENCOUNTER — Telehealth (HOSPITAL_COMMUNITY): Payer: Self-pay | Admitting: *Deleted

## 2016-02-18 NOTE — Telephone Encounter (Signed)
Left message on voicemail per DPR in reference to upcoming appointment scheduled on 02/20/16 with detailed instructions given per Myocardial Perfusion Study Information Sheet for the test. LM to arrive 15 minutes early, and that it is imperative to arrive on time for appointment to keep from having the test rescheduled. If you need to cancel or reschedule your appointment, please call the office within 24 hours of your appointment. Failure to do so may result in a cancellation of your appointment, and a $50 no show fee. Phone number given for call back for any questions.   Bailey Faiella Jacqueline    

## 2016-02-20 ENCOUNTER — Encounter (HOSPITAL_COMMUNITY): Payer: Medicare Other

## 2016-03-12 ENCOUNTER — Encounter: Payer: Self-pay | Admitting: *Deleted

## 2016-03-12 ENCOUNTER — Telehealth: Payer: Self-pay | Admitting: Internal Medicine

## 2016-03-12 ENCOUNTER — Ambulatory Visit (INDEPENDENT_AMBULATORY_CARE_PROVIDER_SITE_OTHER): Payer: Medicare Other | Admitting: Nurse Practitioner

## 2016-03-12 ENCOUNTER — Encounter: Payer: Self-pay | Admitting: Nurse Practitioner

## 2016-03-12 VITALS — BP 162/110 | HR 90 | Temp 97.8°F | Ht 65.0 in | Wt 160.0 lb

## 2016-03-12 DIAGNOSIS — M7989 Other specified soft tissue disorders: Secondary | ICD-10-CM

## 2016-03-12 DIAGNOSIS — M79662 Pain in left lower leg: Secondary | ICD-10-CM | POA: Diagnosis not present

## 2016-03-12 NOTE — Telephone Encounter (Signed)
Called, spoke with pt. Pt stated she was concerned about BP 151/110 . Pt was seen by PCP today. BP at PCP was 162/110. Pt seen today for left leg pain. Office note stated  Fexofenadine was d/c'd today. Cardiovascular exam states pt's HR normal, regular rhythm, normal heart sounds. Pt stated she was taking 50 mg of Flecainide twice a day. I informed her the last Rx on file for her from Dr. Lovena Le states 75 mg twice a day. Pt said she will start taking the medication as prescribed tomorrow. Pt was alert and oriented. Pt denies SOB or CP. Informed pt to follow-up with PCP for BP related issues. Dr. Lovena Le seeing pt for palpitations only. Pt's next appt is 04/26/16 with Dr. Lovena Le. Pt would like to be seen before taht time. I will check with the scheduling department, to see if pt could be worked in sooner. Pt took vitals at home. BP at 5:30 PM 165/99, HE 96. Advised pt to call PCP with non-emergent concerns r/t BP, report to urgent care or the hospital with urgent needs (SOB, dizziness, chest pain, lightheadedness). Pt verbalized understanding and agreed with plan.

## 2016-03-12 NOTE — Progress Notes (Signed)
Subjective:  Patient ID: [name] [name], female    DOB: 11-06-1929  Age: 82 y.o. MRN: [id number]  CC: Leg Swelling (left back have a knot/painful for about 1 week)   Leg Pain   The incident occurred 2 days ago. There was no injury mechanism. The pain is present in the left knee, left ankle and left leg. The quality of the pain is described as aching. The pain is moderate. The pain has been constant since onset. Pertinent negatives include no inability to bear weight, loss of motion, loss of sensation, muscle weakness, numbness or tingling. Associated symptoms comments: Swelling, but no redness. She reports no foreign bodies present. The symptoms are aggravated by weight bearing and movement. She has tried elevation for the symptoms. The treatment provided mild relief.    Outpatient Medications Prior to Visit  Medication Sig Dispense Refill  . aspirin 81 MG tablet Take 81 mg by mouth daily.    Marland Kitchen aspirin EC 325 MG tablet Take 325 mg by mouth daily.    . Cholecalciferol (VITAMIN D3) 1000 UNITS CAPS Take 1,000 Units by mouth daily.     . flecainide (TAMBOCOR) 50 MG tablet 75 MG   1 1/2  TAB  TWICE  DAILY 270 tablet 3  . LORazepam (ATIVAN) 1 MG tablet Take 0.5 tablets (0.5 mg total) by mouth daily as needed for anxiety. 30 tablet 0  . fexofenadine (ALLEGRA) 180 MG tablet Take 1 tablet (180 mg total) by mouth daily. As needed for allergies and chronic sinus (Patient not taking: Reported on 12/31/2015) 30 tablet 11   No facility-administered medications prior to visit.     ROS See HPI  Objective:  BP (!) 162/110 (BP Location: Left Arm, Patient Position: Sitting, Cuff Size: Normal)   Pulse 90   Temp 97.8 F (36.6 C)   Ht 5\' 5"  (1.651 m)   Wt 160 lb (72.6 kg)   LMP 05/24/1970   SpO2 96%   BMI 26.63 kg/m   BP Readings from Last 3 Encounters:  03/12/16 (!) 162/110  02/17/16 167/98  12/31/15 (!) 154/90    Wt Readings from Last 3 Encounters:  03/12/16 160 lb (72.6 kg)  12/31/15 165 lb  (74.8 kg)  12/16/15 166 lb 3.2 oz (75.4 kg)    Physical Exam  Constitutional: She is oriented to person, place, and time.  Cardiovascular: Normal rate, regular rhythm and normal heart sounds.   Pulmonary/Chest: Effort normal.  Musculoskeletal: She exhibits edema and tenderness.       Left knee: She exhibits effusion. She exhibits no erythema and no bony tenderness. No tenderness found.       Left ankle: She exhibits swelling. No tenderness.       Left lower leg: She exhibits swelling and edema.       Left foot: There is swelling.  Left posterior knee fullness. No LE erythema. Unable to fully fle left knee  Neurological: She is alert and oriented to person, place, and time.  Vitals reviewed.   Lab Results  Component Value Date   WBC 4.6 02/17/2016   HGB 13.1 02/17/2016   HCT 41.0 02/17/2016   PLT 296 02/17/2016   GLUCOSE 86 02/17/2016   CHOL 170 10/31/2013   TRIG 132.0 10/31/2013   HDL 50.30 10/31/2013   LDLDIRECT 108.9 07/19/2011   LDLCALC 93 10/31/2013   ALT 22 08/31/2014   AST 26 08/31/2014   NA 140 02/17/2016   K 3.7 02/17/2016   CL 105 02/17/2016  CREATININE 0.82 02/17/2016   BUN 12 02/17/2016   CO2 25 02/17/2016   TSH 2.410 07/28/2014   INR 1.07 07/28/2014    Dg Chest 2 View  Result Date: 02/17/2016 CLINICAL DATA:  Shortness of breath, hypertension, history of cardiac dysrhythmias. EXAM: CHEST  2 VIEW COMPARISON:  PA and lateral chest x-ray of January 20, 2014 FINDINGS: The lungs remain hyperinflated with hemidiaphragm flattening. There is no focal infiltrate. There is no pleural effusion. The heart and pulmonary vascularity are normal. There is calcification in the wall of the thoracic aorta. The bony thorax exhibits no acute abnormality. IMPRESSION: COPD.  There is no active cardiopulmonary disease. Aortic atherosclerosis. Electronically Signed   By: David  Martinique M.D.   On: 02/17/2016 16:07    Assessment & Plan:   Annabellee was seen today for leg  swelling.  Diagnoses and all orders for this visit:  Pain and swelling of left lower leg -     VAS [place] LOWER EXTREMITY VENOUS (DVT); Future   I have discontinued Ms. Brookens's fexofenadine. I am also having her maintain her Vitamin D3, LORazepam, aspirin, aspirin EC, and flecainide.  No orders of the defined types were placed in this encounter.   Follow-up: Return if symptoms worsen or fail to improve.  Wilfred Lacy, NP

## 2016-03-12 NOTE — Progress Notes (Signed)
Pre visit review using our clinic review tool, if applicable. No additional management support is needed unless otherwise documented below in the visit note. 

## 2016-03-12 NOTE — Patient Instructions (Signed)
Venous doppler to rule out DVT vs baker's cyst. Consider referral to orthopedic for cyst aspiration if doppler if negative.

## 2016-03-12 NOTE — Telephone Encounter (Signed)
Pt c/o BP issue: STAT if pt c/o blurred vision, one-sided weakness or slurred speech  1. What are your last 5 BP readings? 151/110   HR 90  2. Are you having any other symptoms (ex. Dizziness, headache, blurred vision, passed out)? Headache earlier 3. What is your BP issue? high

## 2016-03-13 ENCOUNTER — Emergency Department (HOSPITAL_COMMUNITY): Payer: Medicare Other

## 2016-03-13 ENCOUNTER — Observation Stay (HOSPITAL_COMMUNITY)
Admission: EM | Admit: 2016-03-13 | Discharge: 2016-03-15 | Disposition: A | Discharge status: Home / Self Care | Payer: Medicare Other | Attending: Internal Medicine | Admitting: Internal Medicine

## 2016-03-13 ENCOUNTER — Encounter (HOSPITAL_COMMUNITY): Payer: Self-pay | Admitting: Emergency Medicine

## 2016-03-13 DIAGNOSIS — M79662 Pain in left lower leg: Secondary | ICD-10-CM | POA: Diagnosis not present

## 2016-03-13 DIAGNOSIS — J449 Chronic obstructive pulmonary disease, unspecified: Secondary | ICD-10-CM | POA: Diagnosis not present

## 2016-03-13 DIAGNOSIS — Z888 Allergy status to other drugs, medicaments and biological substances status: Secondary | ICD-10-CM | POA: Insufficient documentation

## 2016-03-13 DIAGNOSIS — M25572 Pain in left ankle and joints of left foot: Secondary | ICD-10-CM | POA: Insufficient documentation

## 2016-03-13 DIAGNOSIS — R0600 Dyspnea, unspecified: Secondary | ICD-10-CM

## 2016-03-13 DIAGNOSIS — M810 Age-related osteoporosis without current pathological fracture: Secondary | ICD-10-CM | POA: Diagnosis not present

## 2016-03-13 DIAGNOSIS — I700 Atherosclerosis of aorta: Secondary | ICD-10-CM | POA: Insufficient documentation

## 2016-03-13 DIAGNOSIS — E559 Vitamin D deficiency, unspecified: Secondary | ICD-10-CM | POA: Insufficient documentation

## 2016-03-13 DIAGNOSIS — F411 Generalized anxiety disorder: Secondary | ICD-10-CM | POA: Insufficient documentation

## 2016-03-13 DIAGNOSIS — M8588 Other specified disorders of bone density and structure, other site: Secondary | ICD-10-CM | POA: Diagnosis not present

## 2016-03-13 DIAGNOSIS — I471 Supraventricular tachycardia: Secondary | ICD-10-CM | POA: Insufficient documentation

## 2016-03-13 DIAGNOSIS — Z87891 Personal history of nicotine dependence: Secondary | ICD-10-CM | POA: Insufficient documentation

## 2016-03-13 DIAGNOSIS — I10 Essential (primary) hypertension: Secondary | ICD-10-CM | POA: Diagnosis present

## 2016-03-13 DIAGNOSIS — Z7982 Long term (current) use of aspirin: Secondary | ICD-10-CM | POA: Diagnosis not present

## 2016-03-13 DIAGNOSIS — R079 Chest pain, unspecified: Secondary | ICD-10-CM

## 2016-03-13 DIAGNOSIS — I491 Atrial premature depolarization: Secondary | ICD-10-CM | POA: Diagnosis not present

## 2016-03-13 DIAGNOSIS — R071 Chest pain on breathing: Principal | ICD-10-CM | POA: Insufficient documentation

## 2016-03-13 DIAGNOSIS — I119 Hypertensive heart disease without heart failure: Secondary | ICD-10-CM | POA: Insufficient documentation

## 2016-03-13 DIAGNOSIS — R030 Elevated blood-pressure reading, without diagnosis of hypertension: Secondary | ICD-10-CM | POA: Diagnosis not present

## 2016-03-13 DIAGNOSIS — Z86010 Personal history of colonic polyps: Secondary | ICD-10-CM | POA: Diagnosis not present

## 2016-03-13 DIAGNOSIS — I493 Ventricular premature depolarization: Secondary | ICD-10-CM | POA: Insufficient documentation

## 2016-03-13 DIAGNOSIS — F419 Anxiety disorder, unspecified: Secondary | ICD-10-CM | POA: Diagnosis present

## 2016-03-13 DIAGNOSIS — R002 Palpitations: Secondary | ICD-10-CM | POA: Insufficient documentation

## 2016-03-13 LAB — TSH: TSH: 3.987 u[IU]/mL (ref 0.350–4.500)

## 2016-03-13 LAB — URINALYSIS, ROUTINE W REFLEX MICROSCOPIC
Bilirubin Urine: NEGATIVE
Glucose, UA: NEGATIVE mg/dL
Hgb urine dipstick: NEGATIVE
Ketones, ur: 15 mg/dL — AB
Nitrite: NEGATIVE
Protein, ur: NEGATIVE mg/dL
Specific Gravity, Urine: 1.011 (ref 1.005–1.030)
pH: 7.5 (ref 5.0–8.0)

## 2016-03-13 LAB — CBC
HCT: 39.9 % (ref 36.0–46.0)
Hemoglobin: 13.6 g/dL (ref 12.0–15.0)
MCH: 31.5 pg (ref 26.0–34.0)
MCHC: 34.1 g/dL (ref 30.0–36.0)
MCV: 92.4 fL (ref 78.0–100.0)
Platelets: 295 10*3/uL (ref 150–400)
RBC: 4.32 MIL/uL (ref 3.87–5.11)
RDW: 13.5 % (ref 11.5–15.5)
WBC: 4.4 10*3/uL (ref 4.0–10.5)

## 2016-03-13 LAB — BASIC METABOLIC PANEL
Anion gap: 10 (ref 5–15)
BUN: 12 mg/dL (ref 6–20)
CO2: 24 mmol/L (ref 22–32)
Calcium: 9.6 mg/dL (ref 8.9–10.3)
Chloride: 107 mmol/L (ref 101–111)
Creatinine, Ser: 0.75 mg/dL (ref 0.44–1.00)
GFR calc Af Amer: >60 mL/min (ref >60)
GFR calc non Af Amer: >60 mL/min (ref >60)
Glucose, Bld: 101 mg/dL — ABNORMAL HIGH (ref 65–99)
Potassium: 3.5 mmol/L (ref 3.5–5.1)
Sodium: 141 mmol/L (ref 135–145)

## 2016-03-13 LAB — D-DIMER, QUANTITATIVE (NOT AT ARMC): D-Dimer, Quant: 0.55 ug/mL-FEU — ABNORMAL HIGH (ref 0.00–0.50)

## 2016-03-13 LAB — MAGNESIUM: Magnesium: 2.1 mg/dL (ref 1.7–2.4)

## 2016-03-13 LAB — URINE MICROSCOPIC-ADD ON

## 2016-03-13 LAB — I-STAT TROPONIN, ED: Troponin i, poc: 0 ng/mL (ref 0.00–0.08)

## 2016-03-13 MED ORDER — SODIUM CHLORIDE 0.9 % IV BOLUS (SEPSIS)
500.0000 mL | Freq: Once | INTRAVENOUS | Status: AC
  Administered 2016-03-13: 500 mL via INTRAVENOUS

## 2016-03-13 MED ORDER — IOPAMIDOL (ISOVUE-370) INJECTION 76%
INTRAVENOUS | Status: AC
  Administered 2016-03-14: 100 mL
  Filled 2016-03-13: qty 100

## 2016-03-13 MED ORDER — LORAZEPAM 2 MG/ML IJ SOLN
0.5000 mg | Freq: Once | INTRAMUSCULAR | Status: AC
  Administered 2016-03-13: 0.5 mg via INTRAVENOUS
  Filled 2016-03-13: qty 1

## 2016-03-13 NOTE — ED Notes (Signed)
Patient transported to X-ray 

## 2016-03-13 NOTE — ED Notes (Signed)
Pt ambulated to bathroom. Stated she felt like her heart was "going to run away from me". Complains of slight dizziness upon return to room. RN notified.

## 2016-03-13 NOTE — ED Triage Notes (Signed)
Per [facility]: Pt to ED from home c/o recurrent and more frequent PVCs starting this morning. Was here 3 weeks ago for same thing - was in A-Fib. Pt knows when each PVC occurs because she feels her heart temporarily race and a pain in upper abdomen to just under L breast. Pt A&O x 4. EMS VS: 240/146 at first, 177/91 just PTA. HR 100 NSR with PVCs. Pt reports she has taken flecainide for 18 years, but did not take it this morning.

## 2016-03-13 NOTE — ED Provider Notes (Signed)
[facility] DEPT Provider Note   CSN: [id number] Arrival date & time: 03/13/16  1518     History   Chief Complaint Chief Complaint  Patient presents with  . Palpitations    HPI [name] [name] is a 82 y.o. female.  HPI Patient presents with increasing palpitations over the last few weeks. States she was initially taken off of her flecainide by her cardiologist but had worsening palpitations. She started back up to 3 days. She states yesterday she has been having episodic palpitations that she describes as racing and irregular. She took her flecainide yesterday morning but felt this made the palpitations worse. She can drink a cup of coffee this morning with exacerbation of her palpitations. States she laid down and took her blood pressure with elevated systolic blood pressure in the 170s and heart rate in the 90s. She stood up and became lightheaded near syncopal. At this point she called EMS. She states she is still having periodic palpitations. She denies any recent illness. No urinary symptoms or URI symptoms. No fever or chills. Past Medical History:  Diagnosis Date  . Anemia    iron def  . Anxiety   . Arrhythmia    paroxysmal SVT and symtomatic PAC's  . Diverticulosis    colon  . History of colonic polyps    cecal  . Hypertension   . Osteoporosis 07/21/2011   t score -2.7  . Stricture esophagus   . Vitamin D deficiency    2010    Patient Active Problem List   Diagnosis Date Noted  . Presbycusis of both ears 01/15/2016  . Tinnitus of right ear 01/15/2016  . Lipoma 02/04/2015  . Eustachian tube dysfunction 01/15/2015  . Skin cancer of arm 09/26/2014  . Vaginal odor 08/23/2014  . Acute sinus infection 05/01/2014  . Chest discomfort 01/20/2014  . Actinic keratoses 08/29/2013  . Palpitations 09/28/2012  . Allergic rhinitis 12/25/2011  . Grief 07/19/2011  . Paresthesia 07/19/2011  . TIA (transient ischemic attack) 07/19/2011  . ATRIAL PREMATURE BEATS 03/20/2009    . VENOUS STASIS ULCER 07/31/2008  . ANEMIA-IRON DEFICIENCY 04/02/2008  . Anxiety state 04/02/2008  . Essential hypertension 04/02/2008  . GERD 04/02/2008  . DIVERTICULOSIS, COLON 04/02/2008  . OSTEOPOROSIS 04/02/2008  . COLONIC POLYPS, HX OF 04/02/2008    Past Surgical History:  Procedure Laterality Date  . A&P, enterocele repair     s/p 2003  . ABDOMINAL HYSTERECTOMY    . BREAST REDUCTION SURGERY     s/p 1996  . HEMORRHOID SURGERY      OB History    Gravida Para Term Preterm AB Living   2 2       1    SAB TAB Ectopic Multiple Live Births                   Home Medications    Prior to Admission medications   Medication Sig Start Date End Date Taking? Authorizing Provider  aspirin 81 MG tablet Take 81 mg by mouth daily.   Yes Historical Provider, MD  aspirin EC 325 MG tablet Take 325 mg by mouth daily.   Yes Historical Provider, MD  Cholecalciferol (VITAMIN D3) 1000 UNITS CAPS Take 1,000 Units by mouth daily.    Yes Historical Provider, MD  flecainide (TAMBOCOR) 50 MG tablet 75 MG   1 1/2  TAB  TWICE  DAILY 01/29/16  Yes Evans Lance, MD  LORazepam (ATIVAN) 1 MG tablet Take 0.5 tablets (0.5 mg total)  by mouth daily as needed for anxiety. Patient not taking: Reported on 03/13/2016 08/07/14   Kennyth Arnold, FNP    Family History Family History  Problem Relation Age of Onset  . Stroke Father     @ 48  . Hypertension Father   . Hypertension Sister   . Cancer Sister 20    brain  . Breast cancer Other 42    breast  . Breast cancer Daughter 54  . Cancer Daughter 91    breast  . Cancer Other 58    colon/MELANOMA  . Stroke Mother     medication induced  . Kidney disease Brother     Social History Social History  Substance Use Topics  . Smoking status: Former Smoker    Quit date: 05/24/1968  . Smokeless tobacco: Never Used     Comment: only smoked 7 years  . Alcohol use No     Allergies   Zyrtec [cetirizine]   Review of Systems Review of Systems   Constitutional: Negative for chills, fatigue and fever.  Respiratory: Negative for cough, shortness of breath and wheezing.   Cardiovascular: Positive for palpitations and leg swelling. Negative for chest pain.  Gastrointestinal: Negative for abdominal pain, constipation, diarrhea, nausea and vomiting.  Genitourinary: Negative for difficulty urinating, dysuria, flank pain and frequency.  Musculoskeletal: Negative for back pain, neck pain and neck stiffness.  Skin: Negative for rash and wound.  Neurological: Positive for tremors and light-headedness. Negative for syncope, weakness, numbness and headaches.  Psychiatric/Behavioral: The patient is nervous/anxious.   All other systems reviewed and are negative.    Physical Exam Updated Vital Signs BP 126/57   Pulse 63   Temp 98 F (36.7 C) (Oral)   Resp 13   Ht 5\' 5"  (1.651 m)   Wt 160 lb (72.6 kg)   LMP 05/24/1970   SpO2 96%   BMI 26.63 kg/m   Physical Exam  Constitutional: She is oriented to person, place, and time. She appears well-developed and well-nourished. No distress.  HENT:  Head: Normocephalic and atraumatic.  Mouth/Throat: Oropharynx is clear and moist.  Eyes: EOM are normal. Pupils are equal, round, and reactive to light.  Neck: Normal range of motion. Neck supple. No JVD present.  Cardiovascular: Normal rate.  Exam reveals no gallop and no friction rub.   No murmur heard. Occasional irregular beats  Pulmonary/Chest: Effort normal and breath sounds normal. No respiratory distress. She has no wheezes. She has no rales. She exhibits no tenderness.  Abdominal: Soft. Bowel sounds are normal. There is no tenderness. There is no rebound and no guarding.  Musculoskeletal: Normal range of motion. She exhibits no edema or tenderness.  1+ bilateral pitting edema. No calf tenderness. Distal pulses are equal  Neurological: She is alert and oriented to person, place, and time.  Moves all extremities without deficit. Sensation  fully intact.  Skin: Skin is warm and dry. Capillary refill takes less than 2 seconds. No rash noted. No erythema.  Psychiatric: She has a normal mood and affect. Her behavior is normal.  Nursing note and vitals reviewed.    ED Treatments / Results  Labs (all labs ordered are listed, but only abnormal results are displayed) Labs Reviewed  BASIC METABOLIC PANEL - Abnormal; Notable for the following:       Result Value   Glucose, Bld 101 (*)    All other components within normal limits  URINALYSIS, ROUTINE W REFLEX MICROSCOPIC (NOT AT [facility]) - Abnormal; Notable for the  following:    Ketones, ur 15 (*)    Leukocytes, UA TRACE (*)    All other components within normal limits  URINE MICROSCOPIC-ADD ON - Abnormal; Notable for the following:    Squamous Epithelial / LPF 0-5 (*)    Bacteria, UA FEW (*)    All other components within normal limits  D-DIMER, QUANTITATIVE (NOT AT [facility]) - Abnormal; Notable for the following:    D-Dimer, Quant 0.55 (*)    All other components within normal limits  CBC  TSH  MAGNESIUM  I-STAT TROPOININ, ED    EKG  EKG Interpretation  Date/Time:  Saturday March 13 2016 15:33:03 EDT Ventricular Rate:  86 PR Interval:    QRS Duration: 100 QT Interval:  370 QTC Calculation: 443 R Axis:   63 Text Interpretation:  Sinus rhythm Low voltage, precordial leads Confirmed by Lita Mains  MD, Maiah Sinning ([id number]) on 03/13/2016 4:11:32 PM       Radiology Dg Chest 2 View  Result Date: 03/13/2016 CLINICAL DATA:  Chest pain EXAM: CHEST  2 VIEW COMPARISON:  02/17/2016 FINDINGS: Cardiomediastinal silhouette is stable. Mild hyperinflation again noted. Stable left basilar scarring. Atherosclerotic calcifications of thoracic aorta. No infiltrate or pulmonary edema. Thoracic spine osteopenia IMPRESSION: No active cardiopulmonary disease.  Mild hyperinflation again noted. Electronically Signed   By: Lahoma Crocker M.D.   On: 03/13/2016 17:26    Procedures Procedures (including  critical care time)  Medications Ordered in ED Medications  sodium chloride 0.9 % bolus 500 mL (500 mLs Intravenous New Bag/Given 03/13/16 2252)  sodium chloride 0.9 % bolus 500 mL (0 mLs Intravenous Stopped 03/13/16 1822)  LORazepam (ATIVAN) injection 0.5 mg (0.5 mg Intravenous Given 03/13/16 1730)  sodium chloride 0.9 % bolus 500 mL (0 mLs Intravenous Stopped 03/13/16 2015)     Initial Impression / Assessment and Plan / ED Course  I have reviewed the triage vital signs and the nursing notes.  Pertinent labs & imaging results that were available during my care of the patient were reviewed by me and considered in my medical decision making (see chart for details).  Clinical Course   Patient became lightheaded while standing up to go to the bathroom. Continues to have intermittent PVCs. Workup is essentially negative. Discussed with cardiology saw patient in emergency department. Recommended d-dimer and TSH. If d-dimer is normal, will admit to telemetry for observation.  Will sign out to oncoming emergency physician. Final Clinical Impressions(s) / ED Diagnoses   Final diagnoses:  Palpitations    New Prescriptions New Prescriptions   No medications on file     Julianne Rice, MD 03/13/16 2330

## 2016-03-14 DIAGNOSIS — F411 Generalized anxiety disorder: Secondary | ICD-10-CM

## 2016-03-14 DIAGNOSIS — R071 Chest pain on breathing: Secondary | ICD-10-CM | POA: Diagnosis not present

## 2016-03-14 DIAGNOSIS — R002 Palpitations: Secondary | ICD-10-CM | POA: Diagnosis not present

## 2016-03-14 DIAGNOSIS — I491 Atrial premature depolarization: Secondary | ICD-10-CM

## 2016-03-14 DIAGNOSIS — R0789 Other chest pain: Secondary | ICD-10-CM

## 2016-03-14 DIAGNOSIS — R079 Chest pain, unspecified: Secondary | ICD-10-CM | POA: Diagnosis not present

## 2016-03-14 LAB — TROPONIN I
Troponin I: <0.03 ng/mL (ref <0.03)
Troponin I: <0.03 ng/mL (ref <0.03)
Troponin I: <0.03 ng/mL (ref <0.03)

## 2016-03-14 MED ORDER — ENSURE ENLIVE PO LIQD
237.0000 mL | Freq: Two times a day (BID) | ORAL | Status: DC
  Administered 2016-03-14 – 2016-03-15 (×3): 237 mL via ORAL

## 2016-03-14 MED ORDER — ASPIRIN 81 MG PO CHEW
81.0000 mg | CHEWABLE_TABLET | Freq: Every day | ORAL | Status: DC
  Administered 2016-03-14 – 2016-03-15 (×2): 81 mg via ORAL
  Filled 2016-03-14 (×2): qty 1

## 2016-03-14 MED ORDER — INFLUENZA VAC SPLIT QUAD 0.5 ML IM SUSY
0.5000 mL | PREFILLED_SYRINGE | Freq: Once | INTRAMUSCULAR | Status: AC
  Administered 2016-03-14: 0.5 mL via INTRAMUSCULAR
  Filled 2016-03-14: qty 0.5

## 2016-03-14 MED ORDER — ONDANSETRON HCL 4 MG/2ML IJ SOLN: 4.0000 mg | Freq: Four times a day (QID) | INTRAMUSCULAR | Status: DC | PRN

## 2016-03-14 MED ORDER — ACETAMINOPHEN 325 MG PO TABS: 650.0000 mg | ORAL_TABLET | ORAL | Status: DC | PRN

## 2016-03-14 MED ORDER — ENOXAPARIN SODIUM 40 MG/0.4ML ~~LOC~~ SOLN
40.0000 mg | SUBCUTANEOUS | Status: DC
  Administered 2016-03-14 – 2016-03-15 (×2): 40 mg via SUBCUTANEOUS
  Filled 2016-03-14 (×2): qty 0.4

## 2016-03-14 NOTE — H&P (Signed)
History and Physical  Primary Cardiologist: Lovena Le PCP: Hoyt Koch, MD  Chief Complaint: Chest pain, shortness of breath, palpitations  HPI:  82 YO female with history of pSVT, PACs presenting to the ED with several complaints.    She has a history of symptomatic PACs/PVCs and had previously been on flecanide.  More recently had complained of shortness of breath and other exertional symptoms.  A stress test had been ordered by Dr. Lovena Le / Devra Dopp to better evaluate her symptoms and to rule out underlying ischemia while on flecanide.  Ms. Barresi missed this appointment (said she did not feel well enough to come that day) and in the interim restarted herself back on flecanide.  More recently, has had 2 days of worsening exertional shortness of breath.  This AM, she reports an episode of severe chest pain on exertion while walking to the bathroom associated with heart racing and palpitations.   She saw a Dr. yesterday for left leg swelling and per her report PVLs were done (results unknown).  Chart review shows PVLs ordered but not done. She does note pain in her L calf/posterior knee and left ankle edema. Dimer + in ED and CTA PE negative for PE in main branches with some artifact.   Had a previous ED admission for similar symptoms a month ago.   No N/V, bleeding bruising, abd pain or other concerns.    Prior Studies TTE 12/26/15 - Left ventricle: The cavity size was normal. Wall thickness was   normal. Systolic function was normal. The estimated ejection   fraction was in the range of 55% to 60%. Wall motion was normal;   there were no regional wall motion abnormalities. Doppler   parameters are consistent with abnormal left ventricular   relaxation (grade 1 diastolic dysfunction). - Aortic valve: Trileaflet; mildly thickened, mildly calcified   leaflets. - Mitral valve: There was trivial regurgitation. - Right ventricle: The cavity size was mildly dilated. Wall  thickness was normal. - Tricuspid valve: There was trivial regurgitation. Past Medical History:  Diagnosis Date  . Anemia    iron def  . Anxiety   . Arrhythmia    paroxysmal SVT and symtomatic PAC's  . Diverticulosis    colon  . History of colonic polyps    cecal  . Hypertension   . Osteoporosis 07/21/2011   t score -2.7  . Stricture esophagus   . Vitamin D deficiency    2010    Past Surgical History:  Procedure Laterality Date  . A&P, enterocele repair     s/p 2003  . ABDOMINAL HYSTERECTOMY    . BREAST REDUCTION SURGERY     s/p 1996  . HEMORRHOID SURGERY      Family History  Problem Relation Age of Onset  . Stroke Father     @ 97  . Hypertension Father   . Hypertension Sister   . Cancer Sister 66    brain  . Breast cancer Other 42    breast  . Breast cancer Daughter 11  . Cancer Daughter 64    breast  . Cancer Other 53    colon/MELANOMA  . Stroke Mother     medication induced  . Kidney disease Brother    Social History:  reports that she quit smoking about 47 years ago. She has never used smokeless tobacco. She reports that she does not drink alcohol or use drugs.  Allergies:  Allergies  Allergen Reactions  . Zyrtec [Cetirizine]  Palpitations    No current facility-administered medications on file prior to encounter.    Current Outpatient Prescriptions on File Prior to Encounter  Medication Sig Dispense Refill  . aspirin 81 MG tablet Take 81 mg by mouth daily.    Marland Kitchen aspirin EC 325 MG tablet Take 325 mg by mouth daily.    . Cholecalciferol (VITAMIN D3) 1000 UNITS CAPS Take 1,000 Units by mouth daily.     . flecainide (TAMBOCOR) 50 MG tablet 75 MG   1 1/2  TAB  TWICE  DAILY 270 tablet 3  . LORazepam (ATIVAN) 1 MG tablet Take 0.5 tablets (0.5 mg total) by mouth daily as needed for anxiety. (Patient not taking: Reported on 03/13/2016) 30 tablet 0    Results for orders placed or performed during the hospital encounter of 03/13/16 (from the past 48  hour(s))  Urinalysis, Routine w reflex microscopic (not at [facility])     Status: Abnormal   Collection Time: 03/13/16  4:01 PM  Result Value Ref Range   Color, Urine YELLOW YELLOW   APPearance CLEAR CLEAR   Specific Gravity, Urine 1.011 1.005 - 1.030   pH 7.5 5.0 - 8.0   Glucose, UA NEGATIVE NEGATIVE mg/dL   Hgb urine dipstick NEGATIVE NEGATIVE   Bilirubin Urine NEGATIVE NEGATIVE   Ketones, ur 15 (A) NEGATIVE mg/dL   Protein, ur NEGATIVE NEGATIVE mg/dL   Nitrite NEGATIVE NEGATIVE   Leukocytes, UA TRACE (A) NEGATIVE  Urine microscopic-add on     Status: Abnormal   Collection Time: 03/13/16  4:01 PM  Result Value Ref Range   Squamous Epithelial / LPF 0-5 (A) NONE SEEN   WBC, UA 0-5 0 - 5 WBC/hpf   RBC / HPF 0-5 0 - 5 RBC/hpf   Bacteria, UA FEW (A) NONE SEEN   Urine-Other AMORPHOUS URATES/PHOSPHATES   Basic metabolic panel     Status: Abnormal   Collection Time: 03/13/16  4:09 PM  Result Value Ref Range   Sodium 141 135 - 145 mmol/L   Potassium 3.5 3.5 - 5.1 mmol/L   Chloride 107 101 - 111 mmol/L   CO2 24 22 - 32 mmol/L   Glucose, Bld 101 (H) 65 - 99 mg/dL   BUN 12 6 - 20 mg/dL   Creatinine, Ser 0.75 0.44 - 1.00 mg/dL   Calcium 9.6 8.9 - 10.3 mg/dL   GFR calc non Af Amer >60 >60 mL/min   GFR calc Af Amer >60 >60 mL/min    Comment: (NOTE) The eGFR has been calculated using the CKD EPI equation. This calculation has not been validated in all clinical situations. eGFR's persistently <60 mL/min signify possible Chronic Kidney Disease.    Anion gap 10 5 - 15  CBC     Status: None   Collection Time: 03/13/16  4:09 PM  Result Value Ref Range   WBC 4.4 4.0 - 10.5 K/uL   RBC 4.32 3.87 - 5.11 MIL/uL   Hemoglobin 13.6 12.0 - 15.0 g/dL   HCT 39.9 36.0 - 46.0 %   MCV 92.4 78.0 - 100.0 fL   MCH 31.5 26.0 - 34.0 pg   MCHC 34.1 30.0 - 36.0 g/dL   RDW 13.5 11.5 - 15.5 %   Platelets 295 150 - 400 K/uL  I-stat troponin, ED     Status: None   Collection Time: 03/13/16  4:19 PM  Result  Value Ref Range   Troponin i, poc 0.00 0.00 - 0.08 ng/mL   Comment 3  Comment: Due to the release kinetics of cTnI, a negative result within the first hours of the onset of symptoms does not rule out myocardial infarction with certainty. If myocardial infarction is still suspected, repeat the test at appropriate intervals.   TSH     Status: None   Collection Time: 03/13/16 10:53 PM  Result Value Ref Range   TSH 3.987 0.350 - 4.500 uIU/mL    Comment: Performed by a 3rd Generation assay with a functional sensitivity of <=0.01 uIU/mL.  D-dimer, quantitative (not at [facility])     Status: Abnormal   Collection Time: 03/13/16 10:53 PM  Result Value Ref Range   D-Dimer, Quant 0.55 (H) 0.00 - 0.50 ug/mL-FEU    Comment: (NOTE) At the manufacturer cut-off of 0.50 ug/mL FEU, this assay has been documented to exclude PE with a sensitivity and negative predictive value of 97 to 99%.  At this time, this assay has not been approved by the FDA to exclude DVT/VTE. Results should be correlated with clinical presentation.   Magnesium     Status: None   Collection Time: 03/13/16 10:53 PM  Result Value Ref Range   Magnesium 2.1 1.7 - 2.4 mg/dL   Dg Chest 2 View  Result Date: 03/13/2016 CLINICAL DATA:  Chest pain EXAM: CHEST  2 VIEW COMPARISON:  02/17/2016 FINDINGS: Cardiomediastinal silhouette is stable. Mild hyperinflation again noted. Stable left basilar scarring. Atherosclerotic calcifications of thoracic aorta. No infiltrate or pulmonary edema. Thoracic spine osteopenia IMPRESSION: No active cardiopulmonary disease.  Mild hyperinflation again noted. Electronically Signed   By: Lahoma Crocker M.D.   On: 03/13/2016 17:26   Ct Angio Chest Pe W Or Wo Contrast  Result Date: 03/14/2016 CLINICAL DATA:  82 year old female with palpitations. EXAM: CT ANGIOGRAPHY CHEST WITH CONTRAST TECHNIQUE: Multidetector CT imaging of the chest was performed using the standard protocol during bolus administration of  intravenous contrast. Multiplanar CT image reconstructions and MIPs were obtained to evaluate the vascular anatomy. CONTRAST:  100 cc Isovue 370 COMPARISON:  Chest radiograph dated 03/13/2016 FINDINGS: Evaluation is limited due to streak artifact caused by patient's arms. Cardiovascular: There is atherosclerotic calcification of the thoracic aorta. There is no aneurysmal dilatation or evidence of dissection. The origins of the great vessels of the aortic arch appear patent. Evaluation of the pulmonary arteries is limited due to respiratory motion artifact and suboptimal visualization of the peripheral branches. No definite central pulmonary artery embolus identified. Mild cardiomegaly. No pericardial effusion. Mediastinum/Nodes: No hilar or mediastinal adenopathy. The esophagus and the thyroid gland are grossly unremarkable. Lungs/Pleura: Bibasilar linear and hazy densities most compatible with atelectasis/ scarring. The lungs are otherwise clear. There is no pleural effusion or pneumothorax. The central airways are patent. Upper Abdomen: Partially visualized area of lower attenuation along the medial aspect of the right lobe of the liver (series 501, image 110) is not well evaluated and may be artifactual related to volume averaging artifact. A hepatic lesion is not excluded. This area is only partially visualized on this chest CT. CT of the abdomen pelvis with contrast may provide better evaluation of the liver. The visualized upper abdomen is otherwise unremarkable. Musculoskeletal: Osteopenia with degenerative changes of the spine. No acute fracture. Review of the MIP images confirms the above findings. IMPRESSION: No acute intrathoracic pathology. No CT evidence of central pulmonary artery embolus. Electronically Signed   By: Anner Crete M.D.   On: 03/14/2016 00:58    ECG/Tele: stable low voltage, sinus, nl axis  ROS: As above. Otherwise,  review of systems is negative unless per above HPI  Vitals:     03/13/16 2130 03/13/16 2200 03/13/16 2300 03/13/16 2330  BP: 112/59 112/62 126/57 (!) 116/53  Pulse: 63 61 63 60  Resp: _0 Temp:      TempSrc:      SpO2: 92% 93% 96% 96%  Weight:      Height:       Wt Readings from Last 10 Encounters:  03/13/16 72.6 kg (160 lb)  03/12/16 72.6 kg (160 lb)  12/31/15 74.8 kg (165 lb)  12/16/15 75.4 kg (166 lb 3.2 oz)  04/24/15 75.9 kg (167 lb 6.4 oz)  02/03/15 74 kg (163 lb 1.9 oz)  01/15/15 73.9 kg (163 lb)  09/26/14 73.1 kg (161 lb 1.3 oz)  09/13/14 72.6 kg (160 lb)  08/23/14 73 kg (161 lb)    PE:  General: No acute distress, sleeping easily arousable.  Anxious.  Tangential.  HEENT: Atraumatic, EOMI, mucous membranes moist CV: RRR no murmurs, gallops. No JVD at 45 degrees. No HJR. Respiratory: Clear, no crackles. Normal work of breathing ABD: Non-distended and non-tender. No palpable organomegaly.  Extremities: 2+ radial pulses bilaterally. 1+ left lower extremity edema. Neuro/Psych: CN grossly intact, alert and oriented  Assessment/Plan Exertional chest pain/palpitations  Chronic palpitations/symptomatic PACs/PVCs  Symptoms with both atypical and typical features.  2nd ED presentation for this.  Patient had been ordered to get outpatient nuc, but didn't make appointment. Worth getting this to evaluate her symptoms and to rule out underlying ischemia so that  flecanide can likely be reinstated.   CTA PE negative in ED (Dimer +) but shows chronic bibasilar scarring.    - OBS admission - Tele overnight - cycle troponin - 81 mg ASA - HOLD flecanide until stress test - Ischemic evaluation inpatient/outpatient pending further troponin trend and symptoms/course - resting pulse 60, defer beta blocker at this time - consider outpatient monitor to look for burden of PVCs  Lolita Cram Tambi Thole  MD 03/14/2016, 1:27 AM

## 2016-03-14 NOTE — Progress Notes (Signed)
Admitted early this morning for chest pain, dyspnea and palpitations. Tele overnight shows isolated PVC's, there is also lead artifact which triggered a tachycardia alarm. She ate breakfast this am. Plan for outpatient stress test recently, but not performed. Flecainide currently held pending stress test. Keep NPO p MN and will plan for lexiscan myoveiw tomorrow.  Pixie Casino, MD, [facility] Attending Cardiologist [place]

## 2016-03-14 NOTE — Progress Notes (Signed)
Patient received from ED and oriented to room. Was told by ED RN patient just wanted to sleep. Upon arrival, pt requested that she be allowed to sleep. Advised I would advise morning RN that admission would need to be done at that time. Tele monitoring called in. Patient advised to call prior to going to bathroom-per report she got dizzy in the ED while walking. Call light within reach.

## 2016-03-14 NOTE — Progress Notes (Signed)
Patient's 03:02 Troponins not drawn. Spoke with the lab, orders are not showing. Paged Dr Means to advise we can't do I-stat troponins. Spoke with him, he will change the order.

## 2016-03-15 ENCOUNTER — Observation Stay (HOSPITAL_COMMUNITY): Payer: Medicare Other

## 2016-03-15 ENCOUNTER — Observation Stay (HOSPITAL_BASED_OUTPATIENT_CLINIC_OR_DEPARTMENT_OTHER): Payer: Medicare Other

## 2016-03-15 ENCOUNTER — Other Ambulatory Visit: Payer: Self-pay | Admitting: Cardiology

## 2016-03-15 DIAGNOSIS — R071 Chest pain on breathing: Principal | ICD-10-CM

## 2016-03-15 DIAGNOSIS — R002 Palpitations: Secondary | ICD-10-CM | POA: Diagnosis not present

## 2016-03-15 DIAGNOSIS — R0600 Dyspnea, unspecified: Secondary | ICD-10-CM

## 2016-03-15 DIAGNOSIS — F411 Generalized anxiety disorder: Secondary | ICD-10-CM | POA: Diagnosis not present

## 2016-03-15 DIAGNOSIS — I491 Atrial premature depolarization: Secondary | ICD-10-CM | POA: Diagnosis not present

## 2016-03-15 DIAGNOSIS — R079 Chest pain, unspecified: Secondary | ICD-10-CM

## 2016-03-15 DIAGNOSIS — I493 Ventricular premature depolarization: Secondary | ICD-10-CM

## 2016-03-15 LAB — NM MYOCAR MULTI W/SPECT W/WALL MOTION / EF
Estimated workload: 1 METS
MPHR: 136 {beats}/min
Peak HR: 107 {beats}/min
Percent HR: 78 %
Rest HR: 66 {beats}/min

## 2016-03-15 MED ORDER — REGADENOSON 0.4 MG/5ML IV SOLN
INTRAVENOUS | Status: AC
  Administered 2016-03-15: 0.4 mg via INTRAVENOUS
  Filled 2016-03-15: qty 5

## 2016-03-15 MED ORDER — TECHNETIUM TC 99M TETROFOSMIN IV KIT
30.0000 | PACK | Freq: Once | INTRAVENOUS | Status: AC | PRN
Start: 2016-03-15 — End: 2016-03-15
  Administered 2016-03-15: 30 via INTRAVENOUS

## 2016-03-15 MED ORDER — LORAZEPAM 0.5 MG PO TABS: 0.5000 mg | ORAL_TABLET | Freq: Two times a day (BID) | ORAL | 0 refills | Status: DC | PRN

## 2016-03-15 MED ORDER — LORAZEPAM 0.5 MG PO TABS
0.5000 mg | ORAL_TABLET | Freq: Three times a day (TID) | ORAL | Status: DC | PRN
  Administered 2016-03-15: 0.5 mg via ORAL
  Filled 2016-03-15: qty 1

## 2016-03-15 MED ORDER — REGADENOSON 0.4 MG/5ML IV SOLN
0.4000 mg | Freq: Once | INTRAVENOUS | Status: AC
  Administered 2016-03-15: 0.4 mg via INTRAVENOUS
  Filled 2016-03-15: qty 5

## 2016-03-15 MED ORDER — TECHNETIUM TC 99M TETROFOSMIN IV KIT
10.0000 | PACK | Freq: Once | INTRAVENOUS | Status: AC | PRN
  Administered 2016-03-15: 10 via INTRAVENOUS

## 2016-03-15 MED ORDER — METOPROLOL TARTRATE 25 MG PO TABS: 12.5000 mg | ORAL_TABLET | Freq: Two times a day (BID) | ORAL | 12 refills | Status: DC

## 2016-03-15 MED ORDER — METOPROLOL TARTRATE 12.5 MG HALF TABLET
12.5000 mg | ORAL_TABLET | Freq: Two times a day (BID) | ORAL | Status: DC
  Administered 2016-03-15: 12.5 mg via ORAL
  Filled 2016-03-15: qty 1

## 2016-03-15 NOTE — Progress Notes (Signed)
Nutrition Brief Note  Patient identified on the Malnutrition Screening Tool (MST) Report.  Wt Readings from Last 15 Encounters:  03/14/16 158 lb 1.6 oz (71.7 kg)  03/12/16 160 lb (72.6 kg)  12/31/15 165 lb (74.8 kg)  12/16/15 166 lb 3.2 oz (75.4 kg)  04/24/15 167 lb 6.4 oz (75.9 kg)  02/03/15 163 lb 1.9 oz (74 kg)  01/15/15 163 lb (73.9 kg)  09/26/14 161 lb 1.3 oz (73.1 kg)  09/13/14 160 lb (72.6 kg)  08/23/14 161 lb (73 kg)  08/07/14 160 lb 6.4 oz (72.8 kg)  05/01/14 163 lb (73.9 kg)  04/11/14 161 lb 12.8 oz (73.4 kg)  02/07/14 160 lb 4 oz (72.7 kg)  01/20/14 156 lb 9.6 oz (71 kg)    Body mass index is 26.31 kg/m. Patient meets criteria for Overweight based on current BMI.   Current diet order is NPO.  Labs and medications reviewed.   No nutrition interventions warranted at this time. If nutrition issues arise, please consult RD.   Arthur Holms, RD, LDN Pager #: [phone] After-Hours Pager #: [phone]

## 2016-03-15 NOTE — Progress Notes (Signed)
Patient Name: [name] [name] Date of Encounter: 03/15/2016  Primary Cardiologist: Dr. Thayer Ohm Problem List     Principal Problem:   Chest pain Active Problems:   Anxiety state   Essential hypertension   ATRIAL PREMATURE BEATS   Palpitations     Subjective   Feels weak, no chest pain. Seen in nuclear med.   Inpatient Medications    Scheduled Meds: . aspirin  81 mg Oral Daily  . enoxaparin (LOVENOX) injection  40 mg Subcutaneous Q24H  . feeding supplement (ENSURE ENLIVE)  237 mL Oral BID BM   Continuous Infusions:   PRN Meds: acetaminophen, ondansetron (ZOFRAN) IV   Vital Signs    Vitals:   03/14/16 1451 03/14/16 1954 03/15/16 0442 03/15/16 0848  BP: 132/72 (!) 144/76 117/73 123/69  Pulse: 78 76 76   Resp: 18 20 20    Temp: 98.5 F (36.9 C) 97.8 F (36.6 C) 98.4 F (36.9 C)   TempSrc: Oral Oral Oral   SpO2: 98% 98% 95%   Weight:      Height:        Intake/Output Summary (Last 24 hours) at 03/15/16 0859 Last data filed at 03/14/16 1900  Gross per 24 hour  Intake              240 ml  Output                0 ml  Net              240 ml   Filed Weights   03/13/16 1539 03/14/16 0238  Weight: 160 lb (72.6 kg) 158 lb 1.6 oz (71.7 kg)    Physical Exam    GEN: Well nourished, well developed, in no acute distress.  HEENT: Grossly normal.  Neck: Supple, no JVD, carotid bruits, or masses. Cardiac: RRR, no murmurs, rubs, or gallops. No clubbing, cyanosis, edema.  Radials/DP/PT 2+ and equal bilaterally.  Respiratory:  Respirations regular and unlabored, clear to auscultation bilaterally. GI: Soft, nontender, nondistended, BS + x 4. MS: no deformity or atrophy. Skin: warm and dry, no rash. Neuro:  Strength and sensation are intact. Psych: AAOx3.  Normal affect.  Labs    CBC  Recent Labs  03/13/16 1609  WBC 4.4  HGB 13.6  HCT 39.9  MCV 92.4  PLT [id number]   Basic Metabolic Panel  Recent Labs  03/13/16 1609 03/13/16 2253  NA 141  --   K  3.5  --   CL 107  --   CO2 24  --   GLUCOSE 101*  --   BUN 12  --   CREATININE 0.75  --   CALCIUM 9.6  --   MG  --  2.1   Cardiac Enzymes  Recent Labs  03/14/16 0641 03/14/16 1358 03/14/16 1824  TROPONINI <0.03 <0.03 <0.03   BNP Invalid input(s): POCBNP D-Dimer  Recent Labs  03/13/16 2253  DDIMER 0.55*  nction Tests  Recent Labs  03/13/16 2253  TSH 3.987    Telemetry    NSR with PVC's - Personally Reviewed  ECG    NSR - Personally Reviewed  Radiology    Dg Chest 2 View  Result Date: 03/13/2016 CLINICAL DATA:  Chest pain EXAM: CHEST  2 VIEW COMPARISON:  02/17/2016 FINDINGS: Cardiomediastinal silhouette is stable. Mild hyperinflation again noted. Stable left basilar scarring. Atherosclerotic calcifications of thoracic aorta. No infiltrate or pulmonary edema. Thoracic spine osteopenia IMPRESSION: No active cardiopulmonary disease.  Mild hyperinflation again  noted. Electronically Signed   By: Lahoma Crocker M.D.   On: 03/13/2016 17:26   Ct Angio Chest Pe W Or Wo Contrast  Result Date: 03/14/2016 CLINICAL DATA:  82 year old female with palpitations. EXAM: CT ANGIOGRAPHY CHEST WITH CONTRAST TECHNIQUE: Multidetector CT imaging of the chest was performed using the standard protocol during bolus administration of intravenous contrast. Multiplanar CT image reconstructions and MIPs were obtained to evaluate the vascular anatomy. CONTRAST:  100 cc Isovue 370 COMPARISON:  Chest radiograph dated 03/13/2016 FINDINGS: Evaluation is limited due to streak artifact caused by patient's arms. Cardiovascular: There is atherosclerotic calcification of the thoracic aorta. There is no aneurysmal dilatation or evidence of dissection. The origins of the great vessels of the aortic arch appear patent. Evaluation of the pulmonary arteries is limited due to respiratory motion artifact and suboptimal visualization of the peripheral branches. No definite central pulmonary artery embolus identified.  Mild cardiomegaly. No pericardial effusion. Mediastinum/Nodes: No hilar or mediastinal adenopathy. The esophagus and the thyroid gland are grossly unremarkable. Lungs/Pleura: Bibasilar linear and hazy densities most compatible with atelectasis/ scarring. The lungs are otherwise clear. There is no pleural effusion or pneumothorax. The central airways are patent. Upper Abdomen: Partially visualized area of lower attenuation along the medial aspect of the right lobe of the liver (series 501, image 110) is not well evaluated and may be artifactual related to volume averaging artifact. A hepatic lesion is not excluded. This area is only partially visualized on this chest CT. CT of the abdomen pelvis with contrast may provide better evaluation of the liver. The visualized upper abdomen is otherwise unremarkable. Musculoskeletal: Osteopenia with degenerative changes of the spine. No acute fracture. Review of the MIP images confirms the above findings. IMPRESSION: No acute intrathoracic pathology. No CT evidence of central pulmonary artery embolus. Electronically Signed   By: Anner Crete M.D.   On: 03/14/2016 00:58    Cardiac Studies   Lexiscan Myoview pending  Patient Profile     82 year old female with a past medical history of SVT, PVC's on flecainide previously. Presented with chest pain and had plans for outpatient nuclear study, but never made an appt., so was planned for this admission. CTA - for PE.   Assessment & Plan    1. Chest pain: Seen in nuclear med, she is chest pain free, but continues to feel weak. Troponin negative x 3. Can feel her PVC's, flecainide on hold pending stress test.   2. PVC's: Recent Echo shows EF of 55-60%. Consider outpatient monitor to assess PVC burden. Per Dr. Tanna Furry note in 11/2015, consider adding beta blocker, would initiate while she is admitted.   Signed, Arbutus Leas, NP  03/15/2016, 8:59 AM

## 2016-03-15 NOTE — Progress Notes (Signed)
The patient was seen in nuclear medicine for a Lexiscan myoview. She tolerated the procedure well. No acute ST or TW changes on ECG.   Unice Vantassel, NP  [facility]  

## 2016-03-15 NOTE — Care Management Obs Status (Signed)
[place] NOTIFICATION   Patient Details  Name: CHARMELL ARAIZA MRN: [id number] Date of Birth: Nov 12, 1929   Medicare Observation Status Notification Given:  Yes    Dawayne Patricia, RN 03/15/2016, 4:27 PM

## 2016-03-15 NOTE — Discharge Summary (Signed)
Discharge Summary    Patient ID: [name] [name],  MRN: [id number], DOB/AGE: [birth year] 80 y.o.  Admit date: 03/13/2016 Discharge date: 03/15/2016  Primary Care Provider: Hoyt Koch Primary Cardiologist: Dr. Lovena Le   Discharge Diagnoses    Principal Problem:   Chest pain on breathing Active Problems:   Anxiety state   Essential hypertension   ATRIAL PREMATURE BEATS   Palpitations   Allergies Allergies  Allergen Reactions  . Zyrtec [Cetirizine]     Palpitations    Diagnostic Studies/Procedures  MYOCARDIAL IMAGING WITH SPECT (REST AND PHARMACOLOGIC-STRESS) 03/15/16  IMPRESSION: 1. No reversible ischemia or infarction.  2. Normal left ventricular wall motion. Hyperdynamic left ventricle.  3. Left ventricular ejection fraction 93%  4. Non invasive risk stratification*: Low  _____________   History of Present Illness   82 year old female with a history of PSVT and PAC's and HTN. She presented with 2 days of worsening SOB, and chest pain associated with palpitations.   She sees Dr. Lovena Le and there was some mention that he recommended a nuclear stress test, given her symptoms it was felt best that she be admitted and stress test could be preformed while she is admitted. Also, she takes flecainide so a stress test would be prudent.    Hospital Course     Her troponin was negative x 3. EKG showed NSR with low voltage QRS. PE was ruled out by CTA.  Lexiscan Myoview was low risk with no ischemia or infarction. She had frequent PVC's on telemetry, which she can feel causing her anxiety. She was started on metoprolol 12.5 mg BID and we can titrate up as needed outpatient.   Also will arrange for 30 day monitor to assess for PVC burden. She was seen today by Dr. Debara Pickett and deemed suitable for discharge.   _____________  Discharge Vitals Blood pressure 124/75, pulse 75, temperature 98.1 F (36.7 C), temperature source Oral, resp. rate 18, height 5\' 5"   (1.651 m), weight 158 lb 1.6 oz (71.7 kg), last menstrual period 05/24/1970, SpO2 98 %.  Filed Weights   03/13/16 1539 03/14/16 0238  Weight: 160 lb (72.6 kg) 158 lb 1.6 oz (71.7 kg)    Labs & Radiologic Studies     CBC  Recent Labs  03/13/16 1609  WBC 4.4  HGB 13.6  HCT 39.9  MCV 92.4  PLT [id number]   Basic Metabolic Panel  Recent Labs  03/13/16 1609 03/13/16 2253  NA 141  --   K 3.5  --   CL 107  --   CO2 24  --   GLUCOSE 101*  --   BUN 12  --   CREATININE 0.75  --   CALCIUM 9.6  --   MG  --  2.1   Cardiac Enzymes  Recent Labs  03/14/16 0641 03/14/16 1358 03/14/16 1824  TROPONINI <0.03 <0.03 <0.03   BNP Invalid input(s): POCBNP D-Dimer  Recent Labs  03/13/16 2253  DDIMER 0.55*   Thyroid Function Tests  Recent Labs  03/13/16 2253  TSH 3.987    Dg Chest 2 View  Result Date: 03/13/2016 CLINICAL DATA:  Chest pain EXAM: CHEST  2 VIEW COMPARISON:  02/17/2016 FINDINGS: Cardiomediastinal silhouette is stable. Mild hyperinflation again noted. Stable left basilar scarring. Atherosclerotic calcifications of thoracic aorta. No infiltrate or pulmonary edema. Thoracic spine osteopenia IMPRESSION: No active cardiopulmonary disease.  Mild hyperinflation again noted. Electronically Signed   By: Lahoma Crocker M.D.   On: 03/13/2016 17:26  Dg Chest 2 View  Result Date: 02/17/2016 CLINICAL DATA:  Shortness of breath, hypertension, history of cardiac dysrhythmias. EXAM: CHEST  2 VIEW COMPARISON:  PA and lateral chest x-ray of January 20, 2014 FINDINGS: The lungs remain hyperinflated with hemidiaphragm flattening. There is no focal infiltrate. There is no pleural effusion. The heart and pulmonary vascularity are normal. There is calcification in the wall of the thoracic aorta. The bony thorax exhibits no acute abnormality. IMPRESSION: COPD.  There is no active cardiopulmonary disease. Aortic atherosclerosis. Electronically Signed   By: David  Martinique M.D.   On: 02/17/2016 16:07     Ct Angio Chest Pe W Or Wo Contrast  Result Date: 03/14/2016 CLINICAL DATA:  82 year old female with palpitations. EXAM: CT ANGIOGRAPHY CHEST WITH CONTRAST TECHNIQUE: Multidetector CT imaging of the chest was performed using the standard protocol during bolus administration of intravenous contrast. Multiplanar CT image reconstructions and MIPs were obtained to evaluate the vascular anatomy. CONTRAST:  100 cc Isovue 370 COMPARISON:  Chest radiograph dated 03/13/2016 FINDINGS: Evaluation is limited due to streak artifact caused by patient's arms. Cardiovascular: There is atherosclerotic calcification of the thoracic aorta. There is no aneurysmal dilatation or evidence of dissection. The origins of the great vessels of the aortic arch appear patent. Evaluation of the pulmonary arteries is limited due to respiratory motion artifact and suboptimal visualization of the peripheral branches. No definite central pulmonary artery embolus identified. Mild cardiomegaly. No pericardial effusion. Mediastinum/Nodes: No hilar or mediastinal adenopathy. The esophagus and the thyroid gland are grossly unremarkable. Lungs/Pleura: Bibasilar linear and hazy densities most compatible with atelectasis/ scarring. The lungs are otherwise clear. There is no pleural effusion or pneumothorax. The central airways are patent. Upper Abdomen: Partially visualized area of lower attenuation along the medial aspect of the right lobe of the liver (series 501, image 110) is not well evaluated and may be artifactual related to volume averaging artifact. A hepatic lesion is not excluded. This area is only partially visualized on this chest CT. CT of the abdomen pelvis with contrast may provide better evaluation of the liver. The visualized upper abdomen is otherwise unremarkable. Musculoskeletal: Osteopenia with degenerative changes of the spine. No acute fracture. Review of the MIP images confirms the above findings. IMPRESSION: No acute  intrathoracic pathology. No CT evidence of central pulmonary artery embolus. Electronically Signed   By: Anner Crete M.D.   On: 03/14/2016 00:58   Nm Myocar Multi W/spect W/wall Motion / Ef  Result Date: 03/15/2016 CLINICAL DATA:  Chest pain and palpitations. EXAM: MYOCARDIAL IMAGING WITH SPECT (REST AND PHARMACOLOGIC-STRESS) GATED LEFT VENTRICULAR WALL MOTION STUDY LEFT VENTRICULAR EJECTION FRACTION TECHNIQUE: Standard myocardial SPECT imaging was performed after resting intravenous injection of 10 mCi Tc-92m tetrofosmin. Subsequently, intravenous infusion of Lexiscan was performed under the supervision of the Cardiology staff. At peak effect of the drug, 30 mCi Tc-31m tetrofosmin was injected intravenously and standard myocardial SPECT imaging was performed. Quantitative gated imaging was also performed to evaluate left ventricular wall motion, and estimate left ventricular ejection fraction. COMPARISON:  None. FINDINGS: Perfusion: No decreased activity in the left ventricle on stress imaging to suggest reversible ischemia or infarction. Wall Motion: Normal left ventricular wall motion. No left ventricular dilation. Left Ventricular Ejection Fraction: 93 % End diastolic volume 35 ml End systolic volume 2 ml IMPRESSION: 1. No reversible ischemia or infarction. 2. Normal left ventricular wall motion. Hyperdynamic left ventricle. 3. Left ventricular ejection fraction 93% 4. Non invasive risk stratification*: Low *2012 Appropriate Use Criteria for  Coronary Revascularization Focused Update: J Am Coll Cardiol. [id number]. http://content.airportbarriers.com.aspx?articleid=[email or web address] Electronically Signed   By: Marijo Sanes M.D.   On: 03/15/2016 15:16    Disposition   Pt is being discharged home today in good condition.  Follow-up Plans & Appointments    Follow-up Information    Cristopher Peru, MD Follow up on 04/26/2016.   Specialty:  Cardiology Why:  at 12:15 pm for yearly appt. and follow  up on 30 day monitor results.  Contact information: [id number] [place] [address] [phone]          Discharge Instructions    Diet - low sodium heart healthy    Complete by:  As directed    Increase activity slowly    Complete by:  As directed       Discharge Medications   Current Discharge Medication List    START taking these medications   Details  metoprolol tartrate (LOPRESSOR) 25 MG tablet Take 0.5 tablets (12.5 mg total) by mouth 2 (two) times daily. Qty: 30 tablet, Refills: 12      CONTINUE these medications which have NOT CHANGED   Details  aspirin 81 MG tablet Take 81 mg by mouth daily.    Cholecalciferol (VITAMIN D3) 1000 UNITS CAPS Take 1,000 Units by mouth daily.     LORazepam (ATIVAN) 1 MG tablet Take 0.5 tablets (0.5 mg total) by mouth daily as needed for anxiety. Qty: 30 tablet, Refills: 0      STOP taking these medications     aspirin EC 325 MG tablet      flecainide (TAMBOCOR) 50 MG tablet           Outstanding Labs/Studies     Duration of Discharge Encounter   Greater than 30 minutes including physician time.  Signed, Arbutus Leas NP 03/15/2016, 4:41 PM

## 2016-03-16 ENCOUNTER — Ambulatory Visit (INDEPENDENT_AMBULATORY_CARE_PROVIDER_SITE_OTHER): Payer: Medicare Other

## 2016-03-16 DIAGNOSIS — I493 Ventricular premature depolarization: Secondary | ICD-10-CM

## 2016-03-22 ENCOUNTER — Ambulatory Visit (INDEPENDENT_AMBULATORY_CARE_PROVIDER_SITE_OTHER): Payer: Medicare Other | Admitting: Internal Medicine

## 2016-03-22 ENCOUNTER — Encounter: Payer: Self-pay | Admitting: Internal Medicine

## 2016-03-22 VITALS — BP 142/84 | HR 74 | Ht 65.0 in | Wt 157.4 lb

## 2016-03-22 DIAGNOSIS — I208 Other forms of angina pectoris: Secondary | ICD-10-CM

## 2016-03-22 DIAGNOSIS — R002 Palpitations: Secondary | ICD-10-CM

## 2016-03-22 DIAGNOSIS — I209 Angina pectoris, unspecified: Secondary | ICD-10-CM | POA: Diagnosis not present

## 2016-03-22 MED ORDER — METOPROLOL TARTRATE 25 MG PO TABS: 12.5000 mg | ORAL_TABLET | Freq: Two times a day (BID) | ORAL | 12 refills | Status: DC

## 2016-03-22 MED ORDER — METOPROLOL TARTRATE 25 MG PO TABS: 12.5000 mg | ORAL_TABLET | Freq: Two times a day (BID) | ORAL | 3 refills | Status: DC

## 2016-03-22 NOTE — Patient Instructions (Signed)
Medication Instructions:    Your physician recommends that you continue on your current medications as directed. Please refer to the Current Medication list given to you today.  --- If you need a refill on your cardiac medications before your next appointment, please call your pharmacy. ---  Labwork:  None ordered  Testing/Procedures:  None ordered  Follow-Up:  Your physician recommends that you schedule a follow-up appointment in: 6 weeks with Dr. Lovena Le.  Thank you for choosing [facility]!!

## 2016-03-22 NOTE — Progress Notes (Signed)
HPI [name] [name] returns today for followup. She is a very pleasant 82 year old woman with a history of palpitations who has been on flecainide therapy for many years which was recently stopped. She was in the hospital with atypical chest pain and had a stress test which was negative. She has had no sustained arrhythmias. Her flecainide was stopped and she was placed on metoprolol.   Current Outpatient Prescriptions  Medication Sig Dispense Refill  . aspirin 81 MG tablet Take 81 mg by mouth daily.    . Cholecalciferol (VITAMIN D3) 1000 UNITS CAPS Take 1,000 Units by mouth daily.     . metoprolol tartrate (LOPRESSOR) 25 MG tablet Take 0.5 tablets (12.5 mg total) by mouth 2 (two) times daily. 90 tablet 3  . LORazepam (ATIVAN) 0.5 MG tablet Take 1 tablet (0.5 mg total) by mouth 2 (two) times daily as needed for anxiety. (Patient not taking: Reported on 03/22/2016) 15 tablet 0   No current facility-administered medications for this visit.      Past Medical History:  Diagnosis Date  . Anemia    iron def  . Anxiety   . Arrhythmia    paroxysmal SVT and symtomatic PAC's  . Diverticulosis    colon  . History of colonic polyps    cecal  . Hypertension   . Osteoporosis 07/21/2011   t score -2.7  . Stricture esophagus   . Vitamin D deficiency    2010    ROS:   All systems reviewed and negative except as noted in the HPI.   Past Surgical History:  Procedure Laterality Date  . A&P, enterocele repair     s/p 2003  . ABDOMINAL HYSTERECTOMY    . BREAST REDUCTION SURGERY     s/p 1996  . HEMORRHOID SURGERY       Family History  Problem Relation Age of Onset  . Stroke Father     @ 75  . Hypertension Father   . Hypertension Sister   . Cancer Sister 17    brain  . Breast cancer Other 42    breast  . Breast cancer Daughter 28  . Cancer Daughter 76    breast  . Cancer Other 83    colon/MELANOMA  . Stroke Mother     medication induced  . Kidney disease Brother      Social  History   Social History  . Marital status: Married    Spouse name: N/A  . Number of children: N/A  . Years of education: N/A   Occupational History  . Retired    Social History Main Topics  . Smoking status: Former Smoker    Quit date: 05/24/1968  . Smokeless tobacco: Never Used     Comment: only smoked 7 years  . Alcohol use No  . Drug use: No  . Sexual activity: No     Comment: 1st intercourse 46 yo-1 partner   Other Topics Concern  . Not on file   Social History Narrative  . No narrative on file     BP (!) 142/84   Pulse 74   Ht 5\' 5"  (1.651 m)   Wt 157 lb 6.4 oz (71.4 kg)   LMP 05/24/1970   BMI 26.19 kg/m   Physical Exam:  Well appearing elderly woman, NAD HEENT: Unremarkable Neck:  6 cm JVD, no thyromegally Back:  No CVA tenderness Lungs:  Clear with no wheezes, rales, or rhonchi. HEART:  Regular rate rhythm, no murmurs, no rubs, no clicks  Abd:  soft, positive bowel sounds, no organomegally, no rebound, no guarding Ext:  2 plus pulses, no edema, no cyanosis, no clubbing, right leg with minimal swelling Skin:  No rashes no nodules Neuro:  CN II through XII intact, motor grossly intact  EKG Normal sinus rhythm, normal axis and intervals.  Assess/Plan: 1. Atypical chest pain assoicated with sob, minimally symptomatic. - she is better and her stress test was negative. She will undergo watchful waiting. 2. Palpitations - she is minimally symptomatic. She will continue her metoprolol. Might consider Rhythmol in conjunction with metoprolol if she has breakthrough symptoms. 3. HTN - her blood pressure is better today on the beta blocker. She is encouraged to reduce her salt intake.  Mikle Bosworth.D.

## 2016-03-25 ENCOUNTER — Ambulatory Visit (INDEPENDENT_AMBULATORY_CARE_PROVIDER_SITE_OTHER): Payer: Medicare Other | Admitting: Internal Medicine

## 2016-03-25 ENCOUNTER — Encounter: Payer: Self-pay | Admitting: Internal Medicine

## 2016-03-25 VITALS — BP 140/82 | HR 61 | Temp 97.9°F | Resp 14 | Ht 65.0 in | Wt 158.0 lb

## 2016-03-25 DIAGNOSIS — M25511 Pain in right shoulder: Secondary | ICD-10-CM | POA: Diagnosis not present

## 2016-03-25 DIAGNOSIS — G8929 Other chronic pain: Secondary | ICD-10-CM | POA: Diagnosis not present

## 2016-03-25 DIAGNOSIS — M1710 Unilateral primary osteoarthritis, unspecified knee: Secondary | ICD-10-CM

## 2016-03-25 DIAGNOSIS — M25519 Pain in unspecified shoulder: Secondary | ICD-10-CM | POA: Insufficient documentation

## 2016-03-25 DIAGNOSIS — I208 Other forms of angina pectoris: Secondary | ICD-10-CM

## 2016-03-25 DIAGNOSIS — Z23 Encounter for immunization: Secondary | ICD-10-CM

## 2016-03-25 MED ORDER — DICLOFENAC SODIUM 1 % TD GEL: 2.0000 g | Freq: Three times a day (TID) | TRANSDERMAL | 6 refills | Status: DC | PRN

## 2016-03-25 NOTE — Progress Notes (Signed)
   Subjective:    Patient ID: [name] [name], female    DOB: [birth year], 82 y.o.   MRN: [id number]  HPI The patient is an 82 YO female coming in for pain in her knees and some mild swelling in the knees. She was seen about a week ago and she is still having the same problem. She has never had any shots in her knee or other treatment for it. She is taking otc aspirin which is helpful sometimes. She denies recent falls or injury.  She is also having some shoulder pain in her right shoulder. She had an injury about 6-7 months ago and went to urgent care or ER (she cannot remember) and was told there was no major injury. But it has never healed like it should and she is not able to use it well. She denies numbness or weakness in her arms. No pain in her neck. No new injury to the area.    Review of Systems  Constitutional: Positive for activity change. Negative for appetite change, fatigue, fever and unexpected weight change.  Respiratory: Negative.   Cardiovascular: Negative.   Gastrointestinal: Negative.   Musculoskeletal: Positive for arthralgias and joint swelling. Negative for back pain, gait problem, myalgias, neck pain and neck stiffness.  Skin: Negative.       Objective:   Physical Exam  Constitutional: She appears well-developed and well-nourished.  HENT:  Head: Normocephalic and atraumatic.  Eyes: EOM are normal.  Cardiovascular: Normal rate and regular rhythm.   Pulmonary/Chest: Effort normal. No respiratory distress. She has no wheezes.  Abdominal: Soft.  Musculoskeletal: She exhibits tenderness. She exhibits no edema.  Some mild tenderness in the knees bilaterally, ACL and PCL intact, no obvious knee effusion.   Skin: Skin is warm and dry.   Vitals:   03/25/16 1127  BP: 140/82  Pulse: 61  Resp: 14  Temp: 97.9 F (36.6 C)  TempSrc: Oral  SpO2: 98%  Weight: 158 lb (71.7 kg)  Height: 5\' 5"  (1.651 m)      Assessment & Plan:  Tdap given at visit.

## 2016-03-25 NOTE — Assessment & Plan Note (Signed)
She has not had any treatment of this. Refer to sports medicine for evaluation and possibly treatment. She is written rx for voltaren gel to use on the area today.

## 2016-03-25 NOTE — Addendum Note (Signed)
Addended by: Resa Miner R on: 03/25/2016 02:14 PM   Modules accepted: Orders

## 2016-03-25 NOTE — Assessment & Plan Note (Signed)
Likely cause of her pain, no obvious joint effusion on exam today despite her concerns of fluid. Rx for voltaren gel for pain. Refer to sports medicine for cortisone injections.

## 2016-03-25 NOTE — Patient Instructions (Signed)
We have sent in the medicine that is called voltaren gel which you can rub on the knees and the shoulder for pain up to 3 times per day.   We are also going to get you in with Dr. Tamala Julian who does sports medicine and can help with the knees and maybe the shoulder.

## 2016-03-25 NOTE — Progress Notes (Signed)
Pre visit review using our clinic review tool, if applicable. No additional management support is needed unless otherwise documented below in the visit note. 

## 2016-03-29 ENCOUNTER — Telehealth: Payer: Self-pay | Admitting: Internal Medicine

## 2016-03-29 NOTE — Telephone Encounter (Signed)
Called , spoke with pt. Pt stated she is having palpitations. BP today 173/90 and HR 78. Pt stated she is having pain in her stomach. I advised her to call her PCP to let them know. Informed I will forward to Dr. Lovena Le to advise.

## 2016-03-29 NOTE — Telephone Encounter (Signed)
Pt is calling stating she is having heart Rhythm  and wondering what she should do.

## 2016-03-30 ENCOUNTER — Telehealth: Payer: Self-pay

## 2016-03-30 DIAGNOSIS — Z79899 Other long term (current) drug therapy: Secondary | ICD-10-CM

## 2016-03-30 MED ORDER — PROPAFENONE HCL ER 225 MG PO CP12: 225.0000 mg | ORAL_CAPSULE | Freq: Two times a day (BID) | ORAL | 2 refills | Status: DC

## 2016-03-30 NOTE — Telephone Encounter (Signed)
Called, spoke with pt. Informed Dr. Lovena Le would like to add Rhythmol SR 225 mg twice daily to help with symptoms. Informed pt needs to come in to office for EKG 1 week after starting Rhythmol. Explained someone form scheduling will cal pt to set up. Informed pt to call our offcie with questions or concerns taking medications. Pt verbalized understanding and agreed with plan.

## 2016-03-31 ENCOUNTER — Other Ambulatory Visit: Payer: Self-pay | Admitting: Internal Medicine

## 2016-03-31 DIAGNOSIS — Z1231 Encounter for screening mammogram for malignant neoplasm of breast: Secondary | ICD-10-CM

## 2016-04-01 ENCOUNTER — Encounter: Payer: Self-pay | Admitting: Internal Medicine

## 2016-04-01 ENCOUNTER — Ambulatory Visit (INDEPENDENT_AMBULATORY_CARE_PROVIDER_SITE_OTHER): Payer: Medicare Other | Admitting: Internal Medicine

## 2016-04-01 VITALS — BP 130/78 | HR 68 | Temp 97.9°F | Resp 14 | Ht 65.0 in | Wt 155.0 lb

## 2016-04-01 DIAGNOSIS — R109 Unspecified abdominal pain: Secondary | ICD-10-CM | POA: Insufficient documentation

## 2016-04-01 DIAGNOSIS — I208 Other forms of angina pectoris: Secondary | ICD-10-CM

## 2016-04-01 DIAGNOSIS — K769 Liver disease, unspecified: Secondary | ICD-10-CM

## 2016-04-01 DIAGNOSIS — R1013 Epigastric pain: Secondary | ICD-10-CM

## 2016-04-01 MED ORDER — FAMOTIDINE 40 MG PO TABS: 40.0000 mg | ORAL_TABLET | Freq: Every day | ORAL | 1 refills | Status: DC

## 2016-04-01 NOTE — Progress Notes (Signed)
   Subjective:    Patient ID: [name] [name], female    DOB: [birth year], 82 y.o.   MRN: [id number]  HPI The patient is an 82 YO female coming in for stomach concerns. She has had 10 pound weight loss over the same time period. She is having about 2-3 months of abdominal fullness, bloating, epigastric pain. She is not trying to lose weight and diet is about the same. She is not able to eat as much. No fevers or chills. Recent CT angio chest with possible liver lesion which was not adequately imaged. No diarrhea or constipation or blood in stool.   Review of Systems  Constitutional: Positive for appetite change, fatigue and unexpected weight change. Negative for activity change, chills and fever.  Respiratory: Negative.   Cardiovascular: Negative.   Gastrointestinal: Positive for abdominal distention, abdominal pain and nausea. Negative for anal bleeding, blood in stool, constipation, diarrhea, rectal pain and vomiting.  Musculoskeletal: Negative.   Skin: Negative.   Neurological: Negative.       Objective:   Physical Exam  Constitutional: She is oriented to person, place, and time. She appears well-developed and well-nourished.  HENT:  Head: Normocephalic and atraumatic.  Eyes: EOM are normal.  Cardiovascular: Normal rate and regular rhythm.   Pulmonary/Chest: Effort normal and breath sounds normal. No respiratory distress. She has no wheezes. She has no rales.  Abdominal: Soft. Bowel sounds are normal. She exhibits no distension. There is tenderness. There is no rebound.  Epigastric tenderness, no obvious distention.  Musculoskeletal: She exhibits no edema.  Neurological: She is alert and oriented to person, place, and time.  Skin: Skin is warm and dry.   Vitals:   04/01/16 1328  BP: 130/78  Pulse: 68  Resp: 14  Temp: 97.9 F (36.6 C)  TempSrc: Oral  SpO2: 96%  Weight: 155 lb (70.3 kg)  Height: 5\' 5"  (1.651 m)      Assessment & Plan:

## 2016-04-01 NOTE — Assessment & Plan Note (Signed)
CT abdomen and pelvis with contrast given the weight loss and new GI symptoms and possible liver lesion on the CT angio chest 2 weeks ago. 10 pound weight loss is concerning. Last colonoscopy long ago and without polyps per her memory. Other appropriate cancer screening up to date.

## 2016-04-01 NOTE — Progress Notes (Signed)
Pre visit review using our clinic review tool, if applicable. No additional management support is needed unless otherwise documented below in the visit note. 

## 2016-04-01 NOTE — Patient Instructions (Signed)
We have sent in pepcid (famotidine) to take daily for the stomach.   We are getting the scan of the stomach and depending on the results may have you see the stomach or GI specialist.

## 2016-04-05 ENCOUNTER — Ambulatory Visit: Payer: Medicare Other | Admitting: Family Medicine

## 2016-04-05 ENCOUNTER — Telehealth: Payer: Self-pay | Admitting: Internal Medicine

## 2016-04-05 ENCOUNTER — Ambulatory Visit (INDEPENDENT_AMBULATORY_CARE_PROVIDER_SITE_OTHER): Payer: Medicare Other | Admitting: Physician Assistant

## 2016-04-05 VITALS — BP 134/76 | HR 93 | Ht 65.0 in | Wt 151.0 lb

## 2016-04-05 DIAGNOSIS — I491 Atrial premature depolarization: Secondary | ICD-10-CM | POA: Diagnosis not present

## 2016-04-05 DIAGNOSIS — I493 Ventricular premature depolarization: Secondary | ICD-10-CM

## 2016-04-05 DIAGNOSIS — R002 Palpitations: Secondary | ICD-10-CM | POA: Diagnosis not present

## 2016-04-05 DIAGNOSIS — I208 Other forms of angina pectoris: Secondary | ICD-10-CM

## 2016-04-05 MED ORDER — FLECAINIDE ACETATE 50 MG PO TABS: 50.0000 mg | ORAL_TABLET | Freq: Two times a day (BID) | ORAL | 6 refills | Status: DC

## 2016-04-05 NOTE — Telephone Encounter (Signed)
New message   Patient c/o Palpitations:  High priority if patient c/o lightheadedness and shortness of breath.  1. How long have you been having palpitations? 4 days 2. Are you currently experiencing lightheadedness and shortness of breath? no 3. Have you checked your BP and heart rate? (document readings)  no  4. Are you experiencing any other symptoms? No  Pt wants sooner appt offered tomorrow with Renae she declined ans said she wants appt today

## 2016-04-05 NOTE — Telephone Encounter (Signed)
Patient called stating she will not be able to make it to her appt today by 1445. She states she can be here by 1515. Her ride is on the way to get her. FYI.

## 2016-04-05 NOTE — Progress Notes (Signed)
Cardiology Office Note Date:  04/05/2016  Patient ID:  [name] [name], DOB 04-29-1930, MRN [id number] PCP:  Hoyt Koch, MD Electrophysiologist: Dr. Lovena Le                                                                                                      Chief Complaint: palpitations, abdominal discomfort  History of Present Illness: AMAURA [name] is a 82 y.o. female with history of palpitations (PSVT is mention in her notes, though Dr. Tanna Furry note state no sustained arrhythmias and APC's treated with Flecainide), HTN, she comes in today to be seen for Dr. Lovena Le, last seen by him 03/22/16, she had been in the hospital with c/o SOB/CP, her flecainide held given the CP, during that stay she had a negative stress test and ultimately sent home on Metoprolol tartrate.  She last saw Dr. Lovena Le after that on 03/22/16, at that time, feeling fairly well without recurrent CP and minimal palpitations, mentioned starting Rythmol if increased palpitations.  She called c/o palpitations was started on an EM, and Rythmol ordered but she has not gotten it yet and not started.  She feels very strongly that while the flecainide seemed to be making her feel SOB taking 100mg  AM and 50mg  PM, she feels much worse now.  While on the EKG monitor she points out in correlation with PVC's, these absoultely replicate the abdominal symptoms.  She says some she felt in her chest that were not bothersome, others she felt in her epigastrum.  It is these ones that make her feel sick to her stomach and unable to eat, especially when they are more frequent, stating when she eats they are much worse and in the last weeks or 2, has barely been able to eat.  I have reviewed in detail her available EM tracings so far, she has had a rare PAC and some infrequent PVCs,  No ongoing or persistent PVCs', no arrhythmias.  She has not had any further CP, no SOB, no near syncope or syncope.  I do not though historically she has  called/visited with similar symptoms, though today she is clear and certain that the metoprolol has made her feel awful and felt the best with the Flecainide.  I notice she was Rx Voltaren though she states not yet using this, has not gotten it yet either.  After much discussion and her exam, the PVC's are what she is feeling.  Past Medical History:  Diagnosis Date  . Anemia    iron def  . Anxiety   . Arrhythmia    paroxysmal SVT and symtomatic PAC's  . Diverticulosis    colon  . History of colonic polyps    cecal  . Hypertension   . Osteoporosis 07/21/2011   t score -2.7  . Stricture esophagus   . Vitamin D deficiency    2010    Past Surgical History:  Procedure Laterality Date  . A&P, enterocele repair     s/p 2003  . ABDOMINAL HYSTERECTOMY    . BREAST REDUCTION SURGERY  s/p 1996  . HEMORRHOID SURGERY      Current Outpatient Prescriptions  Medication Sig Dispense Refill  . aspirin 81 MG tablet Take 81 mg by mouth daily.    . Cholecalciferol (VITAMIN D3) 1000 UNITS CAPS Take 1,000 Units by mouth daily.     . diclofenac sodium (VOLTAREN) 1 % GEL Apply 2 g topically 3 (three) times daily as needed (pain). 100 g 6  . famotidine (PEPCID) 40 MG tablet Take 1 tablet (40 mg total) by mouth daily. 30 tablet 1  . LORazepam (ATIVAN) 0.5 MG tablet Take 1 tablet (0.5 mg total) by mouth 2 (two) times daily as needed for anxiety. 15 tablet 0  . metoprolol tartrate (LOPRESSOR) 25 MG tablet Take 0.5 tablets (12.5 mg total) by mouth 2 (two) times daily. 90 tablet 3  . propafenone (RYTHMOL SR) 225 MG 12 hr capsule Take 1 capsule (225 mg total) by mouth 2 (two) times daily. 180 capsule 2   No current facility-administered medications for this visit.     Allergies:   Zyrtec [cetirizine]   Social History:  The patient  reports that she quit smoking about 47 years ago. She has never used smokeless tobacco. She reports that she does not drink alcohol or use drugs.   Family History:   The patient's family history includes Breast cancer (age of onset: 84) in her other; Breast cancer (age of onset: 59) in her daughter; Cancer (age of onset: 36) in her other; Cancer (age of onset: 67) in her daughter; Cancer (age of onset: 2) in her sister; Hypertension in her father and sister; Kidney disease in her brother; Stroke in her father and mother.  ROS:  Please see the history of present illness.  All other systems are reviewed and otherwise negative.   PHYSICAL EXAM:  VS:  BP 134/76   Pulse 93   Ht 5\' 5"  (1.651 m)   Wt 151 lb (68.5 kg)   LMP 05/24/1970   BMI 25.13 kg/m  BMI: Body mass index is 25.13 kg/m. Well nourished, well developed, in no acute distress  HEENT: normocephalic, atraumatic  Neck: no JVD, carotid bruits or masses Cardiac:  RRR, listening at length, extrasystoles are appreciated, (and she feels them); no significant murmurs, no rubs, or gallops Lungs:  clear to auscultation bilaterally, no wheezing, rhonchi or rales  Abd: soft, nontender,  + BS x4 quadrants MS: no deformity or atrophy Ext: no edema  Skin: warm and dry, no rash Neuro:  No gross deficits appreciated Psych: euthymic mood, full affect  EKG:  Done today and reviewed by myself shows SR, QRS 48ms, PR 137ms, QTc 447 ms  03/15/16: pharmacological stress myoview IMPRESSION: 1. No reversible ischemia or infarction. 2. Normal left ventricular wall motion. Hyperdynamic left ventricle. 3. Left ventricular ejection fraction 93% 4. Non invasive risk stratification*: Low  12/26/15: TTE Study Conclusions - Left ventricle: The cavity size was normal. Wall thickness was   normal. Systolic function was normal. The estimated ejection   fraction was in the range of 55% to 60%. Wall motion was normal;   there were no regional wall motion abnormalities. Doppler   parameters are consistent with abnormal left ventricular   relaxation (grade 1 diastolic dysfunction). - Aortic valve: Trileaflet; mildly  thickened, mildly calcified   leaflets. - Mitral valve: There was trivial regurgitation. - Right ventricle: The cavity size was mildly dilated. Wall   thickness was normal. - Tricuspid valve: There was trivial regurgitation.  Recent Labs: 03/13/2016: BUN  12; Creatinine, Ser 0.75; Hemoglobin 13.6; Magnesium 2.1; Platelets 295; Potassium 3.5; Sodium 141; TSH 3.987  No results found for requested labs within last 8760 hours.   CrCl cannot be calculated (Patient's most recent lab result is older than the maximum 21 days allowed.).   Wt Readings from Last 3 Encounters:  04/05/16 151 lb (68.5 kg)  04/01/16 155 lb (70.3 kg)  03/25/16 158 lb (71.7 kg)     Other studies reviewed: Additional studies/records reviewed today include: summarized above  ASSESSMENT AND PLAN:  1. Palpitations, PVCs     Her stress test was negative, and she felt the best on Flecainide alone historically and would like to be tried back on it     No evidence of conduction disease, and no hx of AF  Start back Flecainde at 50mg  BID, stop the metoprolol She will pursue the GI w/u planned by her PMD Should she feel SOB or developed any other or worsening symptoms to notify us or seek attention if needed. She is instructed not to take the Rythmol should it come in the mail  I will see her back Wed to check EKG, re-assess her symptoms.  Haywood Lasso, PA-C 04/05/2016 4:51 PM     [facility] [address] [place] [address] [phone] (office)  [phone] (fax)

## 2016-04-05 NOTE — Patient Instructions (Addendum)
Medication Instructions:   START TAKING FLECAINIDE 50 MG TWICE A DAY   STOP TAKING  METOPROLOL   *DO NOT START TAKING RYTHMOL*  If you need a refill on your cardiac medications before your next appointment, please call your pharmacy.  Labwork: NONE ORDERED  TODAY    Testing/Procedures:  NONE ORDERED  TODAY    Follow-Up:  RETURN ON WED 04/07/16 WITH RENEE URSUY   Any Other Special Instructions Will Be Listed Below (If Applicable).

## 2016-04-05 NOTE — Telephone Encounter (Signed)
[name] [name] is asking that a nurse please give her a call . Thanks

## 2016-04-05 NOTE — Telephone Encounter (Signed)
PROVIDER WAS MADE AWARE OF LATE ARRIVAL

## 2016-04-05 NOTE — Telephone Encounter (Signed)
Patient called to c/o ABD pain, palpitations, and racing heart over the weekend.  She seems to blame her ABD discomfort on her new medication (she cannot decide if it is the Rythmol or Lopressor). She also states the ABD pain is worse when her heart is out of rhythm.  She states she is having palpitations now, but they "aren't bad." She states her wrist band says her HR is 93-95 "but it is probably wrong." She states this weekend she could "feel her heart beating in her stomach and it hurt really bad." She reports she has lost her appetite because the palpitations and stomach discomfort has been happening so often. She states she took 1/2 tab Lorazepam a few minutes ago, and this usually makes her feel a little better.  Patient requests to come in for evaluation TODAY. Scheduled patient at 1500 with Tommye Standard.  She understands to call 911 if symptoms worsen in the meantime.

## 2016-04-06 ENCOUNTER — Ambulatory Visit
Admission: RE | Admit: 2016-04-06 | Discharge: 2016-04-06 | Disposition: A | Discharge status: Home / Self Care | Payer: Medicare Other | Source: Ambulatory Visit | Attending: Internal Medicine | Admitting: Internal Medicine

## 2016-04-06 DIAGNOSIS — Z1231 Encounter for screening mammogram for malignant neoplasm of breast: Secondary | ICD-10-CM

## 2016-04-06 NOTE — Progress Notes (Signed)
Cardiology Office Note Date:  04/07/2016  Patient ID:  [name] [name], DOB 02-12-1930, MRN [id number] PCP:  Hoyt Koch, MD Electrophysiologist: Dr. Lovena Le                                                                                                      Chief Complaint: palpitations, abdominal discomfort  History of Present Illness: [name] [name] is a 82 y.o. female with history of palpitations (PSVT is mention in her notes, though Dr. Tanna Furry note state no sustained arrhythmias and APC's treated with Flecainide), HTN, she comes in today to be seen for Dr. Lovena Le, last seen by him 03/22/16, she had been in the hospital with c/o SOB/CP, her flecainide held given the CP, during that stay she had a negative stress test and ultimately sent home on Metoprolol tartrate.  She last saw Dr. Lovena Le after that on 03/22/16, at that time, feeling fairly well without recurrent CP and minimal palpitations, mentioned starting Rythmol if increased palpitations.  She called Monday and seen as an add by myself on with c/o palpitations was started on an EM, and Rythmol ordered but she has not gotten it yet and not started.  She feels very strongly that while the flecainide seemed to be making her feel SOB taking 100mg  AM and 50mg  PM, she feels much worse now.  While on the EKG monitor she points out in correlation with PVC's, these absoultely replicate the abdominal symptoms.  She says some she felt in her chest that were not bothersome, others she felt in her epigastrum.  It is these ones that make her feel sick to her stomach and unable to eat, especially when they are more frequent, stating when she eats they are much worse and in the last weeks or 2, has barely been able to eat.  I have reviewed in detail her available EM tracings so far, she has had a rare PAC and some infrequent PVCs,  No ongoing or persistent PVCs', no arrhythmias.  She has not had any further CP, no SOB, no near syncope or syncope.  I  did note though historically she has called/visited with similar symptoms, though today she is clear and certain that the metoprolol has made her feel awful and felt the best with the Flecainide.  I notice she was Rx Voltaren though she states not yet using this, has not gotten it yet either. After much discussion and her exam, the PVC's are what she is feeling and her metoprolol was stopped and resumed on Flecainide 50mg  BID (given instruction not to start the Rythmol should it arrive in the mail).  She returns today and reports today feeling 100% better.  She was able to eat a small breakfast and light dinner last night has not had any palpitations.  She is getting skin irritation at the RL,LL leads only with the EM, none on the other leads.  No CP or SOB, no dizziness, near syncope or syncope.    Past Medical History:  Diagnosis Date  . Anemia    iron def  .  Anxiety   . Arrhythmia    paroxysmal SVT and symtomatic PAC's  . Diverticulosis    colon  . History of colonic polyps    cecal  . Hypertension   . Osteoporosis 07/21/2011   t score -2.7  . Stricture esophagus   . Vitamin D deficiency    2010    Past Surgical History:  Procedure Laterality Date  . A&P, enterocele repair     s/p 2003  . ABDOMINAL HYSTERECTOMY    . BREAST REDUCTION SURGERY     s/p 1996  . HEMORRHOID SURGERY      Current Outpatient Prescriptions  Medication Sig Dispense Refill  . aspirin 81 MG tablet Take 81 mg by mouth daily.    . Cholecalciferol (VITAMIN D3) 1000 UNITS CAPS Take 1,000 Units by mouth daily.     . diclofenac sodium (VOLTAREN) 1 % GEL Apply 2 g topically 3 (three) times daily as needed (pain). 100 g 6  . flecainide (TAMBOCOR) 50 MG tablet Take 1 tablet (50 mg total) by mouth 2 (two) times daily. 60 tablet 6  . LORazepam (ATIVAN) 0.5 MG tablet Take 1 tablet (0.5 mg total) by mouth 2 (two) times daily as needed for anxiety. 15 tablet 0  . famotidine (PEPCID) 40 MG tablet Take 1 tablet (40 mg  total) by mouth daily. (Patient not taking: Reported on 04/07/2016) 30 tablet 1   No current facility-administered medications for this visit.     Allergies:   Zyrtec [cetirizine]   Social History:  The patient  reports that she quit smoking about 47 years ago. She has never used smokeless tobacco. She reports that she does not drink alcohol or use drugs.   Family History:  The patient's family history includes Breast cancer (age of onset: 62) in her other; Breast cancer (age of onset: 63) in her daughter; Cancer (age of onset: 6) in her other; Cancer (age of onset: 57) in her daughter; Cancer (age of onset: 51) in her sister; Hypertension in her father and sister; Kidney disease in her brother; Stroke in her father and mother.  ROS:  Please see the history of present illness.  All other systems are reviewed and otherwise negative.   PHYSICAL EXAM:  VS:  BP 124/82   Pulse 80   Ht 5\' 5"  (1.651 m)   Wt 154 lb (69.9 kg)   LMP 05/24/1970   BMI 25.63 kg/m  BMI: Body mass index is 25.63 kg/m. Well nourished, well developed, in no acute distress  HEENT: normocephalic, atraumatic  Neck: no JVD, carotid bruits or masses Cardiac: RRR; no significant murmurs, no rubs, or gallops Lungs:  clear to auscultation bilaterally, no wheezing, rhonchi or rales  Abd: soft, nontender MS: no deformity or atrophy Ext: no edema  Skin: warm and dry, she has erythematous rash areas where the lower leads are placed, no open wounds.   none on the others  Neuro:  No gross deficits appreciated Psych: euthymic mood, full affect  EKG:  Done  Today and reviewed by myself is SR, QRS 57ms, PR 184ms, QTc 464ms  03/15/16: pharmacological stress myoview IMPRESSION: 1. No reversible ischemia or infarction. 2. Normal left ventricular wall motion. Hyperdynamic left ventricle. 3. Left ventricular ejection fraction 93% 4. Non invasive risk stratification*: Low  12/26/15: TTE Study Conclusions - Left ventricle: The  cavity size was normal. Wall thickness was   normal. Systolic function was normal. The estimated ejection   fraction was in the range of 55% to  60%. Wall motion was normal;   there were no regional wall motion abnormalities. Doppler   parameters are consistent with abnormal left ventricular   relaxation (grade 1 diastolic dysfunction). - Aortic valve: Trileaflet; mildly thickened, mildly calcified   leaflets. - Mitral valve: There was trivial regurgitation. - Right ventricle: The cavity size was mildly dilated. Wall   thickness was normal. - Tricuspid valve: There was trivial regurgitation.  Recent Labs: 03/13/2016: BUN 12; Creatinine, Ser 0.75; Hemoglobin 13.6; Magnesium 2.1; Platelets 295; Potassium 3.5; Sodium 141; TSH 3.987  No results found for requested labs within last 8760 hours.   CrCl cannot be calculated (Patient's most recent lab result is older than the maximum 21 days allowed.).   Wt Readings from Last 3 Encounters:  04/07/16 154 lb (69.9 kg)  04/05/16 151 lb (68.5 kg)  04/01/16 155 lb (70.3 kg)     Other studies reviewed: Additional studies/records reviewed today include: summarized above  ASSESSMENT AND PLAN:  1. Palpitations, PVCs, hx of PACs     Resolved symptoms, back on Flecainide, no SOB     EKG stable today  Will check an EKG next week.  She is advised to give he skin a break fir a couple hours and move the leads inferiorly away from under her breasts.  Would be Ok to use topical OTC hydrocortisone cream on the irritated skin as bottle directions until cleared.  She is also instructed to call the monitoring company to inquire about sensitive skin option leads.  Will see her back in 6 months otherwise, sooner if needed.       Haywood Lasso, PA-C 04/07/2016 11:24 AM     [place] [address] [phone] (office)  [phone] (fax)

## 2016-04-07 ENCOUNTER — Telehealth: Payer: Self-pay | Admitting: Internal Medicine

## 2016-04-07 ENCOUNTER — Ambulatory Visit (INDEPENDENT_AMBULATORY_CARE_PROVIDER_SITE_OTHER): Payer: Medicare Other | Admitting: Physician Assistant

## 2016-04-07 VITALS — BP 124/82 | HR 80 | Ht 65.0 in | Wt 154.0 lb

## 2016-04-07 DIAGNOSIS — I208 Other forms of angina pectoris: Secondary | ICD-10-CM

## 2016-04-07 DIAGNOSIS — I493 Ventricular premature depolarization: Secondary | ICD-10-CM

## 2016-04-07 DIAGNOSIS — R002 Palpitations: Secondary | ICD-10-CM | POA: Diagnosis not present

## 2016-04-07 DIAGNOSIS — Z5181 Encounter for therapeutic drug level monitoring: Secondary | ICD-10-CM | POA: Diagnosis not present

## 2016-04-07 NOTE — Telephone Encounter (Signed)
Both tests would have contrast. If she cannot drink the contrast they can do with just the IV dye but the CT is the better test to see the stomach.

## 2016-04-07 NOTE — Progress Notes (Signed)
[facility] [place], [place] [address] Phone: [phone] Subjective:    I'm seeing this patient by the request  of:  Hoyt Koch, MD   [id number] Shoulder pain  [id number]  [name] [name] is a 82 y.o. female coming in with complaint of right shoulder pain. Patient has had right shoulder pain for quite some time. Describes it as a dull, throbbing aching sensation. Patient states that there was an audible pop multiple months ago. Patient states over the course last 6 months and has come to come and gone. Having worsening pain recently. Waking her up at night, affecting some daily activities such as dressing. States that there is sometimes audible noises that seemed to give it some difficulty and pain. Mild radiation down the arm. Rates the severity of pain a 7 out of 10 but can have days where she has no pain at all. Continues to be active and wants to continue to do so.     Past Medical History:  Diagnosis Date  . Anemia    iron def  . Anxiety   . Arrhythmia    paroxysmal SVT and symtomatic PAC's  . Diverticulosis    colon  . History of colonic polyps    cecal  . Hypertension   . Osteoporosis 07/21/2011   t score -2.7  . Stricture esophagus   . Vitamin D deficiency    2010   Past Surgical History:  Procedure Laterality Date  . A&P, enterocele repair     s/p 2003  . ABDOMINAL HYSTERECTOMY    . BREAST REDUCTION SURGERY     s/p 1996  . HEMORRHOID SURGERY     Social History   Social History  . Marital status: Married    Spouse name: N/A  . Number of children: N/A  . Years of education: N/A   Occupational History  . Retired    Social History Main Topics  . Smoking status: Former Smoker    Quit date: 05/24/1968  . Smokeless tobacco: Never Used     Comment: only smoked 7 years  . Alcohol use No  . Drug use: No  . Sexual activity: No     Comment: 1st intercourse 11 yo-1 partner   Other Topics Concern  . None    Social History Narrative  . None   Allergies  Allergen Reactions  . Zyrtec [Cetirizine]     Palpitations   Family History  Problem Relation Age of Onset  . Stroke Father     @ 22  . Hypertension Father   . Hypertension Sister   . Cancer Sister 6    brain  . Breast cancer Other 42    breast  . Breast cancer Daughter 51  . Cancer Daughter 23    breast  . Cancer Other 89    colon/MELANOMA  . Stroke Mother     medication induced  . Kidney disease Brother     Past medical history, social, surgical and family history all reviewed in electronic medical record.  No pertanent information unless stated regarding to the chief complaint.   Review of Systems:Review of systems updated and as accurate as of 04/08/16  No headache, visual changes, nausea, vomiting, diarrhea, constipation, dizziness, abdominal pain, skin rash, fevers, chills, night sweats, weight loss, swollen lymph nodes,  chest pain, shortness of breath, mood changes.   Objective  Blood pressure 124/80, pulse 68, height 5\' 5"  (1.651 m), weight 153 lb (69.4  kg), last menstrual period 05/24/1970, SpO2 97 %. Systems examined below as of 04/08/16   General: No apparent distress alert and oriented x2 mood and affect normal, dressed appropriately.  HEENT: Pupils equal, extraocular movements intact  Respiratory: Patient's speak in full sentences and does not appear short of breath  Cardiovascular: No lower extremity edema, non tender, no erythema  Skin: Warm dry intact with no signs of infection or rash on extremities or on axial skeleton.  Abdomen: Soft nontender  Neuro: Cranial nerves II through XII are intact, neurovascularly intact in all extremities with 2+ DTRs and 2+ pulses.  Lymph: No lymphadenopathy of posterior or anterior cervical chain or axillae bilaterally.  Gait normal with good balance and coordination.  MSK:  Non tender with full range of motion and good stability and symmetric strength and tone of   elbows, wrist, hip, knee and ankles bilaterally. Severe arthritic changes of multiple joints Right shoulder: Gen. appearance the patient shoulder does show atrophy but this seems to be symmetric to bilateral. Palpation is normal with no tenderness over AC joint or bicipital groove. ROM is full in all planes passively. Rotator cuff strength 4 out of 5 compared to the contralateral side signs of impingement with positive Neer and Hawkin's tests, but negative empty can sign. Speeds and Yergason's tests normal. Normal scapular function observed. Positive painful arc Contralateral shoulder has atrophy of the musculature with mild crepitus but not as severe as the other side.  MSK US performed of: Right This study was ordered, performed, and interpreted by Charlann Boxer D.O.  Shoulder:   Supraspinatus: Degenerative tear noted. Significant narrowing secondary to glenohumeral arthritis atrophy with fatty deposits noted Subscapularis:  Degenerative tearing noted Teres Minor:  Appears normal on long and transverse views. AC joint:  Moderate arthritis Glenohumeral Joint:  Moderate to severe arthritis Biceps Tendon:  Appears normal on long and transverse views, no fraying of tendon, tendon located in intertubercular groove, no subluxation with shoulder internal or external rotation.  Impression: Severe rotator cuff arthropathy  Procedure: Real-time Ultrasound Guided Injection of right glenohumeral joint Device: GE Logiq E  Ultrasound guided injection is preferred based studies that show increased duration, increased effect, greater accuracy, decreased procedural pain, increased response rate with ultrasound guided versus blind injection.  Verbal informed consent obtained.  Time-out conducted.  Noted no overlying erythema, induration, or other signs of local infection.  Skin prepped in a sterile fashion.  Local anesthesia: Topical Ethyl chloride.  With sterile technique and under real time  ultrasound guidance:  Joint visualized.  23g 1  inch needle inserted posterior approach. Pictures taken for needle placement. Patient did have injection of 2 cc of 1% lidocaine, 2 cc of 0.5% Marcaine, and 1.0 cc of Kenalog 40 mg/dL. Completed without difficulty  Pain immediately resolved suggesting accurate placement of the medication.  Advised to call if fevers/chills, erythema, induration, drainage, or persistent bleeding.  Images permanently stored and available for review in the ultrasound unit.  Impression: Technically successful ultrasound guided injection.     Impression and Recommendations:     This case required medical decision making of moderate complexity.      Note: This dictation was prepared with Dragon dictation along with smaller phrase technology. Any transcriptional errors that result from this process are unintentional.

## 2016-04-07 NOTE — Telephone Encounter (Signed)
Miriam from [facility] called in and said that pt is requesting to have a MRI and not a CT.  She has a weak stomach and can not drink the contrast.  Please contact her at [phone]

## 2016-04-07 NOTE — Patient Instructions (Signed)
Medication Instructions:   Your physician recommends that you continue on your current medications as directed. Please refer to the Current Medication list given to you today.   If you need a refill on your cardiac medications before your next appointment, please call your pharmacy.  Labwork: NONE ORDERED  TODAY    Testing/Procedures: NONE ORDERED  TODAY    Follow-Up:  NURSE VISIT NEXT WEEK  FOR EKG/MEDICATION MONITORING (FLECAINIDE)  Your physician wants you to follow-up in:  IN  [name] [name] will receive a reminder letter in the mail two months in advance. If you don't receive a letter, please call our office to schedule the follow-up appointment.       Any Other Special Instructions Will Be Listed Below (If Applicable).

## 2016-04-07 NOTE — Telephone Encounter (Signed)
Called and spoke to [place] and informed her that Dr. Sharlet Salina wants the CT done. Patient may have just the IV dye if she cannot drink the contrast.

## 2016-04-08 ENCOUNTER — Encounter: Payer: Self-pay | Admitting: Family Medicine

## 2016-04-08 ENCOUNTER — Ambulatory Visit (INDEPENDENT_AMBULATORY_CARE_PROVIDER_SITE_OTHER): Payer: Medicare Other | Admitting: Family Medicine

## 2016-04-08 ENCOUNTER — Ambulatory Visit: Payer: Self-pay

## 2016-04-08 DIAGNOSIS — M12811 Other specific arthropathies, not elsewhere classified, right shoulder: Secondary | ICD-10-CM | POA: Insufficient documentation

## 2016-04-08 DIAGNOSIS — I208 Other forms of angina pectoris: Secondary | ICD-10-CM | POA: Diagnosis not present

## 2016-04-08 HISTORY — DX: Other specific arthropathies, not elsewhere classified, right shoulder: M12.811

## 2016-04-08 NOTE — Patient Instructions (Addendum)
Good to see you  [name] [name] is your friend. Ice 20 minutes 2 times daily. Usually after activity and before bed. Exercises 3 times a week.  Try the voltaren on your shoulder as well if you need a little more pain relief.  Increase your vitamin D to 2000 IU daily  Can repeat injection every 3-4 months.  See me again in 6-12 weeks.

## 2016-04-08 NOTE — Assessment & Plan Note (Signed)
Patient given injection today. Patient's past medical history will be concerning for any significant medications. At this point patient will try this as well as some over-the-counter natural supplements. Patient does have some topical anti-inflammatory that really encourage her to try on the shoulder. We discussed icing regimen, discussed operative lifting mechanics. Patient will come back and see me again in 6-8 weeks. Patient knows we can repeat injection every 3-4 months.

## 2016-04-09 ENCOUNTER — Other Ambulatory Visit: Payer: Medicare Other

## 2016-04-14 ENCOUNTER — Ambulatory Visit (INDEPENDENT_AMBULATORY_CARE_PROVIDER_SITE_OTHER): Payer: Medicare Other | Admitting: *Deleted

## 2016-04-14 DIAGNOSIS — R002 Palpitations: Secondary | ICD-10-CM

## 2016-04-14 NOTE — Progress Notes (Signed)
Patient came to office for EKG per last office visit. Result shown to Dr. Johnsie Cancel of NSR & HR 67. No changes to medical therapy.

## 2016-04-14 NOTE — Patient Instructions (Signed)
No changes

## 2016-04-20 ENCOUNTER — Ambulatory Visit
Admission: RE | Admit: 2016-04-20 | Discharge: 2016-04-20 | Disposition: A | Discharge status: Home / Self Care | Payer: Medicare Other | Source: Ambulatory Visit | Attending: Internal Medicine | Admitting: Internal Medicine

## 2016-04-20 DIAGNOSIS — K5730 Diverticulosis of large intestine without perforation or abscess without bleeding: Secondary | ICD-10-CM | POA: Diagnosis not present

## 2016-04-20 DIAGNOSIS — R1013 Epigastric pain: Secondary | ICD-10-CM

## 2016-04-20 DIAGNOSIS — K769 Liver disease, unspecified: Secondary | ICD-10-CM

## 2016-04-20 MED ORDER — IOPAMIDOL (ISOVUE-300) INJECTION 61%
100.0000 mL | Freq: Once | INTRAVENOUS | Status: AC | PRN
  Administered 2016-04-20: 100 mL via INTRAVENOUS

## 2016-04-26 ENCOUNTER — Ambulatory Visit: Payer: Medicare Other | Admitting: Internal Medicine

## 2016-05-03 ENCOUNTER — Telehealth: Payer: Self-pay | Admitting: Emergency Medicine

## 2016-05-03 DIAGNOSIS — R1013 Epigastric pain: Principal | ICD-10-CM

## 2016-05-03 DIAGNOSIS — G8929 Other chronic pain: Secondary | ICD-10-CM

## 2016-05-03 NOTE — Telephone Encounter (Signed)
Patient called and asked that you give her a call back about some advise on an appointment. Please advise thanks.

## 2016-05-03 NOTE — Telephone Encounter (Signed)
Patient had a CT scan of the liver. No problems were found as a cause for the stomach problems. She said that you told her at the last office visit that if the CT came back normal you would refer her to see a specialist. Please advise, thanks.

## 2016-05-03 NOTE — Telephone Encounter (Signed)
Have placed the referral.

## 2016-05-03 NOTE — Telephone Encounter (Signed)
Patient aware.

## 2016-05-05 ENCOUNTER — Encounter: Payer: Self-pay | Admitting: Internal Medicine

## 2016-05-11 ENCOUNTER — Encounter: Payer: Self-pay | Admitting: Internal Medicine

## 2016-05-11 ENCOUNTER — Ambulatory Visit (INDEPENDENT_AMBULATORY_CARE_PROVIDER_SITE_OTHER): Payer: Medicare Other | Admitting: Internal Medicine

## 2016-05-11 VITALS — BP 140/82 | HR 76 | Ht 65.0 in | Wt 150.8 lb

## 2016-05-11 DIAGNOSIS — R002 Palpitations: Secondary | ICD-10-CM

## 2016-05-11 DIAGNOSIS — I208 Other forms of angina pectoris: Secondary | ICD-10-CM

## 2016-05-11 MED ORDER — FLECAINIDE ACETATE 50 MG PO TABS: 50.0000 mg | ORAL_TABLET | Freq: Two times a day (BID) | ORAL | 3 refills | Status: DC

## 2016-05-11 NOTE — Progress Notes (Signed)
HPI [name] [name] returns today for followup. She is a very pleasant 82 year old woman with a history of palpitations who has been on flecainide therapy for many years which was recently stopped. She was in the hospital with atypical chest pain and had a stress test which was negative. She has had no sustained arrhythmias. Her flecainide was stopped and then restarted. She is better. She has known PAC's and PVC's and is very cardiac aware. On medication, her symptoms have been nicely controlled.  Current Outpatient Prescriptions  Medication Sig Dispense Refill  . aspirin 81 MG tablet Take 81 mg by mouth daily.    . Cholecalciferol (VITAMIN D3) 1000 UNITS CAPS Take 1,000 Units by mouth daily.     . flecainide (TAMBOCOR) 50 MG tablet Take 1 tablet (50 mg total) by mouth 2 (two) times daily. 180 tablet 3   No current facility-administered medications for this visit.      Past Medical History:  Diagnosis Date  . Anemia    iron def  . Anxiety   . Arrhythmia    paroxysmal SVT and symtomatic PAC's  . Diverticulosis    colon  . History of colonic polyps    cecal  . Hypertension   . Osteoporosis 07/21/2011   t score -2.7  . Stricture esophagus   . Vitamin D deficiency    2010    ROS:   All systems reviewed and negative except as noted in the HPI.   Past Surgical History:  Procedure Laterality Date  . A&P, enterocele repair     s/p 2003  . ABDOMINAL HYSTERECTOMY    . BREAST REDUCTION SURGERY     s/p 1996  . HEMORRHOID SURGERY       Family History  Problem Relation Age of Onset  . Stroke Father     @ 13  . Hypertension Father   . Hypertension Sister   . Cancer Sister 66    brain  . Breast cancer Other 42    breast  . Breast cancer Daughter 35  . Cancer Daughter 6    breast  . Cancer Other 64    colon/MELANOMA  . Stroke Mother     medication induced  . Kidney disease Brother      Social History   Social History  . Marital status: Married    Spouse name: N/A   . Number of children: N/A  . Years of education: N/A   Occupational History  . Retired    Social History Main Topics  . Smoking status: Former Smoker    Quit date: 05/24/1968  . Smokeless tobacco: Never Used     Comment: only smoked 7 years  . Alcohol use No  . Drug use: No  . Sexual activity: No     Comment: 1st intercourse 37 yo-1 partner   Other Topics Concern  . Not on file   Social History Narrative  . No narrative on file     BP 140/82   Pulse 76   Ht 5\' 5"  (1.651 m)   Wt 150 lb 12.8 oz (68.4 kg)   LMP 05/24/1970   BMI 25.09 kg/m   Physical Exam:  Well appearing elderly woman, NAD HEENT: Unremarkable Neck:  6 cm JVD, no thyromegally Back:  No CVA tenderness Lungs:  Clear with no wheezes, rales, or rhonchi. HEART:  Regular rate rhythm, no murmurs, no rubs, no clicks Abd:  soft, positive bowel sounds, no organomegally, no rebound, no guarding Ext:  2 plus  pulses, no edema, no cyanosis, no clubbing, right leg with minimal swelling Skin:  No rashes no nodules Neuro:  CN II through XII intact, motor grossly intact  EKG Normal sinus rhythm, normal axis and intervals.  24 hour holter reviewed - NSR with PAC's and PVC's  Assess/Plan: 1. Atypical chest pain assoicated with sob, minimally symptomatic. - she is better and her stress test was negative. She will undergo watchful waiting. 2. Palpitations - she is minimally symptomatic. She will continue her flecainide at low dose. Might consider adding metoprolol if she has breakthrough symptoms. 3. HTN - her blood pressure is better today on the beta blocker. She is encouraged to reduce her salt intake.  Mikle Bosworth.D.

## 2016-05-11 NOTE — Patient Instructions (Addendum)
Medication Instructions:  Your physician recommends that you continue on your current medications as directed. Please refer to the Current Medication list given to you today.   Labwork: None Ordered   Testing/Procedures: None Ordered   Follow-Up: Your physician wants you to follow-up in: 6 months with Tommye Standard, PA and 1 year with Dr. Lovena Le.  You will receive a reminder letter in the mail two months in advance. If you don't receive a letter, please call our office to schedule the follow-up appointment.   Any Other Special Instructions Will Be Listed Below (If Applicable).     If you need a refill on your cardiac medications before your next appointment, please call your pharmacy.

## 2016-06-03 ENCOUNTER — Encounter: Payer: Self-pay | Admitting: Internal Medicine

## 2016-06-24 ENCOUNTER — Telehealth: Payer: Self-pay | Admitting: Internal Medicine

## 2016-06-24 NOTE — Telephone Encounter (Signed)
Pt called in and wants to know if Dr Sharlet Salina would call her in lor

## 2016-07-10 ENCOUNTER — Ambulatory Visit (HOSPITAL_COMMUNITY)
Admission: EM | Admit: 2016-07-10 | Discharge: 2016-07-10 | Disposition: A | Discharge status: Home / Self Care | Payer: Medicare Other | Attending: Family Medicine | Admitting: Family Medicine

## 2016-07-10 ENCOUNTER — Encounter (HOSPITAL_COMMUNITY): Payer: Self-pay | Admitting: Family Medicine

## 2016-07-10 DIAGNOSIS — J0100 Acute maxillary sinusitis, unspecified: Secondary | ICD-10-CM | POA: Diagnosis not present

## 2016-07-10 MED ORDER — FEXOFENADINE HCL 180 MG PO TABS: 180.0000 mg | ORAL_TABLET | Freq: Every day | ORAL | 1 refills | Status: DC

## 2016-07-10 MED ORDER — AZITHROMYCIN 250 MG PO TABS: 250.0000 mg | ORAL_TABLET | Freq: Every day | ORAL | 0 refills | Status: AC

## 2016-07-10 MED ORDER — HYDROCOD POLST-CPM POLST ER 10-8 MG/5ML PO SUER: 5.0000 mL | Freq: Two times a day (BID) | ORAL | 0 refills | Status: AC | PRN

## 2016-07-10 NOTE — ED Triage Notes (Signed)
Pt here for drainage in throat, cough, sinus drainage. sts headache.

## 2016-07-10 NOTE — ED Provider Notes (Signed)
CSN: [id number]     Arrival date & time 07/10/16  1209 History   First MD Initiated Contact with Patient 07/10/16 1333     Chief Complaint  Patient presents with  . Sore Throat  . Fever   (Consider location/radiation/quality/duration/timing/severity/associated sxs/prior Treatment)  Patient is a well-appearing 83 y.o. Female, started getting sick 3-4 days ago, symptoms started off as a headache but has now progressed to productive cough, congestion, running nose, sore throat, ear pain and PND. She denies chest pain, dizziness, SOB, wheezing, some sinus pressure/pain. She does endorses fever with highest being 102.0 at home. No alleviating or aggravating factors reports. She have not tried anything OTC for her symptoms. Patient believes her symptoms are worsening. Patients to have underlying allergy problem and would like a refill of her Allegra.       Past Medical History:  Diagnosis Date  . Anemia    iron def  . Anxiety   . Arrhythmia    paroxysmal SVT and symtomatic PAC's  . Diverticulosis    colon  . History of colonic polyps    cecal  . Hypertension   . Osteoporosis 07/21/2011   t score -2.7  . Stricture esophagus   . Vitamin D deficiency    2010   Past Surgical History:  Procedure Laterality Date  . A&P, enterocele repair     s/p 2003  . ABDOMINAL HYSTERECTOMY    . BREAST REDUCTION SURGERY     s/p 1996  . HEMORRHOID SURGERY     Family History  Problem Relation Age of Onset  . Stroke Father     @ 31  . Hypertension Father   . Hypertension Sister   . Cancer Sister 27    brain  . Breast cancer Other 42    breast  . Breast cancer Daughter 34  . Cancer Daughter 65    breast  . Cancer Other 52    colon/MELANOMA  . Stroke Mother     medication induced  . Kidney disease Brother    Social History  Substance Use Topics  . Smoking status: Former Smoker    Quit date: 05/24/1968  . Smokeless tobacco: Never Used     Comment: only smoked 7 years  . Alcohol use  No   OB History    Gravida Para Term Preterm AB Living   2 2       1    SAB TAB Ectopic Multiple Live Births                 Review of Systems  Constitutional: Positive for chills and fever. Negative for fatigue.  HENT: Positive for congestion, ear pain, rhinorrhea, sinus pain, sinus pressure, sneezing and sore throat.   Respiratory: Positive for cough. Negative for shortness of breath and stridor.   Cardiovascular: Negative for chest pain and palpitations.  Gastrointestinal: Negative for abdominal pain, diarrhea, nausea and vomiting.  Musculoskeletal: Negative for myalgias.  Neurological: Positive for headaches. Negative for dizziness.    Allergies  Zyrtec [cetirizine]  Home Medications   Prior to Admission medications   Medication Sig Start Date End Date Taking? Authorizing Provider  aspirin 81 MG tablet Take 81 mg by mouth daily.    Historical Provider, MD  azithromycin (ZITHROMAX) 250 MG tablet Take 1 tablet (250 mg total) by mouth daily. Take first 2 tablets together, then 1 every day until finished. 07/10/16 07/16/16  Barry Dienes, NP  chlorpheniramine-HYDROcodone (TUSSIONEX PENNKINETIC ER) 10-8 MG/5ML SUER Take 5 mLs by  mouth every 12 (twelve) hours as needed for cough. 07/10/16 07/15/16  Barry Dienes, NP  Cholecalciferol (VITAMIN D3) 1000 UNITS CAPS Take 1,000 Units by mouth daily.     Historical Provider, MD  fexofenadine (ALLEGRA) 180 MG tablet Take 1 tablet (180 mg total) by mouth daily. 07/10/16 08/09/16  Barry Dienes, NP  flecainide (TAMBOCOR) 50 MG tablet Take 1 tablet (50 mg total) by mouth 2 (two) times daily. 05/11/16   Evans Lance, MD   Meds Ordered and Administered this Visit  Medications - No data to display  BP 161/79 (BP Location: Right Arm)   Pulse 76   Temp 98.6 F (37 C) (Oral)   Resp 16   LMP 05/24/1970   SpO2 99%  No data found.   Physical Exam  Constitutional: She is oriented to person, place, and time. She appears well-developed and well-nourished.   HENT:  Head: Normocephalic and atraumatic.  Right Ear: External ear normal.  Left Ear: External ear normal.  Nose: Nose normal.  Mouth/Throat: Oropharynx is clear and moist. No oropharyngeal exudate.  Tm pearly gray bilaterally with no erythema. No sinus pressure on percussion  Eyes: Conjunctivae are normal. Pupils are equal, round, and reactive to light.  Neck: Normal range of motion.  Cardiovascular: Normal rate, regular rhythm and normal heart sounds.   No murmur heard. Pulmonary/Chest: Effort normal and breath sounds normal. No respiratory distress. She has no wheezes.  Abdominal: Soft. Bowel sounds are normal. She exhibits no distension. There is no tenderness.  Lymphadenopathy:    She has no cervical adenopathy.  Neurological: She is alert and oriented to person, place, and time.  Skin: Skin is warm and dry.  Psychiatric: She has a normal mood and affect.  Nursing note and vitals reviewed.   Urgent Care Course     Procedures (including critical care time)  Labs Review Labs Reviewed - No data to display  Imaging Review No results found.   MDM   1. Acute non-recurrent maxillary sinusitis    Patient presents today with severe URI symptoms of 3-4 days duration that is worsening. Believes patient to have sinusitis. Antibiotic given (patient prefers z-pak). RX for tussionex given for cough. Allegra refilled per request. Informed to f/u with PCP if no improvement is noted despite tx today.     Barry Dienes, NP 07/10/16 1359

## 2016-07-23 ENCOUNTER — Encounter: Payer: Self-pay | Admitting: Internal Medicine

## 2016-09-16 DIAGNOSIS — H9113 Presbycusis, bilateral: Secondary | ICD-10-CM | POA: Diagnosis not present

## 2016-09-16 DIAGNOSIS — H903 Sensorineural hearing loss, bilateral: Secondary | ICD-10-CM | POA: Diagnosis not present

## 2016-09-16 DIAGNOSIS — H9311 Tinnitus, right ear: Secondary | ICD-10-CM | POA: Diagnosis not present

## 2016-10-28 ENCOUNTER — Telehealth: Payer: Self-pay | Admitting: *Deleted

## 2016-10-28 ENCOUNTER — Ambulatory Visit (INDEPENDENT_AMBULATORY_CARE_PROVIDER_SITE_OTHER): Payer: Medicare Other | Admitting: Physician Assistant

## 2016-10-28 VITALS — BP 164/88 | HR 76 | Ht 65.5 in | Wt 150.0 lb

## 2016-10-28 DIAGNOSIS — Z79899 Other long term (current) drug therapy: Secondary | ICD-10-CM | POA: Diagnosis not present

## 2016-10-28 DIAGNOSIS — I491 Atrial premature depolarization: Secondary | ICD-10-CM

## 2016-10-28 DIAGNOSIS — I10 Essential (primary) hypertension: Secondary | ICD-10-CM | POA: Diagnosis not present

## 2016-10-28 DIAGNOSIS — I493 Ventricular premature depolarization: Secondary | ICD-10-CM | POA: Diagnosis not present

## 2016-10-28 DIAGNOSIS — R002 Palpitations: Secondary | ICD-10-CM

## 2016-10-28 MED ORDER — METOPROLOL TARTRATE 25 MG PO TABS: 12.5000 mg | ORAL_TABLET | Freq: Two times a day (BID) | ORAL | 1 refills | Status: DC

## 2016-10-28 NOTE — Patient Instructions (Signed)
Medication Instructions:   START TAKING  LOPRESSOR 12.5 MG OF (25 MG TABLET)  TWICE A DAY   If you need a refill on your cardiac medications before your next appointment, please call your pharmacy.   Labwork:  NONE ORDERED  TODAY    Testing/Procedures: NONE ORDERED  TODAY'   Follow-Up:  IN 3 WEEKS  WITH NURSE VISIT BLOOD PRESSURE MANAGEMENT   IN 3 MONTHS WITH TAYLOR OR URSUY  Any Other Special Instructions Will Be Listed Below (If Applicable).

## 2016-10-28 NOTE — Progress Notes (Signed)
Cardiology Office Note Date:  10/28/2016  Patient ID:  [name] [name], DOB 04/10/1930, MRN [id number] PCP:  Hoyt Koch, MD Electrophysiologist: Dr. Lovena Le                                                                                                      Chief Complaint: planned f/u  History of Present Illness: [name] [name] is a 83 y.o. female with history of palpitations (PSVT is mention in her notes, though Dr. Tanna Furry note state no sustained arrhythmias and APC's treated with Flecainide), HTN, she comes in today to be seen for Dr. Lovena Le, last seen by him in December, doing well back on her Flecainide.  Historically had been maintained on flecainide for years for symptomatic PACs/PVCs though during a hospital stay CP stopped and started on metoprolol that she was very intolerant of,  and Rx Rythmol but never started. At an office visit with myself in November, noted with PVCs that she was feeling/very bothersome to her, and given negative stress test resumed on her flecainide.    She was seen in f/u by Dr. Lovena Le in Dec doing better with minimal palpitations, discussed adding metoprolol if more palpitations.  She feels like her BP has been high, getting headaches lately.  She has minimal palpitatins that are not bothersome to her, no CP, no SOB.  She denies dizziness, near syncope or syncope.  She is seeing ENT for tinittus and hearing loss just fitted for hearing aids.  She has had ear ringing on/off over the years, but has become perticularly bothersome, sometimes makes it hard to get to sleep.    Past Medical History:  Diagnosis Date  . Anemia    iron def  . Anxiety   . Arrhythmia    paroxysmal SVT and symtomatic PAC's  . Diverticulosis    colon  . History of colonic polyps    cecal  . Hypertension   . Osteoporosis 07/21/2011   t score -2.7  . Stricture esophagus   . Vitamin D deficiency    2010    Past Surgical History:  Procedure Laterality Date  . A&P,  enterocele repair     s/p 2003  . ABDOMINAL HYSTERECTOMY    . BREAST REDUCTION SURGERY     s/p 1996  . HEMORRHOID SURGERY      Current Outpatient Prescriptions  Medication Sig Dispense Refill  . aspirin 81 MG tablet Take 81 mg by mouth daily.    . Cholecalciferol (VITAMIN D3) 1000 UNITS CAPS Take 1,000 Units by mouth daily.     . fexofenadine (ALLEGRA) 180 MG tablet Take 1 tablet (180 mg total) by mouth daily. (Patient taking differently: Take 180 mg by mouth daily as needed. ) 30 tablet 1  . flecainide (TAMBOCOR) 50 MG tablet Take 1 tablet (50 mg total) by mouth 2 (two) times daily. 180 tablet 3   No current facility-administered medications for this visit.     Allergies:   Zyrtec [cetirizine]   Social History:  The patient  reports that she quit smoking about 48  years ago. She has never used smokeless tobacco. She reports that she does not drink alcohol or use drugs.   Family History:  The patient's family history includes Breast cancer (age of onset: 39) in her other; Breast cancer (age of onset: 63) in her daughter; Cancer (age of onset: 5) in her other; Cancer (age of onset: 65) in her daughter; Cancer (age of onset: 8) in her sister; Hypertension in her father and sister; Kidney disease in her brother; Stroke in her father and mother.  ROS:  Please see the history of present illness.  All other systems are reviewed and otherwise negative.   PHYSICAL EXAM:  VS:  BP (!) 164/88   Pulse 76   Ht 5' 5.5" (1.664 m)   Wt 150 lb (68 kg)   LMP 05/24/1970   BMI 24.58 kg/m  BMI: Body mass index is 24.58 kg/m. Well nourished, well developed, in no acute distress  HEENT: normocephalic, atraumatic  Neck: no JVD, carotid bruits or masses Cardiac: RRR; 1/6 SM, no rubs, or gallops Lungs:  CTA b/l, no wheezing, rhonchi or rales  Abd: soft, nontender MS: no deformity or atrophy Ext:  no edema  Skin: warm and dry Neuro:  No gross deficits appreciated Psych: euthymic mood, full  affect  EKG:  Done  Today and reviewed by myself is SR, 76bpm, borderline 1st degree AVBlock, PR 238ms, QRS 98ms, measured QT/QTc 325ms/405ms, not significantly changed from prior  03/15/16: pharmacological stress myoview IMPRESSION: 1. No reversible ischemia or infarction. 2. Normal left ventricular wall motion. Hyperdynamic left ventricle. 3. Left ventricular ejection fraction 93% 4. Non invasive risk stratification*: Low  12/26/15: TTE Study Conclusions - Left ventricle: The cavity size was normal. Wall thickness was   normal. Systolic function was normal. The estimated ejection   fraction was in the range of 55% to 60%. Wall motion was normal;   there were no regional wall motion abnormalities. Doppler   parameters are consistent with abnormal left ventricular   relaxation (grade 1 diastolic dysfunction). - Aortic valve: Trileaflet; mildly thickened, mildly calcified   leaflets. - Mitral valve: There was trivial regurgitation. - Right ventricle: The cavity size was mildly dilated. Wall   thickness was normal. - Tricuspid valve: There was trivial regurgitation.  Recent Labs: 03/13/2016: BUN 12; Creatinine, Ser 0.75; Hemoglobin 13.6; Magnesium 2.1; Platelets 295; Potassium 3.5; Sodium 141; TSH 3.987  No results found for requested labs within last 8760 hours.   CrCl cannot be calculated (Patient's most recent lab result is older than the maximum 21 days allowed.).   Wt Readings from Last 3 Encounters:  10/28/16 150 lb (68 kg)  05/11/16 150 lb 12.8 oz (68.4 kg)  04/08/16 153 lb (69.4 kg)     Other studies reviewed: Additional studies/records reviewed today include: summarized above  ASSESSMENT AND PLAN:  1. Palpitations, PVCs, hx of PACs     Much improved, back on Flecainide, no SOB     EKG/intervals look OK   2. HTN     Resume metoprolol tartrate 12.5mg  BID  Disposition: She does not currently have a BP cuff at home, will have her see RN/BP visit to check on her BP,  otherwise 3 months, sooner if needed.  Can be moved to 6 months if her BP is controlled.       Haywood Lasso, PA-C 10/28/2016 1:41 PM     [place] [address] [phone] (office)  [phone] (fax)

## 2016-10-28 NOTE — Telephone Encounter (Signed)
CONTACTED PHARMACY  REP [place] FOR 90  FOR 90 NOT 180 AS SEEN ON MY SCREEN.    WHICH WAS REASON TO CALL IN TO CORRECT.

## 2016-11-17 NOTE — Progress Notes (Signed)
Patient ID: [name] [name]                 DOB: 11-06-1929                      MRN: [id number]     HPI: [name] [name] is a 83 y.o. female patient of Dr. Lovena Le, referred by Tommye Standard, PA-C, to HTN clinic. PMH is significant for palpitations (PSVT is mention in her notes, though Dr. Tanna Furry note state no sustained arrhythmias and APC's treated with flecainide), HTN, and anxiety. At her last office visit on 10/28/16, patient reported headaches and BP was elevated at clinic (164/88 mmHg), so she was restarted on metoprolol tartrate 12.5mg  bid.   Patient presents today for HTN clinic establishment. Overall, she reports feeling well. However, she has not started metoprolol yet. She states that her BP was elevated at last visit because that week her husband was diagnosed with dementia which caused her stress. Instead she has been closely monitoring her BP at home. Her home BP readings have been variable with most values near 130-140s/70-80s - a few elevated readings when she is active. She reports headaches that she describes as "throbbing" on the right side of her head. Of note, she states this is in the same location as her TIA in the late 90s which she described as an Hydrologist zag" feeling. She denies dizziness or blurred vision. She brought her home cuffs to clinic today - 141/74 (wrist), 148/92 (arm). These cuffs seem to be running ~10 mmHg higher than today's manual cuff reading.   Of note, she reports tingling in her leg, especially behind the knee. She does have a history of varicose veins. She has some swelling behind the L knee, but it isn't red or warm to touch. Instructed her to follow-up with PCP about this issue.    Current HTN meds:  *only on flecanide 50mg  bid  Previously tried: losartan 100mg  (leg shaking, hypotension)  BP goal: < 130/80 mmHg  Family History: The patient's family history includes Breast cancer (age of onset: 50) in her other; Breast cancer (age of onset: 7) in her  daughter; Cancer (age of onset: 51) in her other; Cancer (age of onset: 15) in her daughter; Cancer (age of onset: 68) in her sister; Hypertension in her father and sister; Kidney disease in her brother; Stroke in her father and mother.  Social History: The patient  reports that she quit smoking about 48 years ago. She has never used smokeless tobacco. She reports that she does not drink alcohol or use drugs  Diet: Vegetarian, Ensure (1 daily), OJ, 1 cup coffee in AM and afternoon  Exercise: No formal exercise. Housework, gardening.   Home BP readings: SBP 110-150, DBP 70-80, HR 60-70  Wt Readings from Last 3 Encounters:  10/28/16 150 lb (68 kg)  05/11/16 150 lb 12.8 oz (68.4 kg)  04/08/16 153 lb (69.4 kg)   BP Readings from Last 3 Encounters:  11/18/16 132/70  10/28/16 (!) 164/88  07/10/16 161/79   Pulse Readings from Last 3 Encounters:  11/18/16 77  10/28/16 76  07/10/16 76    Renal function: CrCl cannot be calculated (Patient's most recent lab result is older than the maximum 21 days allowed.).  Past Medical History:  Diagnosis Date  . Anemia    iron def  . Anxiety   . Arrhythmia    paroxysmal SVT and symtomatic PAC's  . Diverticulosis    colon  .  History of colonic polyps    cecal  . Hypertension   . Osteoporosis 07/21/2011   t score -2.7  . Stricture esophagus   . Vitamin D deficiency    2010    Current Outpatient Prescriptions on File Prior to Visit  Medication Sig Dispense Refill  . Cholecalciferol (VITAMIN D3) 1000 UNITS CAPS Take 1,000 Units by mouth daily.     . flecainide (TAMBOCOR) 50 MG tablet Take 1 tablet (50 mg total) by mouth 2 (two) times daily. 180 tablet 3  . aspirin 81 MG tablet Take 81 mg by mouth daily.    . fexofenadine (ALLEGRA) 180 MG tablet Take 1 tablet (180 mg total) by mouth daily. (Patient taking differently: Take 180 mg by mouth daily as needed. ) 30 tablet 1   No current facility-administered medications on file prior to visit.       Allergies  Allergen Reactions  . Zyrtec [Cetirizine]     Palpitations     Assessment/Plan:  1. Hypertension - BP at goal of < 130/80 mmHg in clinic today with home BP readings that are elevated. Home readings were higher at the beginning of the month with the diagnosis of dementia in her husband, however have trended back down to normal in recent weeks. Patient hasn't yet started metoprolol and prefers not to. Will not restart since BP is at goal today. Instructed patient to continue to follow BP at home and call with elevated readings. Follow-up with HTN clinic in 4 weeks.  Belia Heman, PharmD PGY1 Resident 11/18/2016 2:39 PM  Patient seen with: Fuller Canada, PharmD, CPP, [place] [address] [place], [place] [address] Phone: [phone]; Fax: [phone]

## 2016-11-18 ENCOUNTER — Ambulatory Visit (INDEPENDENT_AMBULATORY_CARE_PROVIDER_SITE_OTHER): Payer: Medicare Other | Admitting: Pharmacist

## 2016-11-18 VITALS — BP 132/70 | HR 77

## 2016-11-18 DIAGNOSIS — I10 Essential (primary) hypertension: Secondary | ICD-10-CM

## 2016-11-18 NOTE — Patient Instructions (Addendum)
It was great to see you today!   Please continue to monitor your blood pressure at home. Measure 3 times and average the numbers together. Measure your blood pressure when you have that throbbing feeling in your head. If your blood pressure is >150/90 mmHg for multiple days in a row, please call the clinic.   Follow-up with HTN clinic in 4 weeks

## 2016-12-16 ENCOUNTER — Ambulatory Visit: Payer: Medicare Other

## 2017-01-07 ENCOUNTER — Encounter: Payer: Self-pay | Admitting: *Deleted

## 2017-01-29 ENCOUNTER — Ambulatory Visit (HOSPITAL_COMMUNITY)
Admission: EM | Admit: 2017-01-29 | Discharge: 2017-01-29 | Disposition: A | Discharge status: Home / Self Care | Payer: Medicare Other | Attending: Family Medicine | Admitting: Family Medicine

## 2017-01-29 ENCOUNTER — Encounter (HOSPITAL_COMMUNITY): Payer: Self-pay | Admitting: *Deleted

## 2017-01-29 DIAGNOSIS — R319 Hematuria, unspecified: Secondary | ICD-10-CM

## 2017-01-29 DIAGNOSIS — R1013 Epigastric pain: Secondary | ICD-10-CM | POA: Diagnosis not present

## 2017-01-29 DIAGNOSIS — R109 Unspecified abdominal pain: Secondary | ICD-10-CM | POA: Insufficient documentation

## 2017-01-29 DIAGNOSIS — K219 Gastro-esophageal reflux disease without esophagitis: Secondary | ICD-10-CM

## 2017-01-29 LAB — POCT URINALYSIS DIP (DEVICE)
Bilirubin Urine: NEGATIVE
Glucose, UA: NEGATIVE mg/dL
Ketones, ur: NEGATIVE mg/dL
Leukocytes, UA: NEGATIVE
Nitrite: NEGATIVE
Protein, ur: NEGATIVE mg/dL
Specific Gravity, Urine: 1.015 (ref 1.005–1.030)
Urobilinogen, UA: 0.2 mg/dL (ref 0.0–1.0)
pH: 5.5 (ref 5.0–8.0)

## 2017-01-29 MED ORDER — OMEPRAZOLE 20 MG PO CPDR: 20.0000 mg | DELAYED_RELEASE_CAPSULE | Freq: Every day | ORAL | 0 refills | Status: DC

## 2017-01-29 NOTE — Discharge Instructions (Signed)
You have some blood in your will send your urine off for a urine culture. We will call you if your urine culture comes back confirming a UTI. You need to follow-up with the primary care doctor in 2 weeks for urine to be rechecked.  Your abdominal discomfort today is believed to be due to reflux, please try the medication for a couple weeks and see if it will help. Also follow up with your primary care doctor in 2 weeks regarding your abdominal pain.

## 2017-01-29 NOTE — ED Triage Notes (Signed)
Pt c/o "tremors" in abd intermittently x 2 wks.  States she had noticed sediment or particles in her bottled water; sent specimen into company to have tested; unsure what was found.  States ever since she drank the water sxs started.  C/o some dysuria.

## 2017-01-29 NOTE — ED Provider Notes (Signed)
[name] [name]    CSN: [id number] Arrival date & time: 01/29/17  1201     History   Chief Complaint Chief Complaint  Patient presents with  . Abdominal Pain    HPI [name] [name] is a 83 y.o. female.   Presents today for intermittent epigastric pain describes as burning, worsen with food intake, presents for few months but seems to be getting worse in the last few weeks. She tried pepto bismol and it did seem to help with the pain.  Approximately 2.5 month ago, she drank a bottle of [employer] Pure Life Purified Water and noticed that there were sediment and particles in her bottled water. She contacted the manufacture and send in the water to [employer] to be analyzed with result from [place] later stating that the water that the patient send in was "milky" with a PH of 2.47. She states that she is not having any abdominal pain but her stomach "just feel irritated".  She is also concern for UTI, reporting urinary irritation.   Patient is not a good historian.  BP noted to be elevated today; patient states that her BP is normally well controlled without medication.    The history is provided by the patient.  Abdominal Pain  Pain location:  Epigastric Pain quality: burning   Pain radiates to:  Does not radiate Pain severity:  No pain Onset quality:  Gradual Duration:  10 weeks Timing:  Intermittent Context comment:  Worsen with food intake Worsened by:  Eating Ineffective treatments: Tried pepto bismol one time and it did help. Associated symptoms: no chills, no diarrhea, no fatigue, no fever, no hematochezia, no hematuria, no nausea, no shortness of breath and no vomiting     Past Medical History:  Diagnosis Date  . Anemia    iron def  . Anxiety   . Arrhythmia    paroxysmal SVT and symtomatic PAC's  . Diverticulosis    colon  . History of colonic polyps    cecal  . Hypertension   . Osteoporosis 07/21/2011   t score -2.7  . Stricture esophagus   . Vitamin D  deficiency    2010    Patient Active Problem List   Diagnosis Date Noted  . Rotator cuff arthropathy, right 04/08/2016  . Abdominal discomfort 04/01/2016  . Shoulder pain 03/25/2016  . Arthritis of knee 03/25/2016  . Chest pain on breathing 03/14/2016  . Presbycusis of both ears 01/15/2016  . Tinnitus of right ear 01/15/2016  . Lipoma 02/04/2015  . Skin cancer of arm 09/26/2014  . Chest discomfort 01/20/2014  . Palpitations 09/28/2012  . Allergic rhinitis 12/25/2011  . TIA (transient ischemic attack) 07/19/2011  . ATRIAL PREMATURE BEATS 03/20/2009  . ANEMIA-IRON DEFICIENCY 04/02/2008  . Anxiety state 04/02/2008  . Essential hypertension 04/02/2008  . GERD 04/02/2008  . OSTEOPOROSIS 04/02/2008    Past Surgical History:  Procedure Laterality Date  . A&P, enterocele repair     s/p 2003  . ABDOMINAL HYSTERECTOMY    . BREAST REDUCTION SURGERY     s/p 1996  . HEMORRHOID SURGERY      OB History    Gravida Para Term Preterm AB Living   2 2       1    SAB TAB Ectopic Multiple Live Births                   Home Medications    Prior to Admission medications   Medication Sig Start  Date End Date Taking? Authorizing Provider  Cod Liver Oil CAPS Take 1 capsule by mouth daily.   Yes [provider]  flecainide (TAMBOCOR) 50 MG tablet Take 1 tablet (50 mg total) by mouth 2 (two) times daily. 05/11/16  Yes Evans Lance, MD  aspirin 81 MG tablet Take 81 mg by mouth daily.    [provider]  Cholecalciferol (VITAMIN D3) 1000 UNITS CAPS Take 1,000 Units by mouth daily.     [provider]  fexofenadine (ALLEGRA) 180 MG tablet Take 1 tablet (180 mg total) by mouth daily. Patient taking differently: Take 180 mg by mouth daily as needed.  07/10/16 08/09/16  Barry Dienes, NP  omeprazole (PRILOSEC) 20 MG capsule Take 1 capsule (20 mg total) by mouth daily. 01/29/17 02/28/17  Barry Dienes, NP    Family History Family History  Problem Relation Age of Onset  .  Stroke Father        @ 1  . Hypertension Father   . Hypertension Sister   . Cancer Sister 74       brain  . Breast cancer Other 42       breast  . Breast cancer Daughter 24  . Cancer Daughter 45       breast  . Cancer Other 7       colon/MELANOMA  . Stroke Mother        medication induced  . Kidney disease Brother     Social History Social History  Substance Use Topics  . Smoking status: Former Smoker    Quit date: 05/24/1968  . Smokeless tobacco: Never Used     Comment: only smoked 7 years  . Alcohol use No     Allergies   Zyrtec [cetirizine]   Review of Systems Review of Systems  Constitutional: Negative for chills, fatigue and fever.  Respiratory: Negative for shortness of breath.   Gastrointestinal: Positive for abdominal pain. Negative for diarrhea, hematochezia, nausea and vomiting.  Genitourinary: Negative for hematuria.     Physical Exam Triage Vital Signs ED Triage Vitals [01/29/17 1215]  Enc Vitals Group     BP (!) 167/97     Pulse Rate 84     Resp 18     Temp 98.3 F (36.8 C)     Temp Source Oral     SpO2 97 %     Weight      Height      Head Circumference      Peak Flow      Pain Score      Pain Loc      Pain Edu?      Excl. in [place]?    No data found.   Updated Vital Signs BP (!) 167/97   Pulse 84   Temp 98.3 F (36.8 C) (Oral)   Resp 18   LMP 05/24/1970   SpO2 97%   Visual Acuity Right Eye Distance:   Left Eye Distance:   Bilateral Distance:    Right Eye Near:   Left Eye Near:    Bilateral Near:     Physical Exam  Constitutional: She is oriented to person, place, and time. She appears well-developed and well-nourished. No distress.  HENT:  Head: Normocephalic and atraumatic.  Neck: Normal range of motion.  Cardiovascular: Normal rate, regular rhythm and normal heart sounds.   No murmur heard. Pulmonary/Chest: Effort normal and breath sounds normal. She has no wheezes.  Abdominal: Soft. Bowel sounds are normal. She  exhibits no distension and no mass. There is no tenderness. There is no rebound.  Neurological: She is alert and oriented to person, place, and time.  Skin: Skin is warm and dry. No rash noted.  Nursing note and vitals reviewed.    UC Treatments / Results  Labs (all labs ordered are listed, but only abnormal results are displayed) Labs Reviewed  POCT URINALYSIS DIP (DEVICE) - Abnormal; Notable for the following:       Result Value   Hgb urine dipstick TRACE (*)    All other components within normal limits  URINE CULTURE    EKG  EKG Interpretation None       Radiology No results found.  Procedures Procedures (including critical care time)  Medications Ordered in UC Medications - No data to display   Initial Impression / Assessment and Plan / UC Course  I have reviewed the triage vital signs and the nursing notes.  Pertinent labs & imaging results that were available during my care of the patient were reviewed by me and considered in my medical decision making (see chart for details).   2  Final Clinical Impressions(s) / UC Diagnoses   Final diagnoses:  GERD without esophagitis  Hematuria, unspecified type   Physical examination unremarkable. No concerning finding on exam. Patient is safe to be discharged home.  GERD: Abdominal pain is believed to be from GERD. Will trial 2-4 weeks of PPI. RX given. Informed to f/u with PCP in 2 weeks for recheck.   Hematuria: Patient informed of this. Doubt UTI. Urine culture pending. Patient informed that will call her if Urine culture is positive for UTI. Informed to f/u with PCP in 2 weeks for urine recheck.   New Prescriptions New Prescriptions   OMEPRAZOLE (PRILOSEC) 20 MG CAPSULE    Take 1 capsule (20 mg total) by mouth daily.     Controlled Substance Prescriptions [place] Controlled Substance Registry consulted? Not Applicable   Barry Dienes, NP 01/29/17 1304

## 2017-01-30 LAB — URINE CULTURE: Culture: <10000 — AB

## 2017-01-31 ENCOUNTER — Encounter: Payer: Self-pay | Admitting: Internal Medicine

## 2017-01-31 ENCOUNTER — Ambulatory Visit (INDEPENDENT_AMBULATORY_CARE_PROVIDER_SITE_OTHER): Payer: Medicare Other | Admitting: Internal Medicine

## 2017-01-31 VITALS — BP 145/84 | HR 90 | Ht 65.0 in | Wt 148.0 lb

## 2017-01-31 DIAGNOSIS — I10 Essential (primary) hypertension: Secondary | ICD-10-CM | POA: Diagnosis not present

## 2017-01-31 DIAGNOSIS — R002 Palpitations: Secondary | ICD-10-CM

## 2017-01-31 DIAGNOSIS — Z79899 Other long term (current) drug therapy: Secondary | ICD-10-CM | POA: Diagnosis not present

## 2017-01-31 NOTE — Progress Notes (Signed)
HPI [name] [name] returns today for ongoing evaluation and management of her PAC's, PVC's and flecainide. The patient is a very pleasant 83 year old woman with a long-standing history of symptomatic palpitations who is been treated with flecainide for over 15 years. The patient has been stable with well-controlled symptoms. She describes an episode approximately 2 months ago where she inadvertently drank bottled water which she states was contaminated. She is quite anxious about this and is concerned that she has developed problems with her GI tract or liver. She denies any diarrhea or constipation or change in her bowel or bladder habit. No syncope. Allergies  Allergen Reactions  . Zyrtec [Cetirizine]     Palpitations     Current Outpatient Prescriptions  Medication Sig Dispense Refill  . Cholecalciferol (VITAMIN D3) 1000 UNITS CAPS Take 1,000 Units by mouth daily.     Marland Kitchen Cod Liver Oil CAPS Take 1 capsule by mouth daily.    . flecainide (TAMBOCOR) 50 MG tablet Take 1 tablet (50 mg total) by mouth 2 (two) times daily. 180 tablet 3   No current facility-administered medications for this visit.      Past Medical History:  Diagnosis Date  . Anemia    iron def  . Anxiety   . Arrhythmia    paroxysmal SVT and symtomatic PAC's  . Diverticulosis    colon  . History of colonic polyps    cecal  . Hypertension   . Osteoporosis 07/21/2011   t score -2.7  . Stricture esophagus   . Vitamin D deficiency    2010    ROS:   All systems reviewed and negative except as noted in the HPI.   Past Surgical History:  Procedure Laterality Date  . A&P, enterocele repair     s/p 2003  . ABDOMINAL HYSTERECTOMY    . BREAST REDUCTION SURGERY     s/p 1996  . HEMORRHOID SURGERY       Family History  Problem Relation Age of Onset  . Stroke Father        @ 55  . Hypertension Father   . Hypertension Sister   . Cancer Sister 36       brain  . Breast cancer Other 42       breast  .  Breast cancer Daughter 66  . Cancer Daughter 79       breast  . Cancer Other 71       colon/MELANOMA  . Stroke Mother        medication induced  . Kidney disease Brother      Social History   Social History  . Marital status: Married    Spouse name: N/A  . Number of children: N/A  . Years of education: N/A   Occupational History  . Retired    Social History Main Topics  . Smoking status: Former Smoker    Quit date: 05/24/1968  . Smokeless tobacco: Never Used     Comment: only smoked 7 years  . Alcohol use No  . Drug use: No  . Sexual activity: No     Comment: 1st intercourse 37 yo-1 partner   Other Topics Concern  . Not on file   Social History Narrative  . No narrative on file     BP (!) 145/84   Pulse 90   Ht 5\' 5"  (1.651 m)   Wt 148 lb (67.1 kg)   LMP 05/24/1970   SpO2 95%   BMI 24.63  kg/m   Physical Exam:  Well appearing 83 year old woman, NAD HEENT: Unremarkable Neck:  No JVD, no thyromegally Lymphatics:  No adenopathy Back:  No CVA tenderness Lungs:  Clear, with no wheezes, rales, or rhonchi HEART:  Regular rate rhythm, no murmurs, no rubs, no clicks Abd:  soft, positive bowel sounds, no organomegally, no rebound, no guarding Ext:  2 plus pulses, no edema, no cyanosis, no clubbing Skin:  No rashes no nodules Neuro:  CN II through XII intact, motor grossly intact  EKG - normal sinus rhythm  Assess/Plan: 1. Recurrent atrial and ventricular ectopy - her symptoms are well-controlled. She will continue flecainide. Last the patient to obtain a BMP and a liver panel as either of these could affect her metabolism of flecainide. 2. Hypertension - she is anxious today. Her systolic blood pressure is elevated. She admits to sodium indiscretion. Will follow-up.  Cristopher Peru, M.D.

## 2017-01-31 NOTE — Patient Instructions (Addendum)
Medication Instructions:  Your physician recommends that you continue on your current medications as directed. Please refer to the Current Medication list given to you today.  Labwork: Please get a liver panel and BMP today.    Testing/Procedures: None ordered.  Follow-Up: Your physician wants you to follow-up in: one year with Dr. Lovena Le.   You will receive a reminder letter in the mail two months in advance. If you don't receive a letter, please call our office to schedule the follow-up appointment.  Any Other Special Instructions Will Be Listed Below (If Applicable).     If you need a refill on your cardiac medications before your next appointment, please call your pharmacy.

## 2017-02-01 LAB — HEPATIC FUNCTION PANEL
ALT: 10 IU/L (ref 0–32)
AST: 19 IU/L (ref 0–40)
Albumin: 4.8 g/dL — ABNORMAL HIGH (ref 3.5–4.7)
Alkaline Phosphatase: 75 IU/L (ref 39–117)
Bilirubin Total: 0.2 mg/dL (ref 0.0–1.2)
Bilirubin, Direct: 0.07 mg/dL (ref 0.00–0.40)
Total Protein: 7.3 g/dL (ref 6.0–8.5)

## 2017-02-01 LAB — BASIC METABOLIC PANEL
BUN/Creatinine Ratio: 22 (ref 12–28)
BUN: 16 mg/dL (ref 8–27)
CO2: 21 mmol/L (ref 20–29)
Calcium: 9.6 mg/dL (ref 8.7–10.3)
Chloride: 99 mmol/L (ref 96–106)
Creatinine, Ser: 0.73 mg/dL (ref 0.57–1.00)
GFR calc Af Amer: 87 mL/min/{1.73_m2} (ref >59)
GFR calc non Af Amer: 75 mL/min/{1.73_m2} (ref >59)
Glucose: 97 mg/dL (ref 65–99)
Potassium: 4.6 mmol/L (ref 3.5–5.2)
Sodium: 140 mmol/L (ref 134–144)

## 2017-03-26 ENCOUNTER — Other Ambulatory Visit: Payer: Self-pay | Admitting: Internal Medicine

## 2017-05-23 ENCOUNTER — Emergency Department (HOSPITAL_COMMUNITY): Payer: Medicare Other

## 2017-05-23 ENCOUNTER — Encounter (HOSPITAL_COMMUNITY): Payer: Self-pay

## 2017-05-23 ENCOUNTER — Inpatient Hospital Stay (HOSPITAL_COMMUNITY)
Admission: EM | Admit: 2017-05-23 | Discharge: 2017-05-26 | DRG: 287 | Disposition: A | Discharge status: Home / Self Care | Payer: Medicare Other | Attending: Internal Medicine | Admitting: Internal Medicine

## 2017-05-23 DIAGNOSIS — M810 Age-related osteoporosis without current pathological fracture: Secondary | ICD-10-CM | POA: Diagnosis present

## 2017-05-23 DIAGNOSIS — R778 Other specified abnormalities of plasma proteins: Secondary | ICD-10-CM

## 2017-05-23 DIAGNOSIS — I493 Ventricular premature depolarization: Secondary | ICD-10-CM | POA: Diagnosis present

## 2017-05-23 DIAGNOSIS — Z87891 Personal history of nicotine dependence: Secondary | ICD-10-CM

## 2017-05-23 DIAGNOSIS — Z90710 Acquired absence of both cervix and uterus: Secondary | ICD-10-CM

## 2017-05-23 DIAGNOSIS — F419 Anxiety disorder, unspecified: Secondary | ICD-10-CM | POA: Diagnosis present

## 2017-05-23 DIAGNOSIS — I10 Essential (primary) hypertension: Secondary | ICD-10-CM | POA: Diagnosis present

## 2017-05-23 DIAGNOSIS — I2510 Atherosclerotic heart disease of native coronary artery without angina pectoris: Secondary | ICD-10-CM | POA: Diagnosis present

## 2017-05-23 DIAGNOSIS — R0789 Other chest pain: Secondary | ICD-10-CM | POA: Diagnosis not present

## 2017-05-23 DIAGNOSIS — R002 Palpitations: Secondary | ICD-10-CM | POA: Diagnosis not present

## 2017-05-23 DIAGNOSIS — R030 Elevated blood-pressure reading, without diagnosis of hypertension: Secondary | ICD-10-CM | POA: Diagnosis not present

## 2017-05-23 DIAGNOSIS — R079 Chest pain, unspecified: Secondary | ICD-10-CM | POA: Diagnosis present

## 2017-05-23 DIAGNOSIS — R7989 Other specified abnormal findings of blood chemistry: Secondary | ICD-10-CM

## 2017-05-23 DIAGNOSIS — R0602 Shortness of breath: Secondary | ICD-10-CM | POA: Diagnosis not present

## 2017-05-23 HISTORY — DX: Palpitations: R00.2

## 2017-05-23 LAB — URINALYSIS, ROUTINE W REFLEX MICROSCOPIC
Bacteria, UA: NONE SEEN
Bilirubin Urine: NEGATIVE
Glucose, UA: NEGATIVE mg/dL
Ketones, ur: NEGATIVE mg/dL
Nitrite: NEGATIVE
Protein, ur: NEGATIVE mg/dL
Specific Gravity, Urine: 1.002 — ABNORMAL LOW (ref 1.005–1.030)
Squamous Epithelial / LPF: NONE SEEN
pH: 7 (ref 5.0–8.0)

## 2017-05-23 LAB — CBC
HCT: 40 % (ref 36.0–46.0)
Hemoglobin: 13.5 g/dL (ref 12.0–15.0)
MCH: 31.8 pg (ref 26.0–34.0)
MCHC: 33.8 g/dL (ref 30.0–36.0)
MCV: 94.1 fL (ref 78.0–100.0)
Platelets: 256 10*3/uL (ref 150–400)
RBC: 4.25 MIL/uL (ref 3.87–5.11)
RDW: 13.5 % (ref 11.5–15.5)
WBC: 4.4 10*3/uL (ref 4.0–10.5)

## 2017-05-23 LAB — BASIC METABOLIC PANEL
Anion gap: 12 (ref 5–15)
BUN: 14 mg/dL (ref 6–20)
CO2: 23 mmol/L (ref 22–32)
Calcium: 9.6 mg/dL (ref 8.9–10.3)
Chloride: 104 mmol/L (ref 101–111)
Creatinine, Ser: 0.67 mg/dL (ref 0.44–1.00)
GFR calc Af Amer: >60 mL/min (ref >60)
GFR calc non Af Amer: >60 mL/min (ref >60)
Glucose, Bld: 112 mg/dL — ABNORMAL HIGH (ref 65–99)
Potassium: 3.5 mmol/L (ref 3.5–5.1)
Sodium: 139 mmol/L (ref 135–145)

## 2017-05-23 LAB — I-STAT TROPONIN, ED: Troponin i, poc: 0.02 ng/mL (ref 0.00–0.08)

## 2017-05-23 MED ORDER — NITROGLYCERIN 0.4 MG SL SUBL: 0.4000 mg | SUBLINGUAL_TABLET | SUBLINGUAL | Status: DC | PRN

## 2017-05-23 NOTE — ED Provider Notes (Signed)
[facility] EMERGENCY DEPARTMENT Provider Note   CSN: [id number] Arrival date & time: 05/23/17  1813     History   Chief Complaint Chief Complaint  Patient presents with  . Chest Pain    HPI [name] [name] is a 83 y.o. female.  HPI  83 year old female presents with chest tightness.  She states that for about a couple hours starting this afternoon she has been having chest tightness.  It feels like something is squeezing her chest.  She also has some numbness over her left lower anterior chest.  When she first awoke she was having typical palpitations that she takes flecainide for.  This is been on and off all day.  She also had a headache that has gone.  However her blood pressure was 315 systolic and now when she checked in the afternoon it was 176 and 160 systolic.  This is atypical for her.  She does not have any known coronary disease.  She denies any leg swelling.  She has been feeling short of breath since the chest tightness started.  She was given aspirin by EMS.  Past Medical History:  Diagnosis Date  . Anemia    iron def  . Anxiety   . Arrhythmia    paroxysmal SVT and symtomatic PAC's  . Diverticulosis    colon  . History of colonic polyps    cecal  . Hypertension   . Osteoporosis 07/21/2011   t score -2.7  . Stricture esophagus   . Vitamin D deficiency    2010    Patient Active Problem List   Diagnosis Date Noted  . Rotator cuff arthropathy, right 04/08/2016  . Abdominal discomfort 04/01/2016  . Shoulder pain 03/25/2016  . Arthritis of knee 03/25/2016  . Chest pain on breathing 03/14/2016  . Presbycusis of both ears 01/15/2016  . Tinnitus of right ear 01/15/2016  . Lipoma 02/04/2015  . Skin cancer of arm 09/26/2014  . Chest discomfort 01/20/2014  . Palpitations 09/28/2012  . Allergic rhinitis 12/25/2011  . TIA (transient ischemic attack) 07/19/2011  . ATRIAL PREMATURE BEATS 03/20/2009  . ANEMIA-IRON DEFICIENCY 04/02/2008  . Anxiety  state 04/02/2008  . Essential hypertension 04/02/2008  . GERD 04/02/2008  . OSTEOPOROSIS 04/02/2008    Past Surgical History:  Procedure Laterality Date  . A&P, enterocele repair     s/p 2003  . ABDOMINAL HYSTERECTOMY    . BREAST REDUCTION SURGERY     s/p 1996  . HEMORRHOID SURGERY      OB History    Gravida Para Term Preterm AB Living   2 2       1    SAB TAB Ectopic Multiple Live Births                   Home Medications    Prior to Admission medications   Medication Sig Start Date End Date Taking? Authorizing Provider  Cholecalciferol (VITAMIN D3) 1000 UNITS CAPS Take 1,000 Units by mouth daily.     [provider]  [facility] Liver Oil CAPS Take 1 capsule by mouth daily.    [provider]  flecainide (TAMBOCOR) 50 MG tablet Take 1 tablet (50 mg total) 2 (two) times daily by mouth. 03/28/17   Evans Lance, MD    Family History Family History  Problem Relation Age of Onset  . Stroke Father        @ 25  . Hypertension Father   . Hypertension Sister   .  Cancer Sister 74       brain  . Breast cancer Other 42       breast  . Breast cancer Daughter 78  . Cancer Daughter 45       breast  . Cancer Other 73       colon/MELANOMA  . Stroke Mother        medication induced  . Kidney disease Brother     Social History Social History   Tobacco Use  . Smoking status: Former Smoker    Last attempt to quit: 05/24/1968    Years since quitting: 49.0  . Smokeless tobacco: Never Used  . Tobacco comment: only smoked 7 years  Substance Use Topics  . Alcohol use: No    Alcohol/week: 0.0 oz  . Drug use: No     Allergies   Zyrtec [cetirizine]   Review of Systems Review of Systems  Respiratory: Positive for chest tightness and shortness of breath.   Cardiovascular: Positive for palpitations. Negative for chest pain.  Gastrointestinal: Negative for nausea and vomiting.  Genitourinary: Positive for frequency.  Neurological: Positive for numbness (left  lower chest).  All other systems reviewed and are negative.    Physical Exam Updated Vital Signs BP (!) 182/104   Pulse 89   Temp 98.7 F (37.1 C) (Oral)   Resp 16   Ht 5\' 6"  (1.676 m)   Wt 63.5 kg (140 lb)   LMP 05/24/1970   SpO2 98%   BMI 22.60 kg/m   Physical Exam  Constitutional: She is oriented to person, place, and time. She appears well-developed and well-nourished.  Non-toxic appearance. She does not appear ill. No distress.  HENT:  Head: Normocephalic and atraumatic.  Right Ear: External ear normal.  Left Ear: External ear normal.  Nose: Nose normal.  Eyes: Right eye exhibits no discharge. Left eye exhibits no discharge.  Cardiovascular: Normal rate, regular rhythm and normal heart sounds.  Pulses:      Radial pulses are 2+ on the right side, and 2+ on the left side.  Pulmonary/Chest: Effort normal and breath sounds normal. She exhibits no tenderness.  Abdominal: Soft. There is no tenderness.  Musculoskeletal: She exhibits no edema.  Neurological: She is alert and oriented to person, place, and time.  Skin: Skin is warm and dry. She is not diaphoretic.  Nursing note and vitals reviewed.    ED Treatments / Results  Labs (all labs ordered are listed, but only abnormal results are displayed) Labs Reviewed  BASIC METABOLIC PANEL - Abnormal; Notable for the following components:      Result Value   Glucose, Bld 112 (*)    All other components within normal limits  URINALYSIS, ROUTINE W REFLEX MICROSCOPIC - Abnormal; Notable for the following components:   Color, Urine COLORLESS (*)    Specific Gravity, Urine 1.002 (*)    Hgb urine dipstick SMALL (*)    Leukocytes, UA SMALL (*)    All other components within normal limits  CBC  I-STAT TROPONIN, ED    EKG  EKG Interpretation  Date/Time:  Monday May 23 2017 18:36:16 EST Ventricular Rate:  85 PR Interval:    QRS Duration: 104 QT Interval:  361 QTC Calculation: 430 R Axis:   54 Text  Interpretation:  Sinus rhythm Borderline prolonged PR interval Borderline repolarization abnormality Confirmed by Sherwood Gambler [phone]) on 05/23/2017 6:39:31 PM       Radiology Dg Chest 2 View  Result Date: 05/23/2017 CLINICAL DATA:  High  blood pressure for couple of days. Chest tightness, shortness of breath, and palpitations since 1530 hours today. EXAM: CHEST  2 VIEW COMPARISON:  03/13/2016 FINDINGS: Emphysematous changes and scattered fibrosis in the lungs. Heart size and pulmonary vascularity are normal. No airspace disease or consolidation in the lungs. No blunting of costophrenic angles. No pneumothorax. Mediastinal contours appear intact. Calcification of the aorta. IMPRESSION: Emphysematous changes in the lungs. No evidence of active pulmonary disease. Aortic atherosclerosis. Electronically Signed   By: Lucienne Capers M.D.   On: 05/23/2017 19:20    Procedures Procedures (including critical care time)  Medications Ordered in ED Medications  nitroGLYCERIN (NITROSTAT) SL tablet 0.4 mg (not administered)     Initial Impression / Assessment and Plan / ED Course  I have reviewed the triage vital signs and the nursing notes.  Pertinent labs & imaging results that were available during my care of the patient were reviewed by me and considered in my medical decision making (see chart for details).     Patient received aspirin with EMS.  Her chest pain has improved as her blood pressure has improved.  She did not receive nitroglycerin but at this point now is pain-free.  She still complains of palpitations but no significant arrhythmias seen.  Given her hypertension and chest pain with nonspecific EKG changes I think she needs admission for ACS rule out.  She is stable at this time, hospitalist to admit.  Final Clinical Impressions(s) / ED Diagnoses   Final diagnoses:  Chest tightness    ED Discharge Orders    None       Sherwood Gambler, MD 05/24/17 0030

## 2017-05-23 NOTE — ED Notes (Signed)
Admitting at the bedside.  

## 2017-05-23 NOTE — ED Notes (Signed)
ED Provider at bedside. 

## 2017-05-23 NOTE — ED Triage Notes (Signed)
Pt from home via EMS with intermittent chest tightness, SOB, and palpitations since 1530 that has since resolved. Denies pain at this time. Denies N/V, diaphoresis, HA. A&Ox4. Ambulatory. EMS VS: 160/106, 87 HR, 97% RA. 324 ASA given en route. 20 G LAC

## 2017-05-23 NOTE — H&P (Signed)
TRH H&P   Patient Demographics:    [name] [name], is a 83 y.o. female  MRN: [id number]   DOB - 04-10-1930  Admit Date - 05/23/2017  Outpatient Primary MD for the patient is Evans Lance, MD  Referring MD/NP/PA: Sherwood Gambler  Outpatient Specialists:  [facility]  Patient coming from: home  Chief Complaint  Patient presents with  . Chest Pain      HPI:    [name] [name]  is a 83 y.o. female, w hx of esophageal stricture, arrythmia, anxiety apparently c/o feeling palpitations in her left chest and epigastric area for the past 3 weeks. Slight dyspnea.  Pt denies fever, chills, cp, n/v, abd pain, diarrhea, brbpr, black stool.   In Ed,  EKG nsr at 80, nl axis, slight st depression  V5,6  CXR IMPRESSION: Emphysematous changes in the lungs. No evidence of active pulmonary disease. Aortic atherosclerosis.  Na 139, K 3.5, Bun 14, Creatinine 0.67 Wbc 4.4, hgb 13.5, Plt 256  ua negative Trop 0.02   Review of systems:    In addition to the HPI above,  No Fever-chills, No Headache, No changes with Vision or hearing, No problems swallowing food or Liquids, No Chest pain, No Cough No Abdominal pain, No Nausea or Vommitting, Bowel movements are regular, No Blood in stool or Urine, No dysuria, No new skin rashes or bruises, No new joints pains-aches,  No new weakness, tingling, numbness in any extremity, No recent weight gain or loss, No polyuria, polydypsia or polyphagia, No significant Mental Stressors.  A full 10 point Review of Systems was done, except as stated above, all other Review of Systems were negative.   With Past History of the following :    Past Medical History:  Diagnosis Date  . Anemia    iron def  . Anxiety   . Arrhythmia    paroxysmal SVT and symtomatic PAC's  . Diverticulosis    colon  . History of colonic polyps    cecal  . Hypertension     . Osteoporosis 07/21/2011   t score -2.7  . Stricture esophagus   . Vitamin D deficiency    2010      Past Surgical History:  Procedure Laterality Date  . A&P, enterocele repair     s/p 2003  . ABDOMINAL HYSTERECTOMY    . BREAST REDUCTION SURGERY     s/p 1996  . HEMORRHOID SURGERY        Social History:     Social History   Tobacco Use  . Smoking status: Former Smoker    Last attempt to quit: 05/24/1968    Years since quitting: 49.0  . Smokeless tobacco: Never Used  . Tobacco comment: only smoked 7 years  Substance Use Topics  . Alcohol use: No    Alcohol/week: 0.0 oz     Lives - at home with husband  Mobility - walks by self   Family History :     Family History  Problem Relation Age of Onset  . Stroke Father        @ 81  . Hypertension Father   . Hypertension Sister   . Cancer Sister 75       brain  . Breast cancer Other 42       breast  . Breast cancer Daughter 77  . Cancer Daughter 76       breast  . Cancer Other 21       colon/MELANOMA  . Stroke Mother        medication induced  . Kidney disease Brother       Home Medications:   Prior to Admission medications   Medication Sig Start Date End Date Taking? Authorizing Provider  Cholecalciferol (VITAMIN D3) 1000 UNITS CAPS Take 1,000 Units by mouth daily as needed (supplemental).    Yes [provider]  Cod Liver Oil CAPS Take 1 capsule by mouth daily.   Yes [provider]  flecainide (TAMBOCOR) 50 MG tablet Take 1 tablet (50 mg total) 2 (two) times daily by mouth. 03/28/17  Yes Evans Lance, MD     Allergies:     Allergies  Allergen Reactions  . Zyrtec [Cetirizine]     Palpitations     Physical Exam:   Vitals  Blood pressure (!) 149/90, pulse 73, temperature 98.7 F (37.1 C), temperature source Oral, resp. rate 12, height 5\' 6"  (1.676 m), weight 63.5 kg (140 lb), last menstrual period 05/24/1970, SpO2 96 %.   1. General  lying in bed in NAD,   2. Normal  affect and insight, Not Suicidal or Homicidal, Awake Alert, Oriented X 3.  3. No F.N deficits, ALL C.Nerves Intact, Strength 5/5 all 4 extremities, Sensation intact all 4 extremities, Plantars down going.  4. Ears and Eyes appear Normal, Conjunctivae clear, PERRLA. Moist Oral Mucosa.  5. Supple Neck, No JVD, No cervical lymphadenopathy appriciated, No Carotid Bruits.  6. Symmetrical Chest wall movement, Good air movement bilaterally, CTAB.  7. RRR, No Gallops, Rubs or Murmurs, No Parasternal Heave.  8. Positive Bowel Sounds, Abdomen Soft, No tenderness, No organomegaly appriciated,No rebound -guarding or rigidity.  9.  No Cyanosis, Normal Skin Turgor, No Skin Rash or Bruise.  10. Good muscle tone,  joints appear normal , no effusions, Normal ROM.  11. No Palpable Lymph Nodes in Neck or Axillae     Data Review:    CBC Recent Labs  Lab 05/23/17 1936  WBC 4.4  HGB 13.5  HCT 40.0  PLT 256  MCV 94.1  MCH 31.8  MCHC 33.8  RDW 13.5   ------------------------------------------------------------------------------------------------------------------  Chemistries  Recent Labs  Lab 05/23/17 1936  NA 139  K 3.5  CL 104  CO2 23  GLUCOSE 112*  BUN 14  CREATININE 0.67  CALCIUM 9.6   ------------------------------------------------------------------------------------------------------------------ estimated creatinine clearance is 48.1 mL/min (by C-G formula based on SCr of 0.67 mg/dL). ------------------------------------------------------------------------------------------------------------------ No results for input(s): TSH, T4TOTAL, T3FREE, THYROIDAB in the last 72 hours.  Invalid input(s): FREET3  Coagulation profile No results for input(s): INR, PROTIME in the last 168 hours. ------------------------------------------------------------------------------------------------------------------- No results for input(s): DDIMER in the last 72  hours. -------------------------------------------------------------------------------------------------------------------  Cardiac Enzymes No results for input(s): CKMB, TROPONINI, MYOGLOBIN in the last 168 hours.  Invalid input(s): CK ------------------------------------------------------------------------------------------------------------------ No results found for: BNP   ---------------------------------------------------------------------------------------------------------------  Urinalysis    Component Value Date/Time  COLORURINE COLORLESS (A) 05/23/2017 1936   APPEARANCEUR CLEAR 05/23/2017 1936   LABSPEC 1.002 (L) 05/23/2017 1936   PHURINE 7.0 05/23/2017 1936   GLUCOSEU NEGATIVE 05/23/2017 1936   GLUCOSEU NEGATIVE 10/31/2013 1403   HGBUR SMALL (A) 05/23/2017 1936   BILIRUBINUR NEGATIVE 05/23/2017 1936   BILIRUBINUR n 08/07/2014 [place] 05/23/2017 1936   PROTEINUR NEGATIVE 05/23/2017 1936   UROBILINOGEN 0.2 01/29/2017 1225   NITRITE NEGATIVE 05/23/2017 1936   LEUKOCYTESUR SMALL (A) 05/23/2017 1936    ----------------------------------------------------------------------------------------------------------------   Imaging Results:    Dg Chest 2 View  Result Date: 05/23/2017 CLINICAL DATA:  High blood pressure for couple of days. Chest tightness, shortness of breath, and palpitations since 1530 hours today. EXAM: CHEST  2 VIEW COMPARISON:  03/13/2016 FINDINGS: Emphysematous changes and scattered fibrosis in the lungs. Heart size and pulmonary vascularity are normal. No airspace disease or consolidation in the lungs. No blunting of costophrenic angles. No pneumothorax. Mediastinal contours appear intact. Calcification of the aorta. IMPRESSION: Emphysematous changes in the lungs. No evidence of active pulmonary disease. Aortic atherosclerosis. Electronically Signed   By: Lucienne Capers M.D.   On: 05/23/2017 19:20      Assessment & Plan:     Principal Problem:   Palpitation Active Problems:   Chest pain    Palpitations Tele Trop I q6h x3 Tsh Cardiac echo Cardiology consulted by email Aortic ultrasound Continue Tambocor         DVT Prophylaxis Lovenox - SCDs   AM Labs Ordered, also please review Full Orders  Family Communication: Admission, patients condition and plan of care including tests being ordered have been discussed with the patient  who indicate understanding and agree with the plan and Code Status.  Code Status FULL CODE  Likely DC to  home  Condition GUARDED    Consults called: cardiology by email  Admission status: observation  Time spent in minutes : 45   Jani Gravel M.D on 05/23/2017 at 9:42 PM  Between 7am to 7pm - Pager - [phone]. After 7pm go to [email or web address] - password [facility]  [facility] - Office  [phone]

## 2017-05-24 ENCOUNTER — Inpatient Hospital Stay (HOSPITAL_COMMUNITY): Payer: Medicare Other

## 2017-05-24 ENCOUNTER — Other Ambulatory Visit: Payer: Self-pay

## 2017-05-24 ENCOUNTER — Encounter (HOSPITAL_COMMUNITY): Payer: Self-pay | Admitting: General Practice

## 2017-05-24 DIAGNOSIS — I493 Ventricular premature depolarization: Secondary | ICD-10-CM | POA: Diagnosis present

## 2017-05-24 DIAGNOSIS — R0789 Other chest pain: Secondary | ICD-10-CM | POA: Diagnosis not present

## 2017-05-24 DIAGNOSIS — R002 Palpitations: Secondary | ICD-10-CM | POA: Diagnosis not present

## 2017-05-24 DIAGNOSIS — R748 Abnormal levels of other serum enzymes: Secondary | ICD-10-CM | POA: Diagnosis not present

## 2017-05-24 DIAGNOSIS — R079 Chest pain, unspecified: Secondary | ICD-10-CM | POA: Diagnosis not present

## 2017-05-24 DIAGNOSIS — I2510 Atherosclerotic heart disease of native coronary artery without angina pectoris: Secondary | ICD-10-CM | POA: Diagnosis present

## 2017-05-24 DIAGNOSIS — I249 Acute ischemic heart disease, unspecified: Secondary | ICD-10-CM | POA: Diagnosis not present

## 2017-05-24 DIAGNOSIS — Z87891 Personal history of nicotine dependence: Secondary | ICD-10-CM | POA: Diagnosis not present

## 2017-05-24 DIAGNOSIS — F419 Anxiety disorder, unspecified: Secondary | ICD-10-CM | POA: Diagnosis present

## 2017-05-24 DIAGNOSIS — M810 Age-related osteoporosis without current pathological fracture: Secondary | ICD-10-CM | POA: Diagnosis present

## 2017-05-24 DIAGNOSIS — Z90710 Acquired absence of both cervix and uterus: Secondary | ICD-10-CM | POA: Diagnosis not present

## 2017-05-24 DIAGNOSIS — I340 Nonrheumatic mitral (valve) insufficiency: Secondary | ICD-10-CM | POA: Diagnosis not present

## 2017-05-24 DIAGNOSIS — I10 Essential (primary) hypertension: Secondary | ICD-10-CM | POA: Diagnosis present

## 2017-05-24 HISTORY — DX: Palpitations: R00.2

## 2017-05-24 LAB — COMPREHENSIVE METABOLIC PANEL
ALT: 10 U/L — ABNORMAL LOW (ref 14–54)
AST: 21 U/L (ref 15–41)
Albumin: 3.8 g/dL (ref 3.5–5.0)
Alkaline Phosphatase: 66 U/L (ref 38–126)
Anion gap: 8 (ref 5–15)
BUN: 12 mg/dL (ref 6–20)
CO2: 26 mmol/L (ref 22–32)
Calcium: 9.3 mg/dL (ref 8.9–10.3)
Chloride: 106 mmol/L (ref 101–111)
Creatinine, Ser: 0.78 mg/dL (ref 0.44–1.00)
GFR calc Af Amer: >60 mL/min (ref >60)
GFR calc non Af Amer: >60 mL/min (ref >60)
Glucose, Bld: 100 mg/dL — ABNORMAL HIGH (ref 65–99)
Potassium: 3.4 mmol/L — ABNORMAL LOW (ref 3.5–5.1)
Sodium: 140 mmol/L (ref 135–145)
Total Bilirubin: 0.8 mg/dL (ref 0.3–1.2)
Total Protein: 6.6 g/dL (ref 6.5–8.1)

## 2017-05-24 LAB — CBC
HCT: 39.2 % (ref 36.0–46.0)
Hemoglobin: 12.9 g/dL (ref 12.0–15.0)
MCH: 31.2 pg (ref 26.0–34.0)
MCHC: 32.9 g/dL (ref 30.0–36.0)
MCV: 94.7 fL (ref 78.0–100.0)
Platelets: 246 10*3/uL (ref 150–400)
RBC: 4.14 MIL/uL (ref 3.87–5.11)
RDW: 13.5 % (ref 11.5–15.5)
WBC: 4.4 10*3/uL (ref 4.0–10.5)

## 2017-05-24 LAB — ECHOCARDIOGRAM COMPLETE
Height: 66 in
Weight: 2313.95 oz

## 2017-05-24 LAB — T4, FREE: Free T4: 0.92 ng/dL (ref 0.61–1.12)

## 2017-05-24 LAB — TROPONIN I
Troponin I: 0.16 ng/mL (ref <0.03)
Troponin I: 0.1 ng/mL (ref <0.03)
Troponin I: 0.07 ng/mL (ref <0.03)

## 2017-05-24 LAB — TSH: TSH: 5.072 u[IU]/mL — ABNORMAL HIGH (ref 0.350–4.500)

## 2017-05-24 MED ORDER — ENSURE ENLIVE PO LIQD
237.0000 mL | Freq: Two times a day (BID) | ORAL | Status: DC
  Administered 2017-05-24 – 2017-05-26 (×2): 237 mL via ORAL

## 2017-05-24 MED ORDER — ACETAMINOPHEN 325 MG PO TABS
650.0000 mg | ORAL_TABLET | Freq: Four times a day (QID) | ORAL | Status: DC | PRN
  Administered 2017-05-24: 650 mg via ORAL
  Filled 2017-05-24: qty 2

## 2017-05-24 MED ORDER — ASPIRIN 325 MG PO TABS
325.0000 mg | ORAL_TABLET | Freq: Every day | ORAL | Status: DC
  Administered 2017-05-24 – 2017-05-26 (×2): 325 mg via ORAL
  Filled 2017-05-24 (×2): qty 1

## 2017-05-24 MED ORDER — SODIUM CHLORIDE 0.9% FLUSH: 3.0000 mL | INTRAVENOUS | Status: DC | PRN

## 2017-05-24 MED ORDER — SODIUM CHLORIDE 0.9% FLUSH
3.0000 mL | Freq: Two times a day (BID) | INTRAVENOUS | Status: DC
  Administered 2017-05-24 – 2017-05-25 (×5): 3 mL via INTRAVENOUS

## 2017-05-24 MED ORDER — ENOXAPARIN SODIUM 40 MG/0.4ML ~~LOC~~ SOLN
40.0000 mg | SUBCUTANEOUS | Status: DC
  Administered 2017-05-24: 40 mg via SUBCUTANEOUS
  Filled 2017-05-24: qty 0.4

## 2017-05-24 MED ORDER — VITAMIN D 1000 UNITS PO TABS: 1000.0000 [IU] | ORAL_TABLET | Freq: Every day | ORAL | Status: DC | PRN

## 2017-05-24 MED ORDER — SODIUM CHLORIDE 0.9 % IV SOLN: 250.0000 mL | INTRAVENOUS | Status: DC | PRN

## 2017-05-24 MED ORDER — PANTOPRAZOLE SODIUM 40 MG PO TBEC
40.0000 mg | DELAYED_RELEASE_TABLET | Freq: Every day | ORAL | Status: DC
  Administered 2017-05-24 – 2017-05-26 (×3): 40 mg via ORAL
  Filled 2017-05-24 (×3): qty 1

## 2017-05-24 MED ORDER — FLECAINIDE ACETATE 50 MG PO TABS
50.0000 mg | ORAL_TABLET | Freq: Two times a day (BID) | ORAL | Status: DC
  Administered 2017-05-24 – 2017-05-26 (×5): 50 mg via ORAL
  Filled 2017-05-24 (×6): qty 1

## 2017-05-24 MED ORDER — COD LIVER OIL PO CAPS: 1.0000 | ORAL_CAPSULE | Freq: Every day | ORAL | Status: DC

## 2017-05-24 MED ORDER — ACETAMINOPHEN 650 MG RE SUPP: 650.0000 mg | Freq: Four times a day (QID) | RECTAL | Status: DC | PRN

## 2017-05-24 MED ORDER — POTASSIUM CHLORIDE CRYS ER 20 MEQ PO TBCR
40.0000 meq | EXTENDED_RELEASE_TABLET | Freq: Once | ORAL | Status: AC
  Administered 2017-05-24: 40 meq via ORAL
  Filled 2017-05-24: qty 2

## 2017-05-24 NOTE — Consult Note (Addendum)
Cardiology Consultation:   Patient ID: [name] [name]; [id number]; 1929-07-18   Admit date: 05/23/2017 Date of Consult: 05/24/2017  Primary Cardiologist: Dr Crissie Sickles Primary Electrophysiologist:  Dr Crissie Sickles   Patient Profile:   [name] [name] is a 84 y.o. female with a hx of atrial and ventricular ectop who is being seen today for the evaluation of elevated troponin and chest pain at the request of Dr [name].  History of Present Illness:   [name] 84 yo female history of HTN, symptomatic PACs/PVCs on flecanide followed by EP presents with chest pain  She reports an episode of chest pain 4 weeks ago while raking leaves. Left sided sharp pain 10/10 radiating under left breast and into armpit. She stopped and rested and resolved. Went back to raking and symptoms reoccurred. Reports episode Sunday night of severe epigastric pain and band like squeezing around her chest. States she took off her bra but still felt like there was a band squeezing her chest. Has never had this type of pain prevoiusly, though does reports some issues with heart burn in the past. Thinks current symptoms may have been worst with food. Felt significantly fatigued, some SOB. Reports over the last 4 weeks chronic symptoms of her intermittent palpitations.    K 3.5, Cr 0.67, WBC 4.4, Hgb 13.5, Plt 256, TSH 5 Trop 0.02-->0.16-->0.10 CXR no acute process Echo pending EKG SR, inferior and lateral precordial ST depressions new  Past Medical History:  Diagnosis Date  . Anemia    iron def  . Anxiety   . Arrhythmia    paroxysmal SVT and symtomatic PAC's  . Diverticulosis    colon  . History of colonic polyps    cecal  . Hypertension   . Osteoporosis 07/21/2011   t score -2.7  . Stricture esophagus   . Vitamin D deficiency    2010    Past Surgical History:  Procedure Laterality Date  . A&P, enterocele repair     s/p 2003  . ABDOMINAL HYSTERECTOMY    . BREAST REDUCTION SURGERY     s/p 1996  . HEMORRHOID  SURGERY        Inpatient Medications: Scheduled Meds: . aspirin  325 mg Oral Daily  . enoxaparin (LOVENOX) injection  40 mg Subcutaneous Q24H  . flecainide  50 mg Oral BID  . pantoprazole  40 mg Oral Daily  . sodium chloride flush  3 mL Intravenous Q12H   Continuous Infusions: . sodium chloride     PRN Meds: sodium chloride, acetaminophen **OR** acetaminophen, cholecalciferol, nitroGLYCERIN, sodium chloride flush  Allergies:    Allergies  Allergen Reactions  . Zyrtec [Cetirizine]     Palpitations    Social History:   Social History   Socioeconomic History  . Marital status: Married    Spouse name: Not on file  . Number of children: Not on file  . Years of education: Not on file  . Highest education level: Not on file  Social Needs  . Financial resource strain: Not on file  . Food insecurity - worry: Not on file  . Food insecurity - inability: Not on file  . Transportation needs - medical: Not on file  . Transportation needs - non-medical: Not on file  Occupational History  . Occupation: Retired  Tobacco Use  . Smoking status: Former Smoker    Last attempt to quit: 05/24/1968    Years since quitting: 49.0  . Smokeless tobacco: Never Used  . Tobacco comment: only smoked  7 years  Substance and Sexual Activity  . Alcohol use: No    Alcohol/week: 0.0 oz  . Drug use: No  . Sexual activity: No    Comment: 1st intercourse 12 yo-1 partner  Other Topics Concern  . Not on file  Social History Narrative  . Not on file    Family History:    Family History  Problem Relation Age of Onset  . Stroke Father        @ 68  . Hypertension Father   . Hypertension Sister   . Cancer Sister 60       brain  . Breast cancer Other 42       breast  . Breast cancer Daughter 69  . Cancer Daughter 47       breast  . Cancer Other 63       colon/MELANOMA  . Stroke Mother        medication induced  . Kidney disease Brother      ROS:  Please see the history of present  illness.  ROS  All other ROS reviewed and negative.     Physical Exam/Data:   Vitals:   05/23/17 2300 05/24/17 0020 05/24/17 0348 05/24/17 0741  BP: (!) 151/78 (!) 157/85 (!) 119/59 135/77  Pulse: 83 88 68 81  Resp: 12 16 18 18   Temp:   97.6 F (36.4 C) 97.8 F (36.6 C)  TempSrc:   Oral Oral  SpO2: 98% 97% 100% 99%  Weight:  144 lb 10 oz (65.6 kg)    Height:        Intake/Output Summary (Last 24 hours) at 05/24/2017 0935 Last data filed at 05/24/2017 0909 Gross per 24 hour  Intake 120 ml  Output -  Net 120 ml   Filed Weights   05/23/17 1825 05/24/17 0020  Weight: 140 lb (63.5 kg) 144 lb 10 oz (65.6 kg)   Body mass index is 23.34 kg/m.  General:  Well nourished, well developed, in no acute distress HEENT: normal Lymph: no adenopathy Neck: no JVD Endocrine:  No thryomegaly Cardiac:  normal S1, S2; RRR; no murmur  Lungs:  clear to auscultation bilaterally, no wheezing, rhonchi or rales  Abd: soft, nontender, no hepatomegaly  Ext: no edema Musculoskeletal:  No deformities, BUE and BLE strength normal and equal Skin: warm and dry  Neuro:  CNs 2-12 intact, no focal abnormalities noted Psych:  Normal affect    Laboratory Data:  Chemistry Recent Labs  Lab 05/23/17 1936 05/24/17 0134  NA 139 140  K 3.5 3.4*  CL 104 106  CO2 23 26  GLUCOSE 112* 100*  BUN 14 12  CREATININE 0.67 0.78  CALCIUM 9.6 9.3  GFRNONAA >60 >60  GFRAA >60 >60  ANIONGAP 12 8    Recent Labs  Lab 05/24/17 0134  PROT 6.6  ALBUMIN 3.8  AST 21  ALT 10*  ALKPHOS 66  BILITOT 0.8   Hematology Recent Labs  Lab 05/23/17 1936 05/24/17 0134  WBC 4.4 4.4  RBC 4.25 4.14  HGB 13.5 12.9  HCT 40.0 39.2  MCV 94.1 94.7  MCH 31.8 31.2  MCHC 33.8 32.9  RDW 13.5 13.5  PLT 256 246   Cardiac Enzymes Recent Labs  Lab 05/24/17 0134 05/24/17 0718  TROPONINI 0.16* 0.10*    Recent Labs  Lab 05/23/17 1948  TROPIPOC 0.02    BNPNo results for input(s): BNP, PROBNP in the last 168 hours.    DDimer No results for input(s): DDIMER  in the last 168 hours.  Radiology/Studies:  Dg Chest 2 View  Result Date: 05/23/2017 CLINICAL DATA:  High blood pressure for couple of days. Chest tightness, shortness of breath, and palpitations since 1530 hours today. EXAM: CHEST  2 VIEW COMPARISON:  03/13/2016 FINDINGS: Emphysematous changes and scattered fibrosis in the lungs. Heart size and pulmonary vascularity are normal. No airspace disease or consolidation in the lungs. No blunting of costophrenic angles. No pneumothorax. Mediastinal contours appear intact. Calcification of the aorta. IMPRESSION: Emphysematous changes in the lungs. No evidence of active pulmonary disease. Aortic atherosclerosis. Electronically Signed   By: Lucienne Capers M.D.   On: 05/23/2017 19:20    Assessment and Plan:   1. Chest pain - mild troponin elevation peak 0.16 trending down, EKG with inferior and lateral ST depressions that appear to be new - reports symptoms of her chronic palpitations, but also an episode of exertinal chest pain 4 weeks ago, and also episode of epigastric/chest squeezing/tightness Sunday night that she had never had before, though picture somewhat muddled by chronic GERD symptoms - did have SBPs up to 180s on admission which may have played a role.  - given her symptoms, enzymes, EKG changes concern for possible underlying ACS - f/u repeat ekg, echo today. If recurrent symptoms will start heparin gtt. Likely plan for [facility] tomorrow pending her echo results. Lipid panel ordered, if CAD confirmed will need high dose statin, beta blocker, ACE-I  2.Symptomatic PACs/PVCs - has been on flecanide several years, if CAD confirmed will need to d/c   630pm Addendum We will plan for cath to definitively evaluate for possible CAD as the etiolog of her symptoms   I have reviewed the risks, indications, and alternatives to cardiac catheterization, possible angioplasty, and stenting with the patient today.  Risks include but are not limited to bleeding, infection, vascular injury, stroke, myocardial infection, arrhythmia, kidney injury, radiation-related injury in the case of prolonged fluoroscopy use, emergency cardiac surgery, and death. The patient understands the risks of serious complication is 1-2 in 9983 with diagnostic cardiac cath and 1-2% or less with angioplasty/stenting.    For questions or updates, please contact [place] Please consult [email or web address] for contact info under Cardiology/STEMI.   Merrily Pew, MD  05/24/2017 9:35 AM

## 2017-05-24 NOTE — Progress Notes (Signed)
*  PRELIMINARY RESULTS* Echocardiogram 2D Echocardiogram has been performed.  [name] [name] 05/24/2017, 2:26 PM

## 2017-05-24 NOTE — Progress Notes (Signed)
PROGRESS NOTE    [name] [name]  LYY:[id number] DOB: Jun 17, 1929 DOA: 05/23/2017 PCP: Evans Lance, MD   Outpatient Specialists:     Brief Narrative:  [name] [name]  is a 84 y.o. female, w hx of esophageal stricture, arrythmia, anxiety apparently c/o feeling palpitations in her left chest and epigastric area for the past 3 weeks. Slight dyspnea.  Pt denies fever, chills, cp, n/v, abd pain, diarrhea, brbpr, black stool.      Assessment & Plan:   Principal Problem:   Palpitation Active Problems:   Chest pain   Palpitations   Chest pain -echo pending -CE trending down -tele -possible LHC in AM -may need heparin gtt  Symptomatic PAC/PVCs -on flecanide-- per cards   DVT prophylaxis:  Lovenox   Code Status: Full Code   Family Communication:   Disposition Plan:    Consultants:   cards     Subjective: No current chest pain  Objective: Vitals:   05/23/17 2300 05/24/17 0020 05/24/17 0348 05/24/17 0741  BP: (!) 151/78 (!) 157/85 (!) 119/59 135/77  Pulse: 83 88 68 81  Resp: 12 16 18 18   Temp:   97.6 F (36.4 C) 97.8 F (36.6 C)  TempSrc:   Oral Oral  SpO2: 98% 97% 100% 99%  Weight:  65.6 kg (144 lb 10 oz)    Height:        Intake/Output Summary (Last 24 hours) at 05/24/2017 1345 Last data filed at 05/24/2017 1340 Gross per 24 hour  Intake 420 ml  Output -  Net 420 ml   Filed Weights   05/23/17 1825 05/24/17 0020  Weight: 63.5 kg (140 lb) 65.6 kg (144 lb 10 oz)    Examination:  General exam: Appears calm and comfortable  Respiratory system: Clear to auscultation. Respiratory effort normal. Cardiovascular system: S1 & S2 heard, RRR. No JVD, murmurs, rubs, gallops or clicks. No pedal edema. Gastrointestinal system: Abdomen is nondistended, soft and nontender. No organomegaly or masses felt. Normal bowel sounds heard. Central nervous system: Alert and oriented. No focal neurological deficits. Extremities: Symmetric 5 x 5 power. Skin: No rashes,  lesions or ulcers Psychiatry: Judgement and insight appear normal. Mood & affect appropriate.     Data Reviewed: I have personally reviewed following labs and imaging studies  CBC: Recent Labs  Lab 05/23/17 1936 05/24/17 0134  WBC 4.4 4.4  HGB 13.5 12.9  HCT 40.0 39.2  MCV 94.1 94.7  PLT 256 127   Basic Metabolic Panel: Recent Labs  Lab 05/23/17 1936 05/24/17 0134  NA 139 140  K 3.5 3.4*  CL 104 106  CO2 23 26  GLUCOSE 112* 100*  BUN 14 12  CREATININE 0.67 0.78  CALCIUM 9.6 9.3   GFR: Estimated Creatinine Clearance: 48.1 mL/min (by C-G formula based on SCr of 0.78 mg/dL). Liver Function Tests: Recent Labs  Lab 05/24/17 0134  AST 21  ALT 10*  ALKPHOS 66  BILITOT 0.8  PROT 6.6  ALBUMIN 3.8   No results for input(s): LIPASE, AMYLASE in the last 168 hours. No results for input(s): AMMONIA in the last 168 hours. Coagulation Profile: No results for input(s): INR, PROTIME in the last 168 hours. Cardiac Enzymes: Recent Labs  Lab 05/24/17 0134 05/24/17 0718  TROPONINI 0.16* 0.10*   BNP (last 3 results) No results for input(s): PROBNP in the last 8760 hours. HbA1C: No results for input(s): HGBA1C in the last 72 hours. CBG: No results for input(s): GLUCAP in the last 168 hours. Lipid  Profile: No results for input(s): CHOL, HDL, LDLCALC, TRIG, CHOLHDL, LDLDIRECT in the last 72 hours. Thyroid Function Tests: Recent Labs    05/24/17 0134  TSH 5.072*  FREET4 0.92   Anemia Panel: No results for input(s): VITAMINB12, FOLATE, FERRITIN, TIBC, IRON, RETICCTPCT in the last 72 hours. Urine analysis:    Component Value Date/Time   COLORURINE COLORLESS (A) 05/23/2017 1936   APPEARANCEUR CLEAR 05/23/2017 1936   LABSPEC 1.002 (L) 05/23/2017 1936   PHURINE 7.0 05/23/2017 1936   GLUCOSEU NEGATIVE 05/23/2017 1936   GLUCOSEU NEGATIVE 10/31/2013 1403   HGBUR SMALL (A) 05/23/2017 1936   BILIRUBINUR NEGATIVE 05/23/2017 1936   BILIRUBINUR n 08/07/2014 [place] 05/23/2017 1936   PROTEINUR NEGATIVE 05/23/2017 1936   UROBILINOGEN 0.2 01/29/2017 1225   NITRITE NEGATIVE 05/23/2017 1936   LEUKOCYTESUR SMALL (A) 05/23/2017 1936     )No results found for this or any previous visit (from the past 240 hour(s)).    Anti-infectives (From admission, onward)   None       Radiology Studies: Dg Chest 2 View  Result Date: 05/23/2017 CLINICAL DATA:  High blood pressure for couple of days. Chest tightness, shortness of breath, and palpitations since 1530 hours today. EXAM: CHEST  2 VIEW COMPARISON:  03/13/2016 FINDINGS: Emphysematous changes and scattered fibrosis in the lungs. Heart size and pulmonary vascularity are normal. No airspace disease or consolidation in the lungs. No blunting of costophrenic angles. No pneumothorax. Mediastinal contours appear intact. Calcification of the aorta. IMPRESSION: Emphysematous changes in the lungs. No evidence of active pulmonary disease. Aortic atherosclerosis. Electronically Signed   By: Lucienne Capers M.D.   On: 05/23/2017 19:20        Scheduled Meds: . aspirin  325 mg Oral Daily  . enoxaparin (LOVENOX) injection  40 mg Subcutaneous Q24H  . feeding supplement (ENSURE ENLIVE)  237 mL Oral BID BM  . flecainide  50 mg Oral BID  . pantoprazole  40 mg Oral Daily  . sodium chloride flush  3 mL Intravenous Q12H   Continuous Infusions: . sodium chloride       LOS: 0 days    Time spent: 25 min    Geradine Girt, DO [facility] Pager [phone]  If 7PM-7AM, please contact night-coverage [email or web address] Password [email or web address] 05/24/2017, 1:45 PM

## 2017-05-24 NOTE — Progress Notes (Signed)
Patient's troponin is 0.16 now. Patient denies pain and rests comfortably. K. Schorr is paged about increased troponin.

## 2017-05-25 ENCOUNTER — Ambulatory Visit: Payer: Medicare Other | Admitting: Physician Assistant

## 2017-05-25 ENCOUNTER — Encounter (HOSPITAL_COMMUNITY)
Admission: EM | Disposition: A | Discharge status: Home / Self Care | Payer: Self-pay | Source: Home / Self Care | Attending: Internal Medicine

## 2017-05-25 DIAGNOSIS — R079 Chest pain, unspecified: Secondary | ICD-10-CM

## 2017-05-25 DIAGNOSIS — R0789 Other chest pain: Secondary | ICD-10-CM

## 2017-05-25 DIAGNOSIS — R748 Abnormal levels of other serum enzymes: Secondary | ICD-10-CM

## 2017-05-25 DIAGNOSIS — I249 Acute ischemic heart disease, unspecified: Secondary | ICD-10-CM

## 2017-05-25 DIAGNOSIS — R778 Other specified abnormalities of plasma proteins: Secondary | ICD-10-CM

## 2017-05-25 DIAGNOSIS — R7989 Other specified abnormal findings of blood chemistry: Secondary | ICD-10-CM

## 2017-05-25 HISTORY — PX: LEFT HEART CATH AND CORONARY ANGIOGRAPHY: CATH118249

## 2017-05-25 LAB — LIPID PANEL
Cholesterol: 167 mg/dL (ref 0–200)
HDL: 48 mg/dL (ref >40)
LDL Cholesterol: 101 mg/dL — ABNORMAL HIGH (ref 0–99)
Total CHOL/HDL Ratio: 3.5 RATIO
Triglycerides: 88 mg/dL (ref <150)
VLDL: 18 mg/dL (ref 0–40)

## 2017-05-25 LAB — BASIC METABOLIC PANEL
Anion gap: 9 (ref 5–15)
BUN: 12 mg/dL (ref 6–20)
CO2: 23 mmol/L (ref 22–32)
Calcium: 9.1 mg/dL (ref 8.9–10.3)
Chloride: 108 mmol/L (ref 101–111)
Creatinine, Ser: 0.68 mg/dL (ref 0.44–1.00)
GFR calc Af Amer: >60 mL/min (ref >60)
GFR calc non Af Amer: >60 mL/min (ref >60)
Glucose, Bld: 92 mg/dL (ref 65–99)
Potassium: 4.2 mmol/L (ref 3.5–5.1)
Sodium: 140 mmol/L (ref 135–145)

## 2017-05-25 LAB — CBC
HCT: 37.8 % (ref 36.0–46.0)
Hemoglobin: 12.3 g/dL (ref 12.0–15.0)
MCH: 31.1 pg (ref 26.0–34.0)
MCHC: 32.5 g/dL (ref 30.0–36.0)
MCV: 95.7 fL (ref 78.0–100.0)
Platelets: 235 10*3/uL (ref 150–400)
RBC: 3.95 MIL/uL (ref 3.87–5.11)
RDW: 13.8 % (ref 11.5–15.5)
WBC: 3.4 10*3/uL — ABNORMAL LOW (ref 4.0–10.5)

## 2017-05-25 LAB — PROTIME-INR
INR: 1.1
Prothrombin Time: 14.1 seconds (ref 11.4–15.2)

## 2017-05-25 SURGERY — LEFT HEART CATH AND CORONARY ANGIOGRAPHY
Anesthesia: LOCAL

## 2017-05-25 MED ORDER — SODIUM CHLORIDE 0.9% FLUSH
3.0000 mL | Freq: Two times a day (BID) | INTRAVENOUS | Status: DC
  Administered 2017-05-25: 3 mL via INTRAVENOUS

## 2017-05-25 MED ORDER — SODIUM CHLORIDE 0.9 % IV SOLN: 250.0000 mL | INTRAVENOUS | Status: DC | PRN

## 2017-05-25 MED ORDER — MIDAZOLAM HCL 2 MG/2ML IJ SOLN
INTRAMUSCULAR | Status: DC | PRN
  Administered 2017-05-25: 1 mg via INTRAVENOUS

## 2017-05-25 MED ORDER — ACETAMINOPHEN 325 MG PO TABS
650.0000 mg | ORAL_TABLET | ORAL | Status: DC | PRN
  Administered 2017-05-25: 650 mg via ORAL
  Filled 2017-05-25: qty 2

## 2017-05-25 MED ORDER — SODIUM CHLORIDE 0.9 % WEIGHT BASED INFUSION
3.0000 mL/kg/h | INTRAVENOUS | Status: DC
  Administered 2017-05-25: 3 mL/kg/h via INTRAVENOUS

## 2017-05-25 MED ORDER — SODIUM CHLORIDE 0.9 % WEIGHT BASED INFUSION
1.0000 mL/kg/h | INTRAVENOUS | Status: DC
  Administered 2017-05-25: 1 mL/kg/h via INTRAVENOUS

## 2017-05-25 MED ORDER — HEPARIN (PORCINE) IN NACL 2-0.9 UNIT/ML-% IJ SOLN
INTRAMUSCULAR | Status: AC
  Filled 2017-05-25: qty 1000

## 2017-05-25 MED ORDER — LIDOCAINE HCL (PF) 1 % IJ SOLN
INTRAMUSCULAR | Status: AC
  Filled 2017-05-25: qty 30

## 2017-05-25 MED ORDER — IOPAMIDOL (ISOVUE-370) INJECTION 76%
INTRAVENOUS | Status: DC | PRN
  Administered 2017-05-25: 30 mL via INTRA_ARTERIAL

## 2017-05-25 MED ORDER — HEPARIN (PORCINE) IN NACL 2-0.9 UNIT/ML-% IJ SOLN
INTRAMUSCULAR | Status: AC | PRN
  Administered 2017-05-25: 1000 mL

## 2017-05-25 MED ORDER — HEPARIN SODIUM (PORCINE) 1000 UNIT/ML IJ SOLN
INTRAMUSCULAR | Status: AC
  Filled 2017-05-25: qty 1

## 2017-05-25 MED ORDER — SODIUM CHLORIDE 0.9% FLUSH: 3.0000 mL | INTRAVENOUS | Status: DC | PRN

## 2017-05-25 MED ORDER — FENTANYL CITRATE (PF) 100 MCG/2ML IJ SOLN
INTRAMUSCULAR | Status: DC | PRN
  Administered 2017-05-25: 25 ug via INTRAVENOUS

## 2017-05-25 MED ORDER — HEPARIN SODIUM (PORCINE) 1000 UNIT/ML IJ SOLN
INTRAMUSCULAR | Status: DC | PRN
  Administered 2017-05-25: 3500 [IU] via INTRAVENOUS

## 2017-05-25 MED ORDER — FENTANYL CITRATE (PF) 100 MCG/2ML IJ SOLN
INTRAMUSCULAR | Status: AC
  Filled 2017-05-25: qty 2

## 2017-05-25 MED ORDER — VERAPAMIL HCL 2.5 MG/ML IV SOLN
INTRAVENOUS | Status: AC
  Filled 2017-05-25: qty 2

## 2017-05-25 MED ORDER — LORAZEPAM 1 MG PO TABS
1.0000 mg | ORAL_TABLET | Freq: Once | ORAL | Status: AC
  Administered 2017-05-25: 1 mg via ORAL
  Filled 2017-05-25: qty 1

## 2017-05-25 MED ORDER — SODIUM CHLORIDE 0.9 % IV SOLN: INTRAVENOUS | Status: AC

## 2017-05-25 MED ORDER — VERAPAMIL HCL 2.5 MG/ML IV SOLN
INTRAVENOUS | Status: DC | PRN
  Administered 2017-05-25: 10 mL via INTRA_ARTERIAL

## 2017-05-25 MED ORDER — LIDOCAINE HCL (PF) 1 % IJ SOLN
INTRAMUSCULAR | Status: DC | PRN
  Administered 2017-05-25: 2 mL

## 2017-05-25 MED ORDER — IOPAMIDOL (ISOVUE-370) INJECTION 76%
INTRAVENOUS | Status: AC
  Filled 2017-05-25: qty 50

## 2017-05-25 MED ORDER — ASPIRIN 81 MG PO CHEW
81.0000 mg | CHEWABLE_TABLET | ORAL | Status: AC
  Administered 2017-05-25: 81 mg via ORAL
  Filled 2017-05-25: qty 1

## 2017-05-25 MED ORDER — MIDAZOLAM HCL 2 MG/2ML IJ SOLN
INTRAMUSCULAR | Status: AC
  Filled 2017-05-25: qty 2

## 2017-05-25 MED ORDER — SODIUM CHLORIDE 0.9% FLUSH
3.0000 mL | Freq: Two times a day (BID) | INTRAVENOUS | Status: DC
  Administered 2017-05-26: 3 mL via INTRAVENOUS

## 2017-05-25 MED ORDER — ONDANSETRON HCL 4 MG/2ML IJ SOLN: 4.0000 mg | Freq: Four times a day (QID) | INTRAMUSCULAR | Status: DC | PRN

## 2017-05-25 SURGICAL SUPPLY — 11 items
CATH 5FR JL3.5 JR4 ANG PIG MP (CATHETERS) ×2 IMPLANT
COVER PRB 48X5XTLSCP FOLD TPE (BAG) ×1 IMPLANT
COVER PROBE 5X48 (BAG) ×1
DEVICE RAD COMP TR BAND LRG (VASCULAR PRODUCTS) ×2 IMPLANT
GLIDESHEATH SLEND SS 6F .021 (SHEATH) ×2 IMPLANT
GUIDEWIRE INQWIRE 1.5J.035X260 (WIRE) ×1 IMPLANT
INQWIRE 1.5J .035X260CM (WIRE) ×2
KIT HEART LEFT (KITS) ×2 IMPLANT
PACK CARDIAC CATHETERIZATION (CUSTOM PROCEDURE TRAY) ×2 IMPLANT
TRANSDUCER W/STOPCOCK (MISCELLANEOUS) ×2 IMPLANT
TUBING CIL FLEX 10 FLL-RA (TUBING) ×2 IMPLANT

## 2017-05-25 NOTE — Progress Notes (Addendum)
Progress Note  Patient Name: [name] [name] Date of Encounter: 05/25/2017  Primary Cardiologist: Lovena Le  Subjective   Very anxious this morning. Unsure about whether she wants cath done.   Inpatient Medications    Scheduled Meds: . aspirin  325 mg Oral Daily  . enoxaparin (LOVENOX) injection  40 mg Subcutaneous Q24H  . feeding supplement (ENSURE ENLIVE)  237 mL Oral BID BM  . flecainide  50 mg Oral BID  . pantoprazole  40 mg Oral Daily  . sodium chloride flush  3 mL Intravenous Q12H  . sodium chloride flush  3 mL Intravenous Q12H   Continuous Infusions: . sodium chloride    . sodium chloride    . [START ON 05/26/2017] sodium chloride 3 mL/kg/hr (05/25/17 0957)   Followed by  . [START ON 05/26/2017] sodium chloride 1 mL/kg/hr (05/25/17 0628)   PRN Meds: sodium chloride, sodium chloride, acetaminophen **OR** acetaminophen, cholecalciferol, nitroGLYCERIN, sodium chloride flush, sodium chloride flush   Vital Signs    Vitals:   05/24/17 0741 05/24/17 1418 05/24/17 1957 05/25/17 0439  BP: 135/77 129/68 105/60 113/66  Pulse: 81 70 83 78  Resp: 18 18 18 18   Temp: 97.8 F (36.6 C) 97.9 F (36.6 C) 98.6 F (37 C) 98.1 F (36.7 C)  TempSrc: Oral Oral Oral Oral  SpO2: 99% 97% 99% 95%  Weight:    143 lb 14.4 oz (65.3 kg)  Height:        Intake/Output Summary (Last 24 hours) at 05/25/2017 1123 Last data filed at 05/25/2017 0933 Gross per 24 hour  Intake 629.52 ml  Output 1600 ml  Net -970.48 ml   Filed Weights   05/23/17 1825 05/24/17 0020 05/25/17 0439  Weight: 140 lb (63.5 kg) 144 lb 10 oz (65.6 kg) 143 lb 14.4 oz (65.3 kg)    Telemetry    SR - Personally Reviewed  Physical Exam   General: Pleasant older W female appearing in no acute distress. Anxious Head: Normocephalic, atraumatic.  Neck: Supple without bruits, JVD. Lungs:  Resp regular and unlabored, CTA. Heart: RRR, S1, S2, no S3, S4, or murmur; no rub. Abdomen: Soft, non-tender, non-distended with normoactive  bowel sounds. No hepatomegaly. No rebound/guarding. No obvious abdominal masses. Extremities: No clubbing, cyanosis, edema. Distal pedal pulses are 2+ bilaterally. Neuro: Alert and oriented X 3. Moves all extremities spontaneously. Psych: Normal affect.  Labs    Chemistry Recent Labs  Lab 05/23/17 1936 05/24/17 0134 05/25/17 0753  NA 139 140 140  K 3.5 3.4* 4.2  CL 104 106 108  CO2 23 26 23   GLUCOSE 112* 100* 92  BUN 14 12 12   CREATININE 0.67 0.78 0.68  CALCIUM 9.6 9.3 9.1  PROT  --  6.6  --   ALBUMIN  --  3.8  --   AST  --  21  --   ALT  --  10*  --   ALKPHOS  --  66  --   BILITOT  --  0.8  --   GFRNONAA >60 >60 >60  GFRAA >60 >60 >60  ANIONGAP 12 8 9      Hematology Recent Labs  Lab 05/23/17 1936 05/24/17 0134 05/25/17 0753  WBC 4.4 4.4 3.4*  RBC 4.25 4.14 3.95  HGB 13.5 12.9 12.3  HCT 40.0 39.2 37.8  MCV 94.1 94.7 95.7  MCH 31.8 31.2 31.1  MCHC 33.8 32.9 32.5  RDW 13.5 13.5 13.8  PLT 256 246 235    Cardiac Enzymes Recent Labs  Lab 05/24/17 0134 05/24/17 0718 05/24/17 1232  TROPONINI 0.16* 0.10* 0.07*    Recent Labs  Lab 05/23/17 1948  TROPIPOC 0.02     BNPNo results for input(s): BNP, PROBNP in the last 168 hours.   DDimer No results for input(s): DDIMER in the last 168 hours.    Radiology    Dg Chest 2 View  Result Date: 05/23/2017 CLINICAL DATA:  High blood pressure for couple of days. Chest tightness, shortness of breath, and palpitations since 1530 hours today. EXAM: CHEST  2 VIEW COMPARISON:  03/13/2016 FINDINGS: Emphysematous changes and scattered fibrosis in the lungs. Heart size and pulmonary vascularity are normal. No airspace disease or consolidation in the lungs. No blunting of costophrenic angles. No pneumothorax. Mediastinal contours appear intact. Calcification of the aorta. IMPRESSION: Emphysematous changes in the lungs. No evidence of active pulmonary disease. Aortic atherosclerosis. Electronically Signed   By: Lucienne Capers M.D.   On: 05/23/2017 19:20    Cardiac Studies   TTE: 05/24/16  Study Conclusions  - Left ventricle: The cavity size was normal. Systolic function was   vigorous. The estimated ejection fraction was in the range of 65%   to 70%. Wall motion was normal; there were no regional wall   motion abnormalities. Doppler parameters are consistent with   abnormal left ventricular relaxation (grade 1 diastolic   dysfunction). There was no evidence of elevated ventricular   filling pressure by Doppler parameters. - Aortic valve: There was no regurgitation. - Aortic root: The aortic root was normal in size. - Mitral valve: There was mild regurgitation. - Right ventricle: The cavity size was normal. Wall thickness was   normal. Systolic function was normal. - Right atrium: The atrium was normal in size. - Tricuspid valve: There was trivial regurgitation. - Pulmonary arteries: Systolic pressure was within the normal   range. - Inferior vena cava: The vessel was normal in size. - Pericardium, extracardiac: There was no pericardial effusion.  Patient Profile     84 y.o. female with a hx of atrial and ventricular ectopy who is being seen today for the evaluation of elevated troponin and chest pain.  Assessment & Plan    1. Chest pain with positive troponin --chest pain with high risk for acute coronary syndrome: Reports intermittent episodes of exertional chest pain over the past several days prior to admission. Troponin peaked at 0.16. EKG did show mild ST depression in lateral leads on admission which has now resolved. Echo with normal EF and no WMA. Planned for cath today but patient is anxious about this. Attempted to answer all questions but will have MD follow up with plan.   2. Symptomatic PVCs: On Flecainide for this. Cath would be helpful to determine if this is a appropriate to continue.   NP time with patient 30 minutes   Signed, Reino Bellis, NP  05/25/2017, 11:23 AM    Pager # [phone]   For questions or updates, please contact [place] Please consult [email or web address] for contact info under Cardiology/STEMI.   I have seen, examined and evaluated the patient this AM along with Reino Bellis, NP-C on AM rounds   After reviewing all the available data and chart, we discussed the patients laboratory, study & physical findings as well as symptoms in detail. I agree with her findings, examination as well as impression recommendations as per our discussion.    Attending adjustments noted in italics.   [name] [name] is a very pleasant 84 year old woman who presented with  symptoms that were concerning for acute coronary syndrome with a chest/epigastric tightness across the lower chest.  This was in the setting of increased stress and anxiety.  She had what appears to be dynamic subtle ST segment changes in the lateral leads which have resolved now.  She also had mild troponin elevation.  These 2 objective findings are concerning for possible acute coronary syndrome. I agree with Dr. Harl Bowie his initial assessment and recommendations for cardiac catheterization.  Concern would be that she has a significant lesion and this is simply a "warning shot ". -Presence of mild troponin elevation along with dynamic EKG changes in the setting of prolonged chest pain would be concerning for unstable angina/acute coronary syndrome.  I spent about 35 minutes with the patient discussing the risks, benefits, alternatives and indications of the procedure.  We discussed my thought process behind considering proceeding with cardiac catheterization.  She understands the  logic behind it and agrees to proceed.  This will allow [place] also to determine if she is safe to continue flecainide.  Total NP/MD time with patient including contact 60-65 minutes.   Glenetta Hew, M.D., M.S. Interventional Cardiologist   Pager # [phone] Phone # [phone] [address] [place], [place] [address] ]

## 2017-05-25 NOTE — H&P (View-Only) (Signed)
Progress Note  Patient Name: [name] [name] Date of Encounter: 05/25/2017  Primary Cardiologist: Lovena Le  Subjective   Very anxious this morning. Unsure about whether she wants cath done.   Inpatient Medications    Scheduled Meds: . aspirin  325 mg Oral Daily  . enoxaparin (LOVENOX) injection  40 mg Subcutaneous Q24H  . feeding supplement (ENSURE ENLIVE)  237 mL Oral BID BM  . flecainide  50 mg Oral BID  . pantoprazole  40 mg Oral Daily  . sodium chloride flush  3 mL Intravenous Q12H  . sodium chloride flush  3 mL Intravenous Q12H   Continuous Infusions: . sodium chloride    . sodium chloride    . [START ON 05/26/2017] sodium chloride 3 mL/kg/hr (05/25/17 0957)   Followed by  . [START ON 05/26/2017] sodium chloride 1 mL/kg/hr (05/25/17 0628)   PRN Meds: sodium chloride, sodium chloride, acetaminophen **OR** acetaminophen, cholecalciferol, nitroGLYCERIN, sodium chloride flush, sodium chloride flush   Vital Signs    Vitals:   05/24/17 0741 05/24/17 1418 05/24/17 1957 05/25/17 0439  BP: 135/77 129/68 105/60 113/66  Pulse: 81 70 83 78  Resp: 18 18 18 18   Temp: 97.8 F (36.6 C) 97.9 F (36.6 C) 98.6 F (37 C) 98.1 F (36.7 C)  TempSrc: Oral Oral Oral Oral  SpO2: 99% 97% 99% 95%  Weight:    143 lb 14.4 oz (65.3 kg)  Height:        Intake/Output Summary (Last 24 hours) at 05/25/2017 1123 Last data filed at 05/25/2017 0933 Gross per 24 hour  Intake 629.52 ml  Output 1600 ml  Net -970.48 ml   Filed Weights   05/23/17 1825 05/24/17 0020 05/25/17 0439  Weight: 140 lb (63.5 kg) 144 lb 10 oz (65.6 kg) 143 lb 14.4 oz (65.3 kg)    Telemetry    SR - Personally Reviewed  Physical Exam   General: Pleasant older W female appearing in no acute distress. Anxious Head: Normocephalic, atraumatic.  Neck: Supple without bruits, JVD. Lungs:  Resp regular and unlabored, CTA. Heart: RRR, S1, S2, no S3, S4, or murmur; no rub. Abdomen: Soft, non-tender, non-distended with normoactive  bowel sounds. No hepatomegaly. No rebound/guarding. No obvious abdominal masses. Extremities: No clubbing, cyanosis, edema. Distal pedal pulses are 2+ bilaterally. Neuro: Alert and oriented X 3. Moves all extremities spontaneously. Psych: Normal affect.  Labs    Chemistry Recent Labs  Lab 05/23/17 1936 05/24/17 0134 05/25/17 0753  NA 139 140 140  K 3.5 3.4* 4.2  CL 104 106 108  CO2 23 26 23   GLUCOSE 112* 100* 92  BUN 14 12 12   CREATININE 0.67 0.78 0.68  CALCIUM 9.6 9.3 9.1  PROT  --  6.6  --   ALBUMIN  --  3.8  --   AST  --  21  --   ALT  --  10*  --   ALKPHOS  --  66  --   BILITOT  --  0.8  --   GFRNONAA >60 >60 >60  GFRAA >60 >60 >60  ANIONGAP 12 8 9      Hematology Recent Labs  Lab 05/23/17 1936 05/24/17 0134 05/25/17 0753  WBC 4.4 4.4 3.4*  RBC 4.25 4.14 3.95  HGB 13.5 12.9 12.3  HCT 40.0 39.2 37.8  MCV 94.1 94.7 95.7  MCH 31.8 31.2 31.1  MCHC 33.8 32.9 32.5  RDW 13.5 13.5 13.8  PLT 256 246 235    Cardiac Enzymes Recent Labs  Lab 05/24/17 0134 05/24/17 0718 05/24/17 1232  TROPONINI 0.16* 0.10* 0.07*    Recent Labs  Lab 05/23/17 1948  TROPIPOC 0.02     BNPNo results for input(s): BNP, PROBNP in the last 168 hours.   DDimer No results for input(s): DDIMER in the last 168 hours.    Radiology    Dg Chest 2 View  Result Date: 05/23/2017 CLINICAL DATA:  High blood pressure for couple of days. Chest tightness, shortness of breath, and palpitations since 1530 hours today. EXAM: CHEST  2 VIEW COMPARISON:  03/13/2016 FINDINGS: Emphysematous changes and scattered fibrosis in the lungs. Heart size and pulmonary vascularity are normal. No airspace disease or consolidation in the lungs. No blunting of costophrenic angles. No pneumothorax. Mediastinal contours appear intact. Calcification of the aorta. IMPRESSION: Emphysematous changes in the lungs. No evidence of active pulmonary disease. Aortic atherosclerosis. Electronically Signed   By: Lucienne Capers M.D.   On: 05/23/2017 19:20    Cardiac Studies   TTE: 05/24/16  Study Conclusions  - Left ventricle: The cavity size was normal. Systolic function was   vigorous. The estimated ejection fraction was in the range of 65%   to 70%. Wall motion was normal; there were no regional wall   motion abnormalities. Doppler parameters are consistent with   abnormal left ventricular relaxation (grade 1 diastolic   dysfunction). There was no evidence of elevated ventricular   filling pressure by Doppler parameters. - Aortic valve: There was no regurgitation. - Aortic root: The aortic root was normal in size. - Mitral valve: There was mild regurgitation. - Right ventricle: The cavity size was normal. Wall thickness was   normal. Systolic function was normal. - Right atrium: The atrium was normal in size. - Tricuspid valve: There was trivial regurgitation. - Pulmonary arteries: Systolic pressure was within the normal   range. - Inferior vena cava: The vessel was normal in size. - Pericardium, extracardiac: There was no pericardial effusion.  Patient Profile     84 y.o. female with a hx of atrial and ventricular ectopy who is being seen today for the evaluation of elevated troponin and chest pain.  Assessment & Plan    1. Chest pain with positive troponin --chest pain with high risk for acute coronary syndrome: Reports intermittent episodes of exertional chest pain over the past several days prior to admission. Troponin peaked at 0.16. EKG did show mild ST depression in lateral leads on admission which has now resolved. Echo with normal EF and no WMA. Planned for cath today but patient is anxious about this. Attempted to answer all questions but will have MD follow up with plan.   2. Symptomatic PVCs: On Flecainide for this. Cath would be helpful to determine if this is a appropriate to continue.   NP time with patient 30 minutes   Signed, Reino Bellis, NP  05/25/2017, 11:23 AM    Pager # [phone]   For questions or updates, please contact [place] Please consult [email or web address] for contact info under Cardiology/STEMI.   I have seen, examined and evaluated the patient this AM along with Reino Bellis, NP-C on AM rounds   After reviewing all the available data and chart, we discussed the patients laboratory, study & physical findings as well as symptoms in detail. I agree with her findings, examination as well as impression recommendations as per our discussion.    Attending adjustments noted in italics.   [name] is a very pleasant 84 year old woman who presented with  symptoms that were concerning for acute coronary syndrome with a chest/epigastric tightness across the lower chest.  This was in the setting of increased stress and anxiety.  She had what appears to be dynamic subtle ST segment changes in the lateral leads which have resolved now.  She also had mild troponin elevation.  These 2 objective findings are concerning for possible acute coronary syndrome. I agree with Dr. Harl Bowie his initial assessment and recommendations for cardiac catheterization.  Concern would be that she has a significant lesion and this is simply a "warning shot ". -Presence of mild troponin elevation along with dynamic EKG changes in the setting of prolonged chest pain would be concerning for unstable angina/acute coronary syndrome.  I spent about 35 minutes with the patient discussing the risks, benefits, alternatives and indications of the procedure.  We discussed my thought process behind considering proceeding with cardiac catheterization.  She understands the  logic behind it and agrees to proceed.  This will allow [place] also to determine if she is safe to continue flecainide.  Total NP/MD time with patient including contact 60-65 minutes.   Glenetta Hew, M.D., M.S. Interventional Cardiologist   Pager # [phone] Phone # [phone] [address] [place], [place] [address] ]

## 2017-05-25 NOTE — Progress Notes (Signed)
PROGRESS NOTE    [name] [name] Mood  ZOX:[id number] DOB: 07-27-1929 DOA: 05/23/2017 PCP: Evans Lance, MD   Outpatient Specialists:     Brief Narrative:  [name] [name]  is a 84 y.o. female, w hx of esophageal stricture, arrythmia, anxiety apparently c/o feeling palpitations in her left chest and epigastric area for the past 3 weeks. Slight dyspnea.  Pt denies fever, chills, cp, n/v, abd pain, diarrhea, brbpr, black stool.      Assessment & Plan:   Principal Problem:   Palpitation Active Problems:   Chest pain   Palpitations   Chest pain -echo: Left ventricle: The cavity size was normal. Systolic function was   vigorous. The estimated ejection fraction was in the range of 65%   to 70%. Wall motion was normal; there were no regional wall   motion abnormalities. Doppler parameters are consistent with   abnormal left ventricular relaxation (grade 1 diastolic   dysfunction). There was no evidence of elevated ventricular   filling pressure by Doppler parameters. -CE trending down -tele -now agreeable for LHC  Symptomatic PAC/PVCs -on flecanide-- per cards   DVT prophylaxis:  Lovenox   Code Status: Full Code   Family Communication:   Disposition Plan:    Consultants:   cards     Subjective: Nervous about cath  Objective: Vitals:   05/24/17 1418 05/24/17 1957 05/25/17 0439 05/25/17 1207  BP: 129/68 105/60 113/66 (!) 154/71  Pulse: 70 83 78 81  Resp: 18 18 18 20   Temp: 97.9 F (36.6 C) 98.6 F (37 C) 98.1 F (36.7 C) 98.2 F (36.8 C)  TempSrc: Oral Oral Oral Oral  SpO2: 97% 99% 95% 99%  Weight:   65.3 kg (143 lb 14.4 oz)   Height:        Intake/Output Summary (Last 24 hours) at 05/25/2017 1233 Last data filed at 05/25/2017 0933 Gross per 24 hour  Intake 629.52 ml  Output 1600 ml  Net -970.48 ml   Filed Weights   05/23/17 1825 05/24/17 0020 05/25/17 0439  Weight: 63.5 kg (140 lb) 65.6 kg (144 lb 10 oz) 65.3 kg (143 lb 14.4 oz)     Examination:  General exam: anxious Respiratory system: no wheezing Cardiovascular system: rrr Gastrointestinal system: +BS, soft Central nervous system: A+Ox3, no focal deficits    Data Reviewed: I have personally reviewed following labs and imaging studies  CBC: Recent Labs  Lab 05/23/17 1936 05/24/17 0134 05/25/17 0753  WBC 4.4 4.4 3.4*  HGB 13.5 12.9 12.3  HCT 40.0 39.2 37.8  MCV 94.1 94.7 95.7  PLT 256 246 811   Basic Metabolic Panel: Recent Labs  Lab 05/23/17 1936 05/24/17 0134 05/25/17 0753  NA 139 140 140  K 3.5 3.4* 4.2  CL 104 106 108  CO2 23 26 23   GLUCOSE 112* 100* 92  BUN 14 12 12   CREATININE 0.67 0.78 0.68  CALCIUM 9.6 9.3 9.1   GFR: Estimated Creatinine Clearance: 48.1 mL/min (by C-G formula based on SCr of 0.68 mg/dL). Liver Function Tests: Recent Labs  Lab 05/24/17 0134  AST 21  ALT 10*  ALKPHOS 66  BILITOT 0.8  PROT 6.6  ALBUMIN 3.8   No results for input(s): LIPASE, AMYLASE in the last 168 hours. No results for input(s): AMMONIA in the last 168 hours. Coagulation Profile: Recent Labs  Lab 05/25/17 0753  INR 1.10   Cardiac Enzymes: Recent Labs  Lab 05/24/17 0134 05/24/17 0718 05/24/17 1232  TROPONINI 0.16* 0.10* 0.07*  BNP (last 3 results) No results for input(s): PROBNP in the last 8760 hours. HbA1C: No results for input(s): HGBA1C in the last 72 hours. CBG: No results for input(s): GLUCAP in the last 168 hours. Lipid Profile: Recent Labs    05/25/17 0753  CHOL 167  HDL 48  LDLCALC 101*  TRIG 88  CHOLHDL 3.5   Thyroid Function Tests: Recent Labs    05/24/17 0134  TSH 5.072*  FREET4 0.92   Anemia Panel: No results for input(s): VITAMINB12, FOLATE, FERRITIN, TIBC, IRON, RETICCTPCT in the last 72 hours. Urine analysis:    Component Value Date/Time   COLORURINE COLORLESS (A) 05/23/2017 1936   APPEARANCEUR CLEAR 05/23/2017 1936   LABSPEC 1.002 (L) 05/23/2017 1936   PHURINE 7.0 05/23/2017 1936    GLUCOSEU NEGATIVE 05/23/2017 1936   GLUCOSEU NEGATIVE 10/31/2013 1403   HGBUR SMALL (A) 05/23/2017 1936   BILIRUBINUR NEGATIVE 05/23/2017 1936   BILIRUBINUR n 08/07/2014 [place] 05/23/2017 1936   PROTEINUR NEGATIVE 05/23/2017 1936   UROBILINOGEN 0.2 01/29/2017 1225   NITRITE NEGATIVE 05/23/2017 1936   LEUKOCYTESUR SMALL (A) 05/23/2017 1936     )No results found for this or any previous visit (from the past 240 hour(s)).    Anti-infectives (From admission, onward)   None       Radiology Studies: Dg Chest 2 View  Result Date: 05/23/2017 CLINICAL DATA:  High blood pressure for couple of days. Chest tightness, shortness of breath, and palpitations since 1530 hours today. EXAM: CHEST  2 VIEW COMPARISON:  03/13/2016 FINDINGS: Emphysematous changes and scattered fibrosis in the lungs. Heart size and pulmonary vascularity are normal. No airspace disease or consolidation in the lungs. No blunting of costophrenic angles. No pneumothorax. Mediastinal contours appear intact. Calcification of the aorta. IMPRESSION: Emphysematous changes in the lungs. No evidence of active pulmonary disease. Aortic atherosclerosis. Electronically Signed   By: Lucienne Capers M.D.   On: 05/23/2017 19:20        Scheduled Meds: . aspirin  325 mg Oral Daily  . enoxaparin (LOVENOX) injection  40 mg Subcutaneous Q24H  . feeding supplement (ENSURE ENLIVE)  237 mL Oral BID BM  . flecainide  50 mg Oral BID  . pantoprazole  40 mg Oral Daily  . sodium chloride flush  3 mL Intravenous Q12H  . sodium chloride flush  3 mL Intravenous Q12H   Continuous Infusions: . sodium chloride    . sodium chloride    . [START ON 05/26/2017] sodium chloride 3 mL/kg/hr (05/25/17 0957)   Followed by  . [START ON 05/26/2017] sodium chloride 1 mL/kg/hr (05/25/17 0628)     LOS: 1 day    Time spent: 25 min    Geradine Girt, DO [facility] Pager [phone]  If 7PM-7AM, please contact  night-coverage [email or web address] Password [facility] 05/25/2017, 12:33 PM

## 2017-05-25 NOTE — Progress Notes (Signed)
Initial Nutrition Assessment  DOCUMENTATION CODES:   Not applicable  INTERVENTION:   -Continue Ensure Enlive po BID, each supplement provides 350 kcal and 20 grams of protein -Discussed with pt ways to add additional plant-based protein sources in her diet  NUTRITION DIAGNOSIS:   Inadequate oral intake related to decreased appetite as evidenced by per patient/family report.  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   Per nursing notes, pt refusing cath today.  Spoke with pt at bedside, who reports she has "never been a big eater". Pt is very conscious about her weight and health status. She shares that she eats a vegetarian diet at home to prevent further health issues, such as DM. Pt typically consumes 2 meals per day (Breakfast: bowl of shredded wheat or raisin brain, raisins, and orange juice, Dinner: green beans with noodles). Pt reports that her intake has declined over the past 4 months, due to caring for her husband who has dementia. Pt started consuming 1-2 Ensure supplements PTA due to concern for weight loss and decreased intake.   Pt reports fair appetite currently. Noted meal completion 25-75%.   She estimates a 10# wt loss over the past 6 months, however, this is not consistent with documented wt hx. Pt has experienced a 3.4% wt loss over the past 3 months. Noted UBW around 150#.   Discussed with pt importance of good meal and supplement intake to promote healing, prevent further weight loss, and preserve lean body mass. Pt eats a very limited amount of protein sources (a litlte bit of milk and Ensure, does not eat eggs,  fish or beans). Discussed ways pt could include more plant based protein sources in her diet (ex lentils and peanut butter). Encouraged continued use of Ensure at home. Pt very appreciative of visit.   Labs reviewed.   NUTRITION - FOCUSED PHYSICAL EXAM:    Most Recent Value  Orbital Region  Mild depletion  Upper Arm Region  Mild depletion  Thoracic and Lumbar Region  No depletion  Buccal Region  No depletion  Temple Region  No depletion  Clavicle Bone Region  No depletion  Clavicle and Acromion Bone Region  No depletion  Scapular Bone Region  No depletion  Dorsal Hand  No depletion  Patellar Region  No depletion  Anterior Thigh Region  No depletion  Posterior Calf Region  Mild depletion  Edema (RD Assessment)  None  Hair  Reviewed  Eyes  Reviewed  Mouth  Reviewed  Skin  Reviewed  Nails  Reviewed       Diet Order:  Diet NPO time specified Except for: Sips with Meds  EDUCATION NEEDS:   Education needs have been addressed  Skin:  Skin Assessment: Reviewed RN Assessment  Last BM:  05/23/17  Height:   Ht Readings from Last 1 Encounters:  05/23/17 5\' 6"  (1.676 m)    Weight:   Wt Readings from Last 1 Encounters:  05/25/17 143 lb 14.4 oz (65.3 kg)    Ideal Body Weight:  59.1 kg  BMI:  Body mass index is 23.23 kg/m.  Estimated Nutritional Needs:  Kcal:  1500-1700  Protein:  70-85 grams  Fluid:  1.5-1.7 L    Carlinda Ohlson A. Jimmye Norman, RD, LDN, CDE Pager: [phone] After hours Pager: [phone]

## 2017-05-25 NOTE — Progress Notes (Addendum)
Pt expressed to this RN that she does not want Cath done today. Paged L. Mancel Bale, NP to make her aware.   Dr. Eliseo Squires also aware.

## 2017-05-25 NOTE — Interval H&P Note (Signed)
Cath Lab Visit (complete for each Cath Lab visit)  Clinical Evaluation Leading to the Procedure:   ACS: Yes.    Non-ACS:    Anginal Classification: CCS IV  Anti-ischemic medical therapy: Minimal Therapy (1 class of medications)  Non-Invasive Test Results: No non-invasive testing performed  Prior CABG: No previous CABG      History and Physical Interval Note:  05/25/2017 4:32 PM  [name] [name]  has presented today for surgery, with the diagnosis of NSTEMI  The various methods of treatment have been discussed with the patient and family. After consideration of risks, benefits and other options for treatment, the patient has consented to  Procedure(s): LEFT HEART CATH AND CORONARY ANGIOGRAPHY (N/A) as a surgical intervention .  The patient's history has been reviewed, patient examined, no change in status, stable for surgery.  I have reviewed the patient's chart and labs.  Questions were answered to the patient's satisfaction.     Larae Grooms

## 2017-05-25 NOTE — Progress Notes (Signed)
[name] [name] came to bedside, pt now agreeable to cath. Pt has signed consent. Placed in chart.

## 2017-05-25 NOTE — Progress Notes (Signed)
Pt now states she no longer wants cath, she wants to go home. Paged card NP, awaiting callback.

## 2017-05-25 NOTE — Progress Notes (Signed)
Pt back from cath lab. Site level 1 with mild bleeding from cath lab removing air. This RN given report that air was added back and band at 15cc. Small hematoma formed in cath lab, so pt has displaced tissue. Will monitor.

## 2017-05-26 ENCOUNTER — Encounter (HOSPITAL_COMMUNITY): Payer: Self-pay | Admitting: Interventional Cardiology

## 2017-05-26 MED ORDER — ASPIRIN EC 81 MG PO TBEC: 81.0000 mg | DELAYED_RELEASE_TABLET | Freq: Every day | ORAL | 0 refills | Status: DC

## 2017-05-26 MED ORDER — PANTOPRAZOLE SODIUM 40 MG PO TBEC: 40.0000 mg | DELAYED_RELEASE_TABLET | Freq: Every day | ORAL | 0 refills | Status: DC

## 2017-05-26 NOTE — Progress Notes (Signed)
Pt discharge instructions reviewed with pt. Pt verbalizes understanding. Pt belongings with pt. Pt is not in distress. Pt's son is driving her home. Pt will be discharged via wheelchair when son arrives.

## 2017-05-26 NOTE — Consult Note (Signed)
   [facility] CM Inpatient Consult   05/26/2017  [name] [name] [birth year] [id number]  Patient screened for potential [facility] services. Patient is in the [place] [facility] the [facility] [facility] services under patient's Medicare plan. Met with the patient at the bedside.  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  She states she only drives short distances because she is use to riding with her husband.  He has been advise not to drive "much', she states. She states she currently only sees her cardiologist and needs to find a new primary care provider closer to her.  Patient was given a brochure, 24 hour nurse advise line with contact information.   Please place a [facility] Care Management consult or for questions contact:   Natividad Brood, RN BSN [facility] Liaison  [phone] business mobile phone Toll free office [phone]

## 2017-05-26 NOTE — Discharge Summary (Signed)
Physician Discharge Summary  Crystle Carelli Romagnoli TOI:[id number] DOB: April 02, 1930 DOA: 05/23/2017  PCP: Evans Lance, MD  Admit date: 05/23/2017 Discharge date: 05/26/2017   Recommendations for Outpatient Follow-Up:   EP follow up for symptomatic PVCs  Discharge Diagnosis:   Principal Problem:   Palpitation Active Problems:   Chest pain   Palpitations   Chest tightness   Elevated troponin   Discharge disposition:  Home.  Discharge Condition: Improved.  Diet recommendation: Low sodium, heart healthy  Wound care: None.   History of Present Illness:   [name] [name]  is a 84 y.o. female, w hx of esophageal stricture, arrythmia, anxiety apparently c/o feeling palpitations in her left chest and epigastric area for the past 3 weeks. Slight dyspnea.  Pt denies fever, chills, cp, n/v, abd pain, diarrhea, brbpr, black stool.      Hospital Course by Problem:   Elevated troponins S/p cath Per cards: With essentially negative cardiac catheterization, this would argue against ACS.  Cannot explain the mild troponin elevation and EKG changes. Could possibly be related to coronary spasm, but no evidence on cath. -echo: Left ventricle: The cavity size was normal. Systolic function was vigorous. The estimated ejection fraction was in the range of 65% to 70%. Wall motion was normal; there were no regional wall motion abnormalities. Doppler parameters are consistent with abnormal left ventricular relaxation (grade 1 diastolic dysfunction). There was no evidence of elevated ventricular filling pressure by Doppler parameters        Medical Consultants:    cards   Discharge Exam:   Vitals:   05/25/17 1928 05/26/17 0518  BP: (!) 133/55 (!) 96/51  Pulse: 76 72  Resp: 18 18  Temp: 98 F (36.7 C) (!) 97.5 F (36.4 C)  SpO2: 96% 98%   Vitals:   05/25/17 1730 05/25/17 1745 05/25/17 1928 05/26/17 0518  BP: 118/79 (!) 145/91 (!) 133/55 (!) 96/51  Pulse:   76 72    Resp:   18 18  Temp:   98 F (36.7 C) (!) 97.5 F (36.4 C)  TempSrc:   Oral Oral  SpO2:   96% 98%  Weight:    65.2 kg (143 lb 12.8 oz)  Height:        Gen:  NAD    The results of significant diagnostics from this hospitalization (including imaging, microbiology, ancillary and laboratory) are listed below for reference.     Procedures and Diagnostic Studies:   Dg Chest 2 View  Result Date: 05/23/2017 CLINICAL DATA:  High blood pressure for couple of days. Chest tightness, shortness of breath, and palpitations since 1530 hours today. EXAM: CHEST  2 VIEW COMPARISON:  03/13/2016 FINDINGS: Emphysematous changes and scattered fibrosis in the lungs. Heart size and pulmonary vascularity are normal. No airspace disease or consolidation in the lungs. No blunting of costophrenic angles. No pneumothorax. Mediastinal contours appear intact. Calcification of the aorta. IMPRESSION: Emphysematous changes in the lungs. No evidence of active pulmonary disease. Aortic atherosclerosis. Electronically Signed   By: Lucienne Capers M.D.   On: 05/23/2017 19:20     Labs:   Basic Metabolic Panel: Recent Labs  Lab 05/23/17 1936 05/24/17 0134 05/25/17 0753  NA 139 140 140  K 3.5 3.4* 4.2  CL 104 106 108  CO2 23 26 23   GLUCOSE 112* 100* 92  BUN 14 12 12   CREATININE 0.67 0.78 0.68  CALCIUM 9.6 9.3 9.1   GFR Estimated Creatinine Clearance: 48.1 mL/min (by C-G formula based on SCr  of 0.68 mg/dL). Liver Function Tests: Recent Labs  Lab 05/24/17 0134  AST 21  ALT 10*  ALKPHOS 66  BILITOT 0.8  PROT 6.6  ALBUMIN 3.8   No results for input(s): LIPASE, AMYLASE in the last 168 hours. No results for input(s): AMMONIA in the last 168 hours. Coagulation profile Recent Labs  Lab 05/25/17 0753  INR 1.10    CBC: Recent Labs  Lab 05/23/17 1936 05/24/17 0134 05/25/17 0753  WBC 4.4 4.4 3.4*  HGB 13.5 12.9 12.3  HCT 40.0 39.2 37.8  MCV 94.1 94.7 95.7  PLT 256 246 235   Cardiac  Enzymes: Recent Labs  Lab 05/24/17 0134 05/24/17 0718 05/24/17 1232  TROPONINI 0.16* 0.10* 0.07*   BNP: Invalid input(s): POCBNP CBG: No results for input(s): GLUCAP in the last 168 hours. D-Dimer No results for input(s): DDIMER in the last 72 hours. Hgb A1c No results for input(s): HGBA1C in the last 72 hours. Lipid Profile Recent Labs    05/25/17 0753  CHOL 167  HDL 48  LDLCALC 101*  TRIG 88  CHOLHDL 3.5   Thyroid function studies Recent Labs    05/24/17 0134  TSH 5.072*   Anemia work up No results for input(s): VITAMINB12, FOLATE, FERRITIN, TIBC, IRON, RETICCTPCT in the last 72 hours. Microbiology No results found for this or any previous visit (from the past 240 hour(s)).   Discharge Instructions:   Discharge Instructions    Diet - low sodium heart healthy   Complete by:  As directed    Increase activity slowly   Complete by:  As directed      Allergies as of 05/26/2017      Reactions   Zyrtec [cetirizine]    Palpitations      Medication List    TAKE these medications   aspirin EC 81 MG tablet Take 1 tablet (81 mg total) by mouth daily.   Cod Liver Oil Caps Take 1 capsule by mouth daily.   flecainide 50 MG tablet Commonly known as:  TAMBOCOR Take 1 tablet (50 mg total) 2 (two) times daily by mouth.   pantoprazole 40 MG tablet Commonly known as:  PROTONIX Take 1 tablet (40 mg total) by mouth daily. Start taking on:  05/27/2017   Vitamin D3 1000 units Caps Take 1,000 Units by mouth daily as needed (supplemental).      Follow-up Information    Evans Lance, MD Follow up in 1 week(s).   Specialty:  Cardiology Why:  or with Renee Contact information: [address] [place] [address] [phone]            Time coordinating discharge: 35 min  Signed:  Geradine Girt   [facility] 05/26/2017, 11:10 AM

## 2017-05-26 NOTE — Progress Notes (Addendum)
    Patient feeling well post cath today. Rested well last night. Right radial cath site with bruising, and soft tissue swelling, but soft. Good radial pulse. Given post cath instructions/restrictions.   With essentially negative cardiac catheterization, this would argue against ACS.  Cannot explain the mild troponin elevation and EKG changes. Could possibly be related to coronary spasm, but no evidence on cath.  Based on results of the cardiac cath, okay to continue flecainide and current medications.  She can follow-up with Dr. Lovena Le in the outpatient setting in 2-3 weeks.  SignedReino Bellis, NP-C 05/26/2017, 11:14 AM Pager: [phone]

## 2017-05-27 ENCOUNTER — Telehealth: Payer: Self-pay | Admitting: Internal Medicine

## 2017-05-27 NOTE — Telephone Encounter (Signed)
New message  Patient states she is feeling fine right now, but she did call EMS this morning. Please call  Patient c/o Palpitations:  High priority if patient c/o lightheadedness, shortness of breath, or chest pain  1) How long have you had palpitations/irregular HR/ Afib? Are you having the symptoms now? Today, yes  2) Are you currently experiencing lightheadedness, SOB or CP? NO  3) Do you have a history of afib (atrial fibrillation) or irregular heart rhythm? yes  4) Have you checked your BP or HR? (document readings if available):185/105  HR 87  5) Are you experiencing any other symptoms? None

## 2017-05-27 NOTE — Telephone Encounter (Signed)
Patient calling to inform us she called EMS this morning d/t not feeling good, hypertension and palpitations.  (I reviewed EMS records but could not see EKGs nor summary report.  BPs were elevated) States she was just sitting there and "my BP seemed to go sky high". She was told to call her doctor to inform them of issues. Reports feeling fine currently.  No issues She is currently resting at home for the rest of the day/evening.   States that she started feeling better around lunch but when she started doing things around the house she felt bad again. BP was elevated but went down quickly. She reports following BPs: 12:00 -  186/92 12:05 -  156/89 12:10 -  146/83 12:20 -  135/84 12:40 -  126/85 Advised patient to continue to monitor.  Advised not to take her BP so frequently and if elevated to wait at least 30 minutes before taking again. Advised to call office if she remains elevated above the 140s. Also advised to call if AFib/palpitations become an issues.  She asked about taking an extra Flecainide if needed -- advised her to call the office and speak to someone first.  Explained that we may tell her ok to take an extra but to call and discuss with medical staff first. Patient verbalized understanding and agreeable to above stated plan.

## 2017-06-01 ENCOUNTER — Telehealth: Payer: Self-pay | Admitting: Internal Medicine

## 2017-06-01 NOTE — Telephone Encounter (Signed)
Patient c/o Palpitations:  High priority if patient c/o lightheadedness, shortness of breath, or chest pain  How long have you had palpitations/irregular HR/ Afib? Are you having the symptoms now? Since hospital 05/25/2017 1) Are you currently experiencing lightheadedness, SOB or CP?  no  2) Do you have a history of afib (atrial fibrillation) or irregular heart rhythm?  yes  3) Have you checked your BP or HR? (document readings if available): BP 179/104  Hr 83   BP 168/98   HR  81  Are you experiencing any other symptoms?  no

## 2017-06-01 NOTE — Telephone Encounter (Signed)
[name] Jamaica, PA-C  Theodoro Parma, RN  Caller: Unspecified (Today, 8:47 AM)        She may benefit from a revisit with [facility]. At her last visit with me recommended she start metoprolol though she did not and her BP was improved at the pharmacist visit, though a bit high again when she saw Dr. Lovena Le. This could help both BP and palpitations. She may also want to f/u with her PMD with mentions in notes of some anxiety as well. This could be contributing.   Renee

## 2017-06-01 NOTE — Telephone Encounter (Signed)
Patient reports she was admitted and cathed last week and was told she had a small heart attack. She states while she was there, she complained of "stomach problems" and was started on Protonix.  She calls today to report she had "real bad palpitations" last night and some today. She also still has stomach problems.  Her episode last night lasted several hours. She had no dizziness, SOB or CP.  During that time, she checked her BP. BP=179/104 and HR=83.  Confirmed with her she is taking medications as directed. She continues her Flecainide and Protonix.   Today, her BP is 178/101 and HR = 85. She is asymptomatic (other than "stomach issues she would not describe - she says she told the doctor at the hospital). She is not having palpitations at this time.  She is adamant that she does not have high blood pressure, that those readings are "flukes."  She is scheduled to see R. [place], [place] 1/15. She requests recommendations from [facility] or Dr. Lovena Le regarding palpitations prior to her appointment.

## 2017-06-02 NOTE — Telephone Encounter (Signed)
Agree with metoprolol. GT

## 2017-06-02 NOTE — Telephone Encounter (Signed)
Left message for Pt. Notified Pt may: A.  Start metoprolol as previously recommended by R. Charlcie Cradle. B.  Could schedule another appt with blood pressure clinic. C.  Could ask Dr. Lovena Le if he had any further recommendations.  Left this nurse name and # for call back to notify what Pt would like to do.

## 2017-06-05 NOTE — Progress Notes (Addendum)
Cardiology Office Note Date:  06/05/2017  Patient ID:  [name] [name], DOB 09-Nov-1929, MRN [id number] PCP:  Evans Lance, MD Electrophysiologist: Dr. Lovena Le                                                                                                      Chief Complaint: post hospital f/u  History of Present Illness: [name] [name] is a 84 y.o. female with history of palpitations (PSVT is mentioned in her notes, though Dr. Tanna Furry note state no sustained arrhythmias and APC's treated with Flecainide), HTN.  Historically had been maintained on flecainide for years for symptomatic PACs/PVCs though during a hospital stay CP stopped and started on metoprolol that she was very intolerant of,  and Rx Rythmol but never started. At an office visit with myself in November 2017, noted with PVCs that she was feeling/very bothersome to her, and given negative stress test resumed on her flecainide.    She was seen in f/u by Dr. Lovena Le in Dec doing better with minimal palpitations, discussed adding metoprolol if more palpitations.  She was seen by myself in June 2018, felt like her BP has been high, getting headaches lately.  She had minimal palpitatins that were not bothersome to her, no CP, no SOB.  She denied dizziness, near syncope or syncope.  She was seeing ENT for tinittus and hearing loss just fitted for hearing aids.  Low dose metoprolol was added for BP.  Though lower dose was never started  She saw Dr. Lovena Le in Sept for f/u, she was concerned/convinced she had drank a bottle of contaminated water and was having GI issues, her BP was elevated though thought a component of anxiety, doesn't look like she started the metoprolol, advised to minimize sodium, no symptoms of palpitations.  She was admitted to [facility] with c/o palpitations, noted to have elevated Trop, cath was done without CAD without clear reason for her trop, discharged 05/26/17 to f/u with EP for symptomatic PVCs  She comes today feeling so  much better.  She has kept and eye on her BP and has remained much improved.  She states she thinks the majority of her symptoms are 2/2 personal stress.  Her husband last year was diagnosed with early dementia, and though slow, has had some progression and she worries tremendously about this.  She has good family support though this causes her a lot of worry.  She also states that she was recommended to have GI evaluation being told some point historically that she had a hiatal hernia and mentions that the protonix seems to have given her good relief.   She has not had palpitations, no dizziness, near syncope or syncope, no CP or SOB.     Past Medical History:  Diagnosis Date  . Anemia    iron def  . Anxiety   . Arrhythmia    paroxysmal SVT and symtomatic PAC's  . Diverticulosis    colon  . History of colonic polyps    cecal  . Hypertension   . Osteoporosis 07/21/2011  t score -2.7  . Palpitations 05/24/2017  . Stricture esophagus   . Vitamin D deficiency    2010    Past Surgical History:  Procedure Laterality Date  . A&P, enterocele repair     s/p 2003  . ABDOMINAL HYSTERECTOMY    . BREAST REDUCTION SURGERY     s/p 1996      PATIENT DENIES BREAST REDUCTION   . HEMORRHOID SURGERY    . LEFT HEART CATH AND CORONARY ANGIOGRAPHY N/A 05/25/2017   Procedure: LEFT HEART CATH AND CORONARY ANGIOGRAPHY;  Surgeon: Jettie Booze, MD;  Location: [facility];  Service: Cardiovascular;  Laterality: N/A;    Current Outpatient Medications  Medication Sig Dispense Refill  . aspirin EC 81 MG tablet Take 1 tablet (81 mg total) by mouth daily. 30 tablet 0  . Cholecalciferol (VITAMIN D3) 1000 UNITS CAPS Take 1,000 Units by mouth daily as needed (supplemental).     Marland Kitchen Cod Liver Oil CAPS Take 1 capsule by mouth daily.    . flecainide (TAMBOCOR) 50 MG tablet Take 1 tablet (50 mg total) 2 (two) times daily by mouth. 180 tablet 2  . pantoprazole (PROTONIX) 40 MG tablet Take 1 tablet (40  mg total) by mouth daily. 30 tablet 0   No current facility-administered medications for this visit.     Allergies:   Zyrtec [cetirizine]   Social History:  The patient  reports that she quit smoking about 49 years ago. she has never used smokeless tobacco. She reports that she does not drink alcohol or use drugs.   Family History:  The patient's family history includes Breast cancer (age of onset: 38) in her other; Breast cancer (age of onset: 82) in her daughter; Cancer (age of onset: 61) in her other; Cancer (age of onset: 31) in her daughter; Cancer (age of onset: 54) in her sister; Hypertension in her father and sister; Kidney disease in her brother; Stroke in her father and mother.  ROS:  Please see the history of present illness.  All other systems are reviewed and otherwise negative.   PHYSICAL EXAM:  VS:  LMP 05/24/1970  BMI: There is no height or weight on file to calculate BMI. Well nourished, well developed, in no acute distress  HEENT: normocephalic, atraumatic  Neck: no JVD, carotid bruits or masses Cardiac: RRR; 1/6 SM, no extrasystoles, no rubs, or gallops Lungs:  CTA b/l, no wheezing, rhonchi or rales  Abd: soft, nontender MS: no deformity or atrophy Ext:  no edema  Skin: warm and dry Neuro:  No gross deficits appreciated Psych: euthymic mood, full affect  EKG:  05/24/17 SR 69bpm, measured PR 250ms, QRS 72ms, QTc 457ms 10/28/16: SR, 76bpm, borderline 1st degree AVBlock, PR 257ms, QRS 12ms, measured QT/QTc 342ms/405ms, not significantly changed from prior  05/25/17: LHC  Mid LAD lesion is 25% stenosed.  The left ventricular systolic function is normal.  LV end diastolic pressure is low.  The left ventricular ejection fraction is 55-65% by visual estimate.  There is no aortic valve stenosis. Nonobstructive coronary artery disease in the LAD.    05/24/17: TTE  Study Conclusions - Left ventricle: The cavity size was normal. Systolic function was   vigorous. The  estimated ejection fraction was in the range of 65%   to 70%. Wall motion was normal; there were no regional wall   motion abnormalities. Doppler parameters are consistent with   abnormal left ventricular relaxation (grade 1 diastolic   dysfunction). There was no  evidence of elevated ventricular   filling pressure by Doppler parameters. - Aortic valve: There was no regurgitation. - Aortic root: The aortic root was normal in size. - Mitral valve: There was mild regurgitation. - Right ventricle: The cavity size was normal. Wall thickness was   normal. Systolic function was normal. - Right atrium: The atrium was normal in size. - Tricuspid valve: There was trivial regurgitation. - Pulmonary arteries: Systolic pressure was within the normal   range. - Inferior vena cava: The vessel was normal in size. - Pericardium, extracardiac: There was no pericardial effusion.    Recent Labs: 05/24/2017: ALT 10; TSH 5.072 05/25/2017: BUN 12; Creatinine, Ser 0.68; Hemoglobin 12.3; Platelets 235; Potassium 4.2; Sodium 140  05/25/2017: Cholesterol 167; HDL 48; LDL Cholesterol 101; Total CHOL/HDL Ratio 3.5; Triglycerides 88; VLDL 18   Estimated Creatinine Clearance: 48.1 mL/min (by C-G formula based on SCr of 0.68 mg/dL).   Wt Readings from Last 3 Encounters:  05/26/17 143 lb 12.8 oz (65.2 kg)  01/31/17 148 lb (67.1 kg)  10/28/16 150 lb (68 kg)     Other studies reviewed: Additional studies/records reviewed today include: summarized above  ASSESSMENT AND PLAN:  1. Palpitations, PVCs, hx of PACs     Not an ongoing complaint since discharge, resolved     EKG/intervals look OK   2. HTN     No changes today     Disposition: Will refer her to GI, she is encouraged to discuss her anxiety with her PMD with this she tells me she hasn't seen in quite a long time, is difficult for her to drive so far where they are located, she is provided [facility] information for PMD and encouraged to get  established, see her in 6 months otherwise, sooner if needed.       Haywood Lasso, PA-C 06/05/2017 4:39 PM     [facility] [address] [place] [address] [phone] (office)  [phone] (fax)

## 2017-06-07 ENCOUNTER — Encounter: Payer: Self-pay | Admitting: Physician Assistant

## 2017-06-07 ENCOUNTER — Ambulatory Visit (INDEPENDENT_AMBULATORY_CARE_PROVIDER_SITE_OTHER): Payer: Medicare Other | Admitting: Physician Assistant

## 2017-06-07 VITALS — BP 146/82 | HR 80 | Ht 66.0 in | Wt 146.0 lb

## 2017-06-07 DIAGNOSIS — I491 Atrial premature depolarization: Secondary | ICD-10-CM

## 2017-06-07 DIAGNOSIS — I10 Essential (primary) hypertension: Secondary | ICD-10-CM

## 2017-06-07 DIAGNOSIS — I493 Ventricular premature depolarization: Secondary | ICD-10-CM

## 2017-06-07 DIAGNOSIS — K449 Diaphragmatic hernia without obstruction or gangrene: Secondary | ICD-10-CM | POA: Diagnosis not present

## 2017-06-07 NOTE — Patient Instructions (Addendum)
Medication Instructions:   Your physician recommends that you continue on your current medications as directed. Please refer to the Current Medication list given to you today.   If you need a refill on your cardiac medications before your next appointment, please call your pharmacy.  Labwork: NONE ORDERED  TODAY    Testing/Procedures:  NONE ORDERED  TODAY    Follow-Up:   You have been referred to  [facility]   Your physician wants you to follow-up in:  [place] will receive a reminder letter in the mail two months in advance. If you don't receive a letter, please call our office to schedule the follow-up appointment.       Any Other Special Instructions Will Be Listed Below (If Applicable).

## 2017-06-07 NOTE — Telephone Encounter (Signed)
Pt to see Tommye Standard today 06-07-17.  No further action at this time.

## 2017-06-15 ENCOUNTER — Encounter: Payer: Self-pay | Admitting: Physician Assistant

## 2017-06-15 ENCOUNTER — Ambulatory Visit (INDEPENDENT_AMBULATORY_CARE_PROVIDER_SITE_OTHER): Payer: Medicare Other | Admitting: Physician Assistant

## 2017-06-15 VITALS — BP 140/66 | HR 78 | Ht 63.0 in | Wt 144.8 lb

## 2017-06-15 DIAGNOSIS — K219 Gastro-esophageal reflux disease without esophagitis: Secondary | ICD-10-CM | POA: Diagnosis not present

## 2017-06-15 DIAGNOSIS — R109 Unspecified abdominal pain: Secondary | ICD-10-CM

## 2017-06-15 DIAGNOSIS — R1013 Epigastric pain: Secondary | ICD-10-CM | POA: Diagnosis not present

## 2017-06-15 MED ORDER — PANTOPRAZOLE SODIUM 40 MG PO TBEC: DELAYED_RELEASE_TABLET | ORAL | 3 refills | Status: DC

## 2017-06-15 MED ORDER — AMBULATORY NON FORMULARY MEDICATION: 0 refills | Status: DC

## 2017-06-15 NOTE — Progress Notes (Signed)
Subjective:    Patient ID: [name] [name], female    DOB: 11-17-1929, 84 y.o.   MRN: [id number]  HPI [name] is a pleasant 84 year old white female, new to GI today referred by [facility]/Dr. Lovena Le for evaluation of epigastric pain. Patient relates that she has had prior colonoscopies and endoscopy with Dr. Earlean Shawl but had not seen him in many years. She says she did have to have an esophageal dilation at one point. Patient has history of hypertension, TIA, osteoporosis, status post hysterectomy and hemorrhoidectomy. She had recent hospitalization with palpitations/PSVT. She apparently has long history of palpitations and has been on floor tonight for years. With recent admission she did have mildly elevated troponin. Cardiac cath was done without any significant coronary artery disease. She is to undergo EP for symptomatic PVCs. She followed up with cardiology last week, and per their notes had admitted that she felt that most of her symptoms were secondary to stress and anxiety. 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 Which she describes to me today is a discomfort high in her epigastrium that comes on intermittently and feels "like a heart beat" or a spasm. She says it can be very uncomfortable at times, and she does feel that stress at times will bring this on. She has not had any palpitations since she was discharged from the hospital but has continued to have intermittent epigastric discomfort which she describes as the pulsation or heartbeat. She says she's not been eating well because she is afraid to aggravate her symptoms. She was started on Protonix 40 mg by mouth every morning  in the hospital, and believes this is helping to settle her stomach somewhat but has caused some constipation. She's been managing this with prune juice. She denies any ongoing heartburn or indigestion no dysphagia or odynophagia. She says she would like someone to put her on medication for her  nerves.  She's not had any recent abdominal imaging.  Review of Systems Pertinent positive and negative review of systems were noted in the above HPI section.  All other review of systems was otherwise negative.  Outpatient Encounter Medications as of 06/15/2017  Medication Sig  . aspirin EC 81 MG tablet Take 1 tablet (81 mg total) by mouth daily.  . Cholecalciferol (VITAMIN D3) 1000 UNITS CAPS Take 1,000 Units by mouth daily as needed (supplemental).   Marland Kitchen Cod Liver Oil CAPS Take 1 capsule by mouth daily.  . flecainide (TAMBOCOR) 50 MG tablet Take 1 tablet (50 mg total) 2 (two) times daily by mouth.  . pantoprazole (PROTONIX) 40 MG tablet 1 tab by mouth every morning.  . [DISCONTINUED] pantoprazole (PROTONIX) 40 MG tablet Take 1 tablet (40 mg total) by mouth daily.  . AMBULATORY NON FORMULARY MEDICATION Medication Name: Gi Cocktail 90 ml- viscous lidocaine 2 % 90 ml- Dicyclomine 10 mg/5 ml 270 ML Maalox  Take 30 cc's every 4-6 hours   No facility-administered encounter medications on file as of 06/15/2017.    Allergies  Allergen Reactions  . Zyrtec [Cetirizine]     Palpitations   Patient Active Problem List   Diagnosis Date Noted  . Chest tightness   . Elevated troponin   . Chest pain 05/23/2017  . Palpitations 05/23/2017  . Rotator cuff arthropathy, right 04/08/2016  . Abdominal discomfort 04/01/2016  . Shoulder pain 03/25/2016  . Arthritis of knee 03/25/2016  . Chest pain on breathing 03/14/2016  . Presbycusis of both ears 01/15/2016  .  Tinnitus of right ear 01/15/2016  . Lipoma 02/04/2015  . Skin cancer of arm 09/26/2014  . Chest discomfort 01/20/2014  . Palpitation 09/28/2012  . Allergic rhinitis 12/25/2011  . TIA (transient ischemic attack) 07/19/2011  . ATRIAL PREMATURE BEATS 03/20/2009  . ANEMIA-IRON DEFICIENCY 04/02/2008  . Anxiety state 04/02/2008  . Essential hypertension 04/02/2008  . GERD 04/02/2008  . OSTEOPOROSIS 04/02/2008   Social History    Socioeconomic History  . Marital status: Married    Spouse name: Not on file  . Number of children: Not on file  . Years of education: Not on file  . Highest education level: Not on file  Social Needs  . Financial resource strain: Not on file  . Food insecurity - worry: Not on file  . Food insecurity - inability: Not on file  . Transportation needs - medical: Not on file  . Transportation needs - non-medical: Not on file  Occupational History  . Occupation: Retired  Tobacco Use  . Smoking status: Former Smoker    Last attempt to quit: 05/24/1968    Years since quitting: 49.0  . Smokeless tobacco: Never Used  . Tobacco comment: only smoked 7 years  Substance and Sexual Activity  . Alcohol use: No    Alcohol/week: 0.0 oz  . Drug use: No  . Sexual activity: No    Comment: 1st intercourse 11 yo-1 partner  Other Topics Concern  . Not on file  Social History Narrative  . Not on file    Ms. Shimon's family history includes Breast cancer (age of onset: 42) in her other; Breast cancer (age of onset: 24) in her daughter; Cancer (age of onset: 31) in her other; Cancer (age of onset: 97) in her daughter; Cancer (age of onset: 67) in her sister; Hypertension in her father and sister; Kidney disease in her brother; Stroke in her father and mother.      Objective:    Vitals:   06/15/17 1028  BP: 140/66  Pulse: 78    Physical Exam well-developed elderly white female in no acute distress, she is anxious and talkative blood pressure 140/66 pulse 78, height 5 foot 3, weight 144, BMI 25.6. HEENT ;nontraumatic normocephalic EOMI PERRLA sclera anicteric, Cardiovascular; regular rate and rhythm with S1-S2 no murmur or gallop, Pulmonary ;clear bilaterally, Abdomen; were some minimal tenderness in the epigastrium no guarding or rebound no palpable mass or hepatosplenomegaly bowel sounds present no bruit, Rectal ;exam not done, Extremities; no clubbing cyanosis or edema skin warm and dry,  Neuropsych; mood and affect appropriate, patient anxious       Assessment & Plan:   #82 84 year old white female with new onset intermittent epigastric discomfort described as a pulsating or heartbeat-type sensation occurring over the past several weeks. This been associated with a decrease in appetite. Cardiac catheterization done earlier this month negative. Patient with long history of PSVT which is been managed with flecainide  Is unclear to me whether her current symptoms are of GI etiology or cardiovascular(Wonder if she is sensing palpitations as a pulsation  in her epigastrium )  #2 history of GERD with remote esophageal dilation #3 hypertension #4 history of TIA #5 history of rectocele, status post hysterectomy and hemorrhoidectomy #6 anxiety-situational-which is likely contributing to her symptoms.  Plan; continue Protonix 40 mg by mouth every morning She will continue prune juice on a daily basis to combat any mild constipation Will give her a trial of GI cocktail 30 mL every 4-6 hours when necessary  Schedule for CT of the abdomen and pelvis with IV but no oral contrast as she says she cannot tolerate oral contrast. Further plans pending results of CT and response to GI cocktail. Patient will be established with Dr. Ardis Hughs. Consider EGD.  Sonni Barse Genia Harold PA-C 06/15/2017   Cc: Evans Lance, MD

## 2017-06-15 NOTE — Patient Instructions (Signed)
We sent refills to [facility] for the Protonix 40 mg.  We called in to [facility] for the GI Cocktail. They will mix it when you come to the counter at the pharmacy.  It will take 30 minutes for them to get it ready.   You have been scheduled for a CT scan of the abdomen and pelvis at [facility] ([address]---this is in the same building as [facility]).   You are scheduled on Friday 06-17-2017.  Arrive at 9:00 am to drink the water based contrast.  Please follow the written instructions below on the day of your exam:  WARNING: IF YOU ARE ALLERGIC TO IODINE/X-RAY DYE, PLEASE NOTIFY RADIOLOGY IMMEDIATELY AT [phone]! YOU WILL BE GIVEN A 13 HOUR PREMEDICATION PREP.  1) Do not eat  anything after 7:00 am (4 hours prior to your test)  You may take any medications as prescribed with a small amount of water except for the following: Metformin, Glucophage, Glucovance, Avandamet, Riomet, Fortamet, Actoplus Met, Janumet, Glumetza or Metaglip. The above medications must be held the day of the exam AND 48 hours after the exam.  The purpose of you drinking the oral contrast is to aid in the visualization of your intestinal tract. The contrast solution may cause some diarrhea. Before your exam is started, you will be given a small amount of fluid to drink. Depending on your individual set of symptoms, you may also receive an intravenous injection of x-ray contrast/dye. Plan on being at [facility] for 30 minutes or long, depending on the type of exam you are having performed.  If you have any questions regarding your exam or if you need to reschedule, you may call the CT department at [phone] between the hours of 8:00 am and 5:00 pm, Monday-Friday.  ________________________________________________________________________

## 2017-06-16 NOTE — Progress Notes (Signed)
I agree with the above note plan.  Not sure this is related to her GI tract.

## 2017-06-17 ENCOUNTER — Ambulatory Visit (INDEPENDENT_AMBULATORY_CARE_PROVIDER_SITE_OTHER)
Admission: RE | Admit: 2017-06-17 | Discharge: 2017-06-17 | Disposition: A | Discharge status: Home / Self Care | Payer: Medicare Other | Source: Ambulatory Visit | Attending: Physician Assistant | Admitting: Physician Assistant

## 2017-06-17 DIAGNOSIS — R1013 Epigastric pain: Secondary | ICD-10-CM

## 2017-06-17 DIAGNOSIS — R109 Unspecified abdominal pain: Secondary | ICD-10-CM

## 2017-06-17 MED ORDER — IOPAMIDOL (ISOVUE-300) INJECTION 61%
100.0000 mL | Freq: Once | INTRAVENOUS | Status: AC | PRN
  Administered 2017-06-17: 100 mL via INTRAVENOUS

## 2017-06-21 ENCOUNTER — Telehealth: Payer: Self-pay | Admitting: Physician Assistant

## 2017-06-21 NOTE — Telephone Encounter (Signed)
Advised of normal report.

## 2017-07-08 ENCOUNTER — Telehealth: Payer: Self-pay | Admitting: Physician Assistant

## 2017-07-08 NOTE — Telephone Encounter (Signed)
Spoke with patient. She has c/o right lung tightness and esophageal irritation. She denied having s/s of chest pain, arm or neck pain, etc. I referred her back to her primary care or urgent care center. She stated she no longer see's her PCP, but will go to urgent care tonight to see if they will do a CXR.

## 2017-07-08 NOTE — Telephone Encounter (Signed)
New message    Pt c/o Shortness Of Breath: STAT if SOB developed within the last 24 hours or pt is noticeably SOB on the phone  1. Are you currently SOB (can you hear that pt is SOB on the phone)?  yes  2. How long have you been experiencing SOB? 1 week  3. Are you SOB when sitting or when up moving around? both  4. Are you currently experiencing any other symptoms? Tightening in lungs and stomach and she says she had a craterization and wants to make sure she's not getting an infection

## 2017-07-19 ENCOUNTER — Telehealth: Payer: Self-pay | Admitting: Internal Medicine

## 2017-07-19 NOTE — Telephone Encounter (Signed)
New message  Patient states she feels like heart is skipping a beat.   Patient c/o Palpitations:  High priority if patient c/o lightheadedness, shortness of breath, or chest pain  1) How long have you had palpitations/irregular HR/ Afib? Are you having the symptoms now? 3 days  2) Are you currently experiencing lightheadedness, SOB or CP? NO  3) Do you have a history of afib (atrial fibrillation) or irregular heart rhythm? YES  4) Have you checked your BP or HR? (document readings if available):Patient states her BP is good  5) Are you experiencing any other symptoms? Unable to eat

## 2017-07-19 NOTE — Telephone Encounter (Signed)
Spoke with patient in regards to her palpitations recently and stomach upset, no CP.[name] [name]  Her BP has been 139/78 recently and she is not sure of HR.[name] [name] She has an appt on 3/1 with Dr Lovena Le..  Patient verbalized understanding that if symptoms become worse to call us or 911.[name] [name]

## 2017-07-22 ENCOUNTER — Ambulatory Visit (INDEPENDENT_AMBULATORY_CARE_PROVIDER_SITE_OTHER): Payer: Medicare Other | Admitting: Internal Medicine

## 2017-07-22 ENCOUNTER — Encounter: Payer: Self-pay | Admitting: Internal Medicine

## 2017-07-22 VITALS — BP 122/60 | HR 79 | Ht 63.0 in | Wt 143.0 lb

## 2017-07-22 DIAGNOSIS — I10 Essential (primary) hypertension: Secondary | ICD-10-CM | POA: Diagnosis not present

## 2017-07-22 DIAGNOSIS — I491 Atrial premature depolarization: Secondary | ICD-10-CM

## 2017-07-22 DIAGNOSIS — I493 Ventricular premature depolarization: Secondary | ICD-10-CM | POA: Diagnosis not present

## 2017-07-22 DIAGNOSIS — R002 Palpitations: Secondary | ICD-10-CM

## 2017-07-22 MED ORDER — FLECAINIDE ACETATE 50 MG PO TABS
ORAL_TABLET | ORAL | 3 refills | Status: DC
Start: 2017-07-22 — End: 2018-02-08

## 2017-07-22 NOTE — Progress Notes (Signed)
HPI [name] [name] returns today for ongoing evaluation and management of palpitations. She is anxious and has been under increased stress as her husband of over 3 years has developed worsening dementia. She has been on 50 mg twice daily after presenting with chest pain and a slightly elevated troponin. Her cath was negative for CAD. She has experienced palpitations in the afternoon as well as at night.  Allergies  Allergen Reactions  . Other Anaphylaxis    GI Cocktail  . Pantoprazole Other (See Comments)    Upset stomach and weight loss  . Zyrtec [Cetirizine]     Palpitations     Current Outpatient Medications  Medication Sig Dispense Refill  . aspirin EC 81 MG tablet Take 1 tablet (81 mg total) by mouth daily. 30 tablet 0  . Cholecalciferol (VITAMIN D3) 1000 UNITS CAPS Take 1,000 Units by mouth daily as needed (supplemental).     Marland Kitchen Cod Liver Oil CAPS Take 1 capsule by mouth daily.    . flecainide (TAMBOCOR) 50 MG tablet Take 1 tablet (50 mg total) 2 (two) times daily by mouth. 180 tablet 2   No current facility-administered medications for this visit.      Past Medical History:  Diagnosis Date  . Anemia    iron def  . Anxiety   . Arrhythmia    paroxysmal SVT and symtomatic PAC's  . Diverticulosis    colon  . History of colonic polyps    cecal  . Hypertension   . Osteoporosis 07/21/2011   t score -2.7  . Palpitations 05/24/2017  . Stricture esophagus   . Vitamin D deficiency    2010    ROS:   All systems reviewed and negative except as noted in the HPI.   Past Surgical History:  Procedure Laterality Date  . A&P, enterocele repair     s/p 2003  . ABDOMINAL HYSTERECTOMY    . BREAST REDUCTION SURGERY     s/p 1996      PATIENT DENIES BREAST REDUCTION   . HEMORRHOID SURGERY    . LEFT HEART CATH AND CORONARY ANGIOGRAPHY N/A 05/25/2017   Procedure: LEFT HEART CATH AND CORONARY ANGIOGRAPHY;  Surgeon: Jettie Booze, MD;  Location: [facility];   Service: Cardiovascular;  Laterality: N/A;     Family History  Problem Relation Age of Onset  . Stroke Father        @ 49  . Hypertension Father   . Hypertension Sister   . Cancer Sister 70       brain  . Breast cancer Other 42       breast  . Breast cancer Daughter 16  . Cancer Daughter 57       breast  . Cancer Other 19       colon/MELANOMA  . Stroke Mother        medication induced  . Kidney disease Brother      Social History   Socioeconomic History  . Marital status: Married    Spouse name: Not on file  . Number of children: Not on file  . Years of education: Not on file  . Highest education level: Not on file  Social Needs  . Financial resource strain: Not on file  . Food insecurity - worry: Not on file  . Food insecurity - inability: Not on file  . Transportation needs - medical: Not on file  . Transportation needs - non-medical: Not on file  Occupational  History  . Occupation: Retired  Tobacco Use  . Smoking status: Former Smoker    Last attempt to quit: 05/24/1968    Years since quitting: 49.1  . Smokeless tobacco: Never Used  . Tobacco comment: only smoked 7 years  Substance and Sexual Activity  . Alcohol use: No    Alcohol/week: 0.0 oz  . Drug use: No  . Sexual activity: No    Comment: 1st intercourse 22 yo-1 partner  Other Topics Concern  . Not on file  Social History Narrative  . Not on file     BP 122/60   Pulse 79   Ht 5\' 3"  (1.6 m)   Wt 143 lb (64.9 kg)   LMP 05/24/1970   BMI 25.33 kg/m   Physical Exam:  Well appearing elderly woman, NAD HEENT: Unremarkable Neck:  N6 cm JVD, no thyromegally Lymphatics:  No adenopathy Back:  No CVA tenderness Lungs:  Clear with no wheezes HEART:  Regular rate rhythm, no murmurs, no rubs, no clicks Abd:  soft, positive bowel sounds, no organomegally, no rebound, no guarding Ext:  2 plus pulses, no edema, no cyanosis, no clubbing Skin:  No rashes no nodules Neuro:  CN II through XII intact,  motor grossly intact  EKG - NSR   Assess/Plan: 1. Palpitations - she has both PAC's and PVC's. I have recommended she increase her flecainde to her old dose of 100 mg in the a.m. And 50 mg in the p.m. If she has break through symptoms then she can take an additional 50 mg. 2. Chest pain - her pain is better and non-cardiac. She could be having reflux. 3. HTN - her blood pressure is normal.   Mikle Bosworth.D.

## 2017-07-22 NOTE — Patient Instructions (Addendum)
Medication Instructions:  Your physician has recommended you make the following change in your medication:  1.  Increase your flecainide 50 mg tablets.  Take two tablets (for 100 mg) in the morning and one tablet (50 mg) at night.   You may take one additional flecainide as needed for palpitations. DO NOT TAKE more than FOUR TABLETS DAILY.  Labwork: None ordered.  Testing/Procedures: None ordered.  Follow-Up: Your physician wants you to follow-up in: 4 months with Dr. Lovena Le.    Any Other Special Instructions Will Be Listed Below (If Applicable).  If you need a refill on your cardiac medications before your next appointment, please call your pharmacy.

## 2017-11-29 ENCOUNTER — Encounter: Payer: Self-pay | Admitting: Physician Assistant

## 2017-12-12 ENCOUNTER — Ambulatory Visit: Payer: Medicare Other | Admitting: Physician Assistant

## 2018-02-08 ENCOUNTER — Ambulatory Visit (INDEPENDENT_AMBULATORY_CARE_PROVIDER_SITE_OTHER): Payer: Medicare Other | Admitting: Internal Medicine

## 2018-02-08 ENCOUNTER — Encounter: Payer: Self-pay | Admitting: Internal Medicine

## 2018-02-08 VITALS — BP 130/80 | HR 71 | Ht 66.0 in | Wt 150.2 lb

## 2018-02-08 DIAGNOSIS — I491 Atrial premature depolarization: Secondary | ICD-10-CM

## 2018-02-08 DIAGNOSIS — I10 Essential (primary) hypertension: Secondary | ICD-10-CM | POA: Diagnosis not present

## 2018-02-08 DIAGNOSIS — I493 Ventricular premature depolarization: Secondary | ICD-10-CM | POA: Diagnosis not present

## 2018-02-08 DIAGNOSIS — R002 Palpitations: Secondary | ICD-10-CM

## 2018-02-08 MED ORDER — PROPAFENONE HCL 225 MG PO TABS: 225.0000 mg | ORAL_TABLET | Freq: Two times a day (BID) | ORAL | 11 refills | Status: DC

## 2018-02-08 NOTE — Progress Notes (Signed)
HPI Mrs. Milius returns today for ongoing evaluation and management of palpitations. She is anxious and has been under increased stress as her husband of over 78 years has developed worsening dementia. She has been on 50 mg twice daily after presenting with chest pain and a slightly elevated troponin. Her cath was negative for CAD. She has experienced palpitations in the afternoon as well as at night and when I saw her last, I asked her to increase the dose of her flecainide. In the interim, she has had a funny sound in her head. No palpitations.   Allergies  Allergen Reactions  . Other Anaphylaxis    GI Cocktail  . Pantoprazole Other (See Comments)    Upset stomach and weight loss  . Zyrtec [Cetirizine]     Palpitations     Current Outpatient Medications  Medication Sig Dispense Refill  . Cholecalciferol (VITAMIN D3) 1000 UNITS CAPS Take 1,000 Units by mouth daily as needed (supplemental).     Marland Kitchen Cod Liver Oil CAPS Take 1 capsule by mouth daily.     No current facility-administered medications for this visit.      Past Medical History:  Diagnosis Date  . Anemia    iron def  . Anxiety   . Arrhythmia    paroxysmal SVT and symtomatic PAC's  . Diverticulosis    colon  . History of colonic polyps    cecal  . Hypertension   . Osteoporosis 07/21/2011   t score -2.7  . Palpitations 05/24/2017  . Stricture esophagus   . Vitamin D deficiency    2010    ROS:   All systems reviewed and negative except as noted in the HPI.   Past Surgical History:  Procedure Laterality Date  . A&P, enterocele repair     s/p 2003  . ABDOMINAL HYSTERECTOMY    . BREAST REDUCTION SURGERY     s/p 1996      PATIENT DENIES BREAST REDUCTION   . HEMORRHOID SURGERY    . LEFT HEART CATH AND CORONARY ANGIOGRAPHY N/A 05/25/2017   Procedure: LEFT HEART CATH AND CORONARY ANGIOGRAPHY;  Surgeon: Jettie Booze, MD;  Location: [facility];  Service: Cardiovascular;  Laterality: N/A;      Family History  Problem Relation Age of Onset  . Stroke Father        @ 61  . Hypertension Father   . Hypertension Sister   . Cancer Sister 71       brain  . Breast cancer Other 42       breast  . Breast cancer Daughter 85  . Cancer Daughter 29       breast  . Cancer Other 29       colon/MELANOMA  . Stroke Mother        medication induced  . Kidney disease Brother      Social History   Socioeconomic History  . Marital status: Married    Spouse name: Not on file  . Number of children: Not on file  . Years of education: Not on file  . Highest education level: Not on file  Occupational History  . Occupation: Retired  Scientific laboratory technician  . Financial resource strain: Not on file  . Food insecurity:    Worry: Not on file    Inability: Not on file  . Transportation needs:    Medical: Not on file    Non-medical: Not on file  Tobacco Use  .  Smoking status: Former Smoker    Last attempt to quit: 05/24/1968    Years since quitting: 49.7  . Smokeless tobacco: Never Used  . Tobacco comment: only smoked 7 years  Substance and Sexual Activity  . Alcohol use: No    Alcohol/week: 0.0 standard drinks  . Drug use: No  . Sexual activity: Never    Comment: 1st intercourse 70 yo-1 partner  Lifestyle  . Physical activity:    Days per week: Not on file    Minutes per session: Not on file  . Stress: Not on file  Relationships  . Social connections:    Talks on phone: Not on file    Gets together: Not on file    Attends religious service: Not on file    Active member of club or organization: Not on file    Attends meetings of clubs or organizations: Not on file    Relationship status: Not on file  . Intimate partner violence:    Fear of current or ex partner: Not on file    Emotionally abused: Not on file    Physically abused: Not on file    Forced sexual activity: Not on file  Other Topics Concern  . Not on file  Social History Narrative  . Not on file     BP 130/80    Pulse 71   Ht 5\' 6"  (1.676 m)   Wt 150 lb 3.2 oz (68.1 kg)   LMP 05/24/1970   SpO2 95%   BMI 24.24 kg/m   Physical Exam:  Well appearing NAD HEENT: Unremarkable Neck:  No JVD, no thyromegally Lymphatics:  No adenopathy Back:  No CVA tenderness Lungs:  Clear with no wheezes   CV - rate rhythm, no murmurs, no rubs, no clicks Abd:  soft, positive bowel sounds, no organomegally, no rebound, no guarding Ext:  2 plus pulses, no edema, no cyanosis, no clubbing Skin:  No rashes no nodules Neuro:  CN II through XII intact, motor grossly intact  EKG - nsr  Assess/Plan: 1. Palpitations - her symptoms are controlled but she has a funny sound in her head. I have recommended that she stop the flecainide for a weak and start rhythmol 225 bid. She will return for a ECG in a couple of weeks.  2. HTN - her blood pressure has been well controlled.  3. Chest pain - none. She will continue watchful waiting 4. Anxiety - she is quite anxious about stopping the flecainide. She has been taking for almost 20 years.   [name] [name].D.

## 2018-02-08 NOTE — Patient Instructions (Addendum)
Medication Instructions:  Your physician has recommended you make the following change in your medication:   1.  Stop taking flecainide today 02/08/2018  2.  On 02/16/2018 start taking propafenone (Rhythmol) 225 mg- Take one tablet by mouth twice a day   Labwork: None ordered.  Testing/Procedures: You will come to the [facility] office on February 23, 2018 for a nurse visit with an EKG.  February 23, 2018 at 11:00 am   Follow-Up: Your physician wants you to follow-up in: 3 months with Dr. Lovena Le.    May 09, 2018 at 12:15 pm.    Any Other Special Instructions Will Be Listed Below (If Applicable).  If you need a refill on your cardiac medications before your next appointment, please call your pharmacy.

## 2018-02-23 ENCOUNTER — Ambulatory Visit: Payer: Medicare Other

## 2018-03-21 DIAGNOSIS — H6122 Impacted cerumen, left ear: Secondary | ICD-10-CM | POA: Diagnosis not present

## 2018-03-21 DIAGNOSIS — H9113 Presbycusis, bilateral: Secondary | ICD-10-CM | POA: Diagnosis not present

## 2018-03-21 DIAGNOSIS — H9311 Tinnitus, right ear: Secondary | ICD-10-CM | POA: Diagnosis not present

## 2018-03-22 DIAGNOSIS — H903 Sensorineural hearing loss, bilateral: Secondary | ICD-10-CM | POA: Diagnosis not present

## 2018-04-24 ENCOUNTER — Other Ambulatory Visit: Payer: Self-pay

## 2018-04-24 ENCOUNTER — Encounter (HOSPITAL_COMMUNITY): Payer: Self-pay | Admitting: Emergency Medicine

## 2018-04-24 ENCOUNTER — Ambulatory Visit (HOSPITAL_COMMUNITY)
Admission: EM | Admit: 2018-04-24 | Discharge: 2018-04-24 | Disposition: A | Discharge status: Home / Self Care | Payer: Medicare Other | Attending: Internal Medicine | Admitting: Internal Medicine

## 2018-04-24 DIAGNOSIS — R300 Dysuria: Secondary | ICD-10-CM | POA: Insufficient documentation

## 2018-04-24 DIAGNOSIS — R7989 Other specified abnormal findings of blood chemistry: Secondary | ICD-10-CM | POA: Diagnosis not present

## 2018-04-24 DIAGNOSIS — E559 Vitamin D deficiency, unspecified: Secondary | ICD-10-CM | POA: Insufficient documentation

## 2018-04-24 DIAGNOSIS — G459 Transient cerebral ischemic attack, unspecified: Secondary | ICD-10-CM | POA: Insufficient documentation

## 2018-04-24 DIAGNOSIS — M79606 Pain in leg, unspecified: Secondary | ICD-10-CM | POA: Diagnosis present

## 2018-04-24 DIAGNOSIS — Z888 Allergy status to other drugs, medicaments and biological substances status: Secondary | ICD-10-CM | POA: Diagnosis not present

## 2018-04-24 DIAGNOSIS — Z8249 Family history of ischemic heart disease and other diseases of the circulatory system: Secondary | ICD-10-CM | POA: Insufficient documentation

## 2018-04-24 DIAGNOSIS — I10 Essential (primary) hypertension: Secondary | ICD-10-CM | POA: Insufficient documentation

## 2018-04-24 DIAGNOSIS — Z9889 Other specified postprocedural states: Secondary | ICD-10-CM | POA: Diagnosis not present

## 2018-04-24 DIAGNOSIS — Z87891 Personal history of nicotine dependence: Secondary | ICD-10-CM | POA: Insufficient documentation

## 2018-04-24 DIAGNOSIS — M25561 Pain in right knee: Secondary | ICD-10-CM

## 2018-04-24 DIAGNOSIS — M1711 Unilateral primary osteoarthritis, right knee: Secondary | ICD-10-CM | POA: Insufficient documentation

## 2018-04-24 LAB — POCT URINALYSIS DIP (DEVICE)
Bilirubin Urine: NEGATIVE
Glucose, UA: NEGATIVE mg/dL
Ketones, ur: NEGATIVE mg/dL
Nitrite: NEGATIVE
Protein, ur: NEGATIVE mg/dL
Specific Gravity, Urine: 1.015 (ref 1.005–1.030)
Urobilinogen, UA: 0.2 mg/dL (ref 0.0–1.0)
pH: 7 (ref 5.0–8.0)

## 2018-04-24 NOTE — Discharge Instructions (Addendum)
1- Continue taking the aspirin as needed for pain 2- Ice your knee for 15 minutes 2-4 times a day for the next 48 hours 3- I put in the referral for orthopedic, you may call to see them.  4- your urine has very small amounts of white cells and red cells. I sent to urine for a culture and we will await on results before we  treat.

## 2018-04-24 NOTE — ED Triage Notes (Signed)
Pt has a vein behind her right knee that has gotten very large and she is having pain behind her knee.  She states it was very swollen last night.

## 2018-04-24 NOTE — ED Provider Notes (Signed)
[place]    CSN: [id number] Arrival date & time: 04/24/18  1326     History   Chief Complaint Chief Complaint  Patient presents with  . Leg Pain    right    HPI [name] [name] is a 84 y.o. female.   1-Who present today with R knee pain. She states she recalls hurting it yesterday pm when she got out of the car when her son was taking her to the store. She took ASA 325 mg and her pain is much better now.  She forgot about it, til just now. She noticed her R knee swelling which she has OA, and could not move it last night. She called her vein MD for re-check and they have not called her yet.  2- Has been having intermittent dysuria x 2 weeks and would like a UA checked. Denies flank pain or fever.   She has taken all her medications today as usual.      Past Medical History:  Diagnosis Date  . Anemia    iron def  . Anxiety   . Arrhythmia    paroxysmal SVT and symtomatic PAC's  . Diverticulosis    colon  . History of colonic polyps    cecal  . Hypertension   . Osteoporosis 07/21/2011   t score -2.7  . Palpitations 05/24/2017  . Stricture esophagus   . Vitamin D deficiency    2010    Patient Active Problem List   Diagnosis Date Noted  . Chest tightness   . Elevated troponin   . Chest pain 05/23/2017  . Palpitations 05/23/2017  . Rotator cuff arthropathy, right 04/08/2016  . Abdominal discomfort 04/01/2016  . Shoulder pain 03/25/2016  . Arthritis of knee 03/25/2016  . Chest pain on breathing 03/14/2016  . Presbycusis of both ears 01/15/2016  . Tinnitus of right ear 01/15/2016  . Lipoma 02/04/2015  . Skin cancer of arm 09/26/2014  . Chest discomfort 01/20/2014  . Palpitation 09/28/2012  . Allergic rhinitis 12/25/2011  . TIA (transient ischemic attack) 07/19/2011  . ATRIAL PREMATURE BEATS 03/20/2009  . ANEMIA-IRON DEFICIENCY 04/02/2008  . Anxiety state 04/02/2008  . Essential hypertension 04/02/2008  . GERD 04/02/2008  . OSTEOPOROSIS  04/02/2008    Past Surgical History:  Procedure Laterality Date  . A&P, enterocele repair     s/p 2003  . ABDOMINAL HYSTERECTOMY    . BREAST REDUCTION SURGERY     s/p 1996      PATIENT DENIES BREAST REDUCTION   . HEMORRHOID SURGERY    . LEFT HEART CATH AND CORONARY ANGIOGRAPHY N/A 05/25/2017   Procedure: LEFT HEART CATH AND CORONARY ANGIOGRAPHY;  Surgeon: Jettie Booze, MD;  Location: [facility];  Service: Cardiovascular;  Laterality: N/A;    OB History    Gravida  2   Para  2   Term      Preterm      AB      Living  1     SAB      TAB      Ectopic      Multiple      Live Births               Home Medications    Prior to Admission medications   Medication Sig Start Date End Date Taking? Authorizing Provider  flecainide (TAMBOCOR) 100 MG tablet Take 100 mg by mouth 2 (two) times daily.   Yes [provider]  Cholecalciferol (VITAMIN D3) 1000 UNITS CAPS Take 1,000 Units by mouth daily as needed (supplemental).     [provider]  [facility] Liver Oil CAPS Take 1 capsule by mouth daily.    [provider]  propafenone (RYTHMOL) 225 MG tablet Take 1 tablet (225 mg total) by mouth 2 (two) times daily. 02/08/18   Evans Lance, MD    Family History Family History  Problem Relation Age of Onset  . Stroke Father        @ 73  . Hypertension Father   . Hypertension Sister   . Cancer Sister 90       brain  . Breast cancer Other 42       breast  . Breast cancer Daughter 24  . Cancer Daughter 26       breast  . Cancer Other 39       colon/MELANOMA  . Stroke Mother        medication induced  . Kidney disease Brother     Social History Social History   Tobacco Use  . Smoking status: Former Smoker    Last attempt to quit: 05/24/1968    Years since quitting: 49.9  . Smokeless tobacco: Never Used  . Tobacco comment: only smoked 7 years  Substance Use Topics  . Alcohol use: No    Alcohol/week: 0.0 standard drinks  .  Drug use: No     Allergies   Other; Pantoprazole; and Zyrtec [cetirizine]   Review of Systems Review of Systems  Constitutional: Negative for chills, diaphoresis and fever.  Eyes: Negative for visual disturbance.  Respiratory: Negative for chest tightness and shortness of breath.   Cardiovascular: Negative for chest pain, palpitations and leg swelling.  Gastrointestinal: Negative for abdominal pain, nausea and vomiting.  Genitourinary: Positive for dysuria, frequency and urgency. Negative for hematuria.  Musculoskeletal: Positive for joint swelling. Negative for back pain.  Skin: Negative for rash.  Neurological: Negative for dizziness and headaches.     Physical Exam Triage Vital Signs ED Triage Vitals  Enc Vitals Group     BP 04/24/18 1500 (!) 160/79     Pulse Rate 04/24/18 1500 75     Resp --      Temp 04/24/18 1500 97.6 F (36.4 C)     Temp Source 04/24/18 1500 Oral     SpO2 04/24/18 1500 97 %     Weight --      Height --      Head Circumference --      Peak Flow --      Pain Score 04/24/18 1459 8     Pain Loc --      Pain Edu? --      Excl. in [place]? --    No data found.  Updated Vital Signs BP (!) 160/79 (BP Location: Left Arm)   Pulse 75   Temp 97.6 F (36.4 C) (Oral)   LMP 05/24/1970   SpO2 97%   Visual Acuity Right Eye Distance:   Left Eye Distance:   Bilateral Distance:    Right Eye Near:   Left Eye Near:    Bilateral Near:     Physical Exam  Constitutional: She is oriented to person, place, and time. She appears well-developed and well-nourished. No distress.  HENT:  Head: Normocephalic.  Right Ear: External ear normal.  Left Ear: External ear normal.  Nose: Nose normal.  Eyes: Conjunctivae are normal. No scleral icterus.  Neck: Neck supple.  Cardiovascular: Normal  rate and regular rhythm.  No murmur heard. Pulmonary/Chest: Breath sounds normal.  Musculoskeletal: She exhibits no edema.  R KNEE- has tenderness with ROM on medial region.  Has mild effusion compared to the L. This joint is also larger than the L. Posterior knee soft tissue is tender. Has a lot of varicose veins and the one behind her knee has been there x 1 y.  LOWER LEGS- no edema , calf cords or tenderness.   Neurological: She is alert and oriented to person, place, and time.  Skin: Skin is warm and dry. No rash noted. She is not diaphoretic. No erythema.  Psychiatric: She has a normal mood and affect. Her behavior is normal. Judgment and thought content normal.  Nursing note and vitals reviewed.  UC Treatments / Results  Labs (all labs ordered are listed, but only abnormal results are displayed) Labs Reviewed - No data to display  EKG None  Radiology No results found.   Medications Ordered in UC Medications - No data to display  Initial Impression / Assessment and Plan / UC Course  I have reviewed the triage vital signs and the nursing notes. She requested for me to send her to ortho, since she does not have one, so I made the referral.  She may continue the ASA prn pain. Advised to ice the knee, see instructions.  Pt has been here for a long time, and is some pain. She  was gone for a while before she was roomed. She was let go before I was told her BP was higher than when she arrived, so I called her and left a voice mail asking her to do BP diaries and if BP > 140/90, needed to see her PCP right away or come here.  An ace bandage was placed on her R knee and told needs to FU with PCP. See instructions.     Final Clinical Impressions(s) / UC Diagnoses   Final diagnoses:  None   Discharge Instructions   None    ED Prescriptions    None     Controlled Substance Prescriptions [place] Controlled Substance Registry consulted?    Shelby Mattocks, [place] 04/24/18 1809

## 2018-04-25 LAB — URINE CULTURE
Culture: NO GROWTH
Special Requests: NORMAL

## 2018-05-04 DIAGNOSIS — H00024 Hordeolum internum left upper eyelid: Secondary | ICD-10-CM | POA: Diagnosis not present

## 2018-05-04 DIAGNOSIS — H40033 Anatomical narrow angle, bilateral: Secondary | ICD-10-CM | POA: Diagnosis not present

## 2018-05-09 ENCOUNTER — Ambulatory Visit (INDEPENDENT_AMBULATORY_CARE_PROVIDER_SITE_OTHER): Payer: Medicare Other | Admitting: Internal Medicine

## 2018-05-09 ENCOUNTER — Encounter: Payer: Self-pay | Admitting: Internal Medicine

## 2018-05-09 VITALS — BP 136/76 | HR 66 | Ht 66.0 in | Wt 152.0 lb

## 2018-05-09 DIAGNOSIS — I491 Atrial premature depolarization: Secondary | ICD-10-CM

## 2018-05-09 DIAGNOSIS — I10 Essential (primary) hypertension: Secondary | ICD-10-CM | POA: Diagnosis not present

## 2018-05-09 DIAGNOSIS — R002 Palpitations: Secondary | ICD-10-CM

## 2018-05-09 DIAGNOSIS — I493 Ventricular premature depolarization: Secondary | ICD-10-CM

## 2018-05-09 MED ORDER — FLECAINIDE ACETATE 100 MG PO TABS: 100.0000 mg | ORAL_TABLET | Freq: Two times a day (BID) | ORAL | 3 refills | Status: DC

## 2018-05-09 MED ORDER — FLECAINIDE ACETATE 50 MG PO TABS: ORAL_TABLET | ORAL | 3 refills | Status: DC

## 2018-05-09 NOTE — Patient Instructions (Addendum)

## 2018-05-09 NOTE — Progress Notes (Signed)
HPI [name] returns today for ongoing evaluation of palpitations and very symptomatic PAC's. She has never had documented atrial fib. She developed some hearing difficulties and I switched her to rhythymol which did not control her PAC's and did not change her hearing. She has started back on flecainide on her own and her palpitations resolved. No chest pain or sob. No syncope. Allergies  Allergen Reactions  . Other Anaphylaxis    GI Cocktail  . Pantoprazole Other (See Comments)    Upset stomach and weight loss  . Zyrtec [Cetirizine]     Palpitations     Current Outpatient Medications  Medication Sig Dispense Refill  . Cholecalciferol (VITAMIN D3) 1000 UNITS CAPS Take 1,000 Units by mouth daily as needed (supplemental).     Marland Kitchen Cod Liver Oil CAPS Take 1 capsule by mouth daily.    . flecainide (TAMBOCOR) 50 MG tablet Take 2 tablets in the AM and 1 tablet at night 270 tablet 3   No current facility-administered medications for this visit.      Past Medical History:  Diagnosis Date  . Anemia    iron def  . Anxiety   . Arrhythmia    paroxysmal SVT and symtomatic PAC's  . Diverticulosis    colon  . History of colonic polyps    cecal  . Hypertension   . Osteoporosis 07/21/2011   t score -2.7  . Palpitations 05/24/2017  . Stricture esophagus   . Vitamin D deficiency    2010    ROS:   All systems reviewed and negative except as noted in the HPI.   Past Surgical History:  Procedure Laterality Date  . A&P, enterocele repair     s/p 2003  . ABDOMINAL HYSTERECTOMY    . BREAST REDUCTION SURGERY     s/p 1996      PATIENT DENIES BREAST REDUCTION   . HEMORRHOID SURGERY    . LEFT HEART CATH AND CORONARY ANGIOGRAPHY N/A 05/25/2017   Procedure: LEFT HEART CATH AND CORONARY ANGIOGRAPHY;  Surgeon: Jettie Booze, MD;  Location: [facility];  Service: Cardiovascular;  Laterality: N/A;     Family History  Problem Relation Age of Onset  . Stroke Father      @ 63  . Hypertension Father   . Hypertension Sister   . Cancer Sister 69       brain  . Breast cancer Other 42       breast  . Breast cancer Daughter 70  . Cancer Daughter 41       breast  . Cancer Other 61       colon/MELANOMA  . Stroke Mother        medication induced  . Kidney disease Brother      Social History   Socioeconomic History  . Marital status: Married    Spouse name: Not on file  . Number of children: Not on file  . Years of education: Not on file  . Highest education level: Not on file  Occupational History  . Occupation: Retired  Scientific laboratory technician  . Financial resource strain: Not on file  . Food insecurity:    Worry: Not on file    Inability: Not on file  . Transportation needs:    Medical: Not on file    Non-medical: Not on file  Tobacco Use  . Smoking status: Former Smoker    Last attempt to quit: 05/24/1968    Years since quitting:  49.9  . Smokeless tobacco: Never Used  . Tobacco comment: only smoked 7 years  Substance and Sexual Activity  . Alcohol use: No    Alcohol/week: 0.0 standard drinks  . Drug use: No  . Sexual activity: Never    Comment: 1st intercourse 20 yo-1 partner  Lifestyle  . Physical activity:    Days per week: Not on file    Minutes per session: Not on file  . Stress: Not on file  Relationships  . Social connections:    Talks on phone: Not on file    Gets together: Not on file    Attends religious service: Not on file    Active member of club or organization: Not on file    Attends meetings of clubs or organizations: Not on file    Relationship status: Not on file  . Intimate partner violence:    Fear of current or ex partner: Not on file    Emotionally abused: Not on file    Physically abused: Not on file    Forced sexual activity: Not on file  Other Topics Concern  . Not on file  Social History Narrative  . Not on file     BP 136/76   Pulse 66   Ht 5\' 6"  (1.676 m)   Wt 152 lb (68.9 kg)   LMP 05/24/1970    SpO2 97%   BMI 24.53 kg/m   Physical Exam:  Well appearing NAD HEENT: Unremarkable Neck:  No JVD, no thyromegally Lymphatics:  No adenopathy Back:  No CVA tenderness Lungs:  Clear with no wheezes HEART:  Regular rate rhythm, no murmurs, no rubs, no clicks Abd:  soft, positive bowel sounds, no organomegally, no rebound, no guarding Ext:  2 plus pulses, no edema, no cyanosis, no clubbing Skin:  No rashes no nodules Neuro:  CN II through XII intact, motor grossly intact  EKG - nsr   Assess/Plan: 1. Palpitations - her symptoms are much improved.  2. Decreased hearing - she is undergoing evaluation for a hearing aid. 3. Chest pain - non-cardiac. Resolved.  Mikle Bosworth.D.

## 2018-05-12 DIAGNOSIS — H04123 Dry eye syndrome of bilateral lacrimal glands: Secondary | ICD-10-CM | POA: Diagnosis not present

## 2018-05-15 MED ORDER — FLECAINIDE ACETATE 50 MG PO TABS: ORAL_TABLET | ORAL | 3 refills | Status: DC

## 2018-05-15 NOTE — Addendum Note (Signed)
Addended by: Willeen Cass A on: 05/15/2018 07:48 AM   Modules accepted: Orders

## 2018-05-22 DIAGNOSIS — H00023 Hordeolum internum right eye, unspecified eyelid: Secondary | ICD-10-CM | POA: Diagnosis not present

## 2018-06-03 ENCOUNTER — Encounter (HOSPITAL_COMMUNITY): Payer: Self-pay

## 2018-06-03 ENCOUNTER — Emergency Department (HOSPITAL_COMMUNITY)
Admission: EM | Admit: 2018-06-03 | Discharge: 2018-06-03 | Disposition: A | Discharge status: Home / Self Care | Payer: Medicare Other | Attending: Emergency Medicine | Admitting: Emergency Medicine

## 2018-06-03 ENCOUNTER — Emergency Department (HOSPITAL_COMMUNITY): Payer: Medicare Other

## 2018-06-03 DIAGNOSIS — Z79899 Other long term (current) drug therapy: Secondary | ICD-10-CM | POA: Diagnosis not present

## 2018-06-03 DIAGNOSIS — Z87891 Personal history of nicotine dependence: Secondary | ICD-10-CM | POA: Diagnosis not present

## 2018-06-03 DIAGNOSIS — I4891 Unspecified atrial fibrillation: Secondary | ICD-10-CM | POA: Diagnosis not present

## 2018-06-03 DIAGNOSIS — R0602 Shortness of breath: Secondary | ICD-10-CM | POA: Diagnosis not present

## 2018-06-03 DIAGNOSIS — Z85828 Personal history of other malignant neoplasm of skin: Secondary | ICD-10-CM | POA: Diagnosis not present

## 2018-06-03 DIAGNOSIS — I10 Essential (primary) hypertension: Secondary | ICD-10-CM | POA: Diagnosis not present

## 2018-06-03 DIAGNOSIS — R509 Fever, unspecified: Secondary | ICD-10-CM | POA: Diagnosis not present

## 2018-06-03 DIAGNOSIS — R079 Chest pain, unspecified: Secondary | ICD-10-CM | POA: Diagnosis not present

## 2018-06-03 DIAGNOSIS — R0902 Hypoxemia: Secondary | ICD-10-CM | POA: Diagnosis not present

## 2018-06-03 DIAGNOSIS — R109 Unspecified abdominal pain: Secondary | ICD-10-CM | POA: Diagnosis not present

## 2018-06-03 DIAGNOSIS — R002 Palpitations: Secondary | ICD-10-CM | POA: Insufficient documentation

## 2018-06-03 DIAGNOSIS — J189 Pneumonia, unspecified organism: Secondary | ICD-10-CM | POA: Diagnosis not present

## 2018-06-03 DIAGNOSIS — R0989 Other specified symptoms and signs involving the circulatory and respiratory systems: Secondary | ICD-10-CM | POA: Diagnosis not present

## 2018-06-03 LAB — URINALYSIS, ROUTINE W REFLEX MICROSCOPIC
Bilirubin Urine: NEGATIVE
Glucose, UA: NEGATIVE mg/dL
Hgb urine dipstick: NEGATIVE
Ketones, ur: NEGATIVE mg/dL
Leukocytes, UA: NEGATIVE
Nitrite: NEGATIVE
Protein, ur: NEGATIVE mg/dL
Specific Gravity, Urine: 1.005 (ref 1.005–1.030)
pH: 9 — ABNORMAL HIGH (ref 5.0–8.0)

## 2018-06-03 LAB — TSH: TSH: 3.712 u[IU]/mL (ref 0.350–4.500)

## 2018-06-03 LAB — CBC WITH DIFFERENTIAL/PLATELET
Abs Immature Granulocytes: 0 10*3/uL (ref 0.00–0.07)
Basophils Absolute: 0 10*3/uL (ref 0.0–0.1)
Basophils Relative: 1 %
Eosinophils Absolute: 0 10*3/uL (ref 0.0–0.5)
Eosinophils Relative: 1 %
HCT: 41.7 % (ref 36.0–46.0)
Hemoglobin: 13.6 g/dL (ref 12.0–15.0)
Immature Granulocytes: 0 %
Lymphocytes Relative: 39 %
Lymphs Abs: 1.9 10*3/uL (ref 0.7–4.0)
MCH: 30.4 pg (ref 26.0–34.0)
MCHC: 32.6 g/dL (ref 30.0–36.0)
MCV: 93.3 fL (ref 80.0–100.0)
Monocytes Absolute: 0.5 10*3/uL (ref 0.1–1.0)
Monocytes Relative: 11 %
Neutro Abs: 2.3 10*3/uL (ref 1.7–7.7)
Neutrophils Relative %: 48 %
Platelets: 268 10*3/uL (ref 150–400)
RBC: 4.47 MIL/uL (ref 3.87–5.11)
RDW: 13.1 % (ref 11.5–15.5)
WBC: 4.8 10*3/uL (ref 4.0–10.5)
nRBC: 0 % (ref 0.0–0.2)

## 2018-06-03 LAB — COMPREHENSIVE METABOLIC PANEL
ALT: 11 U/L (ref 0–44)
AST: 24 U/L (ref 15–41)
Albumin: 4.4 g/dL (ref 3.5–5.0)
Alkaline Phosphatase: 65 U/L (ref 38–126)
Anion gap: 11 (ref 5–15)
BUN: 17 mg/dL (ref 8–23)
CO2: 25 mmol/L (ref 22–32)
Calcium: 9.9 mg/dL (ref 8.9–10.3)
Chloride: 104 mmol/L (ref 98–111)
Creatinine, Ser: 0.79 mg/dL (ref 0.44–1.00)
GFR calc Af Amer: >60 mL/min (ref >60)
GFR calc non Af Amer: >60 mL/min (ref >60)
Glucose, Bld: 99 mg/dL (ref 70–99)
Potassium: 3.8 mmol/L (ref 3.5–5.1)
Sodium: 140 mmol/L (ref 135–145)
Total Bilirubin: 0.6 mg/dL (ref 0.3–1.2)
Total Protein: 7.6 g/dL (ref 6.5–8.1)

## 2018-06-03 LAB — I-STAT TROPONIN, ED
Troponin i, poc: 0 ng/mL (ref 0.00–0.08)
Troponin i, poc: 0.01 ng/mL (ref 0.00–0.08)

## 2018-06-03 LAB — I-STAT CG4 LACTIC ACID, ED
Lactic Acid, Venous: 1.9 mmol/L (ref 0.5–1.9)
Lactic Acid, Venous: 0.75 mmol/L (ref 0.5–1.9)

## 2018-06-03 LAB — LIPASE, BLOOD: Lipase: 33 U/L (ref 11–51)

## 2018-06-03 LAB — MAGNESIUM: Magnesium: 2.3 mg/dL (ref 1.7–2.4)

## 2018-06-03 MED ORDER — SODIUM CHLORIDE 0.9 % IV BOLUS
500.0000 mL | Freq: Once | INTRAVENOUS | Status: AC
  Administered 2018-06-03: 500 mL via INTRAVENOUS

## 2018-06-03 NOTE — ED Provider Notes (Signed)
[facility] EMERGENCY DEPARTMENT Provider Note   CSN: [id number] Arrival date & time: 06/03/18  1753     History   Chief Complaint Chief Complaint  Patient presents with  . Fever  . Atrial Fibrillation    HPI [name] [name] is a 85 y.o. female.  The history is provided by the patient, the EMS personnel and medical records. No language interpreter was used.  Fever  Max temp prior to arrival:  100 Temp source:  Oral Severity:  Moderate Onset quality:  Gradual Duration:  1 day Timing:  Constant Progression:  Waxing and waning Chronicity:  New Relieved by:  Nothing Worsened by:  Nothing Ineffective treatments:  None tried Associated symptoms: chills and dysuria   Associated symptoms: no chest pain, no congestion, no cough, no diarrhea, no headaches, no nausea, no rash, no rhinorrhea, no somnolence, no sore throat and no vomiting   Dysuria:    Severity:  Severe   Onset quality:  Gradual   Duration:  2 weeks   Timing:  Constant   Progression:  Waxing and waning   Chronicity:  Recurrent   Past Medical History:  Diagnosis Date  . Anemia    iron def  . Anxiety   . Arrhythmia    paroxysmal SVT and symtomatic PAC's  . Diverticulosis    colon  . History of colonic polyps    cecal  . Hypertension   . Osteoporosis 07/21/2011   t score -2.7  . Palpitations 05/24/2017  . Stricture esophagus   . Vitamin D deficiency    2010    Patient Active Problem List   Diagnosis Date Noted  . Chest tightness   . Elevated troponin   . Chest pain 05/23/2017  . Palpitations 05/23/2017  . Rotator cuff arthropathy, right 04/08/2016  . Abdominal discomfort 04/01/2016  . Shoulder pain 03/25/2016  . Arthritis of knee 03/25/2016  . Chest pain on breathing 03/14/2016  . Presbycusis of both ears 01/15/2016  . Tinnitus of right ear 01/15/2016  . Lipoma 02/04/2015  . Skin cancer of arm 09/26/2014  . Chest discomfort 01/20/2014  . Palpitation 09/28/2012  . Allergic  rhinitis 12/25/2011  . TIA (transient ischemic attack) 07/19/2011  . ATRIAL PREMATURE BEATS 03/20/2009  . ANEMIA-IRON DEFICIENCY 04/02/2008  . Anxiety state 04/02/2008  . Essential hypertension 04/02/2008  . GERD 04/02/2008  . OSTEOPOROSIS 04/02/2008    Past Surgical History:  Procedure Laterality Date  . A&P, enterocele repair     s/p 2003  . ABDOMINAL HYSTERECTOMY    . BREAST REDUCTION SURGERY     s/p 1996      PATIENT DENIES BREAST REDUCTION   . HEMORRHOID SURGERY    . LEFT HEART CATH AND CORONARY ANGIOGRAPHY N/A 05/25/2017   Procedure: LEFT HEART CATH AND CORONARY ANGIOGRAPHY;  Surgeon: Jettie Booze, MD;  Location: [facility];  Service: Cardiovascular;  Laterality: N/A;     OB History    Gravida  2   Para  2   Term      Preterm      AB      Living  1     SAB      TAB      Ectopic      Multiple      Live Births               Home Medications    Prior to Admission medications   Medication Sig Start Date End  Date Taking? Authorizing Provider  Cholecalciferol (VITAMIN D3) 1000 UNITS CAPS Take 1,000 Units by mouth daily as needed (supplemental).     [provider]  [facility] Liver Oil CAPS Take 1 capsule by mouth daily.    [provider]  flecainide (TAMBOCOR) 50 MG tablet Take 2 tablets in the AM and 1 tablet at night 05/15/18   Evans Lance, MD    Family History Family History  Problem Relation Age of Onset  . Stroke Father        @ 23  . Hypertension Father   . Hypertension Sister   . Cancer Sister 29       brain  . Breast cancer Other 42       breast  . Breast cancer Daughter 89  . Cancer Daughter 50       breast  . Cancer Other 75       colon/MELANOMA  . Stroke Mother        medication induced  . Kidney disease Brother     Social History Social History   Tobacco Use  . Smoking status: Former Smoker    Last attempt to quit: 05/24/1968    Years since quitting: 50.0  . Smokeless tobacco: Never Used    . Tobacco comment: only smoked 7 years  Substance Use Topics  . Alcohol use: No    Alcohol/week: 0.0 standard drinks  . Drug use: No     Allergies   Other; Pantoprazole; and Zyrtec [cetirizine]   Review of Systems Review of Systems  Constitutional: Positive for chills, fatigue and fever. Negative for diaphoresis.  HENT: Negative for congestion, rhinorrhea and sore throat.   Respiratory: Positive for chest tightness and shortness of breath. Negative for cough, choking, wheezing and stridor.   Cardiovascular: Positive for palpitations. Negative for chest pain and leg swelling.  Gastrointestinal: Positive for abdominal pain (epigastric). Negative for diarrhea, nausea and vomiting.  Genitourinary: Positive for dysuria and frequency.  Musculoskeletal: Negative for back pain, neck pain and neck stiffness.  Skin: Negative for rash.  Neurological: Negative for dizziness, light-headedness and headaches.  Psychiatric/Behavioral: Negative for agitation.  All other systems reviewed and are negative.    Physical Exam Updated Vital Signs BP (!) 164/77   Pulse 82   Temp 98.4 F (36.9 C) (Rectal)   Resp 13   Ht 5\' 6"  (1.676 m)   Wt 65.8 kg   LMP 05/24/1970   SpO2 99%   BMI 23.40 kg/m   Physical Exam Vitals signs reviewed.  Constitutional:      General: She is not in acute distress.    Appearance: Normal appearance. She is not ill-appearing, toxic-appearing or diaphoretic.  HENT:     Head: Normocephalic.     Nose: No congestion or rhinorrhea.     Mouth/Throat:     Pharynx: No oropharyngeal exudate or posterior oropharyngeal erythema.  Eyes:     Conjunctiva/sclera: Conjunctivae normal.     Pupils: Pupils are equal, round, and reactive to light.  Neck:     Musculoskeletal: No neck rigidity or muscular tenderness.  Cardiovascular:     Rate and Rhythm: Normal rate.     Pulses: Normal pulses.     Heart sounds: No murmur.  Pulmonary:     Effort: Pulmonary effort is normal.      Breath sounds: No stridor. No wheezing, rhonchi or rales.  Chest:     Chest wall: No tenderness.  Abdominal:     General: Abdomen is  flat. There is no distension.  Musculoskeletal:        General: No tenderness.     Right lower leg: No edema.     Left lower leg: No edema.  Skin:    Capillary Refill: Capillary refill takes less than 2 seconds.  Neurological:     General: No focal deficit present.     Mental Status: She is alert and oriented to person, place, and time.  Psychiatric:        Mood and Affect: Mood normal.      ED Treatments / Results  Labs (all labs ordered are listed, but only abnormal results are displayed) Labs Reviewed  URINALYSIS, ROUTINE W REFLEX MICROSCOPIC - Abnormal; Notable for the following components:      Result Value   Color, Urine STRAW (*)    pH 9.0 (*)    All other components within normal limits  URINE CULTURE  CBC WITH DIFFERENTIAL/PLATELET  COMPREHENSIVE METABOLIC PANEL  LIPASE, BLOOD  MAGNESIUM  TSH  I-STAT CG4 LACTIC ACID, ED  I-STAT TROPONIN, ED  I-STAT CG4 LACTIC ACID, ED  I-STAT TROPONIN, ED    EKG EKG Interpretation  Date/Time:  Saturday June 03 2018 18:14:43 EST Ventricular Rate:  84 PR Interval:    QRS Duration: 114 QT Interval:  373 QTC Calculation: 441 R Axis:   68 Text Interpretation:  Sinus rhythm Prolonged PR interval Borderline intraventricular conduction delay When compared to prior, mno significant changes seen.  No STEMI Confirmed by Antony Blackbird [phone]) on 06/03/2018 6:32:27 PM   Radiology Dg Chest 2 View  Result Date: 06/03/2018 CLINICAL DATA:  Fever chills short of breath EXAM: CHEST - 2 VIEW COMPARISON:  05/23/2017 FINDINGS: No acute opacity or pleural effusion. Normal cardiomediastinal silhouette with aortic atherosclerosis. No pneumothorax. IMPRESSION: No active cardiopulmonary disease. Electronically Signed   By: Donavan Foil M.D.   On: 06/03/2018 19:07    Procedures Procedures (including  critical care time)  Medications Ordered in ED Medications  sodium chloride 0.9 % bolus 500 mL (0 mLs Intravenous Stopped 06/03/18 2230)     Initial Impression / Assessment and Plan / ED Course  I have reviewed the triage vital signs and the nursing notes.  Pertinent labs & imaging results that were available during my care of the patient were reviewed by me and considered in my medical decision making (see chart for details).     [name] [name] is a 85 y.o. female with a past medical history significant for arrhythmia and symptomatic palpitations on flecainide, hypertension, diverticulosis, prior esophageal stricture, and osteoporosis who presents with fevers, chills, dysuria, and symptomatic palpitations/shortness of breath.  Patient reports that she has had dysuria for the last few weeks.  She says this is continuing.  She says that she is having fatigue, malaise, fevers, and chills.  She has felt warm.  She reports that today she started having recurrent palpitations and runs of feeling arrhythmia.  She reports that this is been on and off all day.  She reports that she is allowed to take 4 doses of flecainide when she has symptoms and has taken 3 of them already.  She reports is not having ongoing chest discomfort or chest pain.  She reports that she is having some shortness of breath when she feels it.  She reports some abdominal aching in her epigastrium when she is having the palpitations.  She reports no nausea, vomiting, diaphoresis.  She denies any constipation or diarrhea.  She had normal  bowel movement today and is passing gas normally.  No reported rectal bleeding.  She denies any recent medication changes or other complaints.  On exam, patient's lungs are clear.  Chest is nontender.  Abdomen is nontender.  Patient has symmetric radial pulses.  Pulses are regular on palpation.  Legs are nonedematous and nontender.  Patient has palpable DP pulses.  Patient resting comfortably on room air  initially.  Patient is warm to the touch.  Patient will have a rectal temp obtained as I am still concerned about infection.  Clinically I am concerned about UTI or pyelonephritis causing her symptoms.  Due to the symptomatic palpitations, patient will have labs, chest x-ray for the shortness of breath, and cardiac monitoring/EKG.  Anticipate reassessment after work-up.  Diagnostic work-up was overall reassuring.  Patient had no further palpitations while here in the emergency department.  Urinalysis shows no infection and patient's work-up does not show anything that would require admission at this time.  Patient reports she will call her PCP and cardiologist and get seen in the next several days.  Patient agrees with discharge.  Patient was feeling better after some fluids.  Patient had no other questions or concerns and was discharged in good condition.  Final Clinical Impressions(s) / ED Diagnoses   Final diagnoses:  Palpitation  Shortness of breath    ED Discharge Orders    None     Clinical Impression: 1. Palpitation   2. Shortness of breath     Disposition: Discharge  Condition: Good  I have discussed the results, Dx and Tx plan with the pt(& family if present). He/she/they expressed understanding and agree(s) with the plan. Discharge instructions discussed at great length. Strict return precautions discussed and pt &/or family have verbalized understanding of the instructions. No further questions at time of discharge.    Discharge Medication List as of 06/03/2018 10:42 PM      Follow Up: Evans Lance, MD [address] [address] [phone]     [place] [address] [id number] mc [place] [phone]       Tegeler, Gwenyth Allegra, MD 06/04/18 [phone]

## 2018-06-03 NOTE — ED Notes (Signed)
Patient transported to X-ray 

## 2018-06-03 NOTE — Discharge Instructions (Signed)
Your work-up today was overall reassuring.  We would feel you are safe to go home.  Please follow-up with your primary doctor and your cardiology team.  If any symptoms change or worsen, is return to the nearest emergency department.

## 2018-06-03 NOTE — ED Triage Notes (Signed)
Pt from home via ems; c/o fever off and on today; had flu vaccine this year and pneumonia vaccine 2 years ago; hx afib, states she has felt fluttering in  Her chest today and has had some sob; pt took 3 flecanide at home; endorses dysuria and upper abd pain, normal BM today  194/94 HR 92 irreg 99%RA CBG 126 RR 18

## 2018-06-05 LAB — URINE CULTURE

## 2018-06-14 ENCOUNTER — Telehealth: Payer: Self-pay | Admitting: *Deleted

## 2018-06-14 ENCOUNTER — Ambulatory Visit (INDEPENDENT_AMBULATORY_CARE_PROVIDER_SITE_OTHER): Payer: Medicare Other | Admitting: Physician Assistant

## 2018-06-14 ENCOUNTER — Encounter: Payer: Self-pay | Admitting: Physician Assistant

## 2018-06-14 VITALS — BP 138/74 | HR 72 | Ht 64.5 in | Wt 150.0 lb

## 2018-06-14 DIAGNOSIS — R002 Palpitations: Secondary | ICD-10-CM | POA: Diagnosis not present

## 2018-06-14 DIAGNOSIS — R1906 Epigastric swelling, mass or lump: Secondary | ICD-10-CM

## 2018-06-14 DIAGNOSIS — R1013 Epigastric pain: Secondary | ICD-10-CM

## 2018-06-14 DIAGNOSIS — I493 Ventricular premature depolarization: Secondary | ICD-10-CM

## 2018-06-14 DIAGNOSIS — I491 Atrial premature depolarization: Secondary | ICD-10-CM

## 2018-06-14 NOTE — Progress Notes (Signed)
Subjective:    Patient ID: [name] [name], female    DOB: 03-11-1930, 85 y.o.   MRN: [id number]  HPI Antonietta is a pleasant 85 year old female established with Dr. Ardis Hughs.  She was last seen in our office about a year ago with very similar complaints that she comes in with today.  At that time she was complaining of recurrent episodes of epigastric/upper abdominal discomfort associated with a pulsating or heartbeat type of feeling in her upper abdomen.  At that time we did CT of the abdomen and pelvis which was negative other than extensive diverticulosis, and referred her to cardiology. She had had a recent hospitalization with palpitations and PSVT.  She had cardiac catheterization done in 2019 which was negative.  She has been followed by Dr. Lovena Le and has been on flecainide for years. Patient saw Dr. Lovena Le in December 2019 but at that time apparently had been feeling okay. She comes in today stating that she has been having very frequent recurrent episodes over the past month.  She went to the emergency room on June 03, 2018, did not have any extensive work-up done at that time and was asked to follow-up with cardiology and her PCP. Patient says she had an episode this past weekend that was "severe".  She says she gets some pressure type of discomfort in the epigastrium and the feeling of being able to feel her heartbeat in her lower chest/upper abdomen with these episodes.  She says she immediately feels bad and has to sit down.  She has not had dizziness and has not had syncope.  Usually after sitting for several minutes symptoms will resolve and usually completely dissipate within half an hour.  She denies diaphoresis or shortness of breath with these episodes.  On further questioning it sounds as if most of them happen while she is up and being active no she has had occasions with onset after eating which has made her wonder if it is associated with her esophagus or stomach.  She had an episode  yesterday when she had gone down the stairs to the basement.  She says she "thought she was going to die" Again this episode was with the exact same symptoms and after sitting down and resting resolved within about 20 minutes. She denies any ongoing abdominal pain, no dysphagia or odynophagia.  No regular heartburn or indigestion.  No dysphagia or odynophagia.  She does have a full sensation in her upper abdomen she also feels with bending over at times. She had been given trials of empiric acid blockers in the past and did not tolerate any of them.  Review of Systems Pertinent positive and negative review of systems were noted in the above HPI section.  All other review of systems was otherwise negative.  Outpatient Encounter Medications as of 06/14/2018  Medication Sig  . aspirin EC 325 MG tablet Take 325 mg by mouth daily as needed (headache).  [employer] Cod Liver Oil CAPS Take 1 capsule by mouth daily.  . flecainide (TAMBOCOR) 50 MG tablet Take 2 tablets in the AM and 1 tablet at night (Patient taking differently: Take 50-100 mg by mouth See admin instructions. Take 2 tablets (200 mg) by mouth every morning and 1 tablet (50 mg) at night)  . SALINE NASAL SPRAY NA Place 1 spray into both nostrils every other day. [employer] saline nasal spray   No facility-administered encounter medications on file as of 06/14/2018.    Allergies  Allergen Reactions  .  Other Anaphylaxis    Reaction to GI Cocktail  . Pantoprazole Other (See Comments)    Upset stomach and weight loss  . Zyrtec [Cetirizine] Palpitations   Patient Active Problem List   Diagnosis Date Noted  . Chest tightness   . Elevated troponin   . Chest pain 05/23/2017  . Palpitations 05/23/2017  . Rotator cuff arthropathy, right 04/08/2016  . Abdominal discomfort 04/01/2016  . Shoulder pain 03/25/2016  . Arthritis of knee 03/25/2016  . Chest pain on breathing 03/14/2016  . Presbycusis of both ears 01/15/2016  . Tinnitus of right ear  01/15/2016  . Lipoma 02/04/2015  . Skin cancer of arm 09/26/2014  . Chest discomfort 01/20/2014  . Palpitation 09/28/2012  . Allergic rhinitis 12/25/2011  . TIA (transient ischemic attack) 07/19/2011  . ATRIAL PREMATURE BEATS 03/20/2009  . ANEMIA-IRON DEFICIENCY 04/02/2008  . Anxiety state 04/02/2008  . Essential hypertension 04/02/2008  . GERD 04/02/2008  . OSTEOPOROSIS 04/02/2008   Social History   Socioeconomic History  . Marital status: Married    Spouse name: Not on file  . Number of children: Not on file  . Years of education: Not on file  . Highest education level: Not on file  Occupational History  . Occupation: Retired  Scientific laboratory technician  . Financial resource strain: Not on file  . Food insecurity:    Worry: Not on file    Inability: Not on file  . Transportation needs:    Medical: Not on file    Non-medical: Not on file  Tobacco Use  . Smoking status: Former Smoker    Last attempt to quit: 05/24/1968    Years since quitting: 50.0  . Smokeless tobacco: Never Used  . Tobacco comment: only smoked 7 years  Substance and Sexual Activity  . Alcohol use: No    Alcohol/week: 0.0 standard drinks  . Drug use: No  . Sexual activity: Never    Comment: 1st intercourse 67 yo-1 partner  Lifestyle  . Physical activity:    Days per week: Not on file    Minutes per session: Not on file  . Stress: Not on file  Relationships  . Social connections:    Talks on phone: Not on file    Gets together: Not on file    Attends religious service: Not on file    Active member of club or organization: Not on file    Attends meetings of clubs or organizations: Not on file    Relationship status: Not on file  . Intimate partner violence:    Fear of current or ex partner: Not on file    Emotionally abused: Not on file    Physically abused: Not on file    Forced sexual activity: Not on file  Other Topics Concern  . Not on file  Social History Narrative  . Not on file    Ms. Holdman's  family history includes Breast cancer (age of onset: 48) in an other family member; Breast cancer (age of onset: 58) in her daughter; Cancer (age of onset: 37) in an other family member; Cancer (age of onset: 71) in her daughter; Cancer (age of onset: 34) in her sister; Hypertension in her father and sister; Kidney disease in her brother; Stroke in her father and mother.      Objective:    Vitals:   06/14/18 0821  BP: 138/74  Pulse: 72    Physical Exam; well-developed elderly white female in no acute distress, pleasant, anxious.  Height  5 foot 4, weight 150, BMI 25.3.  HEENT; nontraumatic normocephalic EOMI PERRLA sclera anicteric, oral mucosa moist, Cardiovascular; regular rate and rhythm with S1-S2.  Pulmonary ;clear bilaterally, Abdomen; soft, she has some minimal tenderness in the epigastrium there is no guarding or rebound no palpable mass or hepatosplenomegaly, bowel sounds are present.  Rectal; exam not done, Extremities ;no clubbing cyanosis or edema skin warm and dry, Neuropsych; alert and oriented, grossly nonfocal mood and affect appropriate       Assessment & Plan:   #68 85 year old female who comes in with recurrent episodes of upper abdominal/epigastric pressure associated with a sense of a pounding heartbeat in her upper abdomen lower chest.  These episodes mediately make her feel bad and she has to sit down.  Symptoms may last for 20 to 30 minutes and then gradually resolve.  She has not had any syncope diaphoresis or shortness of breath. Patient has history of PSVT, and PVCs. Negative cardiac cath 2019  I do not think that her current symptoms are of GI etiology and expect symptoms are secondary to current episodes of arrhythmia. Patient has some difficulty understanding how cardiac symptoms would be felt in the epigastrium.  #2 history of hypertension #3.  History of TIA #4 History of anxiety #5.  Hysterectomy #6.  Diverticulosis  Plan; Long discussion with the  patient today regarding symptoms. Will schedule for barium swallow and upper GI rule out any significant upper gut pathology. Patient was scheduled for a semiurgent work in cardiology appointment with Dr. Tanna Furry office.  I think she would benefit from Holter monitoring, versus stress test to induce symptoms.  Jencarlos Nicolson S Dquan Cortopassi PA-C 06/14/2018   Cc: Evans Lance, MD

## 2018-06-14 NOTE — Telephone Encounter (Signed)
Lm for the patient advising I called Dr. Hillery Jacks office about trying to her her an appointment there per [facility]'s recommendations. I spoke to [place] there and she is having Dr. Tanna Furry nurse look into this.

## 2018-06-14 NOTE — Progress Notes (Signed)
I agree with the above note, plan 

## 2018-06-14 NOTE — Telephone Encounter (Signed)
Received a call from [place].  (pt having a barium swallow and upper GI exam this Friday) Pam, CMA for [facility], calls in requesting appt for pt.  Pt seen this morning reporting more frequent & symptomatic palpitations since seeing Dr. Lovena Le last month.  They are requesting pt be seen this week/beginning of next. Made them aware I would forward this information to [place] who will f/u w/ pt and determine next step.

## 2018-06-14 NOTE — Patient Instructions (Signed)
You have been scheduled for a Barium Esophogram at [facility] (1st floor of the hospital) on Friday 06-16-18 at 8:00 am. Please arrive 15 minutes prior to your appointment for registration. Make certain not to have anything to eat or drink after midnight prior to your test. If you need to reschedule for any reason, please contact radiology at [phone] to do so. __________________________________________________________________ A barium swallow is an examination that concentrates on views of the esophagus. This tends to be a double contrast exam (barium and two liquids which, when combined, create a gas to distend the wall of the oesophagus) or single contrast (non-ionic iodine based). The study is usually tailored to your symptoms so a good history is essential. Attention is paid during the study to the form, structure and configuration of the esophagus, looking for functional disorders (such as aspiration, dysphagia, achalasia, motility and reflux) EXAMINATION You may be asked to change into a gown, depending on the type of swallow being performed. A radiologist and radiographer will perform the procedure. The radiologist will advise you of the type of contrast selected for your procedure and direct you during the exam. You will be asked to stand, sit or lie in several different positions and to hold a small amount of fluid in your mouth before being asked to swallow while the imaging is performed .In some instances you may be asked to swallow barium coated marshmallows to assess the motility of a solid food bolus. The exam can be recorded as a digital or video fluoroscopy procedure. POST PROCEDURE It will take 1-2 days for the barium to pass through your system. To facilitate this, it is important, unless otherwise directed, to increase your fluids for the next 24-48hrs and to resume your normal diet.  This test typically takes about 30 minutes to  perform.   ________________________________________________________________ An upper GI series uses x rays to help diagnose problems of the upper GI tract, which includes the esophagus, stomach, and duodenum. The duodenum is the first part of the small intestine. An upper GI series is conducted by a radiology technologist or a radiologist-a doctor who specializes in x-ray imaging-at a hospital or outpatient center. While sitting or standing in front of an x-ray machine, the patient drinks barium liquid, which is often white and has a chalky consistency and taste. The barium liquid coats the lining of the upper GI tract and makes signs of disease show up more clearly on x rays. X-ray video, called fluoroscopy, is used to view the barium liquid moving through the esophagus, stomach, and duodenum. Additional x rays and fluoroscopy are performed while the patient lies on an x-ray table. To fully coat the upper GI tract with barium liquid, the technologist or radiologist may press on the abdomen or ask the patient to change position. Patients hold still in various positions, allowing the technologist or radiologist to take x rays of the upper GI tract at different angles. If a technologist conducts the upper GI series, a radiologist will later examine the images to look for problems.  This test typically takes about 1 hour to complete.  Normal BMI (Body Mass Index- based on height and weight) is between 23 and 30. Your BMI today is Body mass index is 25.35 kg/m. Marland Kitchen Please consider follow up  regarding your BMI with your Primary Care Provider. __________________________________________________________________ __________________________________________________________________________________

## 2018-06-15 NOTE — Telephone Encounter (Signed)
I will be out of office tomorrow . What she describes to me sounds like episodes of palpitations , arrythmia of some sort .ibuprofen know she had a negative cath in 2019 . I think she needs Holter monitoring to further assess. Not convinced this is all anxiety, as she always describes a pounding , pulsation feeling in lower chest/ epigastrium , associated , makes her weak , has to immediately sit down . If you prove normal rhythm  during episodes , then can treat as anxiety- Please review my note , thanks  Will cc Dr Lovena Le as well

## 2018-06-15 NOTE — Telephone Encounter (Signed)
Returned call to Pt.  Pt very anxious.  States she feels something that starts in her stomach, feels like a heartbeat in her stomach then moves up to her heart and she feels her heart skipping.  Complains of stomach fullness.  She has been taking flecainide 100 mg in the AM and 50 mg in the PM.  She states feeling started today and she took a half of her husbands lorazapam(?) type drug with 2 baby aspirins and it stopped.  Pt had a cardiac catheterization January 2019.  Asked Pt to call her PCP to discuss starting medication for anxiety.  Will discuss further medication changes with Dr. Lovena Le 06/16/2018.  Per Dr. Lovena Le on 06/14/2018-Pt to follow up with him in 3 months.

## 2018-06-16 ENCOUNTER — Ambulatory Visit (HOSPITAL_COMMUNITY)
Admission: RE | Admit: 2018-06-16 | Discharge: 2018-06-16 | Disposition: A | Discharge status: Home / Self Care | Payer: Medicare Other | Source: Ambulatory Visit | Attending: Physician Assistant | Admitting: Physician Assistant

## 2018-06-16 ENCOUNTER — Other Ambulatory Visit: Payer: Self-pay | Admitting: Physician Assistant

## 2018-06-16 ENCOUNTER — Encounter (HOSPITAL_COMMUNITY): Payer: Self-pay

## 2018-06-16 DIAGNOSIS — R1013 Epigastric pain: Secondary | ICD-10-CM | POA: Insufficient documentation

## 2018-06-16 DIAGNOSIS — I493 Ventricular premature depolarization: Secondary | ICD-10-CM | POA: Insufficient documentation

## 2018-06-16 DIAGNOSIS — R1010 Upper abdominal pain, unspecified: Secondary | ICD-10-CM | POA: Diagnosis not present

## 2018-06-16 DIAGNOSIS — R1906 Epigastric swelling, mass or lump: Secondary | ICD-10-CM

## 2018-06-16 NOTE — Telephone Encounter (Signed)
Call placed to Pt.  Per Dr. Lovena Le order 48 hour holter monitor.    Advised Pt of order.  Notified Pt she would be contacted early next week to schedule.  Pt indicates understanding.

## 2018-06-16 NOTE — Telephone Encounter (Signed)
See note from Nicoletta Ba PA dated 1-23-202. She had cc'd Dr. Cristopher Peru as well.

## 2018-06-22 NOTE — Telephone Encounter (Signed)
Agree. GT 

## 2018-07-03 ENCOUNTER — Ambulatory Visit (INDEPENDENT_AMBULATORY_CARE_PROVIDER_SITE_OTHER): Payer: Medicare Other

## 2018-07-03 DIAGNOSIS — I491 Atrial premature depolarization: Secondary | ICD-10-CM

## 2018-07-03 DIAGNOSIS — I493 Ventricular premature depolarization: Secondary | ICD-10-CM | POA: Diagnosis not present

## 2018-07-03 DIAGNOSIS — R002 Palpitations: Secondary | ICD-10-CM | POA: Diagnosis not present

## 2018-07-19 IMAGING — CT CT ABD-PELV W/ CM
2 of 5 series · 16 of 46 positions shown, 18 images · IV contrast (ISOVUE 300)
Comparison: CT the abdomen and pelvis 04/20/2016.

CLINICAL DATA: 87-year-old female with history of epigastric pain
intermittently for the past several months.

EXAM:
CT ABDOMEN AND PELVIS WITH CONTRAST
TECHNIQUE: Multidetector CT imaging of the abdomen and pelvis was performed
using the standard protocol following bolus administration of
intravenous contrast.
CONTRAST:  100mL UFIZ3R-ZJJ IOPAMIDOL (UFIZ3R-ZJJ) INJECTION 61%

[Series 2: abd/pel w · axial · 0.69mm/px · z∈[-454,-94]mm · 13 of 81 slices shown, 15 images]
[im 5/81  soft-tissue]
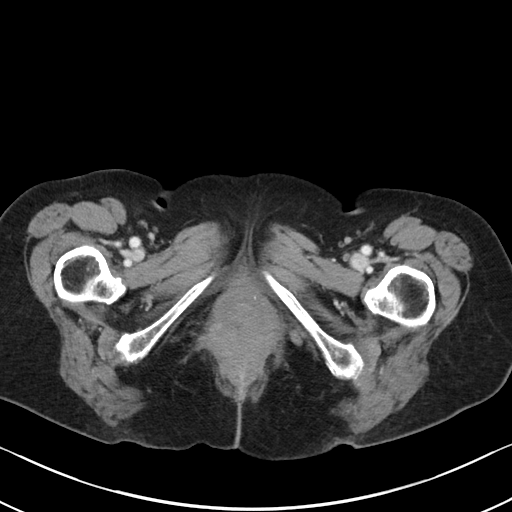
[im 5/81  bone]
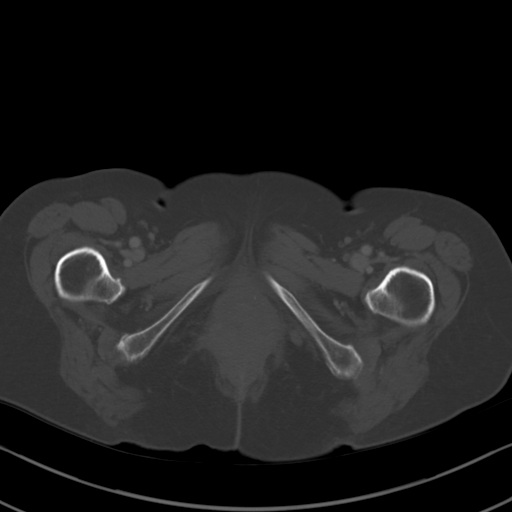
[im 13/81  soft-tissue]
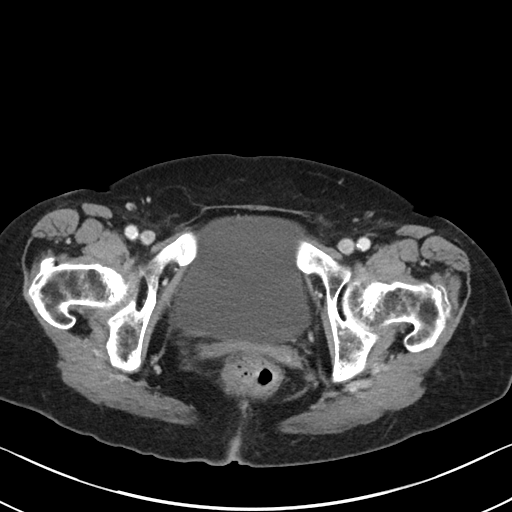
[im 17/81  soft-tissue]
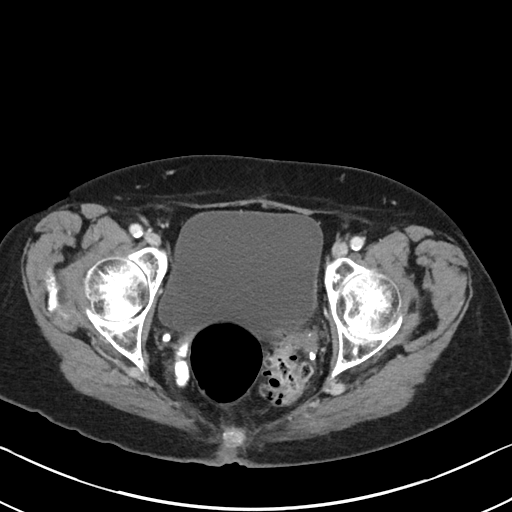
[im 25/81  soft-tissue]
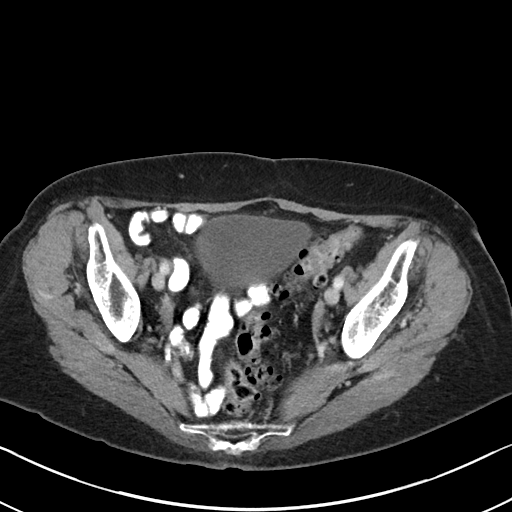
[im 29/81  soft-tissue]
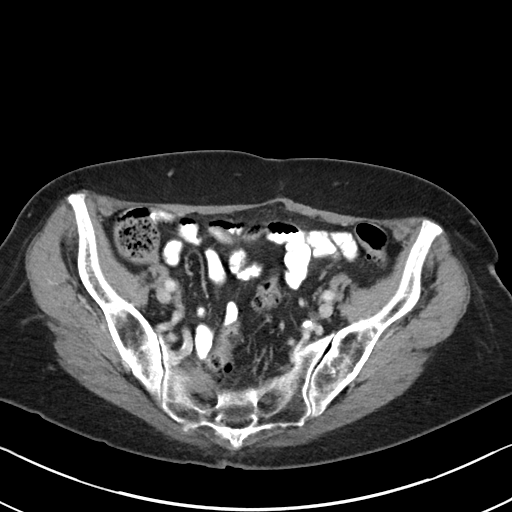
[im 37/81  soft-tissue]
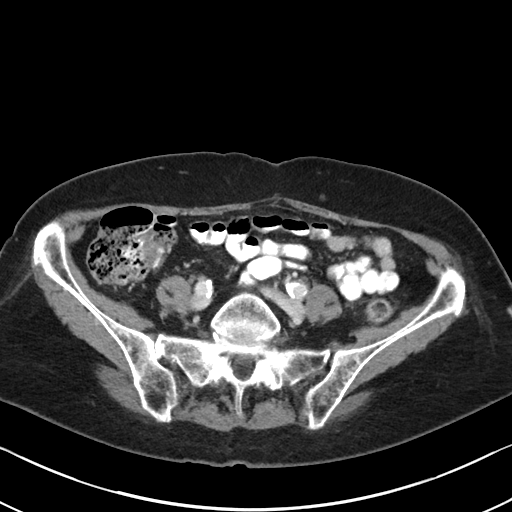
[im 41/81  soft-tissue]
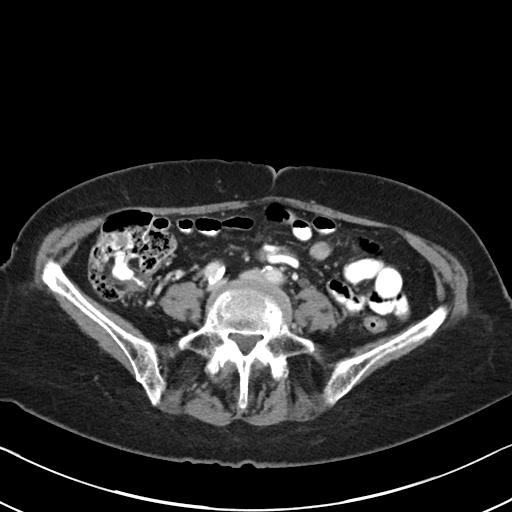
[im 45/81  soft-tissue]
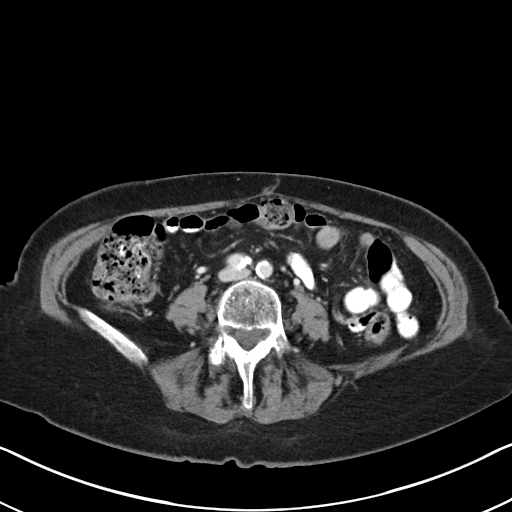
[im 53/81  soft-tissue]
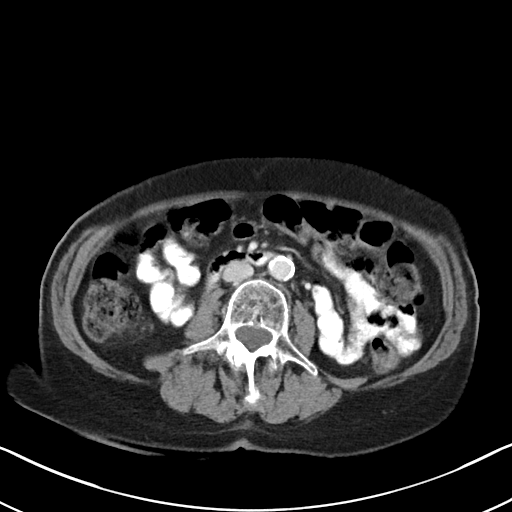
[im 53/81  bone]
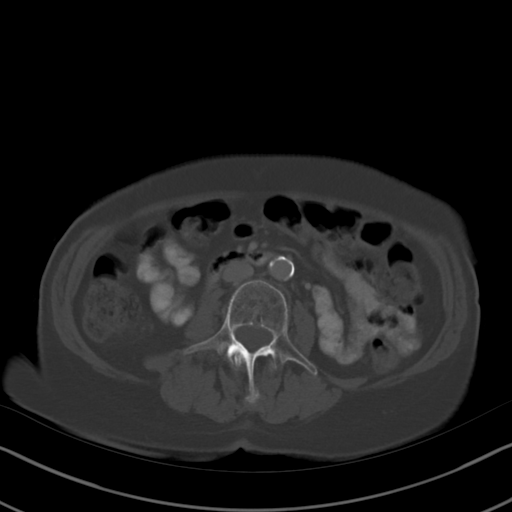
[im 57/81  soft-tissue]
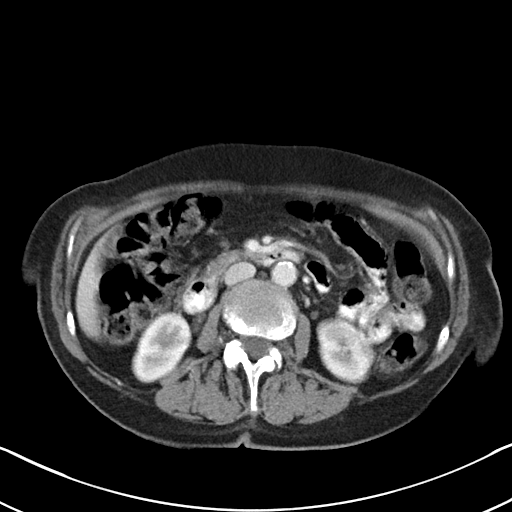
[im 65/81  soft-tissue]
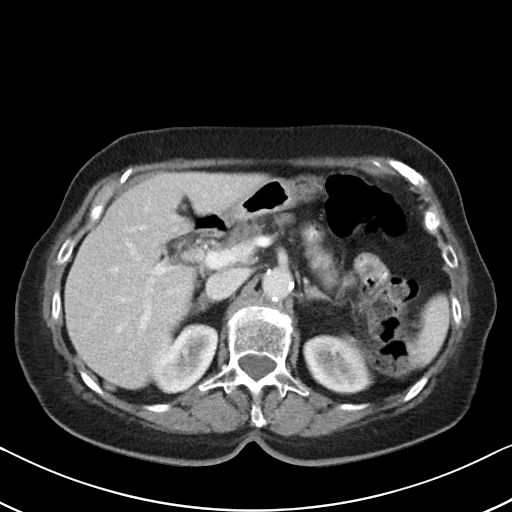
[im 69/81  soft-tissue]
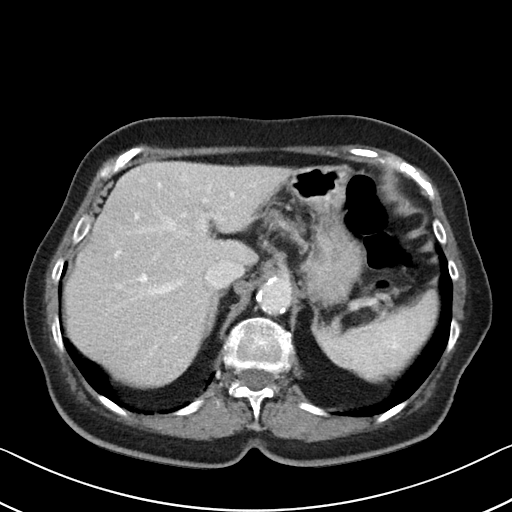
[im 77/81  soft-tissue]
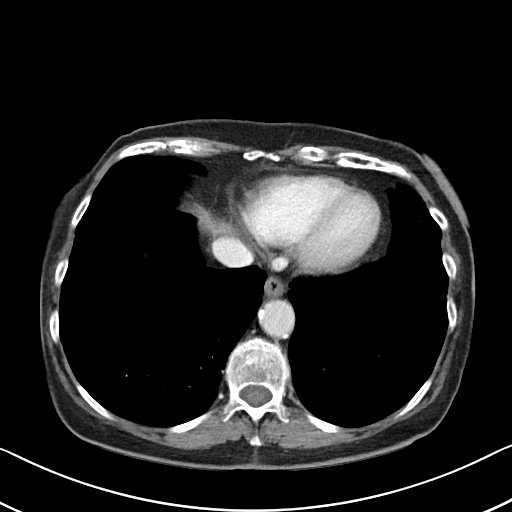

[Series 6: abd/pel w st · coronal · 0.65mm/px · 3 of 65 slices shown]
[im 22/65  soft-tissue]
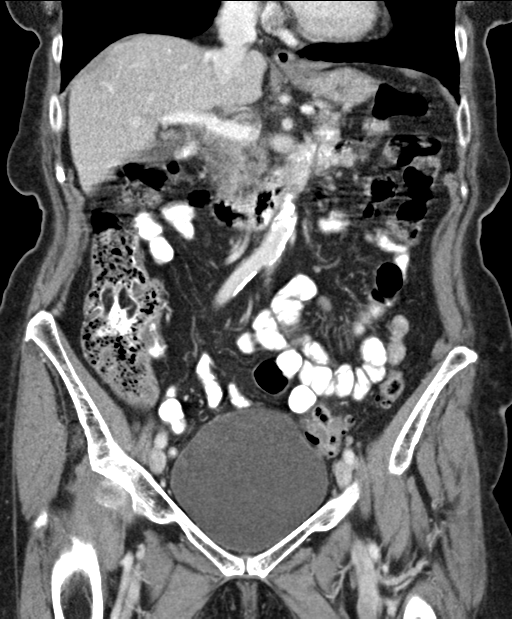
[im 29/65  soft-tissue]
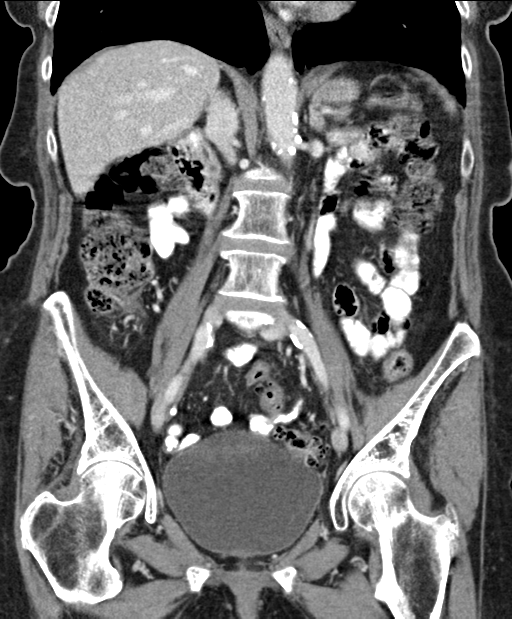
[im 36/65  soft-tissue]
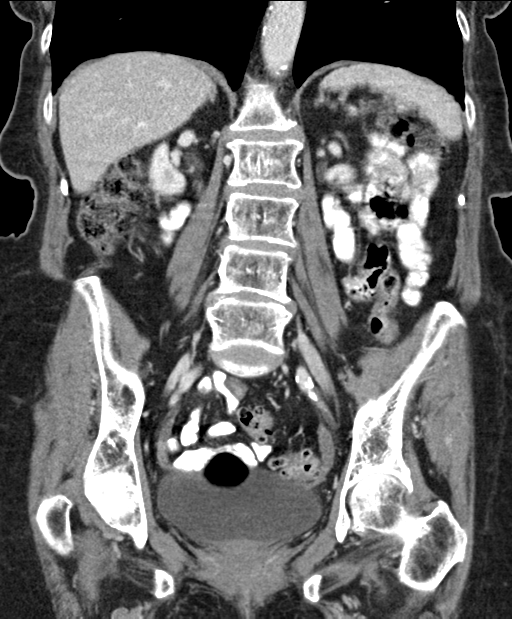

[16 of 46 positions shown; findings below may reference images not displayed]

FINDINGS: Lower chest: Aortic atherosclerosis.

Hepatobiliary: No cystic or solid hepatic lesions. No intra or
extrahepatic biliary ductal dilatation. Gallbladder is normal in
appearance.

Pancreas: No pancreatic mass. No pancreatic ductal dilatation. No
pancreatic or peripancreatic fluid or inflammatory changes.

Spleen: Unremarkable.

Adrenals/Urinary Tract: 12 mm simple cyst in the upper pole
collecting system of the left kidney. Multiple subcentimeter
low-attenuation lesions in both kidneys are too small to
definitively characterize, but are statistically likely to represent
tiny cysts. Bilateral adrenal glands are normal in appearance. No
hydroureteronephrosis. Urinary bladder is normal in appearance.

Stomach/Bowel: Normal appearance of the stomach. No pathologic
dilatation of small bowel or colon. Numerous colonic diverticulae
are noted, without surrounding inflammatory changes to suggest an
acute diverticulitis at this time. The appendix is not confidently
identified and may be surgically absent. Regardless, there are no
inflammatory changes noted adjacent to the cecum to suggest the
presence of an acute appendicitis at this time.

Vascular/Lymphatic: Aortic atherosclerosis, without evidence of
aneurysm or dissection in the abdominal or pelvic vasculature. No
lymphadenopathy noted in the abdomen or pelvis.

Reproductive: Status posthysterectomy.  Ovaries are atrophic.

Other: No significant volume of ascites.  No pneumoperitoneum.

Musculoskeletal: There are no aggressive appearing lytic or blastic
lesions noted in the visualized portions of the skeleton.
IMPRESSION: 1. No acute findings to account for the patient's symptoms.
2. Extensive colonic diverticulosis without evidence to suggest an
acute diverticulitis at this time.
3. Aortic atherosclerosis.
4. Additional incidental findings, as above.

Aortic Atherosclerosis ([id number].G).

## 2018-08-04 ENCOUNTER — Encounter: Payer: Self-pay | Admitting: Internal Medicine

## 2018-08-04 ENCOUNTER — Other Ambulatory Visit: Payer: Self-pay

## 2018-08-04 ENCOUNTER — Ambulatory Visit (INDEPENDENT_AMBULATORY_CARE_PROVIDER_SITE_OTHER): Payer: Medicare Other | Admitting: Internal Medicine

## 2018-08-04 VITALS — BP 146/80 | HR 75 | Ht 64.0 in | Wt 153.2 lb

## 2018-08-04 DIAGNOSIS — I493 Ventricular premature depolarization: Secondary | ICD-10-CM | POA: Diagnosis not present

## 2018-08-04 DIAGNOSIS — R002 Palpitations: Secondary | ICD-10-CM | POA: Diagnosis not present

## 2018-08-04 DIAGNOSIS — I10 Essential (primary) hypertension: Secondary | ICD-10-CM

## 2018-08-04 DIAGNOSIS — I491 Atrial premature depolarization: Secondary | ICD-10-CM

## 2018-08-04 NOTE — Progress Notes (Signed)
HPI [name] [name] returns today for followup of palpitations due to PAC's and PVC's. She is a pleasant but anxious 85 yo woman with a h/o the above. She has been under more stress as her husband has developed dementia. She has been on flecainide for almost 20 years. She wore a cardiac monitor which demonstrated PVC's and PAC's but no sustained arrhythmias. She denies chest pain. She wonders if she can have a glass of wine at night. She admits that she is taking her husband's klonazapam.  Allergies  Allergen Reactions  . Other Anaphylaxis    Reaction to GI Cocktail  . Pantoprazole Other (See Comments)    Upset stomach and weight loss  . Zyrtec [Cetirizine] Palpitations     Current Outpatient Medications  Medication Sig Dispense Refill  . aspirin EC 325 MG tablet Take 325 mg by mouth daily as needed (headache).    Marland Kitchen Cod Liver Oil CAPS Take 1 capsule by mouth daily.    . flecainide (TAMBOCOR) 50 MG tablet Take 2 tablets in the AM and 1 tablet at night (Patient taking differently: Take 50-100 mg by mouth See admin instructions. Take 2 tablets (200 mg) by mouth every morning and 1 tablet (50 mg) at night) 270 tablet 3  . SALINE NASAL SPRAY NA Place 1 spray into both nostrils every other day. [employer] Hammer saline nasal spray     No current facility-administered medications for this visit.      Past Medical History:  Diagnosis Date  . Anemia    iron def  . Anxiety   . Arrhythmia    paroxysmal SVT and symtomatic PAC's  . Diverticulosis    colon  . History of colonic polyps    cecal  . Hypertension   . Osteoporosis 07/21/2011   t score -2.7  . Palpitations 05/24/2017  . Stricture esophagus   . Vitamin D deficiency    2010    ROS:   All systems reviewed and negative except as noted in the HPI.   Past Surgical History:  Procedure Laterality Date  . A&P, enterocele repair     s/p 2003  . ABDOMINAL HYSTERECTOMY    . BREAST REDUCTION SURGERY     s/p 1996      PATIENT  DENIES BREAST REDUCTION   . HEMORRHOID SURGERY    . LEFT HEART CATH AND CORONARY ANGIOGRAPHY N/A 05/25/2017   Procedure: LEFT HEART CATH AND CORONARY ANGIOGRAPHY;  Surgeon: Jettie Booze, MD;  Location: [facility];  Service: Cardiovascular;  Laterality: N/A;     Family History  Problem Relation Age of Onset  . Stroke Father        @ 85  . Hypertension Father   . Hypertension Sister   . Cancer Sister 8       brain  . Breast cancer Other 42       breast  . Breast cancer Daughter 64  . Cancer Daughter 77       breast  . Cancer Other 60       colon/MELANOMA  . Stroke Mother        medication induced  . Kidney disease Brother      Social History   Socioeconomic History  . Marital status: Married    Spouse name: Not on file  . Number of children: Not on file  . Years of education: Not on file  . Highest education level: Not on file  Occupational History  .  Occupation: Retired  Scientific laboratory technician  . Financial resource strain: Not on file  . Food insecurity:    Worry: Not on file    Inability: Not on file  . Transportation needs:    Medical: Not on file    Non-medical: Not on file  Tobacco Use  . Smoking status: Former Smoker    Last attempt to quit: 05/24/1968    Years since quitting: 50.2  . Smokeless tobacco: Never Used  . Tobacco comment: only smoked 7 years  Substance and Sexual Activity  . Alcohol use: No    Alcohol/week: 0.0 standard drinks  . Drug use: No  . Sexual activity: Never    Comment: 1st intercourse 71 yo-1 partner  Lifestyle  . Physical activity:    Days per week: Not on file    Minutes per session: Not on file  . Stress: Not on file  Relationships  . Social connections:    Talks on phone: Not on file    Gets together: Not on file    Attends religious service: Not on file    Active member of club or organization: Not on file    Attends meetings of clubs or organizations: Not on file    Relationship status: Not on file  . Intimate  partner violence:    Fear of current or ex partner: Not on file    Emotionally abused: Not on file    Physically abused: Not on file    Forced sexual activity: Not on file  Other Topics Concern  . Not on file  Social History Narrative  . Not on file     BP (!) 146/80   Pulse 75   Ht 5\' 4"  (1.626 m)   Wt 153 lb 3.2 oz (69.5 kg)   LMP 05/24/1970   SpO2 98%   BMI 26.30 kg/m   Physical Exam:  Well appearing NAD HEENT: Unremarkable Neck:  No JVD, no thyromegally Lymphatics:  No adenopathy Back:  No CVA tenderness Lungs:  Clear with no wheezes HEART:  Regular rate rhythm, no murmurs, no rubs, no clicks Abd:  soft, positive bowel sounds, no organomegally, no rebound, no guarding Ext:  2 plus pulses, no edema, no cyanosis, no clubbing Skin:  No rashes no nodules Neuro:  CN II through XII intact, motor grossly intact   DEVICE  Normal device function.  See PaceArt for details.   Assess/Plan: 1. Palpitations - she is fairly well controlled.  2. PAC's/PVC's - she will continue her flecainide. She is fairly well controlled. 3. Anxiety - She is instructed to find a primary MD to help deal with this problem.  Mikle Bosworth.D.

## 2018-08-04 NOTE — Patient Instructions (Addendum)

## 2018-09-25 DIAGNOSIS — M26629 Arthralgia of temporomandibular joint, unspecified side: Secondary | ICD-10-CM

## 2018-09-25 DIAGNOSIS — M26621 Arthralgia of right temporomandibular joint: Secondary | ICD-10-CM | POA: Diagnosis not present

## 2018-09-25 DIAGNOSIS — H9113 Presbycusis, bilateral: Secondary | ICD-10-CM | POA: Diagnosis not present

## 2018-09-25 HISTORY — DX: Arthralgia of temporomandibular joint, unspecified side: M26.629

## 2018-11-14 ENCOUNTER — Other Ambulatory Visit: Payer: Self-pay

## 2018-11-14 ENCOUNTER — Ambulatory Visit (INDEPENDENT_AMBULATORY_CARE_PROVIDER_SITE_OTHER): Payer: Medicare Other

## 2018-11-14 ENCOUNTER — Encounter (HOSPITAL_COMMUNITY): Payer: Self-pay

## 2018-11-14 ENCOUNTER — Ambulatory Visit (HOSPITAL_COMMUNITY)
Admission: EM | Admit: 2018-11-14 | Discharge: 2018-11-14 | Disposition: A | Discharge status: Home / Self Care | Payer: Medicare Other | Attending: Family Medicine | Admitting: Family Medicine

## 2018-11-14 DIAGNOSIS — M25561 Pain in right knee: Secondary | ICD-10-CM

## 2018-11-14 DIAGNOSIS — R52 Pain, unspecified: Secondary | ICD-10-CM | POA: Diagnosis not present

## 2018-11-14 DIAGNOSIS — M25551 Pain in right hip: Secondary | ICD-10-CM | POA: Diagnosis not present

## 2018-11-14 NOTE — Discharge Instructions (Addendum)
Try to limit your walking on this painful leg You may take aspirin for pain.  Try to take it with food Call your vein doctor tomorrow to set up an appointment Go to ER if worse instead of better  You are constipated.  Make sure you increase your bran, prunes, stool softeners and drink lots of water

## 2018-11-14 NOTE — ED Provider Notes (Signed)
[facility]    CSN: [id number] Arrival date & time: 11/14/18  1452     History   Chief Complaint Chief Complaint  Patient presents with  . vein issue    HPI [name] [name] is a 85 y.o. female.   HPI  [name] [name] is here for right hip and leg pain.  She states it hurts down the back of her thigh to her knee.  It hurts with sitting.  It kept her awake last night.  Is becoming chronic aggressively worse over the last 2 weeks.  She states it feels best when she stands.  No pain with weightbearing.  She is had no fall or injury.  No change in her activity.  No change in appetite, fever or chills.  No change in bowels or digestion, normal urination.  She has never had this kind of pain before.  She states she feels like it is due to her varicose veins.  It hurts in the back of her thigh and knee, and when she looks in the mirror she can see bulging varicose veins present.  She assumes this is the problem.  She does not have a history of any kind of blood clots although she has had a vein removal procedure in the past.  Past Medical History:  Diagnosis Date  . Anemia    iron def  . Anxiety   . Arrhythmia    paroxysmal SVT and symtomatic PAC's  . Diverticulosis    colon  . History of colonic polyps    cecal  . Hypertension   . Osteoporosis 07/21/2011   t score -2.7  . Palpitations 05/24/2017  . Stricture esophagus   . Vitamin D deficiency    2010    Patient Active Problem List   Diagnosis Date Noted  . Chest tightness   . Elevated troponin   . Chest pain 05/23/2017  . Palpitations 05/23/2017  . Rotator cuff arthropathy, right 04/08/2016  . Abdominal discomfort 04/01/2016  . Shoulder pain 03/25/2016  . Arthritis of knee 03/25/2016  . Chest pain on breathing 03/14/2016  . Presbycusis of both ears 01/15/2016  . Tinnitus of right ear 01/15/2016  . Lipoma 02/04/2015  . Skin cancer of arm 09/26/2014  . Chest discomfort 01/20/2014  . Palpitation 09/28/2012  . Allergic  rhinitis 12/25/2011  . TIA (transient ischemic attack) 07/19/2011  . ATRIAL PREMATURE BEATS 03/20/2009  . ANEMIA-IRON DEFICIENCY 04/02/2008  . Anxiety state 04/02/2008  . Essential hypertension 04/02/2008  . GERD 04/02/2008  . OSTEOPOROSIS 04/02/2008    Past Surgical History:  Procedure Laterality Date  . A&P, enterocele repair     s/p 2003  . ABDOMINAL HYSTERECTOMY    . BREAST REDUCTION SURGERY     s/p 1996      PATIENT DENIES BREAST REDUCTION   . HEMORRHOID SURGERY    . LEFT HEART CATH AND CORONARY ANGIOGRAPHY N/A 05/25/2017   Procedure: LEFT HEART CATH AND CORONARY ANGIOGRAPHY;  Surgeon: Jettie Booze, MD;  Location: [facility];  Service: Cardiovascular;  Laterality: N/A;    OB History    Gravida  2   Para  2   Term      Preterm      AB      Living  1     SAB      TAB      Ectopic      Multiple      Live Births  Home Medications    Prior to Admission medications   Medication Sig Start Date End Date Taking? Authorizing Provider  aspirin EC 325 MG tablet Take 325 mg by mouth daily as needed (headache).    [provider]  [facility] Liver Oil CAPS Take 1 capsule by mouth daily.    [provider]  flecainide (TAMBOCOR) 50 MG tablet Take 2 tablets in the AM and 1 tablet at night Patient taking differently: Take 50-100 mg by mouth See admin instructions. Take 2 tablets (200 mg) by mouth every morning and 1 tablet (50 mg) at night 05/15/18   Evans Lance, MD  SALINE NASAL SPRAY NA Place 1 spray into both nostrils every other day. Arm [employer] Hammer saline nasal spray    [provider]    Family History Family History  Problem Relation Age of Onset  . Stroke Father        @ 70  . Hypertension Father   . Hypertension Sister   . Cancer Sister 74       brain  . Breast cancer Other 42       breast  . Breast cancer Daughter 26  . Cancer Daughter 46       breast  . Cancer Other 42       colon/MELANOMA  .  Stroke Mother        medication induced  . Kidney disease Brother     Social History Social History   Tobacco Use  . Smoking status: Former Smoker    Quit date: 05/24/1968    Years since quitting: 50.5  . Smokeless tobacco: Never Used  . Tobacco comment: only smoked 7 years  Substance Use Topics  . Alcohol use: No    Alcohol/week: 0.0 standard drinks  . Drug use: No     Allergies   Other, Pantoprazole, and Zyrtec [cetirizine]   Review of Systems Review of Systems  Constitutional: Negative for activity change, chills and fever.  HENT: Negative for congestion, ear pain and sore throat.   Eyes: Negative for pain and visual disturbance.  Respiratory: Negative for cough and shortness of breath.   Cardiovascular: Negative for chest pain and palpitations.  Gastrointestinal: Negative for abdominal pain and vomiting.  Genitourinary: Negative for dysuria and hematuria.  Musculoskeletal: Negative for arthralgias and back pain.       Pain in the back of right thigh.  No swelling of leg  Skin: Negative for color change and rash.  Neurological: Negative for seizures and syncope.  Psychiatric/Behavioral: Positive for sleep disturbance.  All other systems reviewed and are negative.    Physical Exam Triage Vital Signs ED Triage Vitals  Enc Vitals Group     BP --      Pulse Rate 11/14/18 1528 75     Resp 11/14/18 1528 18     Temp 11/14/18 1528 98 F (36.7 C)     Temp src --      SpO2 11/14/18 1528 98 %     Weight 11/14/18 1525 148 lb (67.1 kg)     Height --      Head Circumference --      Peak Flow --      Pain Score 11/14/18 1525 10     Pain Loc --      Pain Edu? --      Excl. in [place]? --    No data found.  Updated Vital Signs Pulse 75   Temp 98 F (36.7 C)  Resp 18   Wt 67.1 kg   LMP 05/24/1970   SpO2 98%   BMI 25.40 kg/m      Physical Exam Constitutional:      General: She is not in acute distress.    Appearance: Normal appearance. She is well-developed.      Comments: Patient appears appropriate for stated age.  HENT:     Head: Normocephalic and atraumatic.  Eyes:     Conjunctiva/sclera: Conjunctivae normal.     Pupils: Pupils are equal, round, and reactive to light.  Neck:     Musculoskeletal: Normal range of motion.  Cardiovascular:     Rate and Rhythm: Normal rate and regular rhythm.     Heart sounds: Normal heart sounds.     Comments: No ectopic beats or irregularity Pulmonary:     Effort: Pulmonary effort is normal. No respiratory distress.     Breath sounds: Normal breath sounds.  Abdominal:     General: Abdomen is flat. There is no distension.     Palpations: Abdomen is soft.     Comments: No tenderness, no organomegaly  Musculoskeletal: Normal range of motion.     Comments: Patient has soft tissue tenderness in the medial and posterior thigh.  No swelling.  No palpable abnormality.  No cord.  No mass.  There is also tenderness over the right greater trochanter.  Patient has good range of motion in her right hip although complains of pain with internal rotation.  No pain with flexion and extension.  No pain with straight leg raising.  Lymphadenopathy:     Cervical: No cervical adenopathy.  Skin:    General: Skin is warm and dry.     Comments: No edema of lower extremities.  Good pulses.  Numerous superficial  venous varicosities on both legs  Neurological:     Mental Status: She is alert.  Psychiatric:        Mood and Affect: Mood normal.        Behavior: Behavior normal.      UC Treatments / Results  Labs (all labs ordered are listed, but only abnormal results are displayed) Labs Reviewed - No data to display  EKG None  Radiology Dg Hip Unilat With Pelvis 2-3 Views Right  Result Date: 11/14/2018 CLINICAL DATA:  85 year old female with right groin pain for 2 weeks, no known injury. EXAM: DG HIP (WITH OR WITHOUT PELVIS) 2-3V RIGHT COMPARISON:  Prior CT abdomen/pelvis 04/19/2016 FINDINGS: There is no evidence of hip  fracture or dislocation. There is no evidence of arthropathy or other focal bone abnormality. Small amount of retained barium in a circular configuration overlying the anatomic pelvis consistent with barium within sigmoid colonic diverticula. Moderately large colonic stool burden. IMPRESSION: Negative. Electronically Signed   By: Jacqulynn Cadet M.D.   On: 11/14/2018 17:01    Procedures Procedures (including critical care time)  Medications Ordered in UC Medications - No data to display  Initial Impression / Assessment and Plan / UC Course  I have reviewed the triage vital signs and the nursing notes.  Pertinent labs & imaging results that were available during my care of the patient were reviewed by me and considered in my medical decision making (see chart for details).    I examined the patient with Dr. Robyn Haber.  I thought her diagnosis is uncertain.  She did not seem to have any problem with her joints or bones.  Tenderness was in the soft tissues of her posterior thigh.  No swelling or cords to indicate a possible DVT.  Her varicose veins are stable.  Distal neurovascular is intact with good pedal pulses and sensation.  Range of motion of hip and knee are normal.  X-ray of hip is normal.  She does have a large amount of stool, constipation.  This is discussed with her. I explained to her that I was concerned about her leg pain and felt she needed to to the emergency room for occasional imaging.  Patient absolutely refused.  She still is focused on the fact that her veins are causing pain.  She wants to call her vein doctor tomorrow.  She was asked to go to the ER if her pain gets worse instead of better.  Final Clinical Impressions(s) / UC Diagnoses   Final diagnoses:  Pain  Arthralgia of right lower leg     Discharge Instructions     Try to limit your walking on this painful leg You may take aspirin for pain.  Try to take it with food Call your vein doctor tomorrow to  set up an appointment Go to ER if worse instead of better  You are constipated.  Make sure you increase your bran, prunes, stool softeners and drink lots of water    ED Prescriptions    None     Controlled Substance Prescriptions  Controlled Substance Registry consulted? Not Applicable   Raylene Everts, MD 11/14/18 1743

## 2018-11-14 NOTE — ED Triage Notes (Signed)
Pt states she has vein issues in her right leg. Pt states she has pain in her groin area. X 2 weeks.

## 2018-12-21 ENCOUNTER — Other Ambulatory Visit: Payer: Self-pay

## 2019-01-28 ENCOUNTER — Emergency Department (HOSPITAL_COMMUNITY)
Admission: EM | Admit: 2019-01-28 | Discharge: 2019-01-28 | Disposition: A | Discharge status: Home / Self Care | Payer: Medicare Other | Attending: Emergency Medicine | Admitting: Emergency Medicine

## 2019-01-28 ENCOUNTER — Other Ambulatory Visit: Payer: Self-pay

## 2019-01-28 ENCOUNTER — Emergency Department (HOSPITAL_COMMUNITY): Payer: Medicare Other

## 2019-01-28 ENCOUNTER — Encounter (HOSPITAL_COMMUNITY): Payer: Self-pay | Admitting: *Deleted

## 2019-01-28 ENCOUNTER — Ambulatory Visit (HOSPITAL_COMMUNITY)
Admission: EM | Admit: 2019-01-28 | Discharge: 2019-01-28 | Disposition: A | Discharge status: Home / Self Care | Payer: Medicare Other | Source: Home / Self Care

## 2019-01-28 DIAGNOSIS — Z79899 Other long term (current) drug therapy: Secondary | ICD-10-CM | POA: Diagnosis not present

## 2019-01-28 DIAGNOSIS — R0789 Other chest pain: Secondary | ICD-10-CM | POA: Insufficient documentation

## 2019-01-28 DIAGNOSIS — I2510 Atherosclerotic heart disease of native coronary artery without angina pectoris: Secondary | ICD-10-CM | POA: Insufficient documentation

## 2019-01-28 DIAGNOSIS — I10 Essential (primary) hypertension: Secondary | ICD-10-CM | POA: Insufficient documentation

## 2019-01-28 DIAGNOSIS — Z87891 Personal history of nicotine dependence: Secondary | ICD-10-CM | POA: Diagnosis not present

## 2019-01-28 DIAGNOSIS — Z8673 Personal history of transient ischemic attack (TIA), and cerebral infarction without residual deficits: Secondary | ICD-10-CM | POA: Diagnosis not present

## 2019-01-28 DIAGNOSIS — R079 Chest pain, unspecified: Secondary | ICD-10-CM | POA: Diagnosis present

## 2019-01-28 LAB — COMPREHENSIVE METABOLIC PANEL
ALT: 13 U/L (ref 0–44)
AST: 20 U/L (ref 15–41)
Albumin: 4.2 g/dL (ref 3.5–5.0)
Alkaline Phosphatase: 68 U/L (ref 38–126)
Anion gap: 11 (ref 5–15)
BUN: 11 mg/dL (ref 8–23)
CO2: 25 mmol/L (ref 22–32)
Calcium: 9.6 mg/dL (ref 8.9–10.3)
Chloride: 103 mmol/L (ref 98–111)
Creatinine, Ser: 0.62 mg/dL (ref 0.44–1.00)
GFR calc Af Amer: >60 mL/min (ref >60)
GFR calc non Af Amer: >60 mL/min (ref >60)
Glucose, Bld: 94 mg/dL (ref 70–99)
Potassium: 4.1 mmol/L (ref 3.5–5.1)
Sodium: 139 mmol/L (ref 135–145)
Total Bilirubin: 0.6 mg/dL (ref 0.3–1.2)
Total Protein: 7.2 g/dL (ref 6.5–8.1)

## 2019-01-28 LAB — CBC WITH DIFFERENTIAL/PLATELET
Abs Immature Granulocytes: 0.01 10*3/uL (ref 0.00–0.07)
Basophils Absolute: 0 10*3/uL (ref 0.0–0.1)
Basophils Relative: 1 %
Eosinophils Absolute: 0 10*3/uL (ref 0.0–0.5)
Eosinophils Relative: 0 %
HCT: 41 % (ref 36.0–46.0)
Hemoglobin: 13.5 g/dL (ref 12.0–15.0)
Immature Granulocytes: 0 %
Lymphocytes Relative: 26 %
Lymphs Abs: 1.2 10*3/uL (ref 0.7–4.0)
MCH: 31.4 pg (ref 26.0–34.0)
MCHC: 32.9 g/dL (ref 30.0–36.0)
MCV: 95.3 fL (ref 80.0–100.0)
Monocytes Absolute: 0.5 10*3/uL (ref 0.1–1.0)
Monocytes Relative: 10 %
Neutro Abs: 3 10*3/uL (ref 1.7–7.7)
Neutrophils Relative %: 63 %
Platelets: 284 10*3/uL (ref 150–400)
RBC: 4.3 MIL/uL (ref 3.87–5.11)
RDW: 13.3 % (ref 11.5–15.5)
WBC: 4.8 10*3/uL (ref 4.0–10.5)
nRBC: 0 % (ref 0.0–0.2)

## 2019-01-28 LAB — PROTIME-INR
INR: 1.1 (ref 0.8–1.2)
Prothrombin Time: 13.7 seconds (ref 11.4–15.2)

## 2019-01-28 LAB — TROPONIN I (HIGH SENSITIVITY): Troponin I (High Sensitivity): <2 ng/L (ref <18)

## 2019-01-28 LAB — BRAIN NATRIURETIC PEPTIDE: B Natriuretic Peptide: 52.6 pg/mL (ref 0.0–100.0)

## 2019-01-28 LAB — MAGNESIUM: Magnesium: 2.4 mg/dL (ref 1.7–2.4)

## 2019-01-28 MED ORDER — NAPROXEN 500 MG PO TABS: 500.0000 mg | ORAL_TABLET | Freq: Two times a day (BID) | ORAL | 0 refills | Status: AC

## 2019-01-28 MED ORDER — NAPROXEN 500 MG PO TABS: 500.0000 mg | ORAL_TABLET | Freq: Two times a day (BID) | ORAL | 0 refills | Status: DC

## 2019-01-28 MED ORDER — ASPIRIN 81 MG PO CHEW: 324.0000 mg | CHEWABLE_TABLET | Freq: Once | ORAL | Status: DC

## 2019-01-28 NOTE — ED Notes (Signed)
C/O starting with right shoulder and lateral neck stiffness over past week.  Over last 2 days feels a "burning" and "stiffness" in entire chest cavity along with slight nausea.  EKG shown to Rondel Oh, NP.  Pt agrees to have eval in ED per provider recommendation.

## 2019-01-28 NOTE — ED Notes (Signed)
Pt dc'd home with all belongings, a/o x4, drove self home

## 2019-01-28 NOTE — Discharge Instructions (Addendum)
As discussed, your evaluation today has been largely reassuring.  But, it is important that you monitor your condition carefully, and do not hesitate to return to the ED if you develop new, or concerning changes in your condition.  Please take the medicine you have been prescribed for the next 5 days and monitor your condition for improvement in your condition. Medicine should be taken with food. Please stop the medicine if you develop any abdominal discomfort, visible blood in your stool, or nausea with vomiting.  Otherwise, please follow-up with your physician for appropriate ongoing care.

## 2019-01-28 NOTE — ED Provider Notes (Signed)
[facility] EMERGENCY DEPARTMENT Provider Note   CSN: [id number] Arrival date & time: 01/28/19  1315     History   Chief Complaint Chief Complaint  Patient presents with  . Chest Pain    HPI [name] [name] is a 85 y.o. female.     HPI Patient presents from urgent care with concern of pain in her neck, shoulder, back, chest. Patient notes that over the past week or so she has had pain predominantly in her left lateral posterior neck, left upper shoulder, and inconsistently in her thorax. She notes multiple medical issues, states that she felt as though her pain was due to arthritis and age. She denies recent changes in medication, diet, activity. She does have a history of CAD, as well as other multiple medical problems. She takes all medication as directed. No recent fall, trauma. Urgent care notes suggest the patient's chest pain and abnormal EKG warranted transfer.  Past Medical History:  Diagnosis Date  . Anemia    iron def  . Anxiety   . Arrhythmia    paroxysmal SVT and symtomatic PAC's  . Diverticulosis    colon  . History of colonic polyps    cecal  . Hypertension   . 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 Osteoporosis 07/21/2011   t score -2.7  . Palpitations 05/24/2017  . Stricture esophagus   . Vitamin D deficiency    2010    Patient Active Problem List   Diagnosis Date Noted  . Chest tightness   . Elevated troponin   . Chest pain 05/23/2017  . Palpitations 05/23/2017  . Rotator cuff arthropathy, right 04/08/2016  . Abdominal discomfort 04/01/2016  . Shoulder pain 03/25/2016  . Arthritis of knee 03/25/2016  . Chest pain on breathing 03/14/2016  . Presbycusis of both ears 01/15/2016  . Tinnitus of right ear 01/15/2016  . Lipoma 02/04/2015  . Skin cancer of arm 09/26/2014  . Chest discomfort 01/20/2014  . Palpitation 09/28/2012  . Allergic rhinitis 12/25/2011  . TIA  (transient ischemic attack) 07/19/2011  . ATRIAL PREMATURE BEATS 03/20/2009  . ANEMIA-IRON DEFICIENCY 04/02/2008  . Anxiety state 04/02/2008  . Essential hypertension 04/02/2008  . GERD 04/02/2008  . OSTEOPOROSIS 04/02/2008    Past Surgical History:  Procedure Laterality Date  . A&P, enterocele repair     s/p 2003  . ABDOMINAL HYSTERECTOMY    . BREAST REDUCTION SURGERY     s/p 1996      PATIENT DENIES BREAST REDUCTION   . HEMORRHOID SURGERY    . LEFT HEART CATH AND CORONARY ANGIOGRAPHY N/A 05/25/2017   Procedure: LEFT HEART CATH AND CORONARY ANGIOGRAPHY;  Surgeon: Jettie Booze, MD;  Location: [facility];  Service: Cardiovascular;  Laterality: N/A;     OB History    Gravida  2   Para  2   Term      Preterm      AB      Living  1     SAB      TAB      Ectopic      Multiple      Live Births               Home Medications    Prior to Admission medications   Medication Sig Start Date End Date Taking? Authorizing Provider  aspirin EC 325 MG tablet Take 325 mg by  mouth daily as needed (headache).    [provider]  [facility] Liver Oil CAPS Take 1 capsule by mouth daily.    [provider]  flecainide (TAMBOCOR) 50 MG tablet Take 2 tablets in the AM and 1 tablet at night Patient taking differently: Take 50-100 mg by mouth See admin instructions. Take 2 tablets (200 mg) by mouth every morning and 1 tablet (50 mg) at night 05/15/18   Evans Lance, MD  naproxen (NAPROSYN) 500 MG tablet Take 1 tablet (500 mg total) by mouth 2 (two) times daily with a meal for 5 days. 01/28/19 02/02/19  Carmin Muskrat, MD  SALINE NASAL SPRAY NA Place 1 spray into both nostrils every other day. [employer] saline nasal spray    [provider]    Family History Family History  Problem Relation Age of Onset  . Stroke Father        @ 27  . Hypertension Father   . Hypertension Sister   . Cancer Sister 109       brain  . Breast cancer Other  42       breast  . Breast cancer Daughter 14  . Cancer Daughter 24       breast  . Cancer Other 3       colon/MELANOMA  . Stroke Mother        medication induced  . Kidney disease Brother     Social History Social History   Tobacco Use  . Smoking status: Former Smoker    Quit date: 05/24/1968    Years since quitting: 50.7  . Smokeless tobacco: Never Used  . Tobacco comment: only smoked 7 years  Substance Use Topics  . Alcohol use: No    Alcohol/week: 0.0 standard drinks  . Drug use: No     Allergies   Other, Pantoprazole, and Zyrtec [cetirizine]   Review of Systems Review of Systems  Constitutional:       Per HPI, otherwise negative  HENT:       Per HPI, otherwise negative  Respiratory:       Per HPI, otherwise negative  Cardiovascular:       Per HPI, otherwise negative  Gastrointestinal: Negative for vomiting.  Endocrine:       Negative aside from HPI  Genitourinary:       Neg aside from HPI   Musculoskeletal:       Per HPI, otherwise negative  Skin: Negative.   Neurological: Negative for syncope.     Physical Exam Updated Vital Signs BP 140/72   Pulse 71   Resp 13   Ht 5' 5.5" (1.664 m)   Wt 67.6 kg   LMP 05/24/1970   SpO2 96%   BMI 24.42 kg/m   Physical Exam Vitals signs and nursing note reviewed.  Constitutional:      General: She is not in acute distress.    Appearance: She is well-developed.  HENT:     Head: Normocephalic and atraumatic.  Eyes:     Conjunctiva/sclera: Conjunctivae normal.  Neck:     Musculoskeletal: Neck supple. Muscular tenderness present. No spinous process tenderness.  Cardiovascular:     Rate and Rhythm: Normal rate and regular rhythm.  Pulmonary:     Effort: Pulmonary effort is normal. No respiratory distress.     Breath sounds: Normal breath sounds. No stridor.  Abdominal:     General: There is no distension.  Skin:    General: Skin is warm and  dry.  Neurological:     Mental Status: She is alert and  oriented to person, place, and time.     Cranial Nerves: No cranial nerve deficit.      ED Treatments / Results  Labs (all labs ordered are listed, but only abnormal results are displayed) Labs Reviewed  COMPREHENSIVE METABOLIC PANEL  MAGNESIUM  CBC WITH DIFFERENTIAL/PLATELET  PROTIME-INR  BRAIN NATRIURETIC PEPTIDE  CBG MONITORING, ED  TROPONIN I (HIGH SENSITIVITY)  TROPONIN I (HIGH SENSITIVITY)    EKG sr 80, artefact, otherwise unremarkable ECG   Radiology Dg Chest Portable 1 View  Result Date: 01/28/2019 CLINICAL DATA:  Chest burning and stiffness for 2 days. EXAM: PORTABLE CHEST 1 VIEW COMPARISON:  Single-view of the chest 06/03/2018. CT chest 03/13/2016. FINDINGS: The lungs are clear. Heart size is normal. No pneumothorax or pleural fluid. Aortic atherosclerosis noted. No acute or focal bony abnormality. IMPRESSION: No acute disease. Atherosclerosis. Electronically Signed   By: Inge Rise M.D.   On: 01/28/2019 13:50    Procedures Procedures (including critical care time)  Medications Ordered in ED Medications  aspirin chewable tablet 324 mg (has no administration in time range)     Initial Impression / Assessment and Plan / ED Course  I have reviewed the triage vital signs and the nursing notes.  Pertinent labs & imaging results that were available during my care of the patient were reviewed by me and considered in my medical decision making (see chart for details).  Patient in no distress on repeat exam.  We had a lengthy conversation about all findings including reassuring EKG, labs. Given the duration of symptoms, reassuring negative troponin value, EKG, exam, there is little evidence for atypical ACS, no evidence for PE, pneumonia. Some suspicion for musculoskeletal etiology. Patient is not currently taking anything regularly, we will try a short course of anti-inflammatories, taken with food, to follow-up with primary care.    Final Clinical  Impressions(s) / ED Diagnoses   Final diagnoses:  Atypical chest pain    ED Discharge Orders         Ordered    naproxen (NAPROSYN) 500 MG tablet  2 times daily with meals     01/28/19 1526           Carmin Muskrat, MD 01/28/19 1530

## 2019-01-28 NOTE — ED Triage Notes (Signed)
PT drover herself to hospital today for chest pressure. Pt also reported bil. Shoulder pain.

## 2019-01-28 NOTE — ED Triage Notes (Signed)
PT reports she has been taking ASA for chest pressure and shoulder pain

## 2019-02-02 ENCOUNTER — Encounter (HOSPITAL_COMMUNITY): Payer: Self-pay | Admitting: Emergency Medicine

## 2019-02-02 ENCOUNTER — Other Ambulatory Visit: Payer: Self-pay

## 2019-02-02 ENCOUNTER — Ambulatory Visit (HOSPITAL_COMMUNITY)
Admission: EM | Admit: 2019-02-02 | Discharge: 2019-02-02 | Disposition: A | Discharge status: Home / Self Care | Payer: Medicare Other

## 2019-02-02 DIAGNOSIS — Z711 Person with feared health complaint in whom no diagnosis is made: Secondary | ICD-10-CM | POA: Diagnosis not present

## 2019-02-02 DIAGNOSIS — R208 Other disturbances of skin sensation: Secondary | ICD-10-CM

## 2019-02-02 NOTE — ED Triage Notes (Signed)
Pt states shes felt burning in her lungs and esophagus for the last three weeks, states shes felt th esame when she came here last week and was sent to ER. Pt had cardiac rule out. Pt c/o ongoing neck pain and lung burning.

## 2019-02-02 NOTE — ED Provider Notes (Addendum)
[facility]    CSN: [id number] Arrival date & time: 02/02/19  1257      History   Chief Complaint Chief Complaint  Patient presents with  . Neck Pain  . Burning in Chest  . Appointment    110    HPI [name] [name] is a 85 y.o. female.   Patient presents with 3-week history of "burning in my lungs".  She denies chest pain, SOB, cough, regurgitation, fever, rash, or other symptoms.  She was seen in the ED on 01/28/2019 and had a negative cardiac work-up; she was treated at that time with Naprosyn.  She states she does not have a PCP established currently.  She was seen by her eye care doctor yesterday for a "freckle" in her eye.  She has an appointment with the cardiologist on 02/12/2019 she says.  The history is provided by the patient.    Past Medical History:  Diagnosis Date  . Anemia    iron def  . Anxiety   . Arrhythmia    paroxysmal SVT and symtomatic PAC's  . Diverticulosis    colon  . History of colonic polyps    cecal  . Hypertension   . 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 Osteoporosis 07/21/2011   t score -2.7  . Palpitations 05/24/2017  . Stricture esophagus   . Vitamin D deficiency    2010    Patient Active Problem List   Diagnosis Date Noted  . Chest tightness   . Elevated troponin   . Chest pain 05/23/2017  . Palpitations 05/23/2017  . Rotator cuff arthropathy, right 04/08/2016  . Abdominal discomfort 04/01/2016  . Shoulder pain 03/25/2016  . Arthritis of knee 03/25/2016  . Chest pain on breathing 03/14/2016  . Presbycusis of both ears 01/15/2016  . Tinnitus of right ear 01/15/2016  . Lipoma 02/04/2015  . Skin cancer of arm 09/26/2014  . Chest discomfort 01/20/2014  . Palpitation 09/28/2012  . Allergic rhinitis 12/25/2011  . TIA (transient ischemic attack) 07/19/2011  . ATRIAL PREMATURE BEATS 03/20/2009  . ANEMIA-IRON DEFICIENCY 04/02/2008  . Anxiety state 04/02/2008  .  Essential hypertension 04/02/2008  . GERD 04/02/2008  . OSTEOPOROSIS 04/02/2008    Past Surgical History:  Procedure Laterality Date  . A&P, enterocele repair     s/p 2003  . ABDOMINAL HYSTERECTOMY    . BREAST REDUCTION SURGERY     s/p 1996      PATIENT DENIES BREAST REDUCTION   . HEMORRHOID SURGERY    . LEFT HEART CATH AND CORONARY ANGIOGRAPHY N/A 05/25/2017   Procedure: LEFT HEART CATH AND CORONARY ANGIOGRAPHY;  Surgeon: Jettie Booze, MD;  Location: [facility];  Service: Cardiovascular;  Laterality: N/A;    OB History    Gravida  2   Para  2   Term      Preterm      AB      Living  1     SAB      TAB      Ectopic      Multiple      Live Births               Home Medications    Prior to Admission medications   Medication Sig Start Date End Date Taking? Authorizing Provider  aspirin EC 325 MG tablet Take 325 mg by mouth daily as needed (headache).  [provider]  [facility] Liver Oil CAPS Take 1 capsule by mouth daily.    [provider]  flecainide (TAMBOCOR) 50 MG tablet Take 2 tablets in the AM and 1 tablet at night Patient taking differently: Take 50-100 mg by mouth See admin instructions. Take 2 tablets (200 mg) by mouth every morning and 1 tablet (50 mg) at night 05/15/18   Evans Lance, MD  naproxen (NAPROSYN) 500 MG tablet Take 1 tablet (500 mg total) by mouth 2 (two) times daily with a meal for 5 days. 01/28/19 02/02/19  Carmin Muskrat, MD  SALINE NASAL SPRAY NA Place 1 spray into both nostrils every other day. [employer] saline nasal spray    [provider]    Family History Family History  Problem Relation Age of Onset  . Stroke Father        @ 101  . Hypertension Father   . Hypertension Sister   . Cancer Sister 7       brain  . Breast cancer Other 42       breast  . Breast cancer Daughter 35  . Cancer Daughter 34       breast  . Cancer Other 75       colon/MELANOMA  . Stroke Mother         medication induced  . Kidney disease Brother     Social History Social History   Tobacco Use  . Smoking status: Former Smoker    Quit date: 05/24/1968    Years since quitting: 50.7  . Smokeless tobacco: Never Used  . Tobacco comment: only smoked 7 years  Substance Use Topics  . Alcohol use: No    Alcohol/week: 0.0 standard drinks  . Drug use: No     Allergies   Other, Pantoprazole, and Zyrtec [cetirizine]   Review of Systems Review of Systems  Constitutional: Negative for chills and fever.  HENT: Negative for ear pain and sore throat.   Eyes: Negative for pain and visual disturbance.  Respiratory: Negative for cough and shortness of breath.   Cardiovascular: Negative for chest pain and palpitations.  Gastrointestinal: Negative for abdominal pain and vomiting.  Genitourinary: Negative for dysuria and hematuria.  Musculoskeletal: Negative for arthralgias and back pain.  Skin: Negative for color change and rash.  Neurological: Negative for seizures and syncope.  All other systems reviewed and are negative.    Physical Exam Triage Vital Signs ED Triage Vitals  Enc Vitals Group     BP 02/02/19 1322 (!) 154/73     Pulse Rate 02/02/19 1322 79     Resp 02/02/19 1322 18     Temp 02/02/19 1322 98.4 F (36.9 C)     Temp src --      SpO2 02/02/19 1322 97 %     Weight --      Height --      Head Circumference --      Peak Flow --      Pain Score 02/02/19 1324 9     Pain Loc --      Pain Edu? --      Excl. in [place]? --    No data found.  Updated Vital Signs BP (!) 154/73   Pulse 79   Temp 98.4 F (36.9 C)   Resp 18   LMP 05/24/1970   SpO2 97%   Visual Acuity Right Eye Distance:   Left Eye Distance:   Bilateral Distance:    Right  Eye Near:   Left Eye Near:    Bilateral Near:     Physical Exam Vitals signs and nursing note reviewed.  Constitutional:      General: She is not in acute distress.    Appearance: She is well-developed.     Comments:  Well-appearing and NAD.  HENT:     Head: Normocephalic and atraumatic.     Mouth/Throat:     Mouth: Mucous membranes are moist.     Pharynx: Oropharynx is clear.  Eyes:     Conjunctiva/sclera: Conjunctivae normal.  Neck:     Musculoskeletal: Neck supple.  Cardiovascular:     Rate and Rhythm: Normal rate and regular rhythm.     Heart sounds: No murmur.  Pulmonary:     Effort: Pulmonary effort is normal. No respiratory distress.     Breath sounds: Normal breath sounds.  Abdominal:     General: Bowel sounds are normal.     Palpations: Abdomen is soft.     Tenderness: There is no abdominal tenderness. There is no right CVA tenderness, left CVA tenderness, guarding or rebound.  Skin:    General: Skin is warm and dry.     Findings: No rash.  Neurological:     General: No focal deficit present.     Mental Status: She is alert and oriented to person, place, and time.     Gait: Gait normal.      UC Treatments / Results  Labs (all labs ordered are listed, but only abnormal results are displayed) Labs Reviewed - No data to display  EKG   Radiology No results found.  Procedures Procedures (including critical care time)  Medications Ordered in UC Medications - No data to display  Initial Impression / Assessment and Plan / UC Course  I have reviewed the triage vital signs and the nursing notes.  Pertinent labs & imaging results that were available during my care of the patient were reviewed by me and considered in my medical decision making (see chart for details).    Burning sensation in lungs per patient.  Exam unremarkable.  Discussed with patient that her exam is normal today.  Discussed that she should establish a primary care provider and suggestion given to her.  Instructed patient to go to the emergency department if she has chest pain, shortness of breath, cough, fever, abdominal pain, or other symptoms.  Patient agrees with plan of care.     Final Clinical  Impressions(s) / UC Diagnoses   Final diagnoses:  Worried well     Discharge Instructions     Called the primary care provider listed below to schedule an appointment.    Go to the emergency department if you have chest pain, shortness of breath, cough, fever, abdominal pain, or other concerning symptoms.        ED Prescriptions    None     Controlled Substance Prescriptions [place] Controlled Substance Registry consulted? Not Applicable   Sharion Balloon, NP 02/02/19 1401    Sharion Balloon, NP 02/02/19 1410

## 2019-02-02 NOTE — Discharge Instructions (Addendum)
Called the primary care provider listed below to schedule an appointment.    Go to the emergency department if you have chest pain, shortness of breath, cough, fever, abdominal pain, or other concerning symptoms.

## 2019-02-06 DIAGNOSIS — H0102A Squamous blepharitis right eye, upper and lower eyelids: Secondary | ICD-10-CM | POA: Diagnosis not present

## 2019-02-06 DIAGNOSIS — H0102B Squamous blepharitis left eye, upper and lower eyelids: Secondary | ICD-10-CM | POA: Diagnosis not present

## 2019-02-06 DIAGNOSIS — H04123 Dry eye syndrome of bilateral lacrimal glands: Secondary | ICD-10-CM | POA: Diagnosis not present

## 2019-02-06 DIAGNOSIS — H1589 Other disorders of sclera: Secondary | ICD-10-CM | POA: Diagnosis not present

## 2019-02-06 DIAGNOSIS — H2513 Age-related nuclear cataract, bilateral: Secondary | ICD-10-CM | POA: Diagnosis not present

## 2019-02-06 DIAGNOSIS — H5703 Miosis: Secondary | ICD-10-CM | POA: Diagnosis not present

## 2019-02-12 ENCOUNTER — Ambulatory Visit: Payer: Medicare Other | Admitting: Student

## 2019-02-16 ENCOUNTER — Other Ambulatory Visit: Payer: Self-pay

## 2019-02-16 ENCOUNTER — Emergency Department (HOSPITAL_COMMUNITY): Payer: Medicare Other

## 2019-02-16 ENCOUNTER — Encounter (HOSPITAL_COMMUNITY): Payer: Self-pay

## 2019-02-16 ENCOUNTER — Emergency Department (HOSPITAL_COMMUNITY)
Admission: EM | Admit: 2019-02-16 | Discharge: 2019-02-16 | Disposition: A | Discharge status: Home / Self Care | Payer: Medicare Other | Attending: Emergency Medicine | Admitting: Emergency Medicine

## 2019-02-16 DIAGNOSIS — Z87891 Personal history of nicotine dependence: Secondary | ICD-10-CM | POA: Diagnosis not present

## 2019-02-16 DIAGNOSIS — R109 Unspecified abdominal pain: Secondary | ICD-10-CM | POA: Diagnosis present

## 2019-02-16 DIAGNOSIS — R2241 Localized swelling, mass and lump, right lower limb: Secondary | ICD-10-CM | POA: Insufficient documentation

## 2019-02-16 DIAGNOSIS — N898 Other specified noninflammatory disorders of vagina: Secondary | ICD-10-CM | POA: Diagnosis not present

## 2019-02-16 DIAGNOSIS — I10 Essential (primary) hypertension: Secondary | ICD-10-CM | POA: Insufficient documentation

## 2019-02-16 DIAGNOSIS — R1031 Right lower quadrant pain: Secondary | ICD-10-CM | POA: Diagnosis not present

## 2019-02-16 LAB — CBC
HCT: 38.6 % (ref 36.0–46.0)
Hemoglobin: 12.9 g/dL (ref 12.0–15.0)
MCH: 31.9 pg (ref 26.0–34.0)
MCHC: 33.4 g/dL (ref 30.0–36.0)
MCV: 95.5 fL (ref 80.0–100.0)
Platelets: 287 10*3/uL (ref 150–400)
RBC: 4.04 MIL/uL (ref 3.87–5.11)
RDW: 13.2 % (ref 11.5–15.5)
WBC: 3.9 10*3/uL — ABNORMAL LOW (ref 4.0–10.5)
nRBC: 0 % (ref 0.0–0.2)

## 2019-02-16 LAB — URINALYSIS, ROUTINE W REFLEX MICROSCOPIC
Bilirubin Urine: NEGATIVE
Glucose, UA: NEGATIVE mg/dL
Hgb urine dipstick: NEGATIVE
Ketones, ur: NEGATIVE mg/dL
Nitrite: NEGATIVE
Protein, ur: NEGATIVE mg/dL
Specific Gravity, Urine: 1.004 — ABNORMAL LOW (ref 1.005–1.030)
pH: 7 (ref 5.0–8.0)

## 2019-02-16 LAB — COMPREHENSIVE METABOLIC PANEL
ALT: 11 U/L (ref 0–44)
AST: 20 U/L (ref 15–41)
Albumin: 4.1 g/dL (ref 3.5–5.0)
Alkaline Phosphatase: 71 U/L (ref 38–126)
Anion gap: 10 (ref 5–15)
BUN: 9 mg/dL (ref 8–23)
CO2: 26 mmol/L (ref 22–32)
Calcium: 9.5 mg/dL (ref 8.9–10.3)
Chloride: 104 mmol/L (ref 98–111)
Creatinine, Ser: 0.72 mg/dL (ref 0.44–1.00)
GFR calc Af Amer: >60 mL/min (ref >60)
GFR calc non Af Amer: >60 mL/min (ref >60)
Glucose, Bld: 102 mg/dL — ABNORMAL HIGH (ref 70–99)
Potassium: 4.2 mmol/L (ref 3.5–5.1)
Sodium: 140 mmol/L (ref 135–145)
Total Bilirubin: 0.3 mg/dL (ref 0.3–1.2)
Total Protein: 7.2 g/dL (ref 6.5–8.1)

## 2019-02-16 LAB — LIPASE, BLOOD: Lipase: 25 U/L (ref 11–51)

## 2019-02-16 MED ORDER — PREMARIN 0.625 MG/GM VA CREA: 1.0000 | TOPICAL_CREAM | Freq: Every day | VAGINAL | 0 refills | Status: DC

## 2019-02-16 MED ORDER — SODIUM CHLORIDE 0.9% FLUSH: 3.0000 mL | Freq: Once | INTRAVENOUS | Status: DC

## 2019-02-16 MED ORDER — CEPHALEXIN 500 MG PO CAPS: 500.0000 mg | ORAL_CAPSULE | Freq: Two times a day (BID) | ORAL | 0 refills | Status: AC

## 2019-02-16 MED ORDER — IOHEXOL 300 MG/ML  SOLN
100.0000 mL | Freq: Once | INTRAMUSCULAR | Status: AC | PRN
  Administered 2019-02-16: 100 mL via INTRAVENOUS

## 2019-02-16 NOTE — ED Notes (Signed)
Patient verbalizes understanding of discharge instructions. Opportunity for questioning and answers were provided. Armband removed by staff, pt discharged from ED ambulatory.   

## 2019-02-16 NOTE — ED Provider Notes (Signed)
[facility] Emergency Department Provider Note MRN:  [id number]  Arrival date & time: 02/16/19     Chief Complaint   Abdominal Pain and Vaginal Pain   History of Present Illness   [name] [name] is a 85 y.o. year-old female with a history of diverticulitis, hypertension presenting to the ED with chief complaint of abdominal pain and vaginal pain.  Yesterday evening patient reported sudden onset right lower quadrant abdominal pain.  Currently mild pain, feels like a soreness internally.  Also endorsing vaginal burning and discomfort, rectal burning and discomfort.  She attributes some of this discomfort to her surgery for a rectocele back in 2003.  She thinks something has gone wrong with that surgery.  She denies fever, no chest pain or shortness of breath, no nausea or vomiting, no diarrhea, no blood in stool, no black stool.  Is endorsing some burning with urination.  Review of Systems  A complete 10 system review of systems was obtained and all systems are negative except as noted in the HPI and PMH.   Patient's Health History    Past Medical History:  Diagnosis Date  . Anemia    iron def  . Anxiety   . Arrhythmia    paroxysmal SVT and symtomatic PAC's  . Diverticulosis    colon  . History of colonic polyps    cecal  . Hypertension   . 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 Osteoporosis 07/21/2011   t score -2.7  . Palpitations 05/24/2017  . Stricture esophagus   . Vitamin D deficiency    2010    Past Surgical History:  Procedure Laterality Date  . A&P, enterocele repair     s/p 2003  . ABDOMINAL HYSTERECTOMY    . BREAST REDUCTION SURGERY     s/p 1996      PATIENT DENIES BREAST REDUCTION   . HEMORRHOID SURGERY    . LEFT HEART CATH AND CORONARY ANGIOGRAPHY N/A 05/25/2017   Procedure: LEFT HEART CATH AND CORONARY ANGIOGRAPHY;  Surgeon: Jettie Booze, MD;  Location: [facility];   Service: Cardiovascular;  Laterality: N/A;    Family History  Problem Relation Age of Onset  . Stroke Father        @ 83  . Hypertension Father   . Hypertension Sister   . Cancer Sister 21       brain  . Breast cancer Other 42       breast  . Breast cancer Daughter 60  . Cancer Daughter 37       breast  . Cancer Other 49       colon/MELANOMA  . Stroke Mother        medication induced  . Kidney disease Brother     Social History   Socioeconomic History  . Marital status: Married    Spouse name: Not on file  . Number of children: Not on file  . Years of education: Not on file  . Highest education level: Not on file  Occupational History  . Occupation: Retired  Scientific laboratory technician  . Financial resource strain: Not on file  . Food insecurity    Worry: Not on file    Inability: Not on file  . Transportation needs    Medical: Not on file    Non-medical: Not on file  Tobacco Use  . Smoking status: Former Smoker    Quit date: 05/24/1968  Years since quitting: 50.7  . Smokeless tobacco: Never Used  . Tobacco comment: only smoked 7 years  Substance and Sexual Activity  . Alcohol use: No    Alcohol/week: 0.0 standard drinks  . Drug use: No  . Sexual activity: Never    Comment: 1st intercourse 18 yo-1 partner  Lifestyle  . Physical activity    Days per week: Not on file    Minutes per session: Not on file  . Stress: Not on file  Relationships  . Social Herbalist on phone: Not on file    Gets together: Not on file    Attends religious service: Not on file    Active member of club or organization: Not on file    Attends meetings of clubs or organizations: Not on file    Relationship status: Not on file  . Intimate partner violence    Fear of current or ex partner: Not on file    Emotionally abused: Not on file    Physically abused: Not on file    Forced sexual activity: Not on file  Other Topics Concern  . Not on file  Social History Narrative  . Not on  file     Physical Exam  Vital Signs and Nursing Notes reviewed Vitals:   02/16/19 2018 02/16/19 2019  BP: (!) 161/91   Pulse:  89  Resp:    Temp:    SpO2:  96%    CONSTITUTIONAL: Well-appearing, NAD NEURO:  Alert and oriented x 3, no focal deficits EYES:  eyes equal and reactive ENT/NECK:  no LAD, no JVD CARDIO: Regular rate, well-perfused, normal S1 and S2 PULM:  CTAB no wheezing or rhonchi GI/GU:  normal bowel sounds, non-distended, non-tender; multiple small hemostatic external hemorrhoids surrounding the rectum; mild erythema to the labia majora bilaterally MSK/SPINE:  No gross deformities, no edema SKIN:  no rash, atraumatic PSYCH:  Appropriate speech and behavior  Diagnostic and Interventional Summary    Labs Reviewed  COMPREHENSIVE METABOLIC PANEL - Abnormal; Notable for the following components:      Result Value   Glucose, Bld 102 (*)    All other components within normal limits  CBC - Abnormal; Notable for the following components:   WBC 3.9 (*)    All other components within normal limits  URINALYSIS, ROUTINE W REFLEX MICROSCOPIC - Abnormal; Notable for the following components:   Color, Urine STRAW (*)    Specific Gravity, Urine 1.004 (*)    Leukocytes,Ua LARGE (*)    Bacteria, UA RARE (*)    Non Squamous Epithelial 0-5 (*)    All other components within normal limits  LIPASE, BLOOD    CT ABDOMEN PELVIS W CONTRAST  Final Result      Medications  sodium chloride flush (NS) 0.9 % injection 3 mL (has no administration in time range)  iohexol (OMNIPAQUE) 300 MG/ML solution 100 mL (100 mLs Intravenous Contrast Given 02/16/19 1807)     Procedures Critical Care  ED Course and Medical Decision Making  I have reviewed the triage vital signs and the nursing notes.  Pertinent labs & imaging results that were available during my care of the patient were reviewed by me and considered in my medical decision making (see below for details).  CT to exclude  appendicitis, also considering urinary tract infection, atrophic vaginitis.  Exam is otherwise reassuring without signs of cellulitis or infection.  Anticipating discharge with close PCP follow-up, which she has in place on  Monday.  Work-up largely unrevealing, reassuring labs and CT scan.  Urinalysis with some evidence to suggest infection, given her dysuria will treat with Keflex.  We will also provide estrogen cream for the likely atrophic vaginitis.  Has close follow-up on Monday.  Barth Kirks. Sedonia Small, MD [place] [email or web address]  Final Clinical Impressions(s) / ED Diagnoses     ICD-10-CM   1. Vaginal irritation  N89.8     ED Discharge Orders         Ordered    cephALEXin (KEFLEX) 500 MG capsule  2 times daily     02/16/19 2035    conjugated estrogens (PREMARIN) vaginal cream  Daily     02/16/19 2035          Discharge Instructions Discussed with and Provided to Patient:   Discharge Instructions     You were evaluated in the Emergency Department and after careful evaluation, we did not find any emergent condition requiring admission or further testing in the hospital.  Your exam/testing today was overall reassuring.  Your a blood testing and CAT scan were reassuring.  Your urinalysis did show some signs of infection.  We also suspect some degree of atrophic vaginitis.  As discussed, please use the medications provided as directed and follow-up with your primary care doctor.  Please return to the Emergency Department if you experience any worsening of your condition.  We encourage you to follow up with a primary care provider.  Thank you for allowing [place] to be a part of your care.        Maudie Flakes, MD 02/16/19 2036

## 2019-02-16 NOTE — ED Triage Notes (Signed)
Pt reports right sided abd pain that started last night and as well as rectal discomfort. Right leg swelling, vaginal swelling. Denies n/v. No vaginal or rectal bleeding.

## 2019-02-16 NOTE — Discharge Instructions (Addendum)
You were evaluated in the Emergency Department and after careful evaluation, we did not find any emergent condition requiring admission or further testing in the hospital.  Your exam/testing today was overall reassuring.  Your a blood testing and CAT scan were reassuring.  Your urinalysis did show some signs of infection.  We also suspect some degree of atrophic vaginitis.  As discussed, please use the medications provided as directed and follow-up with your primary care doctor.  Please return to the Emergency Department if you experience any worsening of your condition.  We encourage you to follow up with a primary care provider.  Thank you for allowing [place] to be a part of your care.

## 2019-02-19 ENCOUNTER — Encounter: Payer: Self-pay | Admitting: Gynecology

## 2019-02-19 ENCOUNTER — Ambulatory Visit (INDEPENDENT_AMBULATORY_CARE_PROVIDER_SITE_OTHER): Payer: Medicare Other | Admitting: Gynecology

## 2019-02-19 ENCOUNTER — Other Ambulatory Visit: Payer: Self-pay

## 2019-02-19 VITALS — BP 124/74 | Ht 65.0 in | Wt 148.0 lb

## 2019-02-19 DIAGNOSIS — R1031 Right lower quadrant pain: Secondary | ICD-10-CM

## 2019-02-19 NOTE — Progress Notes (Signed)
    [name] [name] 07/01/1929 [id number]        85 y.o.  G2P2 presents in follow-up from emergency room visit several days ago.  Started experiencing right lower quadrant pain.  Emergency room evaluation was negative to include normal CBC, CMP and a negative abdominopelvic CT scan.  She was started on antibiotics for UTI.  Was also given a prescription for vaginal estrogen cream which she has not started.  Status post hysterectomy in the past with A&P repair by Dr. Ree Edman  Past medical history,surgical history, problem list, medications, allergies, family history and social history were all reviewed and documented in the EPIC chart.  Directed ROS with pertinent positives and negatives documented in the history of present illness/assessment and plan.  Exam: [name] assistant Vitals:   02/19/19 0950  BP: 124/74  Weight: 148 lb (67.1 kg)  Height: 5\' 5"  (1.651 m)   General appearance:  Normal Abdomen soft nontender without masses guarding rebound Pelvic external BUS vagina with atrophic changes.  Bimanual without masses or tenderness.  Rectal exam is normal.  Assessment/Plan:  85 y.o. G2P2 with right lower quadrant discomfort.  Currently being treated for UTI.  Negative CT scan.  Status post hysterectomy in the past discussed differential to include GI is the most likely cause of her discomfort.  At this point she will complete her course of antibiotics and we will see how she does.  If her pain continues she will follow-up with her primary provider for further evaluation.   Anastasio Auerbach MD, 10:11 AM 02/19/2019

## 2019-02-19 NOTE — Patient Instructions (Signed)
Complete the course of antibiotics as prescribed.  Follow-up with your primary physician if your discomfort continues.

## 2019-02-27 ENCOUNTER — Telehealth: Payer: Self-pay | Admitting: Internal Medicine

## 2019-02-27 NOTE — Telephone Encounter (Signed)
I am not taking new patients right now sorry

## 2019-02-27 NOTE — Telephone Encounter (Signed)
Copied from [place] [id number]. Topic: General - Inquiry >> Feb 27, 2019  8:59 AM Mathis Bud wrote: Reason for CRM: Patient was last seen by Sharlet Salina in 2017.  Patient would like to set up an appt with [facility]. Patient states she is having female problems but did not want disclose anymore.   Call back [phone]

## 2019-02-28 ENCOUNTER — Ambulatory Visit (HOSPITAL_COMMUNITY)
Admission: EM | Admit: 2019-02-28 | Discharge: 2019-02-28 | Disposition: A | Discharge status: Home / Self Care | Payer: Medicare Other | Attending: Emergency Medicine | Admitting: Emergency Medicine

## 2019-02-28 ENCOUNTER — Encounter (HOSPITAL_COMMUNITY): Payer: Self-pay

## 2019-02-28 ENCOUNTER — Other Ambulatory Visit: Payer: Self-pay

## 2019-02-28 DIAGNOSIS — R319 Hematuria, unspecified: Secondary | ICD-10-CM | POA: Insufficient documentation

## 2019-02-28 DIAGNOSIS — N390 Urinary tract infection, site not specified: Secondary | ICD-10-CM | POA: Diagnosis not present

## 2019-02-28 LAB — POCT URINALYSIS DIP (DEVICE)
Bilirubin Urine: NEGATIVE
Glucose, UA: NEGATIVE mg/dL
Ketones, ur: NEGATIVE mg/dL
Nitrite: NEGATIVE
Protein, ur: 100 mg/dL — AB
Specific Gravity, Urine: 1.01 (ref 1.005–1.030)
Urobilinogen, UA: 0.2 mg/dL (ref 0.0–1.0)
pH: 5.5 (ref 5.0–8.0)

## 2019-02-28 MED ORDER — SULFAMETHOXAZOLE-TRIMETHOPRIM 800-160 MG PO TABS: 1.0000 | ORAL_TABLET | Freq: Two times a day (BID) | ORAL | 0 refills | Status: AC

## 2019-02-28 NOTE — Discharge Instructions (Signed)
Your urine showed signs of infection.  A urine culture is pending.  Take the antibiotic as prescribed.    Follow-up with your primary care provider or the provider suggested below for a recheck in 1 week.

## 2019-02-28 NOTE — ED Triage Notes (Signed)
Pt presents with burning with urination; pt states she was on antibiotics but it does seem to be helping so her PCP recommended her have her urine checked again.

## 2019-02-28 NOTE — ED Provider Notes (Signed)
[facility]    CSN: [id number] Arrival date & time: 02/28/19  1049      History   Chief Complaint Chief Complaint  Patient presents with  . Urinary Tract Infection    HPI [name] [name] is a 85 y.o. female.   Patient presents with dysuria x2 weeks.  She was seen in the emergency department on 02/16/2019 and treated with Keflex x7 days and Premarin cream.  Patient reports she took the entire course and improved but then her symptoms returned yesterday.  She denies fever, chills, or back pain.  The history is provided by the patient.    Past Medical History:  Diagnosis Date  . Anemia    iron def  . Anxiety   . Arrhythmia    paroxysmal SVT and symtomatic PAC's  . Diverticulosis    colon  . History of colonic polyps    cecal  . Hypertension   . 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 Osteoporosis 07/21/2011   t score -2.7  . Palpitations 05/24/2017  . Stricture esophagus   . Vitamin D deficiency    2010    Patient Active Problem List   Diagnosis Date Noted  . Chest tightness   . Elevated troponin   . Chest pain 05/23/2017  . Palpitations 05/23/2017  . Rotator cuff arthropathy, right 04/08/2016  . Abdominal discomfort 04/01/2016  . Shoulder pain 03/25/2016  . Arthritis of knee 03/25/2016  . Chest pain on breathing 03/14/2016  . Presbycusis of both ears 01/15/2016  . Tinnitus of right ear 01/15/2016  . Lipoma 02/04/2015  . Skin cancer of arm 09/26/2014  . Chest discomfort 01/20/2014  . Palpitation 09/28/2012  . Allergic rhinitis 12/25/2011  . TIA (transient ischemic attack) 07/19/2011  . ATRIAL PREMATURE BEATS 03/20/2009  . ANEMIA-IRON DEFICIENCY 04/02/2008  . Anxiety state 04/02/2008  . Essential hypertension 04/02/2008  . GERD 04/02/2008  . OSTEOPOROSIS 04/02/2008    Past Surgical History:  Procedure Laterality Date  . A&P, enterocele repair     s/p 2003  . ABDOMINAL HYSTERECTOMY    .  BREAST REDUCTION SURGERY     s/p 1996      PATIENT DENIES BREAST REDUCTION   . HEMORRHOID SURGERY    . LEFT HEART CATH AND CORONARY ANGIOGRAPHY N/A 05/25/2017   Procedure: LEFT HEART CATH AND CORONARY ANGIOGRAPHY;  Surgeon: Jettie Booze, MD;  Location: [facility];  Service: Cardiovascular;  Laterality: N/A;    OB History    Gravida  2   Para  2   Term      Preterm      AB      Living  1     SAB      TAB      Ectopic      Multiple      Live Births               Home Medications    Prior to Admission medications   Medication Sig Start Date End Date Taking? Authorizing Provider  aspirin EC 325 MG tablet Take 325 mg by mouth daily as needed (headache).    [provider]  [facility] Liver Oil CAPS Take 1 capsule by mouth daily.    [provider]  conjugated estrogens (PREMARIN) vaginal cream Place 1 Applicatorful vaginally daily. 02/16/19   Maudie Flakes, MD  flecainide (TAMBOCOR) 50 MG tablet Take 2  tablets in the AM and 1 tablet at night Patient taking differently: Take 50-100 mg by mouth See admin instructions. Take 2 tablets (200 mg) by mouth every morning and 1 tablet (50 mg) at night 05/15/18   Evans Lance, MD  SALINE NASAL SPRAY NA Place 1 spray into both nostrils every other day. [employer] saline nasal spray    [provider]  sulfamethoxazole-trimethoprim (BACTRIM DS) 800-160 MG tablet Take 1 tablet by mouth 2 (two) times daily for 7 days. 02/28/19 03/07/19  Sharion Balloon, NP    Family History Family History  Problem Relation Age of Onset  . Stroke Father        @ 46  . Hypertension Father   . Hypertension Sister   . Cancer Sister 50       brain  . Breast cancer Other 42       breast  . Breast cancer Daughter 28  . Cancer Daughter 63       breast  . Cancer Other 42       colon/MELANOMA  . Stroke Mother        medication induced  . Kidney disease Brother     Social History Social History   Tobacco  Use  . Smoking status: Former Smoker    Quit date: 05/24/1968    Years since quitting: 50.8  . Smokeless tobacco: Never Used  . Tobacco comment: only smoked 7 years  Substance Use Topics  . Alcohol use: No    Alcohol/week: 0.0 standard drinks  . Drug use: No     Allergies   Other, Pantoprazole, and Zyrtec [cetirizine]   Review of Systems Review of Systems  Constitutional: Negative for chills and fever.  HENT: Negative for ear pain and sore throat.   Eyes: Negative for pain and visual disturbance.  Respiratory: Negative for cough and shortness of breath.   Cardiovascular: Negative for chest pain and palpitations.  Gastrointestinal: Negative for abdominal pain and vomiting.  Genitourinary: Positive for dysuria. Negative for flank pain and hematuria.  Musculoskeletal: Negative for arthralgias and back pain.  Skin: Negative for color change and rash.  Neurological: Negative for seizures and syncope.  All other systems reviewed and are negative.    Physical Exam Triage Vital Signs ED Triage Vitals [02/28/19 1114]  Enc Vitals Group     BP (!) 132/57     Pulse Rate 80     Resp 16     Temp 98 F (36.7 C)     Temp Source Oral     SpO2 98 %     Weight      Height      Head Circumference      Peak Flow      Pain Score      Pain Loc      Pain Edu?      Excl. in [place]?    No data found.  Updated Vital Signs BP (!) 132/57 (BP Location: Right Arm)   Pulse 80   Temp 98 F (36.7 C) (Oral)   Resp 16   LMP 05/24/1970   SpO2 98%   Visual Acuity Right Eye Distance:   Left Eye Distance:   Bilateral Distance:    Right Eye Near:   Left Eye Near:    Bilateral Near:     Physical Exam Vitals signs and nursing note reviewed.  Constitutional:      General: She is not in acute distress.  Appearance: She is well-developed.  HENT:     Head: Normocephalic and atraumatic.     Mouth/Throat:     Mouth: Mucous membranes are moist.  Eyes:     Conjunctiva/sclera: Conjunctivae  normal.  Neck:     Musculoskeletal: Neck supple.  Cardiovascular:     Rate and Rhythm: Normal rate and regular rhythm.     Heart sounds: No murmur.  Pulmonary:     Effort: Pulmonary effort is normal. No respiratory distress.     Breath sounds: Normal breath sounds.  Abdominal:     General: Bowel sounds are normal.     Palpations: Abdomen is soft.     Tenderness: There is no abdominal tenderness. There is no right CVA tenderness, left CVA tenderness, guarding or rebound.  Skin:    General: Skin is warm and dry.  Neurological:     Mental Status: She is alert.      UC Treatments / Results  Labs (all labs ordered are listed, but only abnormal results are displayed) Labs Reviewed  POCT URINALYSIS DIP (DEVICE) - Abnormal; Notable for the following components:      Result Value   Hgb urine dipstick MODERATE (*)    Protein, ur 100 (*)    Leukocytes,Ua LARGE (*)    All other components within normal limits  URINE CULTURE    EKG   Radiology No results found.  Procedures Procedures (including critical care time)  Medications Ordered in UC Medications - No data to display  Initial Impression / Assessment and Plan / UC Course  I have reviewed the triage vital signs and the nursing notes.  Pertinent labs & imaging results that were available during my care of the patient were reviewed by me and considered in my medical decision making (see chart for details).    Urinary tract infection.  Urine dip shows large LE, 100 protein, moderate blood.  Urine culture pending.  Treating with Bactrim.  Instructed patient to follow-up with her PCP or suggested PCP in 1 week for a recheck.  Patient agrees to plan of care.     Final Clinical Impressions(s) / UC Diagnoses   Final diagnoses:  Urinary tract infection with hematuria, site unspecified     Discharge Instructions     Your urine showed signs of infection.  A urine culture is pending.  Take the antibiotic as prescribed.     Follow-up with your primary care provider or the provider suggested below for a recheck in 1 week.        ED Prescriptions    Medication Sig Dispense Auth. Provider   sulfamethoxazole-trimethoprim (BACTRIM DS) 800-160 MG tablet Take 1 tablet by mouth 2 (two) times daily for 7 days. 14 tablet Sharion Balloon, NP     PDMP not reviewed this encounter.   Sharion Balloon, NP 02/28/19 1158

## 2019-03-01 ENCOUNTER — Encounter: Payer: Self-pay | Admitting: Gynecology

## 2019-03-02 ENCOUNTER — Telehealth (HOSPITAL_COMMUNITY): Payer: Self-pay | Admitting: Emergency Medicine

## 2019-03-02 MED ORDER — CIPROFLOXACIN HCL 500 MG PO TABS: 500.0000 mg | ORAL_TABLET | Freq: Two times a day (BID) | ORAL | 0 refills | Status: DC

## 2019-03-02 NOTE — Telephone Encounter (Signed)
LVM informing patient of message below.

## 2019-03-02 NOTE — Telephone Encounter (Signed)
Pt c/o ongoing symptoms, Per hallie, change to cipro 500 bid x1 week. Patient contacted and made aware of    results, all questions answered

## 2019-03-03 LAB — URINE CULTURE: Culture: 30000 — AB

## 2019-03-07 ENCOUNTER — Other Ambulatory Visit: Payer: Self-pay

## 2019-03-07 ENCOUNTER — Ambulatory Visit (INDEPENDENT_AMBULATORY_CARE_PROVIDER_SITE_OTHER): Payer: Medicare Other | Admitting: Internal Medicine

## 2019-03-07 VITALS — BP 142/70 | HR 75 | Temp 97.8°F | Ht 65.0 in | Wt 149.5 lb

## 2019-03-07 DIAGNOSIS — F419 Anxiety disorder, unspecified: Secondary | ICD-10-CM | POA: Diagnosis not present

## 2019-03-07 DIAGNOSIS — R300 Dysuria: Secondary | ICD-10-CM | POA: Insufficient documentation

## 2019-03-07 DIAGNOSIS — N3001 Acute cystitis with hematuria: Secondary | ICD-10-CM | POA: Diagnosis not present

## 2019-03-07 DIAGNOSIS — J3089 Other allergic rhinitis: Secondary | ICD-10-CM | POA: Diagnosis not present

## 2019-03-07 HISTORY — DX: Dysuria: R30.0

## 2019-03-07 MED ORDER — FLUTICASONE PROPIONATE 50 MCG/ACT NA SUSP: 2.0000 | Freq: Every day | NASAL | 2 refills | Status: DC

## 2019-03-07 MED ORDER — CIPROFLOXACIN HCL 250 MG PO TABS: 250.0000 mg | ORAL_TABLET | Freq: Two times a day (BID) | ORAL | 0 refills | Status: DC

## 2019-03-07 NOTE — Progress Notes (Signed)
CC: Acute Cystitis, Anxiety, Chronic Rhinitis, Establish care  HPI:   [name] is a 85 y.o. F with PMHx listed below presenting for Acute Cystitis, Anxiety, Chronic Rhinitis, Establish care. Please see the A&P for the status of the patient's chronic medical problems.  Past Medical History:  Diagnosis Date  . Anemia    iron def  . Anxiety   . Arrhythmia    paroxysmal SVT and symtomatic PAC's  . Diverticulosis    colon  . History of colonic polyps    cecal  . Hypertension   . 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 Osteoporosis 07/21/2011   t score -2.7  . Palpitations 05/24/2017  . Stricture esophagus   . Vitamin D deficiency    2010   Past Surgical History:  Procedure Laterality Date  . A&P, enterocele repair     s/p 2003  . ABDOMINAL HYSTERECTOMY    . BREAST REDUCTION SURGERY     s/p 1996      PATIENT DENIES BREAST REDUCTION   . HEMORRHOID SURGERY    . LEFT HEART CATH AND CORONARY ANGIOGRAPHY N/A 05/25/2017   Procedure: LEFT HEART CATH AND CORONARY ANGIOGRAPHY;  Surgeon: Jettie Booze, MD;  Location: [facility];  Service: Cardiovascular;  Laterality: N/A;   Family History  Problem Relation Age of Onset  . Stroke Father        @ 31  . Hypertension Father   . Hypertension Sister   . Cancer Sister 20       brain  . Breast cancer Other 42       breast  . Breast cancer Daughter 24  . Cancer Daughter 67       breast  . Cancer Other 60       colon/MELANOMA  . Stroke Mother        medication induced  . Kidney disease Brother    Social History   Socioeconomic History  . Marital status: Married    Spouse name: Not on file  . Number of children: Not on file  . Years of education: Not on file  . Highest education level: Not on file  Occupational History  . Occupation: Retired  Scientific laboratory technician  . Financial resource strain: Not on file  . Food insecurity    Worry: Not on file   Inability: Not on file  . Transportation needs    Medical: Not on file    Non-medical: Not on file  Tobacco Use  . Smoking status: Former Smoker    Quit date: 05/24/1968    Years since quitting: 50.8  . Smokeless tobacco: Never Used  . Tobacco comment: only smoked 7 years  Substance and Sexual Activity  . Alcohol use: No    Alcohol/week: 0.0 standard drinks  . Drug use: No  . Sexual activity: Never    Comment: 1st intercourse 34 yo-1 partner  Lifestyle  . Physical activity    Days per week: Not on file    Minutes per session: Not on file  . Stress: Not on file  Relationships  . Social Herbalist on phone: Not on file    Gets together: Not on file    Attends religious service: Not on file    Active member of club or organization: Not on file    Attends meetings of clubs or organizations: Not on file    Relationship  status: Not on file  . Intimate partner violence    Fear of current or ex partner: Not on file    Emotionally abused: Not on file    Physically abused: Not on file    Forced sexual activity: Not on file  Other Topics Concern  . Not on file  Social History Narrative  . Not on file    Review of Systems:  Performed and all others negative.  Physical Exam:  Vitals:   03/07/19 1013  BP: (!) 142/70  Pulse: 75  Temp: 97.8 F (36.6 C)  TempSrc: Oral  SpO2: 100%  Weight: 149 lb 8 oz (67.8 kg)  Height: 5\' 5"  (1.651 m)   Physical Exam Constitutional:      General: She is not in acute distress.    Appearance: Normal appearance.  Cardiovascular:     Rate and Rhythm: Normal rate and regular rhythm.     Pulses: Normal pulses.     Heart sounds: Normal heart sounds.  Pulmonary:     Effort: Pulmonary effort is normal. No respiratory distress.     Breath sounds: Normal breath sounds.  Abdominal:     General: Bowel sounds are normal. There is no distension.     Palpations: Abdomen is soft.     Comments: Mild RLQ and Suprapubic tenderness   Musculoskeletal:        General: No swelling or deformity.  Skin:    General: Skin is warm and dry.  Neurological:     General: No focal deficit present.     Mental Status: Mental status is at baseline.     Assessment & Plan:   See Encounters Tab for problem based charting.  Patient discussed with Dr. Evette Doffing

## 2019-03-07 NOTE — Assessment & Plan Note (Addendum)
Patient has a history of chronic genralizxed anxiety, worsened recently with her husabands diagnosis of and treatment for dementia. She requested low dose benzo's, but we discussed this not being a first line medication and she is open to starting with couseling for now. Can consider SSRI trial in future. - Referrla to [facility]

## 2019-03-07 NOTE — Patient Instructions (Addendum)
Thank you for allowing [place] to care for you  For your urinary tract infection - Please take ciprofloxacin 250mg , Two times a day for 5 days as discussed  For your anxiety - We have placed a referral to our clinic counselor  For your rhinitis - Please take Flonase nasal spray daily  Please follow up with new PCP in the next 3 months

## 2019-03-07 NOTE — Assessment & Plan Note (Addendum)
Patient is following up after several recent visit to urgent care and the ED for a UTI. She had been started on keflex, which was ineffective. She was then started on Bactrim with partial resolution of symptoms; which was switched to ciprofloxacin after culture results returned. She was hesitant to start Cipro due to listed side effect of arrhythmia given her history of PACs and PVCs treated with Flecainide by cardiology. She tried to contact her cardiologist but was unable to get the answer she needed and came to establish care with [place].  She is without PCP as she had not seen her PCP in several year and she was discharged from the clinic and her PCP was no longer taking patients.   Her last EKG showed qTC of 438. She should be safe to complete a course of ciprofloxacin. We discussed her lab results showing less than expected culture growth of to organisms (E. Fecalis and P. Aeruginosa) and that she may not have to complete further antibiotics. After discussion with the patient she would like to complete her antibiotics given her prior symptoms and improvements on partial course of bactrim. We discussed reducing the dose and duration of her Cipro, which should both reduce the size of the pill (as she had concern about being unable to swallow the 500mg  pill) and any risk of qTC prolongation. - Ciprofloxacin 250mg  BID for 5 days - Repeat Urinalysis to ensure resolution on hematuria at follow up

## 2019-03-07 NOTE — Assessment & Plan Note (Signed)
Patient describes chronic perennial rhinitis. She does not note any specific trigger. Will give a trial of fluticasone. - Fluticasone 2 sprays Daily

## 2019-03-08 NOTE — Progress Notes (Signed)
Internal Medicine Clinic Attending  Case discussed with Dr. Melvin  at the time of the visit.  We reviewed the resident's history and exam and pertinent patient test results.  I agree with the assessment, diagnosis, and plan of care documented in the resident's note.  

## 2019-03-09 ENCOUNTER — Other Ambulatory Visit: Payer: Self-pay

## 2019-03-09 ENCOUNTER — Ambulatory Visit (HOSPITAL_COMMUNITY)
Admission: EM | Admit: 2019-03-09 | Discharge: 2019-03-09 | Disposition: A | Discharge status: Home / Self Care | Payer: Medicare Other | Attending: Family Medicine | Admitting: Family Medicine

## 2019-03-09 ENCOUNTER — Telehealth: Payer: Self-pay | Admitting: Internal Medicine

## 2019-03-09 ENCOUNTER — Encounter (HOSPITAL_COMMUNITY): Payer: Self-pay

## 2019-03-09 DIAGNOSIS — N898 Other specified noninflammatory disorders of vagina: Secondary | ICD-10-CM | POA: Insufficient documentation

## 2019-03-09 DIAGNOSIS — T50905A Adverse effect of unspecified drugs, medicaments and biological substances, initial encounter: Secondary | ICD-10-CM | POA: Insufficient documentation

## 2019-03-09 LAB — POCT URINALYSIS DIP (DEVICE)
Bilirubin Urine: NEGATIVE
Glucose, UA: NEGATIVE mg/dL
Ketones, ur: NEGATIVE mg/dL
Leukocytes,Ua: NEGATIVE
Nitrite: NEGATIVE
Protein, ur: NEGATIVE mg/dL
Specific Gravity, Urine: 1.015 (ref 1.005–1.030)
Urobilinogen, UA: 0.2 mg/dL (ref 0.0–1.0)
pH: 6 (ref 5.0–8.0)

## 2019-03-09 MED ORDER — NYSTATIN 100000 UNIT/GM EX CREA: TOPICAL_CREAM | CUTANEOUS | 1 refills | Status: DC

## 2019-03-09 NOTE — Telephone Encounter (Signed)
New message:     Patient calling stating that she is not feeling good at all. She has taken everything the doctor wanted her to take, patient said she needs help. Please call patient.

## 2019-03-09 NOTE — Discharge Instructions (Addendum)
Nothing concerning on exam today. Nystatin cream twice a day Follow-up with your doctor as planned on Tuesday

## 2019-03-09 NOTE — Telephone Encounter (Signed)
Ok, thank you. Sounds like she is not tolerating the cipro well, can stop that medicine in the meantime while waiting to be evaluated.

## 2019-03-09 NOTE — Telephone Encounter (Signed)
Spoke w/ pt, she states she is on fire when she takes the abx, she is having abd pain, nausea, vaginal burning and pain, also states she thinks that the rectocele repair that she had years ago has come apart. She states she feels horrible.she called EMS yesterday and they came to her home but told her that the ED's were so busy that they would advise taking her to ED. This is concerning as pt could become seriously ill. She is advised today to call her son and have him drive her to [facility] eval, giving hx as she has done to this triage nurse. She is given a f/u appt for Tuesday [facility] at 1415 for possible referral to GYN or surg. Office for check of retocele

## 2019-03-09 NOTE — ED Triage Notes (Signed)
Patient presents to Urgent Care with complaints of bilateral leg swelling  And burning that radiates from her ankles all the way up to her groin since yesterday. Patient reports she just started taking cipro and thinks that is what is causing her swelling and pain. Pt was sent here by her pcp for medication adjustment and exam.

## 2019-03-09 NOTE — Telephone Encounter (Signed)
Pt called office for help.  Pt is having increased burning in her bladder.  She started the cipro 250 mg tablets yesterday, sounds like she has taken 2 doses.  She also states she thinks her rectocele repair has "broken".  Advised Pt to call her PCP and see if he can prescribe her something to treat burning.  Also advised PCP can refer her for evaluation of her rectocele.  Pt indicates understanding.  Will forward to her PCP.

## 2019-03-11 LAB — URINE CULTURE: Culture: 20000 — AB

## 2019-03-12 ENCOUNTER — Telehealth (HOSPITAL_COMMUNITY): Payer: Self-pay | Admitting: Emergency Medicine

## 2019-03-12 NOTE — ED Provider Notes (Signed)
[facility]    CSN: [id number] Arrival date & time: 03/09/19  1326      History   Chief Complaint Chief Complaint  Patient presents with  . Appointment    1:30  . Leg Swelling  . Follow-up    HPI KARIAH GIFFEN is a 85 y.o. female.   Pt is a 85 year old female that presents with vaginal burning and irritation, and possible allergic reaction to Cipro. Reporting not long after taking the Cipro that she had burning all over her body, rash and LE swelling. Her last dose was more than 12  hours ago and her symptoms are improving. No chest pain, SOB. No trouble swallowing or breathing. She was initially being treated with Bactrim for UTI and then was switched to Cipro. She is not taking the Bactrim. No vaginal discharge, abdominal pain or back pain. No fever. Feels like her rectum is prolapsed. Hx of rectocele repair.   ROS per HPI      Past Medical History:  Diagnosis Date  . Anemia    iron def  . Anxiety   . Arrhythmia    paroxysmal SVT and symtomatic PAC's  . Diverticulosis    colon  . History of colonic polyps    cecal  . Hypertension   . 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 Osteoporosis 07/21/2011   t score -2.7  . Palpitations 05/24/2017  . Stricture esophagus   . Vitamin D deficiency    2010    Patient Active Problem List   Diagnosis Date Noted  . UTI (urinary tract infection) 03/07/2019  . Chest tightness   . Elevated troponin   . Chest pain 05/23/2017  . Palpitations 05/23/2017  . Rotator cuff arthropathy, right 04/08/2016  . Abdominal discomfort 04/01/2016  . Shoulder pain 03/25/2016  . Arthritis of knee 03/25/2016  . Chest pain on breathing 03/14/2016  . Presbycusis of both ears 01/15/2016  . Tinnitus of right ear 01/15/2016  . Lipoma 02/04/2015  . Skin cancer of arm 09/26/2014  . Chest discomfort 01/20/2014  . Palpitation 09/28/2012  . Rhinitis 12/25/2011  . TIA (transient  ischemic attack) 07/19/2011  . ATRIAL PREMATURE BEATS 03/20/2009  . ANEMIA-IRON DEFICIENCY 04/02/2008  . Anxiety 04/02/2008  . Essential hypertension 04/02/2008  . GERD 04/02/2008  . OSTEOPOROSIS 04/02/2008    Past Surgical History:  Procedure Laterality Date  . A&P, enterocele repair     s/p 2003  . ABDOMINAL HYSTERECTOMY    . BREAST REDUCTION SURGERY     s/p 1996      PATIENT DENIES BREAST REDUCTION   . HEMORRHOID SURGERY    . LEFT HEART CATH AND CORONARY ANGIOGRAPHY N/A 05/25/2017   Procedure: LEFT HEART CATH AND CORONARY ANGIOGRAPHY;  Surgeon: Jettie Booze, MD;  Location: [facility];  Service: Cardiovascular;  Laterality: N/A;    OB History    Gravida  2   Para  2   Term      Preterm      AB      Living  1     SAB      TAB      Ectopic      Multiple      Live Births               Home Medications    Prior to Admission medications   Medication Sig Start Date End  Date Taking? Authorizing Provider  aspirin EC 325 MG tablet Take 325 mg by mouth daily as needed (headache).    [provider]  [facility] Liver Oil CAPS Take 1 capsule by mouth daily.    [provider]  conjugated estrogens (PREMARIN) vaginal cream Place 1 Applicatorful vaginally daily. 02/16/19   Maudie Flakes, MD  flecainide (TAMBOCOR) 50 MG tablet Take 2 tablets in the AM and 1 tablet at night Patient taking differently: Take 50-100 mg by mouth See admin instructions. Take 2 tablets (200 mg) by mouth every morning and 1 tablet (50 mg) at night 05/15/18   Evans Lance, MD  fluticasone [facility]) 50 MCG/ACT nasal spray Place 2 sprays into both nostrils daily. 03/07/19   [name], MD  nystatin cream (MYCOSTATIN) Apply to affected area 2 times daily 03/09/19   Loura Halt A, NP  SALINE NASAL SPRAY NA Place 1 spray into both nostrils every other day. [employer] saline nasal spray    [provider]    Family History Family History  Problem  Relation Age of Onset  . Stroke Father        @ 9  . Hypertension Father   . Hypertension Sister   . Cancer Sister 70       brain  . Breast cancer Other 42       breast  . Breast cancer Daughter 61  . Cancer Daughter 50       breast  . Cancer Other 26       colon/MELANOMA  . Stroke Mother        medication induced  . Kidney disease Brother   . Cancer Brother     Social History Social History   Tobacco Use  . Smoking status: Former Smoker    Quit date: 05/24/1968    Years since quitting: 50.8  . Smokeless tobacco: Never Used  . Tobacco comment: only smoked 7 years  Substance Use Topics  . Alcohol use: No    Alcohol/week: 0.0 standard drinks  . Drug use: No     Allergies   Other, Pantoprazole, Zyrtec [cetirizine], and Ciprofloxacin   Review of Systems Review of Systems   Physical Exam Triage Vital Signs ED Triage Vitals  Enc Vitals Group     BP 03/09/19 1345 (!) 154/71     Pulse --      Resp 03/09/19 1345 18     Temp 03/09/19 1345 97.8 F (36.6 C)     Temp Source 03/09/19 1345 Oral     SpO2 03/09/19 1345 100 %     Weight --      Height --      Head Circumference --      Peak Flow --      Pain Score 03/09/19 1343 8     Pain Loc --      Pain Edu? --      Excl. in [place]? --    No data found.  Updated Vital Signs BP (!) 154/71 (BP Location: Right Arm)   Temp 97.8 F (36.6 C) (Oral)   Resp 18   LMP 05/24/1970   SpO2 100%   Visual Acuity Right Eye Distance:   Left Eye Distance:   Bilateral Distance:    Right Eye Near:   Left Eye Near:    Bilateral Near:     Physical Exam Vitals signs and nursing note reviewed.  Constitutional:      General: She is not in  acute distress.    Appearance: Normal appearance. She is not ill-appearing, toxic-appearing or diaphoretic.  HENT:     Head: Normocephalic.     Nose: Nose normal.     Mouth/Throat:     Pharynx: Oropharynx is clear.  Eyes:     Conjunctiva/sclera: Conjunctivae normal.  Neck:      Musculoskeletal: Normal range of motion.  Pulmonary:     Effort: Pulmonary effort is normal.  Abdominal:     Palpations: Abdomen is soft.     Tenderness: There is no abdominal tenderness.  Genitourinary:    Vagina: No vaginal discharge.     Comments: Very erythematous vulva with scant discharge.  No rectal prolapse.  Musculoskeletal: Normal range of motion.  Skin:    General: Skin is warm and dry.     Findings: No rash.  Neurological:     Mental Status: She is alert.  Psychiatric:        Mood and Affect: Mood normal.      UC Treatments / Results  Labs (all labs ordered are listed, but only abnormal results are displayed) Labs Reviewed  URINE CULTURE - Abnormal; Notable for the following components:      Result Value   Culture 20,000 COLONIES/mL ENTEROCOCCUS FAECALIS (*)    Organism ID, Bacteria ENTEROCOCCUS FAECALIS (*)    All other components within normal limits  POCT URINALYSIS DIP (DEVICE) - Abnormal; Notable for the following components:   Hgb urine dipstick TRACE (*)    All other components within normal limits    EKG   Radiology No results found.  Procedures Procedures (including critical care time)  Medications Ordered in UC Medications - No data to display  Initial Impression / Assessment and Plan / UC Course  I have reviewed the triage vital signs and the nursing notes.  Pertinent labs & imaging results that were available during my care of the patient were reviewed by me and considered in my medical decision making (see chart for details).     Vaginal irration most likely from yeast infection or irritation from wiping.  Prescribing nystatin cream 2 x day for comfort.  No rectocele.   Allergic reaction- allergic reaction to Cipro Instructed to stop the medication.  No red flags  She will follow up with her PCP on Tuesday.   Final Clinical Impressions(s) / UC Diagnoses   Final diagnoses:  Vaginal irritation     Discharge Instructions      Nothing concerning on exam today. Nystatin cream twice a day Follow-up with your doctor as planned on Tuesday      ED Prescriptions    Medication Sig Dispense Auth. Provider   nystatin cream (MYCOSTATIN) Apply to affected area 2 times daily 30 g Charnele Semple A, NP     PDMP not reviewed this encounter.   Orvan July, NP 03/12/19 [phone]

## 2019-03-12 NOTE — Telephone Encounter (Signed)
OOnly 20,00o colonies, pt has been on multiple antibiotics, Attempted to reach patient to see how she was doing. No answer at this time. Voicemail left. Report forwarded to PCP

## 2019-03-13 ENCOUNTER — Encounter: Payer: Self-pay | Admitting: Internal Medicine

## 2019-03-13 ENCOUNTER — Other Ambulatory Visit: Payer: Self-pay

## 2019-03-13 ENCOUNTER — Telehealth: Payer: Self-pay | Admitting: Licensed Clinical Social Worker

## 2019-03-13 ENCOUNTER — Ambulatory Visit (INDEPENDENT_AMBULATORY_CARE_PROVIDER_SITE_OTHER): Payer: Medicare Other | Admitting: Internal Medicine

## 2019-03-13 VITALS — BP 145/78 | HR 75 | Temp 98.5°F | Ht 65.0 in | Wt 147.7 lb

## 2019-03-13 DIAGNOSIS — T7840XA Allergy, unspecified, initial encounter: Secondary | ICD-10-CM | POA: Insufficient documentation

## 2019-03-13 DIAGNOSIS — N3001 Acute cystitis with hematuria: Secondary | ICD-10-CM

## 2019-03-13 DIAGNOSIS — I10 Essential (primary) hypertension: Secondary | ICD-10-CM | POA: Diagnosis not present

## 2019-03-13 DIAGNOSIS — Z9889 Other specified postprocedural states: Secondary | ICD-10-CM | POA: Diagnosis not present

## 2019-03-13 DIAGNOSIS — T7840XD Allergy, unspecified, subsequent encounter: Secondary | ICD-10-CM

## 2019-03-13 NOTE — Telephone Encounter (Signed)
Patient was called to discuss the referral from her doctor. Pt did not answer, and a vm was left for the patient.

## 2019-03-13 NOTE — Assessment & Plan Note (Addendum)
Patient expresses concern of recurrence of rectocele based on symptoms of intermittent pain in her lower abdomen. Given her history of prior surgery will refer for specialist evaluation per her request. - Referral to Urogynecology

## 2019-03-13 NOTE — Assessment & Plan Note (Signed)
Patient following up after being prescribed ciprofloxacin for a UTI based on sensitivities and having an allergic reaction to this medication. She presented to the ED for her reaction (see separate documentation). She has had no further urinary frequency nor hesitancy. - Will monitor for recurrence for now - Referral to Urogynecology  for evaluation of recurrence of rectocele, given her history of repair.

## 2019-03-13 NOTE — Assessment & Plan Note (Addendum)
BP elevated today at 176/86 and 145/78 on recheck. She states it was elevated due to the long walk to our clinic. She states she has reading in the 120s at home. She does not want to start medication today due to history of side effects. Will have her follow up to meet PCP and recheck BP at that time. - Continue to monitor - PCP follow up in about 3 months

## 2019-03-13 NOTE — Assessment & Plan Note (Addendum)
Patient is following up after developing a rash following initiation of ciprofloxacin for ehr recent UTI. She was seen by urgent care about 12 hours after he last dose and symptoms were improving at that time. Now, 4 days later, her rash continues to improved but is still visible. Medication has been added to her allergy list by urgent care.

## 2019-03-13 NOTE — Progress Notes (Signed)
   CC: UTI, Allergic reaction, Hypertension, History of rectocele repair  HPI:  [name] [name] is a 85 y.o. F with PMHx listed below presenting for UTI, Allergic reaction, Hypertension, History of rectocele repair. Please see the A&P for the status of the patient's chronic medical problems.  Past Medical History:  Diagnosis Date  . Anemia    iron def  . Anxiety   . Arrhythmia    paroxysmal SVT and symtomatic PAC's  . Diverticulosis    colon  . History of colonic polyps    cecal  . Hypertension   . 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 Osteoporosis 07/21/2011   t score -2.7  . Palpitations 05/24/2017  . Stricture esophagus   . Vitamin D deficiency    2010   Review of Systems:  Performed and all others negative.  Physical Exam:  Vitals:   03/13/19 1417 03/13/19 1423  BP: (!) 176/86 (!) 145/78  Pulse: 76 75  Temp: 98.5 F (36.9 C)   TempSrc: Oral   SpO2: 99%   Weight: 147 lb 11.2 oz (67 kg)   Height: 5\' 5"  (1.651 m)    Physical Exam Constitutional:      General: She is not in acute distress.    Appearance: Normal appearance.  Cardiovascular:     Rate and Rhythm: Normal rate and regular rhythm.     Pulses: Normal pulses.     Heart sounds: Normal heart sounds.  Pulmonary:     Effort: Pulmonary effort is normal. No respiratory distress.     Breath sounds: Normal breath sounds.  Abdominal:     General: Bowel sounds are normal. There is no distension.     Palpations: Abdomen is soft.     Comments: Mild RLQ and Suprapubic tenderness  Musculoskeletal:        General: No swelling or deformity.  Skin:    General: Skin is warm and dry.  Neurological:     General: No focal deficit present.     Mental Status: Mental status is at baseline.     Assessment & Plan:   See Encounters Tab for problem based charting.  Patient discussed with Dr. Heber

## 2019-03-13 NOTE — Patient Instructions (Addendum)
Thank you for allowing [place] to care for you  For your history of rectocele repair and recurrent pain - Referral to urogynacology placed for further evaluation  Follow up with your new PCP in the next 3 months

## 2019-03-14 ENCOUNTER — Encounter: Payer: Self-pay | Admitting: Licensed Clinical Social Worker

## 2019-03-14 NOTE — Progress Notes (Signed)
Internal Medicine Clinic Attending  Case discussed with Dr. Melvin  at the time of the visit.  We reviewed the resident's history and exam and pertinent patient test results.  I agree with the assessment, diagnosis, and plan of care documented in the resident's note.  

## 2019-03-21 ENCOUNTER — Telehealth: Payer: Self-pay | Admitting: *Deleted

## 2019-03-21 ENCOUNTER — Telehealth: Payer: Self-pay | Admitting: Licensed Clinical Social Worker

## 2019-03-21 NOTE — Telephone Encounter (Signed)
Patient was called, and wants to participate in services. Patient reported she will call our office to schedule as soon as she knows her schedule.

## 2019-03-21 NOTE — Telephone Encounter (Signed)
Call from pt - stated she saw Dr Trilby Drummer recently and was told to call if she develops any changes. Stated her ankles are swollen, still pink and turning dark. Also she has spots on her arms; and urine is dark. Please call pt or does she needs to schedule a in person appt or telehealth? Thanks

## 2019-03-21 NOTE — Telephone Encounter (Signed)
Spoke with patient by phone. She reports continued skin changes of her lower extremities that started with recent drug reaction. She denies any pain, but states she has persistent swelling of her ankles, which she had never had before her drug reaction.   She has some darkening of her urine. Reports darker yellow color, not red nor brown. No pain with urination.  We discussed it would be a good idea for her to come in and be seen, she will visit this Friday.

## 2019-03-23 ENCOUNTER — Other Ambulatory Visit: Payer: Self-pay

## 2019-03-23 ENCOUNTER — Ambulatory Visit (INDEPENDENT_AMBULATORY_CARE_PROVIDER_SITE_OTHER): Payer: Medicare Other | Admitting: Internal Medicine

## 2019-03-23 VITALS — BP 151/74 | HR 70 | Temp 98.1°F | Ht 65.0 in | Wt 147.0 lb

## 2019-03-23 DIAGNOSIS — T7840XD Allergy, unspecified, subsequent encounter: Secondary | ICD-10-CM | POA: Diagnosis not present

## 2019-03-23 DIAGNOSIS — R300 Dysuria: Secondary | ICD-10-CM | POA: Diagnosis not present

## 2019-03-23 DIAGNOSIS — I10 Essential (primary) hypertension: Secondary | ICD-10-CM

## 2019-03-23 LAB — POCT URINALYSIS DIPSTICK
Bilirubin, UA: NEGATIVE
Glucose, UA: NEGATIVE
Ketones, UA: NEGATIVE
Leukocytes, UA: NEGATIVE
Nitrite, UA: NEGATIVE
Protein, UA: NEGATIVE
Spec Grav, UA: 1.01 (ref 1.010–1.025)
Urobilinogen, UA: 0.2 E.U./dL
pH, UA: 7 (ref 5.0–8.0)

## 2019-03-23 NOTE — Progress Notes (Signed)
   CC: Medication reaction, Dysuria, Hypertension  HPI:  [name] is a 85 y.o. F with PMHx listed below presenting for Medication reaction, Dysuria, Hypertension. Please see the A&P for the status of the patient's chronic medical problems.  Past Medical History:  Diagnosis Date  . Anemia    iron def  . Anxiety   . Arrhythmia    paroxysmal SVT and symtomatic PAC's  . Diverticulosis    colon  . History of colonic polyps    cecal  . Hypertension   . 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 Osteoporosis 07/21/2011   t score -2.7  . Palpitations 05/24/2017  . Stricture esophagus   . Vitamin D deficiency    2010   Review of Systems:  Performed and all others negative.  Physical Exam:  Vitals:   03/23/19 1016 03/23/19 1105 03/23/19 1109  BP: (!) 172/80 (!) 154/73 (!) 151/74  Pulse: 71 68 70  Temp: 98.1 F (36.7 C)    TempSrc: Oral    SpO2: 99%    Weight: 147 lb (66.7 kg)    Height: 5\' 5"  (1.651 m)     Physical Exam Constitutional:      General: She is not in acute distress.    Appearance: Normal appearance.  Cardiovascular:     Rate and Rhythm: Normal rate and regular rhythm.     Pulses: Normal pulses.     Heart sounds: Normal heart sounds.  Pulmonary:     Effort: Pulmonary effort is normal. No respiratory distress.     Breath sounds: Normal breath sounds.  Abdominal:     General: Bowel sounds are normal. There is no distension.     Palpations: Abdomen is soft.     Comments: Mild RLQ and Suprapubic tenderness  Musculoskeletal:        General: No swelling or deformity.  Skin:    General: Skin is warm and dry.     Comments: Skin change on bilateral UEs and LEs Dry skin with red macules, similar to prior exam Trace ankle edema, no tenderness Photos in chart  Neurological:     General: No focal deficit present.     Mental Status: Mental status is at baseline.      Assessment & Plan:   See  Encounters Tab for problem based charting.  Patient discussed with Dr. Heber [place]

## 2019-03-23 NOTE — Patient Instructions (Addendum)
Thank you for allowing [place] to care for you  For your skin changes related to a medication reaction - We will continue to monitor this for now, it can take several weeks for this to improve - Apply moisturizer several times per day - We will arrange for PCP follow up to monitor progression  For your intermittent pain with urination - We are checking urine studies today  For your elevated Blood pressure - If this remains elevated at your PCP follow up visit, you will likely be started on medication

## 2019-03-24 LAB — URINALYSIS, COMPLETE
Bilirubin, UA: NEGATIVE
Glucose, UA: NEGATIVE
Ketones, UA: NEGATIVE
Leukocytes,UA: NEGATIVE
Nitrite, UA: NEGATIVE
Protein,UA: NEGATIVE
RBC, UA: NEGATIVE
Specific Gravity, UA: 1.005 (ref 1.005–1.030)
Urobilinogen, Ur: 0.2 mg/dL (ref 0.2–1.0)
pH, UA: 7 (ref 5.0–7.5)

## 2019-03-24 LAB — MICROSCOPIC EXAMINATION
Bacteria, UA: NONE SEEN
Casts: NONE SEEN /lpf

## 2019-03-25 ENCOUNTER — Encounter: Payer: Self-pay | Admitting: Internal Medicine

## 2019-03-25 NOTE — Assessment & Plan Note (Signed)
Patient is returning again for further follow up of her rash related to a drug reaction the rash first started, as previously documented, following her first couple of doses of ciprofloxacin. It stopped worsening and started improving on discontinuation of the offending medication. The patient called recently due to concern of the persistence of the rash a possible increased edema. She was asked to come it so the rash could be re-evaluated in person.  On exam, the rash appears stable from previous examination with a few new erythematous macules noted (which appear to be unroofed lesion due to increased dryness and friablity of skin, which is stable). She states she has been working to keep her skin moisturized. She continues to have trace ankle edema that has not worsened since previous visit. Rash continues to be on arms and legs only. Will continue to monitor and give her a opportunity to heal over the coming weeks. Return precautions given and patient advised to continue to keep area moisturized. - Photos placed in chart for comparison - Moisturize multiple times per day - Continue to monitor

## 2019-03-25 NOTE — Assessment & Plan Note (Signed)
Patient reports some continued intermittent dysuria. She has referral in place for urogynecology visit given her history of prior rectocele repair and some lower abdominal fullness for further evaluation, scheduled to occur on 04/03/2019 per patient.. Will recheck U/A today given she has only had POC Dip at previous visit with [place] (as full U/A was done in ED). - POC U/A showed trace blood - Formal U/A did not show blood nor RBC and was other wise WNL

## 2019-03-25 NOTE — Assessment & Plan Note (Signed)
BP elevated to [id number] systolic today. At her last visit she reported normal blood pressures at home but states she has noted some elevated blood pressures at home recently. She remains hesitant to start blood pressure medicine. We discussed that it would be importent to start a medication given the persistent of her elevated blood pressure and she agreed to start a new medication if her blood pressure remains elevated at follow up given her elevated readings at home. - Continue to monitor - Recheck at follow up and if this remains elevated we will start antihypertensive agent.

## 2019-03-26 NOTE — Progress Notes (Signed)
Internal Medicine Clinic Attending  Case discussed with Dr. Melvin  at the time of the visit.  We reviewed the resident's history and exam and pertinent patient test results.  I agree with the assessment, diagnosis, and plan of care documented in the resident's note.  

## 2019-03-28 ENCOUNTER — Telehealth: Payer: Self-pay | Admitting: Licensed Clinical Social Worker

## 2019-03-28 NOTE — Telephone Encounter (Signed)
Patient was contacted to discuss the referral and schedule an appointment (2nd attempt). Patient reported that she wanted to wait until the 10th to schedule an appointment, and would call our office.

## 2019-04-03 ENCOUNTER — Encounter: Payer: Self-pay | Admitting: Internal Medicine

## 2019-04-03 DIAGNOSIS — R1031 Right lower quadrant pain: Secondary | ICD-10-CM | POA: Diagnosis not present

## 2019-04-03 NOTE — Progress Notes (Signed)
Patient sent letter informing her of normal urine study results.

## 2019-04-09 NOTE — Addendum Note (Signed)
Addended by: Hulan Fray on: 04/09/2019 05:46 PM   Modules accepted: Orders

## 2019-04-26 ENCOUNTER — Ambulatory Visit (INDEPENDENT_AMBULATORY_CARE_PROVIDER_SITE_OTHER): Payer: Medicare Other | Admitting: Internal Medicine

## 2019-04-26 ENCOUNTER — Other Ambulatory Visit: Payer: Self-pay

## 2019-04-26 ENCOUNTER — Encounter: Payer: Self-pay | Admitting: Internal Medicine

## 2019-04-26 VITALS — BP 138/74 | HR 72 | Ht 65.0 in | Wt 142.3 lb

## 2019-04-26 DIAGNOSIS — R002 Palpitations: Secondary | ICD-10-CM | POA: Diagnosis not present

## 2019-04-26 DIAGNOSIS — I491 Atrial premature depolarization: Secondary | ICD-10-CM | POA: Diagnosis not present

## 2019-04-26 DIAGNOSIS — I10 Essential (primary) hypertension: Secondary | ICD-10-CM | POA: Diagnosis not present

## 2019-04-26 NOTE — Progress Notes (Signed)
HPI [name] [name] returns today for followup of palpitations due to PAC's and PVC's. She is a pleasant but anxious 85 yo woman with a h/o the above. She has been under more stress as her husband has developed dementia. She has been on flecainide for almost 20 years. She wore a cardiac monitor which demonstrated PVC's and PAC's but no sustained arrhythmias. She denies chest pain. She wonders if she can have a glass of wine at night. She admits that she is taking her husband's klonazapam. She is saddened by the loss of her husband who died 3 weeks ago. Allergies  Allergen Reactions  . Other Anaphylaxis    Reaction to GI Cocktail  . Pantoprazole Other (See Comments)    Upset stomach and weight loss  . Zyrtec [Cetirizine] Palpitations  . Ciprofloxacin     Rash with burning all over body     Current Outpatient Medications  Medication Sig Dispense Refill  . aspirin EC 325 MG tablet Take 325 mg by mouth daily as needed (headache).    Marland Kitchen Cod Liver Oil CAPS Take 1 capsule by mouth daily.    Marland Kitchen conjugated estrogens (PREMARIN) vaginal cream Place 1 Applicatorful vaginally daily. 30 g 0  . flecainide (TAMBOCOR) 50 MG tablet Take 2 tablets in the AM and 1 tablet at night (Patient taking differently: Take 50-100 mg by mouth See admin instructions. Take 2 tablets (200 mg) by mouth every morning and 1 tablet (50 mg) at night) 270 tablet 3  . fluticasone (FLONASE) 50 MCG/ACT nasal spray Place 2 sprays into both nostrils daily. 15.8 mL 2  . nystatin cream (MYCOSTATIN) Apply to affected area 2 times daily 30 g 1  . SALINE NASAL SPRAY NA Place 1 spray into both nostrils every other day. [employer] saline nasal spray     No current facility-administered medications for this visit.      Past Medical History:  Diagnosis Date  . Anemia    iron def  . Anxiety   . Arrhythmia    paroxysmal SVT and symtomatic PAC's  . Diverticulosis    colon  . History of colonic polyps    cecal  . Hypertension   .  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 Osteoporosis 07/21/2011   t score -2.7  . Palpitations 05/24/2017  . Stricture esophagus   . Vitamin D deficiency    2010    ROS:   All systems reviewed and negative except as noted in the HPI.   Past Surgical History:  Procedure Laterality Date  . A&P, enterocele repair     s/p 2003  . ABDOMINAL HYSTERECTOMY    . BREAST REDUCTION SURGERY     s/p 1996      PATIENT DENIES BREAST REDUCTION   . HEMORRHOID SURGERY    . LEFT HEART CATH AND CORONARY ANGIOGRAPHY N/A 05/25/2017   Procedure: LEFT HEART CATH AND CORONARY ANGIOGRAPHY;  Surgeon: Jettie Booze, MD;  Location: [facility];  Service: Cardiovascular;  Laterality: N/A;     Family History  Problem Relation Age of Onset  . Stroke Father        @ 51  . Hypertension Father   . Hypertension Sister   . Cancer Sister 44       brain  . Breast cancer Other 42       breast  . Breast cancer Daughter 59  .  Cancer Daughter 32       breast  . Cancer Other 83       colon/MELANOMA  . Stroke Mother        medication induced  . Kidney disease Brother   . Cancer Brother      Social History   Socioeconomic History  . Marital status: Married    Spouse name: Not on file  . Number of children: Not on file  . Years of education: Not on file  . Highest education level: Not on file  Occupational History  . Occupation: Retired  Scientific laboratory technician  . Financial resource strain: Not on file  . Food insecurity    Worry: Not on file    Inability: Not on file  . Transportation needs    Medical: Not on file    Non-medical: Not on file  Tobacco Use  . Smoking status: Former Smoker    Quit date: 05/24/1968    Years since quitting: 50.9  . Smokeless tobacco: Never Used  . Tobacco comment: only smoked 7 years  Substance and Sexual Activity  . Alcohol use: No    Alcohol/week: 0.0 standard drinks  . Drug use: No  . Sexual activity:  Never    Comment: 1st intercourse 89 yo-1 partner  Lifestyle  . Physical activity    Days per week: Not on file    Minutes per session: Not on file  . Stress: Not on file  Relationships  . Social Herbalist on phone: Not on file    Gets together: Not on file    Attends religious service: Not on file    Active member of club or organization: Not on file    Attends meetings of clubs or organizations: Not on file    Relationship status: Not on file  . Intimate partner violence    Fear of current or ex partner: Not on file    Emotionally abused: Not on file    Physically abused: Not on file    Forced sexual activity: Not on file  Other Topics Concern  . Not on file  Social History Narrative  . Not on file     BP 138/74   Pulse 72   Ht 5\' 5"  (1.651 m)   Wt 142 lb 4.8 oz (64.5 kg)   LMP 05/24/1970   SpO2 96%   BMI 23.68 kg/m   Physical Exam:  Well appearing NAD HEENT: Unremarkable Neck:  No JVD, no thyromegally Lymphatics:  No adenopathy Back:  No CVA tenderness Lungs:  Clear with no wheezes HEART:  Regular rate rhythm, no murmurs, no rubs, no clicks Abd:  soft, positive bowel sounds, no organomegally, no rebound, no guarding Ext:  2 plus pulses, no edema, no cyanosis, no clubbing Skin:  No rashes no nodules Neuro:  CN II through XII intact, motor grossly intact  EKG - nsr  Assess/Plan: 1. PAC's and PVC's - her symptoms are well controlled. She will continue her flecainide. 2. HTN - her bp is well controlled. She has been under more stress.  3. Atypical chest pain - her symptoms are controlled.   Mikle Bosworth.D.

## 2019-04-26 NOTE — Patient Instructions (Addendum)

## 2019-05-07 ENCOUNTER — Other Ambulatory Visit: Payer: Self-pay

## 2019-05-07 MED ORDER — FLECAINIDE ACETATE 50 MG PO TABS: 50.0000 mg | ORAL_TABLET | ORAL | 2 refills | Status: DC

## 2019-05-09 ENCOUNTER — Encounter: Payer: Self-pay | Admitting: Licensed Clinical Social Worker

## 2019-06-04 ENCOUNTER — Ambulatory Visit (INDEPENDENT_AMBULATORY_CARE_PROVIDER_SITE_OTHER): Payer: Medicare Other | Admitting: Internal Medicine

## 2019-06-04 ENCOUNTER — Other Ambulatory Visit: Payer: Self-pay

## 2019-06-04 ENCOUNTER — Encounter: Payer: Self-pay | Admitting: Internal Medicine

## 2019-06-04 VITALS — BP 144/77 | HR 75 | Temp 98.0°F | Ht 65.0 in | Wt 144.0 lb

## 2019-06-04 DIAGNOSIS — I83893 Varicose veins of bilateral lower extremities with other complications: Secondary | ICD-10-CM

## 2019-06-04 DIAGNOSIS — F419 Anxiety disorder, unspecified: Secondary | ICD-10-CM | POA: Diagnosis not present

## 2019-06-04 DIAGNOSIS — I10 Essential (primary) hypertension: Secondary | ICD-10-CM

## 2019-06-04 DIAGNOSIS — J3089 Other allergic rhinitis: Secondary | ICD-10-CM | POA: Diagnosis not present

## 2019-06-04 DIAGNOSIS — E785 Hyperlipidemia, unspecified: Secondary | ICD-10-CM | POA: Insufficient documentation

## 2019-06-04 DIAGNOSIS — I872 Venous insufficiency (chronic) (peripheral): Secondary | ICD-10-CM | POA: Insufficient documentation

## 2019-06-04 DIAGNOSIS — M79604 Pain in right leg: Secondary | ICD-10-CM

## 2019-06-04 DIAGNOSIS — Z8673 Personal history of transient ischemic attack (TIA), and cerebral infarction without residual deficits: Secondary | ICD-10-CM | POA: Diagnosis not present

## 2019-06-04 DIAGNOSIS — M1710 Unilateral primary osteoarthritis, unspecified knee: Secondary | ICD-10-CM

## 2019-06-04 DIAGNOSIS — M79606 Pain in leg, unspecified: Secondary | ICD-10-CM | POA: Insufficient documentation

## 2019-06-04 MED ORDER — LORATADINE 10 MG PO TABS: 10.0000 mg | ORAL_TABLET | Freq: Every day | ORAL | 2 refills | Status: DC

## 2019-06-04 NOTE — Assessment & Plan Note (Addendum)
Patient states that she has had increased pain in her right knee for the last 2 years.  The pain is associated with a fluctuant mass on the posterior aspect of her knee.  She states that the majority of her pain is located in this area and is significantly worse while walking.  The pain has limited her ability to exercise.  She takes aspirin as needed to handle the pain.  I counseled the patient on the importance of using NSAID such as ibuprofen and naproxen for pain and antiinflammation.  Advised her that she could use Tylenol as well for pain.  Patient states that she does have a history of TIA and a 15-year history of hormone replacement therapy for menopause symptoms.  Considering this information, I will get a vascular ultrasound of her right lower extremity to rule out deep vein thrombosis.  Although my #1 differential is a Baker's cyst in the setting of osteoarthritis.  I will bring patient back in 2 weeks to perform an arthrocentesis with steroid injections.\  Plan: -Asked the patient return in 2 weeks for arthrocentesis/steroid injections -Use NSAIDs for pain and inflammation

## 2019-06-04 NOTE — Assessment & Plan Note (Addendum)
Patient has been taking antihistamine medications for post nasal drip.   Plan: - Prescribed Claritin as needed - Re-evaluate in a few weeks.

## 2019-06-04 NOTE — Assessment & Plan Note (Addendum)
Patient States that she has significant varicose veins. She would like to see a vascular surgeon for further evaluation and management.

## 2019-06-04 NOTE — Progress Notes (Signed)
CC: Leg pain  HPI:  [name] is a 86 y.o. female with a past medical history stated below and presents today for leg pain and anxiety. Please see problem based assessment and plan for additional details.   Past Medical History:  Diagnosis Date  . Anemia    iron def  . Anxiety   . Arrhythmia    paroxysmal SVT and symtomatic PAC's  . Diverticulosis    colon  . History of colonic polyps    cecal  . Hypertension   . 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 Osteoporosis 07/21/2011   t score -2.7  . Palpitations 05/24/2017  . Stricture esophagus   . Vitamin D deficiency    2010    Social History   Socioeconomic History  . Marital status: Married    Spouse name: Not on file  . Number of children: Not on file  . Years of education: Not on file  . Highest education level: Not on file  Occupational History  . Occupation: Retired  Tobacco Use  . Smoking status: Former Smoker    Quit date: 05/24/1968    Years since quitting: 51.0  . Smokeless tobacco: Never Used  . Tobacco comment: only smoked 7 years  Substance and Sexual Activity  . Alcohol use: No    Alcohol/week: 0.0 standard drinks  . Drug use: No  . Sexual activity: Never    Comment: 1st intercourse 22 yo-1 partner  Other Topics Concern  . Not on file  Social History Narrative  . Not on file   Social Determinants of Health   Financial Resource Strain:   . Difficulty of Paying Living Expenses: Not on file  Food Insecurity:   . Worried About Charity fundraiser in the Last Year: Not on file  . Ran Out of Food in the Last Year: Not on file  Transportation Needs:   . Lack of Transportation (Medical): Not on file  . Lack of Transportation (Non-Medical): Not on file  Physical Activity:   . Days of Exercise per Week: Not on file  . Minutes of Exercise per Session: Not on file  Stress:   . Feeling of Stress : Not on file  Social Connections:   .  Frequency of Communication with Friends and Family: Not on file  . Frequency of Social Gatherings with Friends and Family: Not on file  . Attends Religious Services: Not on file  . Active Member of Clubs or Organizations: Not on file  . Attends Archivist Meetings: Not on file  . Marital Status: Not on file  Intimate Partner Violence:   . Fear of Current or Ex-Partner: Not on file  . Emotionally Abused: Not on file  . Physically Abused: Not on file  . Sexually Abused: Not on file    Family History  Problem Relation Age of Onset  . Stroke Father        @ 65  . Hypertension Father   . Hypertension Sister   . Cancer Sister 42       brain  . Breast cancer Other 42       breast  . Breast cancer Daughter 20  . Cancer Daughter 83       breast  . Cancer Other 76       colon/MELANOMA  . Stroke Mother        medication induced  . Kidney disease  Brother   . Cancer Brother       Review of Systems: Review of Systems  Constitutional: Negative for chills, diaphoresis, fever, malaise/fatigue and weight loss.  HENT: Negative for hearing loss.   Respiratory: Negative for cough, sputum production and shortness of breath.   Cardiovascular: Positive for palpitations and leg swelling. Negative for chest pain, orthopnea, claudication and PND.  Gastrointestinal: Negative for abdominal pain, blood in stool, constipation and heartburn.  Genitourinary: Negative for dysuria and urgency.  Musculoskeletal: Negative for myalgias.       Right leg pain  Skin: Negative for rash.  Neurological: Negative for weakness.  Psychiatric/Behavioral: Positive for depression. The patient is nervous/anxious.      There were no vitals filed for this visit.   Physical Exam: Physical Exam  Constitutional: She is oriented to person, place, and time and well-developed, well-nourished, and in no distress.  HENT:  Head: Normocephalic and atraumatic.  Eyes: EOM are normal.  Cardiovascular: Normal  rate, regular rhythm and intact distal pulses. Exam reveals no gallop and no friction rub.  No murmur heard. Pulmonary/Chest: Effort normal and breath sounds normal.  Abdominal: Soft. She exhibits no distension. There is no abdominal tenderness.  Musculoskeletal:        General: Tenderness and edema present. Normal range of motion.     Cervical back: Normal range of motion.  Neurological: She is alert and oriented to person, place, and time.  Skin: Skin is warm and dry.     Assessment & Plan:   See Encounters Tab for problem based charting.  Patient discussed with Dr. Marcelo Baldy

## 2019-06-04 NOTE — Assessment & Plan Note (Signed)
Patient is not currently on antihypertensive medications. Blood pressure is 144/77.  Plan: - Follow up BP in a few weeks.

## 2019-06-04 NOTE — Assessment & Plan Note (Signed)
Developed leg pain roughly two years ago.

## 2019-06-04 NOTE — Assessment & Plan Note (Addendum)
Patient states that she has been taking her husbands clonazepam for anxiety. Her husband recently passed away. She was recently referred to [facility], but needed to reschedule her appointment. She believes that she is having anxiety. She denies s/sx of agoraphobia/panic attacks.  She states that her primary reason for feeling anxiety centers around getting her husband's funeral arrangements together. Her son lives beside her and helps a lot.   She still drive to the grocery store which is 2 miles away. He son drives her any other place she needs to go. She does not drive on the interstates, or at night. She states that she had a difficult time driving at night due to decreased visibility. She states that she does have decreased sleep since her husband passed away. She states that she is sleeping about 6 hours a night. She states that she is not eating a lot and admits to less appetite since her husband passed. She does try to supplement her diet with ensure protein drinks. She admits to having good energy and still has interest in her normal activities.  Patient is likely suffering from adjustment disorder in the setting of her recent loss of her husband.  Plan:  - I counseled the patient's on the importance of establishing care with [facility] for counseling to deal with the recent changes in her life. - If she continues to have significant symptoms outside the diagnosis of adjustment disorder, I will consider prescribing an SSRI medication to deal with her psychiatric symptoms.

## 2019-06-04 NOTE — Patient Instructions (Signed)
Thank you, [name] [name] for allowing [place] to provide your care today. Today we discussed Anxiety, HTN, Arthritis, Rhinitis.    I have ordered CBC, BMP, Lipid panel labs for you. I will call if any are abnormal.    I have place a referrals to Vascular surgery for varicose veins.   I have ordered the following tests: Ultra sound of right leg  I have ordered the following medication/changed the following medications: Claritin for rhinitis   Please follow-up in 2 weeks for knee injection.  Please follow up with Miquel Dunn for counseling.    Should you have any questions or concerns please call the internal medicine clinic at [phone].    [name], D.O. [facility]

## 2019-06-05 LAB — BMP8+ANION GAP
Anion Gap: 12 mmol/L (ref 10.0–18.0)
BUN/Creatinine Ratio: 23 (ref 12–28)
BUN: 14 mg/dL (ref 8–27)
CO2: 28 mmol/L (ref 20–29)
Calcium: 9.9 mg/dL (ref 8.7–10.3)
Chloride: 100 mmol/L (ref 96–106)
Creatinine, Ser: 0.61 mg/dL (ref 0.57–1.00)
GFR calc Af Amer: 94 mL/min/{1.73_m2} (ref >59)
GFR calc non Af Amer: 82 mL/min/{1.73_m2} (ref >59)
Glucose: 89 mg/dL (ref 65–99)
Potassium: 4.3 mmol/L (ref 3.5–5.2)
Sodium: 140 mmol/L (ref 134–144)

## 2019-06-05 LAB — CBC
Hematocrit: 40.4 % (ref 34.0–46.6)
Hemoglobin: 13.5 g/dL (ref 11.1–15.9)
MCH: 30.8 pg (ref 26.6–33.0)
MCHC: 33.4 g/dL (ref 31.5–35.7)
MCV: 92 fL (ref 79–97)
Platelets: 301 10*3/uL (ref 150–450)
RBC: 4.39 x10E6/uL (ref 3.77–5.28)
RDW: 12.7 % (ref 11.7–15.4)
WBC: 5 10*3/uL (ref 3.4–10.8)

## 2019-06-05 LAB — LIPID PANEL
Chol/HDL Ratio: 2.8 ratio (ref 0.0–4.4)
Cholesterol, Total: 175 mg/dL (ref 100–199)
HDL: 62 mg/dL (ref >39)
LDL Chol Calc (NIH): 84 mg/dL (ref 0–99)
Triglycerides: 174 mg/dL — ABNORMAL HIGH (ref 0–149)
VLDL Cholesterol Cal: 29 mg/dL (ref 5–40)

## 2019-06-07 ENCOUNTER — Telehealth: Payer: Self-pay | Admitting: Licensed Clinical Social Worker

## 2019-06-07 NOTE — Telephone Encounter (Signed)
Patient agreed to services and will be added to my schedule for 1/21 @ 1:00.

## 2019-06-08 ENCOUNTER — Ambulatory Visit (HOSPITAL_COMMUNITY)
Admission: RE | Admit: 2019-06-08 | Discharge: 2019-06-08 | Disposition: A | Discharge status: Home / Self Care | Payer: Medicare Other | Source: Ambulatory Visit | Attending: Internal Medicine | Admitting: Internal Medicine

## 2019-06-08 ENCOUNTER — Telehealth: Payer: Self-pay | Admitting: *Deleted

## 2019-06-08 ENCOUNTER — Other Ambulatory Visit: Payer: Self-pay

## 2019-06-08 DIAGNOSIS — M79604 Pain in right leg: Secondary | ICD-10-CM | POA: Insufficient documentation

## 2019-06-08 NOTE — Progress Notes (Signed)
Right lower extremity venous duplex has been completed. Preliminary results can be found in CV Proc through chart review.  Results were given to [facility] at Dr. Doristine Section office.  06/08/19 1:37 PM [name] [name] RVT

## 2019-06-08 NOTE — Telephone Encounter (Signed)
Thanks, attending read confirmed. Normal with no sign of DVT or Baker's cyst.

## 2019-06-08 NOTE — Telephone Encounter (Signed)
Routing to Dr. Rebeca Alert who is clinic attending this afternoon and to Dr. Philipp Ovens who saw the patient with Dr. [name].

## 2019-06-08 NOTE — Telephone Encounter (Signed)
Vascular calls report R venous lower extremity NEGATIVE for DVT

## 2019-06-14 ENCOUNTER — Ambulatory Visit: Payer: Medicare Other | Admitting: Licensed Clinical Social Worker

## 2019-06-14 NOTE — Progress Notes (Signed)
Internal Medicine Clinic Attending  Case discussed with Dr. Coe at the time of the visit.  We reviewed the resident's history and exam and pertinent patient test results.  I agree with the assessment, diagnosis, and plan of care documented in the resident's note.    

## 2019-07-11 ENCOUNTER — Ambulatory Visit (INDEPENDENT_AMBULATORY_CARE_PROVIDER_SITE_OTHER): Payer: Medicare Other | Admitting: Internal Medicine

## 2019-07-11 VITALS — BP 145/84 | HR 78 | Temp 97.6°F | Ht 65.0 in | Wt 142.0 lb

## 2019-07-11 DIAGNOSIS — R109 Unspecified abdominal pain: Secondary | ICD-10-CM | POA: Diagnosis not present

## 2019-07-11 DIAGNOSIS — R1031 Right lower quadrant pain: Secondary | ICD-10-CM

## 2019-07-11 LAB — POCT URINALYSIS DIPSTICK
Bilirubin, UA: NEGATIVE
Glucose, UA: NEGATIVE
Ketones, UA: NEGATIVE
Nitrite, UA: NEGATIVE
Protein, UA: NEGATIVE
Spec Grav, UA: 1.01 (ref 1.010–1.025)
Urobilinogen, UA: 0.2 E.U./dL
pH, UA: 6.5 (ref 5.0–8.0)

## 2019-07-11 NOTE — Assessment & Plan Note (Addendum)
Abdominal discomfort: Ms. Alveta Heimlich has a past medical history significant for diverticulosis, history of rectocele repair in 2003, recent UTI who is presenting for evaluation of recurrent right-sided abdominal pain.  She has been evaluated for intermittent right lower quadrant, right lateral mid abdomen abdominal discomfort in the past and has had negative work-up.  CT abdomen in January 2017 was unremarkable with exception of a 79mm liver lesion, CT abdomen in January 2019 was unremarkable, CT abdomen in September 2020 was also unremarkable.  Today, she states that she began having right-sided abdominal discomfort on Sunday.  Sometimes, the pain is in her epigastric area.  She described the pain as dull, "bad pain.  "She has tried heating pads for the pain with no specific relief.  She states this is the third time since October 2020 that she has had similar pain.  She denies fevers, chills, vomiting, constipation, bright red blood per rectum, hematochezia, dysuria, urinary frequency but does endorse nausea.  She states that she has had decreased p.o. intake since Sunday and has been drinking vitamin water.  In the past, there was concern for recurrent rectocele and she was given a referral to urogynecology.   POC Urine dipstick only revealed small leukocytes   Assessment: Recurrent abdominal pain of unclear etiology with negative prior image findings.  She does have a history of extensive diverticulosis and there could be a possible for atypical diverticular pathology.  Plan: -Follow-up urinalysis, CMP and CBC -Patient advised to try clear liquid diet for now and advance diet as tolerated -Given strict return precautions  ADDENDUM: UA with 1+ leukocytes and few bacteria. She was asymptomatic from a urinary standpoint and will thus not treat with Abx.

## 2019-07-11 NOTE — Patient Instructions (Signed)
[name] [name],   Thanks for seeing [place] today. I am going to get blood work today. I will call you with the labs.   You can try a soft diet for now (Mash potatoes, water, vitamin drink) and slowly increase the consistency as tolerated.   For pain, you can try tylenol.   Take care!

## 2019-07-11 NOTE — Progress Notes (Signed)
   CC: Recurrent abdominal pain  HPI:  [name] is a 86 y.o. community dwelling woman with medical history significant for diverticulosis, history of rectocele repaired in 2003, recent UTI presenting for evaluation of recurrent abdominal pain.  Please see problem based charting for further details  Past Medical History:  Diagnosis Date  . Anemia    iron def  . Anxiety   . Arrhythmia    paroxysmal SVT and symtomatic PAC's  . Diverticulosis    colon  . History of colonic polyps    cecal  . Hypertension   . 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 Osteoporosis 07/21/2011   t score -2.7  . Palpitations 05/24/2017  . Stricture esophagus   . Vitamin D deficiency    2010   Review of Systems:  AS per HPI  Physical Exam:  Vitals:   07/11/19 0954 07/11/19 1001  BP: (!) 166/83 (!) 145/84  Pulse: 82 78  Temp: 97.6 F (36.4 C)   TempSrc: Oral   SpO2: 100%   Weight: 142 lb (64.4 kg)   Height: 5\' 5"  (1.651 m)    Physical Exam  Constitutional: She is well-developed, well-nourished, and in no distress. No distress.  Eyes: Conjunctivae are normal.  Cardiovascular: Normal heart sounds.  No murmur heard. Pulmonary/Chest: Effort normal and breath sounds normal. No respiratory distress. She has no wheezes.  Abdominal: Soft. Normal appearance and bowel sounds are normal. She exhibits no ascites. There is abdominal tenderness (Mild tenderness at RLQ, Suprapubic area). There is no rigidity, no rebound, no guarding, no tenderness at McBurney's point and negative Murphy's sign.  Skin: She is not diaphoretic.  Psychiatric: Mood and affect normal.    Assessment & Plan:   See Encounters Tab for problem based charting.  Patient discussed with Dr. Heber Nezperce

## 2019-07-11 NOTE — Progress Notes (Signed)
Internal Medicine Clinic Attending  Case discussed with Dr. Agyei at the time of the visit.  We reviewed the resident's history and exam and pertinent patient test results.  I agree with the assessment, diagnosis, and plan of care documented in the resident's note.    

## 2019-07-12 LAB — CMP14 + ANION GAP
ALT: 10 IU/L (ref 0–32)
AST: 24 IU/L (ref 0–40)
Albumin/Globulin Ratio: 1.7 (ref 1.2–2.2)
Albumin: 4.5 g/dL (ref 3.6–4.6)
Alkaline Phosphatase: 87 IU/L (ref 39–117)
Anion Gap: 14 mmol/L (ref 10.0–18.0)
BUN/Creatinine Ratio: 20 (ref 12–28)
BUN: 14 mg/dL (ref 8–27)
Bilirubin Total: 0.2 mg/dL (ref 0.0–1.2)
CO2: 25 mmol/L (ref 20–29)
Calcium: 9.7 mg/dL (ref 8.7–10.3)
Chloride: 101 mmol/L (ref 96–106)
Creatinine, Ser: 0.69 mg/dL (ref 0.57–1.00)
GFR calc Af Amer: 91 mL/min/{1.73_m2} (ref >59)
GFR calc non Af Amer: 79 mL/min/{1.73_m2} (ref >59)
Globulin, Total: 2.6 g/dL (ref 1.5–4.5)
Glucose: 105 mg/dL — ABNORMAL HIGH (ref 65–99)
Potassium: 4.7 mmol/L (ref 3.5–5.2)
Sodium: 140 mmol/L (ref 134–144)
Total Protein: 7.1 g/dL (ref 6.0–8.5)

## 2019-07-12 LAB — MICROSCOPIC EXAMINATION
Casts: NONE SEEN /lpf
RBC, Urine: NONE SEEN /hpf (ref 0–2)

## 2019-07-12 LAB — CBC WITH DIFFERENTIAL/PLATELET
Basophils Absolute: 0 10*3/uL (ref 0.0–0.2)
Basos: 1 %
EOS (ABSOLUTE): 0 10*3/uL (ref 0.0–0.4)
Eos: 0 %
Hematocrit: 39.8 % (ref 34.0–46.6)
Hemoglobin: 13.3 g/dL (ref 11.1–15.9)
Immature Grans (Abs): 0 10*3/uL (ref 0.0–0.1)
Immature Granulocytes: 0 %
Lymphocytes Absolute: 1.1 10*3/uL (ref 0.7–3.1)
Lymphs: 21 %
MCH: 31.2 pg (ref 26.6–33.0)
MCHC: 33.4 g/dL (ref 31.5–35.7)
MCV: 93 fL (ref 79–97)
Monocytes Absolute: 0.4 10*3/uL (ref 0.1–0.9)
Monocytes: 7 %
Neutrophils Absolute: 3.9 10*3/uL (ref 1.4–7.0)
Neutrophils: 71 %
Platelets: 308 10*3/uL (ref 150–450)
RBC: 4.26 x10E6/uL (ref 3.77–5.28)
RDW: 12.8 % (ref 11.7–15.4)
WBC: 5.5 10*3/uL (ref 3.4–10.8)

## 2019-07-12 LAB — URINALYSIS, ROUTINE W REFLEX MICROSCOPIC
Bilirubin, UA: NEGATIVE
Glucose, UA: NEGATIVE
Ketones, UA: NEGATIVE
Nitrite, UA: NEGATIVE
Protein,UA: NEGATIVE
RBC, UA: NEGATIVE
Specific Gravity, UA: 1.005 (ref 1.005–1.030)
Urobilinogen, Ur: 0.2 mg/dL (ref 0.2–1.0)
pH, UA: 6.5 (ref 5.0–7.5)

## 2019-07-13 ENCOUNTER — Telehealth: Payer: Self-pay | Admitting: Internal Medicine

## 2019-07-13 NOTE — Telephone Encounter (Signed)
Called [name] [name] to discuss labs. Overall, they are unremarkable and she is actually feeling better.

## 2019-07-16 ENCOUNTER — Telehealth (HOSPITAL_COMMUNITY): Payer: Self-pay

## 2019-07-16 NOTE — Telephone Encounter (Signed)
The above patient or their representative was contacted and gave the following answers to these questions:         Do you have any of the following symptoms?    NO  Fever                    Cough                   Shortness of breath  Do  you have any of the following other symptoms?    muscle pain         vomiting,        diarrhea        rash         weakness        red eye        abdominal pain         bruising          bruising or bleeding              joint pain           severe headache    Have you been in contact with someone who was or has been sick in the past 2 weeks?  NO  Yes                 Unsure                         Unable to assess   Does the person that you were in contact with have any of the following symptoms?   Cough         shortness of breath           muscle pain         vomiting,            diarrhea            rash            weakness           fever            red eye           abdominal pain           bruising  or  bleeding                joint pain                severe headache                 COMMENTS OR ACTION PLAN FOR THIS PATIENT:        ALL QUESTIONS WERE ANSWERED/[facility] PATIENT HAS GOTTEN 1ST SHOT

## 2019-07-16 NOTE — Addendum Note (Signed)
Addended by: Hulan Fray on: 07/16/2019 07:29 PM   Modules accepted: Orders

## 2019-07-17 ENCOUNTER — Other Ambulatory Visit: Payer: Self-pay

## 2019-07-17 ENCOUNTER — Encounter: Payer: Self-pay | Admitting: Vascular Surgery

## 2019-07-17 ENCOUNTER — Ambulatory Visit (INDEPENDENT_AMBULATORY_CARE_PROVIDER_SITE_OTHER): Payer: Medicare Other | Admitting: Vascular Surgery

## 2019-07-17 ENCOUNTER — Ambulatory Visit (HOSPITAL_COMMUNITY)
Admission: RE | Admit: 2019-07-17 | Discharge: 2019-07-17 | Disposition: A | Discharge status: Home / Self Care | Payer: Medicare Other | Source: Ambulatory Visit | Attending: Vascular Surgery | Admitting: Vascular Surgery

## 2019-07-17 VITALS — BP 161/85 | HR 79 | Temp 98.5°F | Resp 18 | Ht 65.0 in | Wt 142.1 lb

## 2019-07-17 DIAGNOSIS — I83893 Varicose veins of bilateral lower extremities with other complications: Secondary | ICD-10-CM | POA: Diagnosis not present

## 2019-07-17 DIAGNOSIS — I781 Nevus, non-neoplastic: Secondary | ICD-10-CM

## 2019-07-17 DIAGNOSIS — M79604 Pain in right leg: Secondary | ICD-10-CM | POA: Diagnosis not present

## 2019-07-17 NOTE — Progress Notes (Signed)
[facility]  Patient name: [name] [name] MRN: [id number] DOB: September 09, 1929 Sex: female  REASON FOR CONSULT: Evaluation discomfort right leg.  HPI: [name] [name] is a 86 y.o. female, who is her today for discussion of discomfort in her right leg.  She reports that she had undergone venous treatment with me 20 years ago and had a durable result.  I do not have access to these records today.  She has marked bilateral lower extremity telangiectasia mainly from her knees distally onto her feet.  She does have a raised area of telangiectasia in the posterior aspect just above her popliteal space on the right.  She reports some mild discomfort with this.  Apparently there had been some discussion regarding potential right knee effusion and drainage of this but she is not interested in proceeding.  She has no history of DVT.  She has mild lower extremity swelling  Past Medical History:  Diagnosis Date  . Anemia    iron def  . Anxiety   . Arrhythmia    paroxysmal SVT and symtomatic PAC's  . Diverticulosis    colon  . History of colonic polyps    cecal  . Hypertension   . 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 Osteoporosis 07/21/2011   t score -2.7  . Palpitations 05/24/2017  . Stricture esophagus   . Vitamin D deficiency    2010    Family History  Problem Relation Age of Onset  . Stroke Father        @ 58  . Hypertension Father   . Hypertension Sister   . Cancer Sister 91       brain  . Breast cancer Other 42       breast  . Breast cancer Daughter 23  . Cancer Daughter 5       breast  . Cancer Other 32       colon/MELANOMA  . Stroke Mother        medication induced  . Kidney disease Brother   . Cancer Brother     SOCIAL HISTORY: Social History   Socioeconomic History  . Marital status: Married    Spouse name: Not on file  . Number of children: Not on  file  . Years of education: Not on file  . Highest education level: Not on file  Occupational History  . Occupation: Retired  Tobacco Use  . Smoking status: Former Smoker    Quit date: 05/24/1968    Years since quitting: 51.1  . Smokeless tobacco: Never Used  . Tobacco comment: only smoked 7 years  Substance and Sexual Activity  . Alcohol use: No    Alcohol/week: 0.0 standard drinks  . Drug use: No  . Sexual activity: Never    Comment: 1st intercourse 67 yo-1 partner  Other Topics Concern  . Not on file  Social History Narrative  . Not on file   Social Determinants of Health   Financial Resource Strain:   . Difficulty of Paying Living Expenses: Not on file  Food Insecurity:   . Worried About Charity fundraiser in the Last Year: Not on file  . Ran Out of Food in the Last Year: Not on file  Transportation Needs:   . Lack of Transportation (Medical): Not on file  . Lack of Transportation (Non-Medical): Not on file  Physical Activity:   . Days of  Exercise per Week: Not on file  . Minutes of Exercise per Session: Not on file  Stress:   . Feeling of Stress : Not on file  Social Connections:   . Frequency of Communication with Friends and Family: Not on file  . Frequency of Social Gatherings with Friends and Family: Not on file  . Attends Religious Services: Not on file  . Active Member of Clubs or Organizations: Not on file  . Attends Archivist Meetings: Not on file  . Marital Status: Not on file  Intimate Partner Violence:   . Fear of Current or Ex-Partner: Not on file  . Emotionally Abused: Not on file  . Physically Abused: Not on file  . Sexually Abused: Not on file    Allergies  Allergen Reactions  . Other Anaphylaxis    Reaction to GI Cocktail  . Pantoprazole Other (See Comments)    Upset stomach and weight loss  . Zyrtec [Cetirizine] Palpitations  . Ciprofloxacin     Rash with burning all over body    Current Outpatient Medications  Medication  Sig Dispense Refill  . aspirin EC 325 MG tablet Take 325 mg by mouth daily as needed (headache).    [employer] Cod Liver Oil CAPS Take 1 capsule by mouth daily.    . flecainide (TAMBOCOR) 50 MG tablet Take 1-2 tablets (50-100 mg total) by mouth See admin instructions. Take 2 tablets (200 mg) by mouth every morning and 1 tablet (50 mg) at night 270 tablet 2  . loratadine (CLARITIN) 10 MG tablet Take 1 tablet (10 mg total) by mouth daily. (Patient not taking: Reported on 07/17/2019) 30 tablet 2   No current facility-administered medications for this visit.    REVIEW OF SYSTEMS:  [X]  denotes positive finding, [ ]  denotes negative finding Cardiac  Comments:  Chest pain or chest pressure:    Shortness of breath upon exertion:    Short of breath when lying flat:    Irregular heart rhythm:        Vascular    Pain in calf, thigh, or hip brought on by ambulation:    Pain in feet at night that wakes you up from your sleep:     Blood clot in your veins:    Leg swelling:         Pulmonary    Oxygen at home:    Productive cough:     Wheezing:         Neurologic    Sudden weakness in arms or legs:     Sudden numbness in arms or legs:     Sudden onset of difficulty speaking or slurred speech:    Temporary loss of vision in one eye:     Problems with dizziness:         Gastrointestinal    Blood in stool:     Vomited blood:         Genitourinary    Burning when urinating:     Blood in urine:        Psychiatric    Major depression:         Hematologic    Bleeding problems:    Problems with blood clotting too easily:        Skin    Rashes or ulcers:        Constitutional    Fever or chills:      PHYSICAL EXAM: Vitals:   07/17/19 1316  BP: (!) 161/85  Pulse: 79  Resp: 18  Temp: 98.5 F (36.9 C)  TempSrc: Temporal  SpO2: 97%  Weight: 142 lb 1.6 oz (64.5 kg)  Height: 5\' 5"  (1.651 m)    GENERAL: The patient is a well-nourished female, in no acute distress. The vital signs are  documented above. CARDIOVASCULAR: Palpable dorsalis pedis pulses bilaterally.  Marked telangiectasia over both lower extremities.  These are somewhat raised and plexus above her right popliteal space. PULMONARY: There is good air exchange  ABDOMEN: Soft and non-tender  MUSCULOSKELETAL: There are no major deformities or cyanosis. NEUROLOGIC: No focal weakness or paresthesias are detected. SKIN: There are no ulcers or rashes noted. PSYCHIATRIC: The patient has a normal affect.  DATA:  No evidence of DVT.  She does have a deep venous reflux.  No evidence of saphenous vein reflux  MEDICAL ISSUES: I discussed these findings with the patient.  She does report that she wraps her knee occasionally but does not feel that this is giving her any improvement in symptoms.  She is not having any major swelling.  I have encouraged her to continue her proper her walking program which she is asking about.  I also explained that I do not feel that there would be any indication for sclerotherapy for the small area behind her knee.  I do not feel that this would give her any symptom relief and she is not interested in cosmetic treatment.  She was reassured with this discussion will see [place] again on an as-needed basis   Rosetta Posner, MD [facility] Tel [phone] Pager [phone]

## 2019-07-27 ENCOUNTER — Other Ambulatory Visit: Payer: Self-pay

## 2019-07-27 ENCOUNTER — Encounter (HOSPITAL_COMMUNITY): Payer: Self-pay

## 2019-07-27 ENCOUNTER — Ambulatory Visit (HOSPITAL_COMMUNITY)
Admission: EM | Admit: 2019-07-27 | Discharge: 2019-07-27 | Disposition: A | Discharge status: Home / Self Care | Payer: Medicare Other | Attending: Family Medicine | Admitting: Family Medicine

## 2019-07-27 DIAGNOSIS — F4321 Adjustment disorder with depressed mood: Secondary | ICD-10-CM | POA: Diagnosis not present

## 2019-07-27 DIAGNOSIS — Z9109 Other allergy status, other than to drugs and biological substances: Secondary | ICD-10-CM

## 2019-07-27 MED ORDER — FEXOFENADINE HCL 180 MG PO TABS: 180.0000 mg | ORAL_TABLET | Freq: Every day | ORAL | 0 refills | Status: DC

## 2019-07-27 MED ORDER — LORAZEPAM 0.5 MG PO TABS: ORAL_TABLET | ORAL | 0 refills | Status: DC

## 2019-07-27 NOTE — Discharge Instructions (Addendum)
Continue to drink plenty of water Use salt water gargles Use saline nasal spray Take the fexofenadine daily when needed Use lorazepam sparingly Get refills from your primary care

## 2019-07-27 NOTE — ED Triage Notes (Signed)
Pt presents with chronic allergies; pt states she takes a antihistamine OTC but it does not help much.  Pt had first covid vaccine at the end of February.

## 2019-07-27 NOTE — ED Provider Notes (Signed)
[facility]    CSN: [id number] Arrival date & time: 07/27/19  1241      History   Chief Complaint Chief Complaint  Patient presents with  . Appointment  . (1 pm Allergies)    HPI [name] [name] is a 86 y.o. female.   HPI  Patient states that she was on fexofenadine 180 mg daily for her allergies for years.  This worked very well for her.  When she went to her primary care doctor to have this refilled he told her to try Claritin 10 mg.  He gave her a 30-day supply.  She took it 1 a day for 30 days.  She still has runny and stuffy nose, itchy watery eyes, postnasal drip, hoarse voice.  She feels some mucus collecting in the back of her throat.  She does not feel the Claritin works as well for her as the fexofenadine did.  She would like to have her fexofenadine refilled. She lost her husband 3 months ago.  She states that she previously took lorazepam when her daughter died.  She states she needed it for a month and then did not take it anymore.  Since her husband died she has been taking his clonazepam prescription as needed.  She states that she ran out.  She states she would like to have a few lorazepam to take as needed.  She needs it especially at night.  She is still tearful and upset.  They were married for 70 years.   Past Medical History:  Diagnosis Date  . Anemia    iron def  . Anxiety   . Arrhythmia    paroxysmal SVT and symtomatic PAC's  . Diverticulosis    colon  . History of colonic polyps    cecal  . Hypertension   . 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 Osteoporosis 07/21/2011   t score -2.7  . Palpitations 05/24/2017  . Stricture esophagus   . Vitamin D deficiency    2010    Patient Active Problem List   Diagnosis Date Noted  . Varicose veins of both legs with edema 06/04/2019  . Hyperlipidemia 06/04/2019  . History of repair of rectocele 03/13/2019  . Allergic reaction caused by a drug  03/13/2019  . Elevated troponin   . Rotator cuff arthropathy, right 04/08/2016  . Arthritis of knee 03/25/2016  . Presbycusis of both ears 01/15/2016  . Tinnitus of right ear 01/15/2016  . Skin cancer of arm 09/26/2014  . Palpitation 09/28/2012  . Rhinitis 12/25/2011  . History of TIA (transient ischemic attack) 07/19/2011  . ATRIAL PREMATURE BEATS 03/20/2009  . ANEMIA-IRON DEFICIENCY 04/02/2008  . Anxiety 04/02/2008  . Essential hypertension 04/02/2008  . GERD 04/02/2008  . OSTEOPOROSIS 04/02/2008    Past Surgical History:  Procedure Laterality Date  . A&P, enterocele repair     s/p 2003  . ABDOMINAL HYSTERECTOMY    . BREAST REDUCTION SURGERY     s/p 1996      PATIENT DENIES BREAST REDUCTION   . HEMORRHOID SURGERY    . LEFT HEART CATH AND CORONARY ANGIOGRAPHY N/A 05/25/2017   Procedure: LEFT HEART CATH AND CORONARY ANGIOGRAPHY;  Surgeon: Jettie Booze, MD;  Location: [facility];  Service: Cardiovascular;  Laterality: N/A;    OB History    Gravida  2   Para  2   Term      Preterm  AB      Living  1     SAB      TAB      Ectopic      Multiple      Live Births               Home Medications    Prior to Admission medications   Medication Sig Start Date End Date Taking? Authorizing Provider  aspirin EC 325 MG tablet Take 325 mg by mouth daily as needed (headache).    [provider]  [employer] Liver Oil CAPS Take 1 capsule by mouth daily.    [provider]  fexofenadine (ALLEGRA) 180 MG tablet Take 1 tablet (180 mg total) by mouth daily. 07/27/19   Raylene Everts, MD  flecainide (TAMBOCOR) 50 MG tablet Take 1-2 tablets (50-100 mg total) by mouth See admin instructions. Take 2 tablets (200 mg) by mouth every morning and 1 tablet (50 mg) at night 05/07/19   Evans Lance, MD  LORazepam (ATIVAN) 0.5 MG tablet Daily as needed 07/27/19   Raylene Everts, MD  loratadine (CLARITIN) 10 MG tablet Take 1 tablet (10 mg total) by  mouth daily. Patient not taking: Reported on 07/17/2019 06/04/19 07/27/19  [name], MD    Family History Family History  Problem Relation Age of Onset  . Stroke Father        @ 59  . Hypertension Father   . Hypertension Sister   . Cancer Sister 63       brain  . Breast cancer Other 42       breast  . Breast cancer Daughter 101  . Cancer Daughter 41       breast  . Cancer Other 73       colon/MELANOMA  . Stroke Mother        medication induced  . Kidney disease Brother   . Cancer Brother     Social History Social History   Tobacco Use  . Smoking status: Former Smoker    Quit date: 05/24/1968    Years since quitting: 51.2  . Smokeless tobacco: Never Used  . Tobacco comment: only smoked 7 years  Substance Use Topics  . Alcohol use: No    Alcohol/week: 0.0 standard drinks  . Drug use: No     Allergies   Other, Pantoprazole, Zyrtec [cetirizine], and Ciprofloxacin   Review of Systems Review of Systems  Constitutional: Positive for appetite change and unexpected weight change.       Weight loss  HENT: Positive for congestion, postnasal drip, rhinorrhea and voice change.   Respiratory: Negative for cough and shortness of breath.   Cardiovascular: Negative for chest pain and palpitations.  Psychiatric/Behavioral: Positive for sleep disturbance. The patient is nervous/anxious.        Grief     Physical Exam Triage Vital Signs ED Triage Vitals  Enc Vitals Group     BP 07/27/19 1303 (!) 124/93     Pulse Rate 07/27/19 1303 70     Resp 07/27/19 1303 17     Temp 07/27/19 1303 97.6 F (36.4 C)     Temp Source 07/27/19 1303 Oral     SpO2 07/27/19 1303 99 %     Weight --      Height --      Head Circumference --      Peak Flow --      Pain Score 07/27/19 1301 1     Pain Loc --  Pain Edu? --      Excl. in [place]? --    No data found.  Updated Vital Signs BP (!) 124/93 (BP Location: Right Arm)   Pulse 70   Temp 97.6 F (36.4 C) (Oral)   Resp 17   LMP  05/24/1970   SpO2 99%     Physical Exam Constitutional:      General: She is not in acute distress.    Appearance: She is well-developed and normal weight.  HENT:     Head: Normocephalic and atraumatic.     Nose: Nose normal. No congestion.     Mouth/Throat:     Mouth: Mucous membranes are moist.     Pharynx: No oropharyngeal exudate.     Comments: Thick white PND Eyes:     Pupils: Pupils are equal, round, and reactive to light.     Comments: Mild blepharitis, lower lids injected  Cardiovascular:     Rate and Rhythm: Normal rate and regular rhythm.     Heart sounds: Normal heart sounds.  Pulmonary:     Effort: Pulmonary effort is normal. No respiratory distress.     Breath sounds: Normal breath sounds.  Musculoskeletal:        General: Normal range of motion.     Cervical back: Normal range of motion.  Skin:    General: Skin is warm and dry.  Neurological:     Mental Status: She is alert.  Psychiatric:        Mood and Affect: Mood normal.        Behavior: Behavior normal.      UC Treatments / Results  Labs (all labs ordered are listed, but only abnormal results are displayed) Labs Reviewed - No data to display  EKG   Radiology No results found.  Procedures Procedures (including critical care time)  Medications Ordered in UC Medications - No data to display  Initial Impression / Assessment and Plan / UC Course  I have reviewed the triage vital signs and the nursing notes.  Pertinent labs & imaging results that were available during my care of the patient were reviewed by me and considered in my medical decision making (see chart for details).     Patient states that her pharmacist recommended that she take Allegra-D instead of the plain fexofenadine.  I let her know that I would not put her on a decongestant given her history of palpitations.  We will going to put her back on her fexofenadine even though it is not elevated dose.  She has tolerated well for  years. I told her I can give her a limited number of loratadine.  This really needs to be taken care of by her primary care physician. Final Clinical Impressions(s) / UC Diagnoses   Final diagnoses:  Environmental allergies  Grief reaction     Discharge Instructions     Continue to drink plenty of water Use salt water gargles Use saline nasal spray Take the fexofenadine daily when needed Use lorazepam sparingly Get refills from your primary care    ED Prescriptions    Medication Sig Dispense Auth. Provider   LORazepam (ATIVAN) 0.5 MG tablet Daily as needed 30 tablet Raylene Everts, MD   fexofenadine (ALLEGRA) 180 MG tablet Take 1 tablet (180 mg total) by mouth daily. 90 tablet Raylene Everts, MD     I have reviewed the PDMP during this encounter.   Raylene Everts, MD 07/27/19 [phone]

## 2019-08-01 NOTE — Progress Notes (Signed)
Cardiology Office Note Date:  08/02/2019  Patient ID:  [name] [name], DOB [birth year], MRN [id number] PCP:  [name], MD Electrophysiologist: Dr. Lovena Le                                                                                                      Chief Complaint:  palpitations  History of Present Illness: [name] [name] is a 86 y.o. female with history of palpitations (PSVT is mentioned in her notes, though Dr. Tanna Furry note state no sustained arrhythmias and APC's treated with Flecainide), HTN.    Historically had been maintained on flecainide for years for symptomatic PACs/PVCs though during a hospital stay CP stopped and started on metoprolol that she was very intolerant of,  and Rx Rythmol but never started. At an office visit with myself in November 2017, noted with PVCs that she was feeling/very bothersome to her, and given negative stress test resumed on her flecainide.    She was seen in f/u by Dr. Lovena Le in 28-May-2016 doing better with minimal palpitations, discussed adding metoprolol if more palpitations.  She was seen by myself in June 2018, felt like her BP has been high, getting headaches lately.  She had minimal palpitatins that were not bothersome to her, no CP, no SOB.  She denied dizziness, near syncope or syncope.  She was seeing ENT for tinittus and hearing loss just fitted for hearing aids.  Low dose metoprolol was added for BP.  Though lower dose was never started  She saw Dr. Lovena Le in Sept for f/u, she was concerned/convinced she had drank a bottle of contaminated water and was having GI issues, her BP was elevated though thought a component of anxiety, doesn't look like she started the metoprolol, advised to minimize sodium, no symptoms of palpitations.  She was admitted to [facility] with c/o palpitations, noted to have elevated Trop, cath was done without CAD without clear reason for her trop, discharged 05/26/17 to f/u with EP for symptomatic PVCs  Sh has had some  subsequent non-cardiac related ER visits over the last year, she last saw De. Lovena Le 2019/05/29, her husband had died just 3 weeks prior. Continued her Flecainide, no changes were made.  Mentioned her atypical CP was well controlled.  She saw her PMD in feb with some chronic sounding abdominal discomfort, recommended clear liquid diet for a few days, with prior negative imaging recommended labs, UA planned. She saw Dr. Donnetta Hutching in Feb it seems for some LE pain, not found with evidence of DVT, did not recommend sclerotherapy for a small area behind her knee.  Recommended walking program for her knee pain She was seen at an [facility] 07/27/2019 with a couple of complaints, requested Rx for her environmental allergies, though also asked for Rx for lorazepam, in discussion of her recent loss of her husband (described as tearful)  She comes today with a few complaints.  Primarily GI, generalized abdominal discomfort, nausea, poor appetite ongoing for some months.  She has seen her PMD for this.  She mentions that she is also suffering  with post nasal gtt, hoarse voice, and a funny feeling that seems to come up into her throat from the middle of her chest, makes her feel nauseous.she has been drinking Ensures every day, poor intake otherwise.  She tried to eat better yesterday but seemed to make her heart skip more then usual  No SOB, no CP, no near syncope or syncope.  She generally finds her BP at home 110/60's, intermittently spikes higher  She has been having difficulty d=with her grieving process and adjusting to life without her husband.  Though of late has been out in the yard getting some gardening going, more active and states she really felt good physically and even emotionally.  She is looking forward to the better weather/climate to be outside.  Past Medical History:  Diagnosis Date  . Anemia    iron def  . Anxiety   . Arrhythmia    paroxysmal SVT and symtomatic PAC's  . Diverticulosis    colon  .  History of colonic polyps    cecal  . Hypertension   . 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 Osteoporosis 07/21/2011   t score -2.7  . Palpitations 05/24/2017  . Stricture esophagus   . Vitamin D deficiency    2010    Past Surgical History:  Procedure Laterality Date  . A&P, enterocele repair     s/p 2003  . ABDOMINAL HYSTERECTOMY    . BREAST REDUCTION SURGERY     s/p 1996      PATIENT DENIES BREAST REDUCTION   . HEMORRHOID SURGERY    . LEFT HEART CATH AND CORONARY ANGIOGRAPHY N/A 05/25/2017   Procedure: LEFT HEART CATH AND CORONARY ANGIOGRAPHY;  Surgeon: Jettie Booze, MD;  Location: [facility];  Service: Cardiovascular;  Laterality: N/A;    Current Outpatient Medications  Medication Sig Dispense Refill  . aspirin EC 325 MG tablet Take 325 mg by mouth daily as needed (headache).    Marland Kitchen Cod Liver Oil CAPS Take 1 capsule by mouth daily.    . fexofenadine (ALLEGRA) 180 MG tablet Take 1 tablet (180 mg total) by mouth daily. 90 tablet 0  . flecainide (TAMBOCOR) 50 MG tablet Take 1 tablet (50 mg total) by mouth See admin instructions. Take 2 tablets (100 mg) by mouth every morning and 1 tablet (50 mg) at night 270 tablet 1  . LORazepam (ATIVAN) 0.5 MG tablet Daily as needed 30 tablet 0   No current facility-administered medications for this visit.    Allergies:   Other, Pantoprazole, Zyrtec [cetirizine], and Ciprofloxacin   Social History:  The patient  reports that she quit smoking about 51 years ago. She has never used smokeless tobacco. She reports that she does not drink alcohol or use drugs.   Family History:  The patient's family history includes Breast cancer (age of onset: 32) in an other family member; Breast cancer (age of onset: 42) in her daughter; Cancer in her brother; Cancer (age of onset: 58) in an other family member; Cancer (age of onset: 67) in her daughter; Cancer (age of onset: 76) in her  sister; Hypertension in her father and sister; Kidney disease in her brother; Stroke in her father and mother.  ROS:  Please see the history of present illness.  All other systems are reviewed and otherwise negative.   PHYSICAL EXAM:  VS:  BP (!) 160/72   Pulse 78   Ht  5\' 5"  (1.651 m)   Wt 141 lb (64 kg)   LMP 05/24/1970   BMI 23.46 kg/m  BMI: Body mass index is 23.46 kg/m. Well nourished, well developed, in no acute distress  HEENT: normocephalic, atraumatic  Neck: no JVD, carotid bruits or masses Cardiac: RRR; 1/6 SM, no extrasystoles, no rubs, or gallops Lungs:  CTA b/l, no wheezing, rhonchi or rales  Abd: soft, nontender MS: no deformity or atrophy Ext:  no edema  Skin: warm and dry Neuro:  No gross deficits appreciated Psych: euthymic mood, full affect    EKG:  Done today and reviewed by myself  08/02/19: SR 78, borderline 1st degree AVNlock, PR 2109ms, QRS 7ms, QTc455ms, no ST/T changes 05/24/17 SR 69bpm, measured PR 274ms, QRS 65ms, QTc 476ms 10/28/16: SR, 76bpm, borderline 1st degree AVBlock, PR 241ms, QRS 16ms, measured QT/QTc 369ms/405ms, not significantly changed from prior    05/25/17: LHC  Mid LAD lesion is 25% stenosed.  The left ventricular systolic function is normal.  LV end diastolic pressure is low.  The left ventricular ejection fraction is 55-65% by visual estimate.  There is no aortic valve stenosis. Nonobstructive coronary artery disease in the LAD.    05/24/17: TTE  Study Conclusions - Left ventricle: The cavity size was normal. Systolic function was   vigorous. The estimated ejection fraction was in the range of 65%   to 70%. Wall motion was normal; there were no regional wall   motion abnormalities. Doppler parameters are consistent with   abnormal left ventricular relaxation (grade 1 diastolic   dysfunction). There was no evidence of elevated ventricular   filling pressure by Doppler parameters. - Aortic valve: There was no regurgitation. -  Aortic root: The aortic root was normal in size. - Mitral valve: There was mild regurgitation. - Right ventricle: The cavity size was normal. Wall thickness was   normal. Systolic function was normal. - Right atrium: The atrium was normal in size. - Tricuspid valve: There was trivial regurgitation. - Pulmonary arteries: Systolic pressure was within the normal   range. - Inferior vena cava: The vessel was normal in size. - Pericardium, extracardiac: There was no pericardial effusion.    Recent Labs: 01/28/2019: B Natriuretic Peptide 52.6; Magnesium 2.4 07/11/2019: ALT 10; BUN 14; Creatinine, Ser 0.69; Hemoglobin 13.3; Platelets 308; Potassium 4.7; Sodium 140  06/04/2019: Chol/HDL Ratio 2.8; Cholesterol, Total 175; HDL 62; LDL Chol Calc (NIH) 84; Triglycerides 174   CrCl cannot be calculated (Patient's most recent lab result is older than the maximum 21 days allowed.).   Wt Readings from Last 3 Encounters:  08/02/19 141 lb (64 kg)  07/17/19 142 lb 1.6 oz (64.5 kg)  07/11/19 142 lb (64.4 kg)     Other studies reviewed: Additional studies/records reviewed today include: summarized above  ASSESSMENT AND PLAN:  1. Palpitations, PVCs, hx of PACs     On flecainide, maintained off metoprolol     Seems like her palpitations are well controlled  2. HTN     Fairly labile, attempts to use metoprolol/BP meds have failed, she reports her BP "plummets"  3. Symptoms as discussed above     She is encouraged to revisit with her PMD     Discussed the importance of adequate calori/PO intake     She has NOD by cath only 2 years ago, no EKGs of any concern today, I do not think any of her symptoms are cardiac.coronary     I also encouraged her to discuss with  her PMD perhaps some component of depression, though she is looking for [place] to summer and getting back to her yard which is encouraging.    Disposition: see her back in 4mo, sooner if needed       Haywood Lasso, PA-C 08/02/2019 1:24  PM     [place] [place] [address] [phone] (office)  [phone] (fax)

## 2019-08-02 ENCOUNTER — Ambulatory Visit (INDEPENDENT_AMBULATORY_CARE_PROVIDER_SITE_OTHER): Payer: Medicare Other | Admitting: Physician Assistant

## 2019-08-02 ENCOUNTER — Other Ambulatory Visit: Payer: Self-pay

## 2019-08-02 VITALS — BP 160/72 | HR 78 | Ht 65.0 in | Wt 141.0 lb

## 2019-08-02 DIAGNOSIS — I493 Ventricular premature depolarization: Secondary | ICD-10-CM

## 2019-08-02 DIAGNOSIS — R0989 Other specified symptoms and signs involving the circulatory and respiratory systems: Secondary | ICD-10-CM | POA: Diagnosis not present

## 2019-08-02 DIAGNOSIS — R002 Palpitations: Secondary | ICD-10-CM | POA: Diagnosis not present

## 2019-08-02 DIAGNOSIS — I491 Atrial premature depolarization: Secondary | ICD-10-CM

## 2019-08-02 MED ORDER — FLECAINIDE ACETATE 50 MG PO TABS: 50.0000 mg | ORAL_TABLET | ORAL | 1 refills | Status: DC

## 2019-08-02 NOTE — Patient Instructions (Signed)
Medication Instructions:   Your physician recommends that you continue on your current medications as directed. Please refer to the Current Medication list given to you today.   *If you need a refill on your cardiac medications before your next appointment, please call your pharmacy*   Lab Work: [place]   If you have labs (blood work) drawn today and your tests are completely normal, you will receive your results only by: Marland Kitchen MyChart Message (if you have MyChart) OR . A paper copy in the mail If you have any lab test that is abnormal or we need to change your treatment, we will call you to review the results.   Testing/Procedures: NONE ORDERED  TODAY     Follow-Up: At [facility], you and your health needs are our priority.  As part of our continuing mission to provide you with exceptional heart care, we have created designated Provider Care Teams.  These Care Teams include your primary Cardiologist (physician) and Advanced Practice Providers (APPs -  Physician Assistants and Nurse Practitioners) who all work together to provide you with the care you need, when you need it.  We recommend signing up for the patient portal called "MyChart".  Sign up information is provided on this After Visit Summary.  MyChart is used to connect with patients for Virtual Visits (Telemedicine).  Patients are able to view lab/test results, encounter notes, upcoming appointments, etc.  Non-urgent messages can be sent to your provider as well.   To learn more about what you can do with MyChart, go to [email or web address].    Your next appointment:   6 month(s)  The format for your next appointment:   In Person  Provider:   You may see  or one of the following Advanced Practice Providers on your designated Care Team:    Chanetta Marshall, NP  Tommye Standard, PA-C  Legrand Como "Oda Kilts, [place]    Other Instructions

## 2019-08-13 ENCOUNTER — Ambulatory Visit (INDEPENDENT_AMBULATORY_CARE_PROVIDER_SITE_OTHER): Payer: Medicare Other | Admitting: Internal Medicine

## 2019-08-13 ENCOUNTER — Other Ambulatory Visit: Payer: Self-pay

## 2019-08-13 DIAGNOSIS — J3089 Other allergic rhinitis: Secondary | ICD-10-CM

## 2019-08-13 DIAGNOSIS — J069 Acute upper respiratory infection, unspecified: Secondary | ICD-10-CM | POA: Diagnosis not present

## 2019-08-13 DIAGNOSIS — J029 Acute pharyngitis, unspecified: Secondary | ICD-10-CM | POA: Diagnosis not present

## 2019-08-13 MED ORDER — FLUTICASONE PROPIONATE 50 MCG/ACT NA SUSP: 1.0000 | Freq: Every day | NASAL | 2 refills | Status: DC

## 2019-08-13 NOTE — Progress Notes (Signed)
Internal Medicine Clinic Attending  Case discussed with Dr. Melvin at the time of the visit.  We reviewed the resident's history and exam and pertinent patient test results.  I agree with the assessment, diagnosis, and plan of care documented in the resident's note.  Sahaana Weitman, M.D., Ph.D.  

## 2019-08-13 NOTE — Assessment & Plan Note (Addendum)
Patient has been taking antihistamines long term for allergies and post-nasal drip. She was started on Claritin, which was ineffective for her. ED started her on allegra (she had taken this previously). She has had some diiffculty swallowing this (which she states happen for her with larger pills), which has been worse with her sore throat the last few days. Would like to switch her to Zyrtec as this is a smaller tablet, but she has reaction of palpitations listed. Will continue with allegra for now and have her break them in half. Will also add Flonase for further allergie and PND relief. - Continue allegra - Start Fluticasone

## 2019-08-13 NOTE — Progress Notes (Signed)
  This is a telephone encounter between [facility] and [name] [name] on 08/13/2019. The visit was conducted with the patient located at home and [name] [name] at [facility]. The patient's identity was confirmed using their DOB and current address. The patient has consented to being evaluated through a telephone encounter and understands the associated risks (an examination cannot be done and the patient may need to come in for an appointment) / benefits (allows the patient to remain at home, decreasing exposure to coronavirus). I personally spent 23 minutes on medical discussion.  CC: Sore Throat, Allergies  HPI:  [name] is a 86 y.o. F with PMHx listed below presenting for Sore throat, Allergies. Please see the A&P for the status of the patient's chronic medical problems.  Past Medical History:  Diagnosis Date  . Anemia    iron def  . Anxiety   . Arrhythmia    paroxysmal SVT and symtomatic PAC's  . Diverticulosis    colon  . History of colonic polyps    cecal  . Hypertension   . 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 Osteoporosis 07/21/2011   t score -2.7  . Palpitations 05/24/2017  . Stricture esophagus   . Vitamin D deficiency    2010   Review of Systems:  Performed and all others negative.  Physical Exam:  There were no vitals filed for this visit. Not performed for tele-health visit  Assessment & Plan:   See Encounters Tab for problem based charting.  Patient discussed with Dr. Rebeca Alert

## 2019-08-13 NOTE — Assessment & Plan Note (Addendum)
Patient reports sore throat for the past several days. She has had some throat related issues for a couple of months now. She reports a nonspecific lesion in her mouth that was noted at a dentists office in Feb (unclear what time of lesion). She states this was persistent or again present when she was seen by urgent care earlier this month. "Thick white PND" was noted at that visit, but no lesions were described. She had her allergy medicine changed from claritin to allegra at that visit and was provided some medicaiton for anxiety.  She reports she has had increased throat issues and soreness since Saturday, which has made it difficult to swallow food and some of her pills. She has some trouble swallowing pills at baseline and state she has a small throat. The difficulty swallowing food is concerning for risk of malnutrition. She states she his able to swallow liquids and is drinking supplemental shake and trying to eat as best she can.  We will treat supportively for viral URI. She will continue nasal rinses and throat lozenges. We will continue Allegra and add Flonase to her regimen. She is to follow in person in 3-4 days to evaluate effectiveness of this therapy and to look closer at these lesions she is describing. We will also need to consider further work up of her swallowing difficulty if this persists. - Nasal rinses, Lozenges, Allegra, Flonase - Follow up in 3-4 days in person

## 2019-08-17 ENCOUNTER — Ambulatory Visit (INDEPENDENT_AMBULATORY_CARE_PROVIDER_SITE_OTHER): Payer: Medicare Other | Admitting: Internal Medicine

## 2019-08-17 ENCOUNTER — Other Ambulatory Visit: Payer: Self-pay

## 2019-08-17 ENCOUNTER — Encounter: Payer: Self-pay | Admitting: Internal Medicine

## 2019-08-17 VITALS — BP 148/73 | HR 72 | Temp 97.5°F | Ht 65.0 in | Wt 142.4 lb

## 2019-08-17 DIAGNOSIS — J3089 Other allergic rhinitis: Secondary | ICD-10-CM | POA: Diagnosis not present

## 2019-08-17 DIAGNOSIS — R198 Other specified symptoms and signs involving the digestive system and abdomen: Secondary | ICD-10-CM | POA: Insufficient documentation

## 2019-08-17 DIAGNOSIS — F419 Anxiety disorder, unspecified: Secondary | ICD-10-CM | POA: Diagnosis not present

## 2019-08-17 DIAGNOSIS — J029 Acute pharyngitis, unspecified: Secondary | ICD-10-CM | POA: Diagnosis not present

## 2019-08-17 NOTE — Assessment & Plan Note (Signed)
Patient contiues to have anxiety. She had been following with out couselor, burt had trouble making apointments for a time as she was trying to schedule her COVID-19 vaccines as well. She has received both vaccines now and plans to give our counselor a call or stop by the front desk to set up another appointment.  Of note, she mentions an intermittent odd/vague feeling in her neck. She was evaluated by cardiology who stated it was not cardiac. She has a completely normal external exam. This could be exacerbated by her anxiety or could be related to her swallowing difficulty (work up just getting started). - Follow up with couselor

## 2019-08-17 NOTE — Patient Instructions (Signed)
Thank you for allowing [place] to care for you  For your difficulty swallowing - Referral place to see a GI doctor to evaluate for throat narrowing  For your moth lesion, these are improving, not treatment needed  For your Anxiety - Follow up with our counselor as planned

## 2019-08-17 NOTE — Assessment & Plan Note (Addendum)
Patient states she stopped taking the allergy medication that was difficult to swallow and her pain improved. It appears her pain was most likely due to mechanical injury from larger tablets and the rough edges of tablets she had split. Oral exam shows only healing aphthous ulcers and possible mild erythema of pharynx.   She reports she has had difficulty swallowing pills for some time and attributes this to having a small throat. She states she believe some of her siblings had a similar issue. This has not been a lifelong problem and appears to have been getting worse recently as demonstrated by her difficulty with swallowing allegra, which is not particularly large.  She has been able to eat softer foods and drink without issue after her pain improved. She is also drinking boost shakes to supplement her nutrition. Given the progressive nature of her symptoms she likely has an anatomic abnormality that is causing narrowing. Will send her to GI for further evaluation.  Of note, she mentions an intermittent odd/vague feeling in her neck. She was evaluated by cardiology who stated it was not cardiac. She has a completely normal external exam. This could be exacerbated by her anxiety or could be related to her swallowing difficulty. - GI referral

## 2019-08-17 NOTE — Assessment & Plan Note (Signed)
Patient states she stopped taking the allergy medication that was difficult to swallow and her pain improved. It appears her pain was most likely due to mechanical injury from larger tablets and the rough edges of tablets she had split. Oral exam shows only healing aphthous ulcers and possible mild erythema of pharynx.   She reports she has had difficulty swallowing pills for some time and attributes this to having a small throat. She states she believe some of her siblings had a similar issue. This has not been a lifelong problem and appears to have been getting worse recently as demonstrated by her difficulty with swallowing allegra, which is not particularly large.  She has been able to eat softer foods and drink without issue after her pain improved. She is also drinking boost shakes to supplement her nutrition. Given the progressive nature of her symptoms she likely has an anatomic abnormality that is causing narrowing. Will send her to GI for further evaluation. - GI referral

## 2019-08-17 NOTE — Progress Notes (Signed)
   CC: Sore throat, Difficulty Swallowing, Anxiety  HPI:  [name] [name] is a 86 y.o. F with PMHx listed below presenting for Sore throat, Difficulty Swallowing, Anxiety. Please see the A&P for the status of the patient's chronic medical problems.  Past Medical History:  Diagnosis Date  . Anemia    iron def  . Anxiety   . Arrhythmia    paroxysmal SVT and symtomatic PAC's  . Diverticulosis    colon  . Elevated troponin   . History of colonic polyps    cecal  . Hypertension   . 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 Osteoporosis 07/21/2011   t score -2.7  . Palpitations 05/24/2017  . Stricture esophagus   . Vitamin D deficiency    2010   Review of Systems:  Performed and all others negative.  Physical Exam:  Vitals:   08/17/19 0952  BP: (!) 148/73  Pulse: 72  Temp: (!) 97.5 F (36.4 C)  TempSrc: Oral  SpO2: 100%  Weight: 142 lb 6.4 oz (64.6 kg)  Height: 5\' 5"  (1.651 m)   Physical Exam Constitutional:      General: She is not in acute distress.    Appearance: Normal appearance.  HENT:     Mouth/Throat:     Mouth: Mucous membranes are moist. Oral lesions (Healing aphthous ulcers at locations indicated) present.     Pharynx: Oropharynx is clear. Uvula midline. Posterior oropharyngeal erythema (Possible mild) present. No pharyngeal swelling or oropharyngeal exudate.   Cardiovascular:     Rate and Rhythm: Normal rate and regular rhythm.     Pulses: Normal pulses.     Heart sounds: Normal heart sounds.  Pulmonary:     Effort: Pulmonary effort is normal. No respiratory distress.     Breath sounds: Normal breath sounds.  Abdominal:     General: Bowel sounds are normal. There is no distension.     Palpations: Abdomen is soft.     Tenderness: There is no abdominal tenderness.  Musculoskeletal:        General: No swelling or deformity.  Skin:    General: Skin is warm and dry.  Neurological:     General: No  focal deficit present.     Mental Status: Mental status is at baseline.      Assessment & Plan:   See Encounters Tab for problem based charting.  Patient discussed with Dr. Philipp Ovens

## 2019-08-17 NOTE — Assessment & Plan Note (Signed)
Patient's throat pain improved after stopping her allegra pills which she found difficult to swallow. Will stop these and have her continue with Flonase alone for now. Symptoms are controlled today. - Flonase Dailt

## 2019-08-20 NOTE — Progress Notes (Signed)
Internal Medicine Clinic Attending  Case discussed with Dr. Melvin  at the time of the visit.  We reviewed the resident's history and exam and pertinent patient test results.  I agree with the assessment, diagnosis, and plan of care documented in the resident's note.  

## 2019-09-05 ENCOUNTER — Ambulatory Visit: Payer: Medicare Other | Admitting: Physician Assistant

## 2019-10-04 NOTE — Addendum Note (Signed)
Addended by: Hulan Fray on: 10/04/2019 07:42 PM   Modules accepted: Orders

## 2019-12-13 ENCOUNTER — Telehealth: Payer: Self-pay | Admitting: Physician Assistant

## 2019-12-13 NOTE — Telephone Encounter (Signed)
Spoke with the patient. She is better at this moment.  She ate a donut today. She felt a severe pain "like I was having a heart attack and I called 911." She was sipping water while waiting for EMS. This made it feel as if "something moved on down" and she began to feel better. EMS "checked me out and said I was fine."  She is concerned about swallowing and the pain. Wants to be seen. Appointment scheduled.

## 2019-12-13 NOTE — Telephone Encounter (Signed)
Pt is experiencing severe stomach pain and would like to get worked in [place]. No appts available.

## 2019-12-14 ENCOUNTER — Emergency Department (HOSPITAL_COMMUNITY)
Admission: EM | Admit: 2019-12-14 | Discharge: 2019-12-14 | Disposition: A | Discharge status: Home / Self Care | Payer: Medicare Other | Attending: Emergency Medicine | Admitting: Emergency Medicine

## 2019-12-14 ENCOUNTER — Other Ambulatory Visit: Payer: Self-pay

## 2019-12-14 ENCOUNTER — Ambulatory Visit (INDEPENDENT_AMBULATORY_CARE_PROVIDER_SITE_OTHER): Payer: Medicare Other | Admitting: Physician Assistant

## 2019-12-14 ENCOUNTER — Encounter: Payer: Self-pay | Admitting: Physician Assistant

## 2019-12-14 VITALS — BP 130/70 | HR 98 | Ht 66.0 in | Wt 139.0 lb

## 2019-12-14 DIAGNOSIS — I493 Ventricular premature depolarization: Secondary | ICD-10-CM | POA: Diagnosis not present

## 2019-12-14 DIAGNOSIS — Z5321 Procedure and treatment not carried out due to patient leaving prior to being seen by health care provider: Secondary | ICD-10-CM | POA: Insufficient documentation

## 2019-12-14 DIAGNOSIS — R0789 Other chest pain: Secondary | ICD-10-CM | POA: Diagnosis not present

## 2019-12-14 DIAGNOSIS — R1013 Epigastric pain: Secondary | ICD-10-CM

## 2019-12-14 DIAGNOSIS — R109 Unspecified abdominal pain: Secondary | ICD-10-CM | POA: Insufficient documentation

## 2019-12-14 NOTE — Progress Notes (Signed)
Chief Complaint: Chest pain and dysphagia  HPI:    [name] [name] is an 86 year old Caucasian female with a past medical history as listed below including TIA, anxiety and PSVT, known to Dr. Ardis Hughs, who presents to clinic today with a complaint of chest pain and dysphagia.     06/14/2018 patient presented to clinic and complained of epigastric pressure.  At that time it was discussed that she been seen in the office a year ago and had similar complaints with recurrent episodes of epigastric/upper abdominal discomfort associated with a pulsating or heartbeat type feeling in her upper abdomen.  That time she had a CT of the abdomen pelvis which was negative other than extensive diverticulosis and was referred to cardiology.  It was discussed she had a recent hospitalization at that time with palpitations and PSVT.  Cardiac catheter in 2019 which was negative.  She been followed by Dr. Lovena Le and had been on flecainide for years.  At that visit described recurrent frequent episodes over the past month.  She had been in the ER June 03, 2018.  At that time it was discussed that her current symptoms were likely not GI etiology and secondary to current episodes of arrhythmia.  She was scheduled for a barium swallow and upper GI to rule out any significant upper gut pathology.  Also scheduled for a semiurgent work and cardiology appointment with Dr. Lovena Le.    06/16/2018 upper GI with contrast showed no significant findings and a barium tablet passed without delay into the stomach.    02/16/2019 CT abdomen pelvis with contrast with colonic diverticulosis without diverticulitis.    08/02/2019 patient seen by cardiology for PVCs.  At that time primarily complaint of GI complaints including generalized abdominal discomfort, nausea and a poor appetite.    12/13/2019 patient called and describes severe epigastric pain.  Apparently was eating a doughnut and felt like it got stuck with severe pain afterwards.    Today, the  patient presents to clinic and explains that for the past 2 weeks off-and-on she has had trouble with palpitations and what feels like an epigastric/chest pain.  In fact, yesterday she ate a donut and felt like it got stuck on its way down.  She ate water and felt like it gave way and then started with what she describes as palpitations in her stomach.  These were so severe rated as a 10/10 that she called the ambulance and they came and checked her out.  They told her they did not think it was her heart.  In clinic today she tells me she drank an Ensure just prior to coming up here and again started with this pain which is rated as a 10/10 described as palpitations/chest pain.  Tells me she has an appointment Tuesday with her cardiologist but "I cannot make it like this until then".  Apparently is very sensitive to certain medicines including GI cocktail for which she had an anaphylactic-like reaction and pantoprazole.  Describes that she chewed and Alka-Seltzer at home which may be helped a little.    Denies fever, chills, weight loss or change in bowel habits.     Past Medical History:  Diagnosis Date  . Anemia    iron def  . Anxiety   . Arrhythmia    paroxysmal SVT and symtomatic PAC's  . Diverticulosis    colon  . Elevated troponin   . GERD 04/02/2008   Qualifier: Diagnosis of  By: Jenny Reichmann MD, Hunt Oris   .  History of colonic polyps    cecal  . Hypertension   . 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 Osteoporosis 07/21/2011   t score -2.7  . Palpitations 05/24/2017  . Stricture esophagus   . Vitamin D deficiency    2010    Past Surgical History:  Procedure Laterality Date  . A&P, enterocele repair     s/p 2003  . ABDOMINAL HYSTERECTOMY    . BREAST REDUCTION SURGERY     s/p 1996      PATIENT DENIES BREAST REDUCTION   . HEMORRHOID SURGERY    . LEFT HEART CATH AND CORONARY ANGIOGRAPHY N/A 05/25/2017   Procedure: LEFT HEART CATH AND  CORONARY ANGIOGRAPHY;  Surgeon: Jettie Booze, MD;  Location: [facility];  Service: Cardiovascular;  Laterality: N/A;    Current Outpatient Medications  Medication Sig Dispense Refill  . aspirin EC 325 MG tablet Take 325 mg by mouth daily as needed (headache).    Marland Kitchen Cod Liver Oil CAPS Take 1 capsule by mouth daily.    . flecainide (TAMBOCOR) 50 MG tablet Take 1 tablet (50 mg total) by mouth See admin instructions. Take 2 tablets (100 mg) by mouth every morning and 1 tablet (50 mg) at night 270 tablet 1   No current facility-administered medications for this visit.    Allergies as of 12/14/2019 - Review Complete 12/14/2019  Allergen Reaction Noted  . Other Anaphylaxis 07/22/2017  . Pantoprazole Other (See Comments) 07/22/2017  . Zyrtec [cetirizine] Palpitations 04/11/2014  . Ciprofloxacin  03/09/2019    Family History  Problem Relation Age of Onset  . Stroke Father        @ 29  . Hypertension Father   . Hypertension Sister   . Cancer Sister 60       brain  . Breast cancer Other 12       breast  . Breast cancer Daughter 59       spread to her brain  . Cancer Other 58       colon/MELANOMA  . Stroke Mother        medication induced  . Kidney disease Brother   . Cancer Brother     Social History   Socioeconomic History  . Marital status: Widowed    Spouse name: Not on file  . Number of children: 2  . Years of education: Not on file  . Highest education level: Not on file  Occupational History  . Occupation: Retired  Tobacco Use  . Smoking status: Former Smoker    Quit date: 05/24/1968    Years since quitting: 51.5  . Smokeless tobacco: Never Used  . Tobacco comment: only smoked 7 years  Vaping Use  . Vaping Use: Never used  Substance and Sexual Activity  . Alcohol use: No    Alcohol/week: 0.0 standard drinks  . Drug use: No  . Sexual activity: Never    Comment: 1st intercourse 38 yo-1 partner  Other Topics Concern  . Not on file  Social History  Narrative  . Not on file   Social Determinants of Health   Financial Resource Strain:   . Difficulty of Paying Living Expenses:   Food Insecurity:   . Worried About Charity fundraiser in the Last Year:   . Arboriculturist in the Last Year:   Transportation Needs:   . Film/video editor (Medical):   Marland Kitchen Lack  of Transportation (Non-Medical):   Physical Activity:   . Days of Exercise per Week:   . Minutes of Exercise per Session:   Stress:   . Feeling of Stress :   Social Connections:   . Frequency of Communication with Friends and Family:   . Frequency of Social Gatherings with Friends and Family:   . Attends Religious Services:   . Active Member of Clubs or Organizations:   . Attends Archivist Meetings:   Marland Kitchen Marital Status:   Intimate Partner Violence:   . Fear of Current or Ex-Partner:   . Emotionally Abused:   Marland Kitchen Physically Abused:   . Sexually Abused:     Review of Systems:    Constitutional: No weight loss, fever or chills Cardiovascular: No chest pain Respiratory: No SOB  Gastrointestinal: See HPI and otherwise negative   Physical Exam:  Vital signs: BP (!) 130/70   Pulse 98   Ht 5\' 6"  (1.676 m)   Wt 139 lb (63 kg)   LMP 05/24/1970   BMI 22.44 kg/m   Constitutional:   Pleasant Elderly Caucasian female appears to be in mild distress, Well developed, Well nourished, alert and cooperative Respiratory: Respirations even and unlabored. Lungs clear to auscultation bilaterally.   No wheezes, crackles, or rhonchi.  Cardiovascular: Normal S1, S2. No MRG .tachycardic. No peripheral edema, cyanosis or pallor.  Gastrointestinal:  Soft, nondistended, nontender. No rebound or guarding. Normal bowel sounds. No appreciable masses or hepatomegaly. Psychiatric: Demonstrates good judgement and reason without abnormal affect or behaviors.  RELEVANT LABS AND IMAGING: CBC    Component Value Date/Time   WBC 5.5 07/11/2019 1120   WBC 3.9 (L) 02/16/2019 1542   RBC  4.26 07/11/2019 1120   RBC 4.04 02/16/2019 1542   HGB 13.3 07/11/2019 1120   HCT 39.8 07/11/2019 1120   PLT 308 07/11/2019 1120   MCV 93 07/11/2019 1120   MCH 31.2 07/11/2019 1120   MCH 31.9 02/16/2019 1542   MCHC 33.4 07/11/2019 1120   MCHC 33.4 02/16/2019 1542   RDW 12.8 07/11/2019 1120   LYMPHSABS 1.1 07/11/2019 1120   MONOABS 0.5 01/28/2019 1339   EOSABS 0.0 07/11/2019 1120   BASOSABS 0.0 07/11/2019 1120    CMP     Component Value Date/Time   NA 140 07/11/2019 1120   K 4.7 07/11/2019 1120   CL 101 07/11/2019 1120   CO2 25 07/11/2019 1120   GLUCOSE 105 (H) 07/11/2019 1120   GLUCOSE 102 (H) 02/16/2019 1542   BUN 14 07/11/2019 1120   CREATININE 0.69 07/11/2019 1120   CREATININE 0.83 12/16/2015 1446   CALCIUM 9.7 07/11/2019 1120   PROT 7.1 07/11/2019 1120   ALBUMIN 4.5 07/11/2019 1120   AST 24 07/11/2019 1120   ALT 10 07/11/2019 1120   ALKPHOS 87 07/11/2019 1120   BILITOT 0.2 07/11/2019 1120   GFRNONAA 79 07/11/2019 1120   GFRAA 91 07/11/2019 1120    Assessment: 1.  Chest pain: In clinic today patient is tachycardic, history of SVT, is having chest discomfort 2.?  Epigastric pain/dysphagia?:  Question of some element of gastric/esophageal etiology and all of her symptoms; consider esophageal spasms+/-gastritis versus other  Plan: 1.  Patient has been evaluated for this over a year for the past 3 years.  It is always been felt to be cardiac and related to her tachycardia.  Today she is tachycardic in clinic and I believe this is the uncomfortable feeling that she is processing at the moment.  She  tells me she cannot make it till Tuesday in her cardiac appointment.  Recommend that she proceed to the ER at [facility] for further evaluation including an EKG. 2.  If above work-up for cardiac etiology is negative then could trial possibly some Hyoscyamine+/-Pepcid.  Though I am reluctant to do so as she had an anaphylactic reaction to a GI cocktail, not sure which part of the  GI cocktail she reacted to. 3.  Could also repeat an upper GI with barium swallow study +/- esophageal manometry to rule out spasms. 4.  Will discuss with Dr. Ardis Hughs and let him review chart for further recommendations.  Ellouise Newer, PA-C [facility] 12/14/2019, 10:27 AM  Cc: [name], MD

## 2019-12-14 NOTE — Patient Instructions (Signed)
If you are age 84 or older, your body mass index should be between 23-30. Your Body mass index is 22.44 kg/m. If this is out of the aforementioned range listed, please consider follow up with your Primary Care Provider.  If you are age 25 or younger, your body mass index should be between 19-25. Your Body mass index is 22.44 kg/m. If this is out of the aformentioned range listed, please consider follow up with your Primary Care Provider.   Go to the emergency room.

## 2019-12-14 NOTE — ED Triage Notes (Signed)
Per patient:  Patient reports to the ER for abdominal pain. Patient reports she was sent by GI because she ate a donut and then had pain. Patient reports she has been worked up for abdominal pain before. Patient reports she is not currently having chest pain, SOB, N/V/D.

## 2019-12-15 ENCOUNTER — Emergency Department (HOSPITAL_COMMUNITY)
Admission: EM | Admit: 2019-12-15 | Discharge: 2019-12-16 | Disposition: A | Discharge status: Home / Self Care | Payer: Medicare Other | Attending: Emergency Medicine | Admitting: Emergency Medicine

## 2019-12-15 ENCOUNTER — Other Ambulatory Visit: Payer: Self-pay

## 2019-12-15 ENCOUNTER — Encounter (HOSPITAL_COMMUNITY): Payer: Self-pay | Admitting: Emergency Medicine

## 2019-12-15 ENCOUNTER — Emergency Department (HOSPITAL_COMMUNITY): Payer: Medicare Other

## 2019-12-15 DIAGNOSIS — R079 Chest pain, unspecified: Secondary | ICD-10-CM | POA: Diagnosis not present

## 2019-12-15 DIAGNOSIS — Z5321 Procedure and treatment not carried out due to patient leaving prior to being seen by health care provider: Secondary | ICD-10-CM | POA: Insufficient documentation

## 2019-12-15 DIAGNOSIS — R0789 Other chest pain: Secondary | ICD-10-CM | POA: Diagnosis not present

## 2019-12-15 DIAGNOSIS — R1084 Generalized abdominal pain: Secondary | ICD-10-CM | POA: Diagnosis not present

## 2019-12-15 DIAGNOSIS — I440 Atrioventricular block, first degree: Secondary | ICD-10-CM | POA: Diagnosis not present

## 2019-12-15 DIAGNOSIS — J449 Chronic obstructive pulmonary disease, unspecified: Secondary | ICD-10-CM | POA: Diagnosis not present

## 2019-12-15 DIAGNOSIS — R002 Palpitations: Secondary | ICD-10-CM | POA: Insufficient documentation

## 2019-12-15 DIAGNOSIS — J90 Pleural effusion, not elsewhere classified: Secondary | ICD-10-CM | POA: Diagnosis not present

## 2019-12-15 DIAGNOSIS — I491 Atrial premature depolarization: Secondary | ICD-10-CM | POA: Diagnosis not present

## 2019-12-15 DIAGNOSIS — R1013 Epigastric pain: Secondary | ICD-10-CM | POA: Diagnosis not present

## 2019-12-15 DIAGNOSIS — I4430 Unspecified atrioventricular block: Secondary | ICD-10-CM | POA: Diagnosis not present

## 2019-12-15 DIAGNOSIS — R52 Pain, unspecified: Secondary | ICD-10-CM | POA: Diagnosis not present

## 2019-12-15 LAB — CBC
HCT: 41 % (ref 36.0–46.0)
Hemoglobin: 13.2 g/dL (ref 12.0–15.0)
MCH: 30.8 pg (ref 26.0–34.0)
MCHC: 32.2 g/dL (ref 30.0–36.0)
MCV: 95.6 fL (ref 80.0–100.0)
Platelets: 265 10*3/uL (ref 150–400)
RBC: 4.29 MIL/uL (ref 3.87–5.11)
RDW: 13.2 % (ref 11.5–15.5)
WBC: 3.7 10*3/uL — ABNORMAL LOW (ref 4.0–10.5)
nRBC: 0 % (ref 0.0–0.2)

## 2019-12-15 LAB — BASIC METABOLIC PANEL
Anion gap: 13 (ref 5–15)
BUN: 12 mg/dL (ref 8–23)
CO2: 25 mmol/L (ref 22–32)
Calcium: 9.6 mg/dL (ref 8.9–10.3)
Chloride: 102 mmol/L (ref 98–111)
Creatinine, Ser: 0.75 mg/dL (ref 0.44–1.00)
GFR calc Af Amer: >60 mL/min (ref >60)
GFR calc non Af Amer: >60 mL/min (ref >60)
Glucose, Bld: 186 mg/dL — ABNORMAL HIGH (ref 70–99)
Potassium: 3.6 mmol/L (ref 3.5–5.1)
Sodium: 140 mmol/L (ref 135–145)

## 2019-12-15 LAB — TROPONIN I (HIGH SENSITIVITY): Troponin I (High Sensitivity): 5 ng/L (ref <18)

## 2019-12-15 MED ORDER — SODIUM CHLORIDE 0.9% FLUSH: 3.0000 mL | Freq: Once | INTRAVENOUS | Status: DC

## 2019-12-15 NOTE — ED Triage Notes (Signed)
Pt presents to ED from home BIB [facility]. Pt c/o intermitten epigastric pain beginning wed. Pt reports that it is cramping and reports she can sometimes feel it pulsing. Pt reports it worsens after eating. During triage pt reports feeling heart palpitations that make her feel weak, and appears to have more work of breathing.

## 2019-12-16 NOTE — ED Notes (Signed)
Stated her son was coming to pick her up

## 2019-12-17 ENCOUNTER — Telehealth: Payer: Self-pay

## 2019-12-17 ENCOUNTER — Telehealth: Payer: Self-pay | Admitting: Internal Medicine

## 2019-12-17 DIAGNOSIS — R1310 Dysphagia, unspecified: Secondary | ICD-10-CM

## 2019-12-17 NOTE — Telephone Encounter (Signed)
-----   Message from Levin Erp, [place] sent at 12/17/2019  8:56 AM EDT ----- Can you call and see how she is doing- See Dr. Ardis Hughs addendum- please order UGI with MBSS-thanks-JLL ----- Message ----- From: Milus Banister, MD Sent: 12/17/2019   5:17 AM EDT To: Levin Erp, PA    ----- Message ----- From: Levin Erp, [place] Sent: 12/14/2019  11:16 AM EDT To: Milus Banister, MD

## 2019-12-17 NOTE — Telephone Encounter (Addendum)
The pt has been advised and instructed.  She was provided time to write all the information down and verbalized understanding.

## 2019-12-17 NOTE — Telephone Encounter (Signed)
New Message:   Pt says she have been to the ER 2 times. She says she would like to talk to Sonia Baller to get some advice, she is still having the same problems.

## 2019-12-17 NOTE — Progress Notes (Signed)
I agree with the above note, plan.  I think repeating esophageal evaluation is a good idea, starting with non-invasive testing with barium esophagram and MBSS.  Thanks

## 2019-12-18 ENCOUNTER — Ambulatory Visit (INDEPENDENT_AMBULATORY_CARE_PROVIDER_SITE_OTHER): Payer: Medicare Other | Admitting: Internal Medicine

## 2019-12-18 ENCOUNTER — Other Ambulatory Visit: Payer: Self-pay

## 2019-12-18 ENCOUNTER — Encounter: Payer: Self-pay | Admitting: Internal Medicine

## 2019-12-18 VITALS — BP 134/80 | HR 68 | Ht 66.0 in | Wt 138.8 lb

## 2019-12-18 DIAGNOSIS — I491 Atrial premature depolarization: Secondary | ICD-10-CM | POA: Diagnosis not present

## 2019-12-18 DIAGNOSIS — I10 Essential (primary) hypertension: Secondary | ICD-10-CM

## 2019-12-18 DIAGNOSIS — I493 Ventricular premature depolarization: Secondary | ICD-10-CM | POA: Diagnosis not present

## 2019-12-18 NOTE — Telephone Encounter (Signed)
You have been scheduled for an Upper GI Series at [facility]. Your appointment is on 12/25/19 at 930 am. Please arrive 15 minutes prior to your test for registration. Make sure not to eat or drink anything after midnight on the night before your test. If you need to reschedule, please call radiology at [phone]. _________________________________________ An upper GI series uses x rays to help diagnose problems of the upper GI tract, which includes the esophagus, stomach, and duodenum. The duodenum is the first part of the small intestine. An upper GI series is conducted by a radiology technologist or a radiologist--a doctor who specializes in x-ray imaging--at a hospital or outpatient center. While sitting or standing in front of an x-ray machine, the patient drinks barium liquid, which is often white and has a chalky consistency and taste. The barium liquid coats the lining of the upper GI tract and makes signs of disease show up more clearly on x rays. X-ray video, called fluoroscopy, is used to view the barium liquid moving through the esophagus, stomach, and duodenum. Additional x rays and fluoroscopy are performed while the patient lies on an x-ray table. To fully coat the upper GI tract with barium liquid, the technologist or radiologist may press on the abdomen or ask the patient to change position. Patients hold still in various positions, allowing the technologist or radiologist to take x rays of the upper GI tract at different angles. If a technologist conducts the upper GI series, a radiologist will later examine the images to look for problems.  This test typically takes about 1 hour to complete. _________________________________________  Dennis Bast have been scheduled for a modified barium swallow on 12/28/14 at [facility] at 1 pm. Please arrive 15 minutes prior to your test for registration. You will go to [facility] (1st Floor) for your appointment. Should you need to cancel or reschedule your appointment, please  contact [phone] [place]). _________________________________________ A Modified Barium Swallow Study, or MBS, is a special x-ray that is taken to check swallowing skills. It is carried out by a Stage manager and a Psychologist, clinical (SLP). During this test, yourmouth, throat, and esophagus, a muscular tube which connects your mouth to your stomach, is checked. The test will help you, your doctor, and the SLP plan what types of foods and liquids are easier for you to swallow. The SLP will also identify positions and ways to help you swallow more easily and safely. What will happen during an MBS? You will be taken to an x-ray room and seated comfortably. You will be asked to swallow small amounts of food and liquid mixed with barium. Barium is a liquid or paste that allows images of your mouth, throat and esophagus to be seen on x-ray. The x-ray captures moving images of the food you are swallowing as it travels from your mouth through your throat and into your esophagus. This test helps identify whether food or liquid is entering your lungs (aspiration). The test also shows which part of your mouth or throat lacks strength or coordination to move the food or liquid in the right direction. This test typically takes 30 minutes to 1 hour to complete. _________________________________________

## 2019-12-18 NOTE — Telephone Encounter (Signed)
Pt has appt to see GT 7/27.  She will discuss issues with Dr. Lovena Le.

## 2019-12-18 NOTE — Progress Notes (Signed)
HPI Mrs. Hyslop returns today for followup. She is a pleasant 86 yo woman with palpitations due to PAC's and PVC's. She has been fairly well controlled on flecainide. She was seen in the ED last week with abdominal pain. No obvious etiology and it eventually resolved. No chest pain. No edema. No syncope.  She left the ER AMA as she got tired of waiting.  Allergies  Allergen Reactions  . Other Anaphylaxis    Reaction to GI Cocktail  . Pantoprazole Other (See Comments)    Upset stomach and weight loss  . Zyrtec [Cetirizine] Palpitations  . Ciprofloxacin     Rash with burning all over body     Current Outpatient Medications  Medication Sig Dispense Refill  . aspirin EC 325 MG tablet Take 325 mg by mouth daily as needed (headache).    Marland Kitchen Cod Liver Oil CAPS Take 1 capsule by mouth daily.    . flecainide (TAMBOCOR) 50 MG tablet Take 1 tablet (50 mg total) by mouth See admin instructions. Take 2 tablets (100 mg) by mouth every morning and 1 tablet (50 mg) at night 270 tablet 1   No current facility-administered medications for this visit.     Past Medical History:  Diagnosis Date  . Anemia    iron def  . Anxiety   . Arrhythmia    paroxysmal SVT and symtomatic PAC's  . Diverticulosis    colon  . Elevated troponin   . GERD 04/02/2008   Qualifier: Diagnosis of  By: Jenny Reichmann MD, Hunt Oris   . History of colonic polyps    cecal  . Hypertension   . 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 Osteoporosis 07/21/2011   t score -2.7  . Palpitations 05/24/2017  . Stricture esophagus   . Vitamin D deficiency    2010    ROS:   All systems reviewed and negative except as noted in the HPI.   Past Surgical History:  Procedure Laterality Date  . A&P, enterocele repair     s/p 2003  . ABDOMINAL HYSTERECTOMY    . BREAST REDUCTION SURGERY     s/p 1996      PATIENT DENIES BREAST REDUCTION   . HEMORRHOID SURGERY    . LEFT HEART CATH  AND CORONARY ANGIOGRAPHY N/A 05/25/2017   Procedure: LEFT HEART CATH AND CORONARY ANGIOGRAPHY;  Surgeon: Jettie Booze, MD;  Location: [facility];  Service: Cardiovascular;  Laterality: N/A;     Family History  Problem Relation Age of Onset  . Stroke Father        @ 67  . Hypertension Father   . Hypertension Sister   . Cancer Sister 32       brain  . Breast cancer Other 45       breast  . Breast cancer Daughter 65       spread to her brain  . Cancer Other 58       colon/MELANOMA  . Stroke Mother        medication induced  . Kidney disease Brother   . Cancer Brother      Social History   Socioeconomic History  . Marital status: Widowed    Spouse name: Not on file  . Number of children: 2  . Years of education: Not on file  . Highest education level: Not on file  Occupational History  . Occupation: Retired  Tobacco Use  . Smoking status: Former Smoker    Quit date: 05/24/1968    Years since quitting: 51.6  . Smokeless tobacco: Never Used  . Tobacco comment: only smoked 7 years  Vaping Use  . Vaping Use: Never used  Substance and Sexual Activity  . Alcohol use: No    Alcohol/week: 0.0 standard drinks  . Drug use: No  . Sexual activity: Never    Comment: 1st intercourse 35 yo-1 partner  Other Topics Concern  . Not on file  Social History Narrative  . Not on file   Social Determinants of Health   Financial Resource Strain:   . Difficulty of Paying Living Expenses:   Food Insecurity:   . Worried About Charity fundraiser in the Last Year:   . Arboriculturist in the Last Year:   Transportation Needs:   . Film/video editor (Medical):   Marland Kitchen Lack of Transportation (Non-Medical):   Physical Activity:   . Days of Exercise per Week:   . Minutes of Exercise per Session:   Stress:   . Feeling of Stress :   Social Connections:   . Frequency of Communication with Friends and Family:   . Frequency of Social Gatherings with Friends and Family:   .  Attends Religious Services:   . Active Member of Clubs or Organizations:   . Attends Archivist Meetings:   Marland Kitchen Marital Status:   Intimate Partner Violence:   . Fear of Current or Ex-Partner:   . Emotionally Abused:   Marland Kitchen Physically Abused:   . Sexually Abused:      BP (!) 134/80   Pulse 68   Ht 5\' 6"  (1.676 m)   Wt 138 lb 12.8 oz (63 kg)   LMP 05/24/1970   SpO2 95%   BMI 22.40 kg/m   Physical Exam:  Well appearing NAD HEENT: Unremarkable Neck:  No JVD, no thyromegally Lymphatics:  No adenopathy Back:  No CVA tenderness Lungs:  Clear with no wheezes HEART:  Regular rate rhythm, no murmurs, no rubs, no clicks Abd:  soft, positive bowel sounds, no organomegally, no rebound, no guarding Ext:  2 plus pulses, no edema, no cyanosis, no clubbing Skin:  No rashes no nodules Neuro:  CN II through XII intact, motor grossly intact  Assess/Plan: 1. Palpitations - her symptoms are well controlled on flecainide.  2. Abdominal pain - her symptoms have resolved.  3. HTN - her bp is well controlled.   Mikle Bosworth.D.

## 2019-12-18 NOTE — Patient Instructions (Addendum)
Medication Instructions:  Your physician recommends that you continue on your current medications as directed. Please refer to the Current Medication list given to you today.  *If you need a refill on your cardiac medications before your next appointment, please call your pharmacy*  Lab Work: None ordered.  If you have labs (blood work) drawn today and your tests are completely normal, you will receive your results only by: . MyChart Message (if you have MyChart) OR . A paper copy in the mail If you have any lab test that is abnormal or we need to change your treatment, we will call you to review the results.  Testing/Procedures: None ordered.  Follow-Up: At [facility], you and your health needs are our priority.  As part of our continuing mission to provide you with exceptional heart care, we have created designated Provider Care Teams.  These Care Teams include your primary Cardiologist (physician) and Advanced Practice Providers (APPs -  Physician Assistants and Nurse Practitioners) who all work together to provide you with the care you need, when you need it.  We recommend signing up for the patient portal called "MyChart".  Sign up information is provided on this After Visit Summary.  MyChart is used to connect with patients for Virtual Visits (Telemedicine).  Patients are able to view lab/test results, encounter notes, upcoming appointments, etc.  Non-urgent messages can be sent to your provider as well.   To learn more about what you can do with MyChart, go to [email or web address].    Your next appointment:   Your physician wants you to follow-up in: 6 months with Dr. Taylor. You will receive a reminder letter in the mail two months in advance. If you don't receive a letter, please call our office to schedule the follow-up appointment.    Other Instructions:  

## 2019-12-25 ENCOUNTER — Ambulatory Visit (HOSPITAL_COMMUNITY)
Admission: RE | Admit: 2019-12-25 | Discharge: 2019-12-25 | Disposition: A | Discharge status: Home / Self Care | Payer: Medicare Other | Source: Ambulatory Visit | Attending: Gastroenterology | Admitting: Gastroenterology

## 2019-12-25 ENCOUNTER — Other Ambulatory Visit: Payer: Self-pay

## 2019-12-25 ENCOUNTER — Other Ambulatory Visit: Payer: Self-pay | Admitting: Gastroenterology

## 2019-12-25 DIAGNOSIS — R1310 Dysphagia, unspecified: Secondary | ICD-10-CM

## 2019-12-25 DIAGNOSIS — K5710 Diverticulosis of small intestine without perforation or abscess without bleeding: Secondary | ICD-10-CM | POA: Diagnosis not present

## 2019-12-28 ENCOUNTER — Other Ambulatory Visit (HOSPITAL_COMMUNITY): Payer: Self-pay | Admitting: *Deleted

## 2019-12-28 ENCOUNTER — Ambulatory Visit (HOSPITAL_COMMUNITY)
Admission: RE | Admit: 2019-12-28 | Discharge: 2019-12-28 | Disposition: A | Discharge status: Home / Self Care | Payer: Medicare Other | Source: Ambulatory Visit | Attending: Gastroenterology | Admitting: Gastroenterology

## 2019-12-28 ENCOUNTER — Other Ambulatory Visit: Payer: Self-pay

## 2019-12-28 DIAGNOSIS — R05 Cough: Secondary | ICD-10-CM | POA: Insufficient documentation

## 2019-12-28 DIAGNOSIS — R1310 Dysphagia, unspecified: Secondary | ICD-10-CM | POA: Diagnosis not present

## 2019-12-28 DIAGNOSIS — R059 Cough, unspecified: Secondary | ICD-10-CM

## 2019-12-31 ENCOUNTER — Telehealth: Payer: Self-pay | Admitting: Internal Medicine

## 2019-12-31 MED ORDER — METOPROLOL SUCCINATE ER 25 MG PO TB24
12.5000 mg | ORAL_TABLET | Freq: Every day | ORAL | 3 refills | Status: DC
Start: 2019-12-31 — End: 2020-02-07

## 2019-12-31 NOTE — Telephone Encounter (Signed)
Took call to triage.  Pt is having palpitations.  She is very distressed when she has palpitations.  Does not feel like she can do anything.  Is afraid she is going to pass out.  Reassured Pt that her palpitations are benign.  Pt feels poorly when she is having palpitations.  Pt took flecainide 100 mg this AM and took 50 mg at 1:15 pm with no relief.  States she took some of her ativan 15 minutes ago.  Nothing has worked yet.  Discussed with RU.  Will advise Pt to try Toprol XL 25 mg 1/2 tablet by mouth as needed for palpitations.  Returned call to Pt about 20 minutes after first contact.  She states her palpitations are starting to settle down.  Discussed trying toprol XL 1/2 tablet.  Advised if she felt worse she should go to the ER.    She will send son to pick up prescription.  This nurse will call Pt tomorrow.

## 2019-12-31 NOTE — Telephone Encounter (Signed)
Patient c/o Palpitations:  High priority if patient c/o lightheadedness, shortness of breath, or chest pain  1) How long have you had palpitations/irregular HR/ Afib? Are you having the symptoms now? 2 weeks, yes  2) Are you currently experiencing lightheadedness, SOB or CP? some SOB  3) Do you have a history of afib (atrial fibrillation) or irregular heart rhythm? yes  4) Have you checked your BP or HR? (document readings if available): no  5) Are you experiencing any other symptoms? Patient states she is having a forceful heart beat   Patient states she has been having a forceful heart beat for the last 2 weeks. She states she is also feeling some SOB. She states she cannot get out of her chair without it starting up again.

## 2020-01-01 ENCOUNTER — Telehealth: Payer: Self-pay | Admitting: Physician Assistant

## 2020-01-01 MED ORDER — PANTOPRAZOLE SODIUM 40 MG PO TBEC: 40.0000 mg | DELAYED_RELEASE_TABLET | Freq: Two times a day (BID) | ORAL | 3 refills | Status: DC

## 2020-01-01 NOTE — Telephone Encounter (Signed)
We can try some Pantoprazole 40 mg twice daily.  If she has any element of gastritis this would help with pain.  It is also not an ingredient in GI cocktail, making it less likely she will have a reaction.  Thanks, JLL

## 2020-01-01 NOTE — Telephone Encounter (Signed)
The pt has been advised that as soon as Dr Ardis Hughs is back in the office and reviews the test we will contact her.  She states she continues to have abd pain and burning and wants to know if she can take something for pain.  The note states that Anderson Malta is reluctant of starting medications due to reaction with GI cocktail.  Anderson Malta can you please advise the pt states she is not sure she can continue having pain and burning and is unable to eat and has lost weight.

## 2020-01-01 NOTE — Telephone Encounter (Signed)
Pt is requesting a call back from a nurse regarding her xray results. Pt is also experiencing stomach pain.

## 2020-01-01 NOTE — Telephone Encounter (Signed)
Returned call to Pt to check in after starting Toprol XL for palpitations.  Pt states she feels much better.  Discussed again that she should continue to take her flecainide as normal and take the Toprol when she feels palpitations.  Pt thankful for help and follow up.  Asked Pt to call this nurse in one month to see if she is still doing well.  Pt indicates understanding.

## 2020-01-01 NOTE — Telephone Encounter (Signed)
The prescription has been sent and pt aware. She will await a call from our office regarding test results.

## 2020-01-02 ENCOUNTER — Telehealth: Payer: Self-pay | Admitting: Internal Medicine

## 2020-01-02 ENCOUNTER — Ambulatory Visit (INDEPENDENT_AMBULATORY_CARE_PROVIDER_SITE_OTHER): Payer: Medicare Other | Admitting: Internal Medicine

## 2020-01-02 ENCOUNTER — Telehealth: Payer: Self-pay | Admitting: Physician Assistant

## 2020-01-02 ENCOUNTER — Encounter: Payer: Self-pay | Admitting: Internal Medicine

## 2020-01-02 DIAGNOSIS — R1310 Dysphagia, unspecified: Secondary | ICD-10-CM | POA: Diagnosis not present

## 2020-01-02 DIAGNOSIS — R002 Palpitations: Secondary | ICD-10-CM

## 2020-01-02 DIAGNOSIS — R198 Other specified symptoms and signs involving the digestive system and abdomen: Secondary | ICD-10-CM

## 2020-01-02 MED ORDER — OMEPRAZOLE 40 MG PO CPDR
40.0000 mg | DELAYED_RELEASE_CAPSULE | Freq: Two times a day (BID) | ORAL | 3 refills | Status: DC
Start: 2020-01-02 — End: 2020-02-07

## 2020-01-02 NOTE — Telephone Encounter (Signed)
Dr. Darcey Nora office (PCP) called requesting a sooner appt for pt. Pt is at their office due to abd pain and not being able to eat. Every time pt she tries to eat the pain worsens.

## 2020-01-02 NOTE — Telephone Encounter (Signed)
Do you have a phone number to call back?

## 2020-01-02 NOTE — Assessment & Plan Note (Addendum)
Moderate esophageal dysmotility: Patient is here for follow up and also because her symptoms did not get better. She has had solid dysphagia. and she now has abdominal discomfort with eating. She feels bloated after she eats. She reports normal bowel movement. Her GI symptoms started 3 weeks ago. She referred to GI last visit, upper GI series showed moderated esophageal dysmotility. Further GI recommendation is pending till next appointment. She mentions that she can not wait till next GI appointment.   She can swallow fluid and soft food. But feels bloated after that. Can not eat solid food easily and feels that she lost weight due to that. No nausea or vomiting. Has normal BM. Abdominal exam is benign.  We discussed the result of swallow study and upper GI series with her and about she should follow up with GI for further work up and management.   -She has an appointment with GI at 9/24. She needs to be seen sooner for further recommendations. [name] called the office to ask for earlier appointment. Awaiting Dr. Eugenia Pancoast nurse recommendation. (Her GI doctor, Dr. Edison Nasuti will not be in the office this week)  -She has history of reaction to GI cocktail and pantoprazole. I hesitate to give her Simethicone for bloating. We discussed starting her on H2 blocker for probable gastritis but she prefers to try the old medications that helped her before.

## 2020-01-02 NOTE — Telephone Encounter (Signed)
The pt has been advised that omeprazole has been sent to the pharmacy and an appt has been made for 9/24 with Dr Ardis Hughs.  She also states she has an appt with her PCP today.

## 2020-01-02 NOTE — Telephone Encounter (Signed)
Ok, let's try Omeprazole then. Thanks-JLL

## 2020-01-02 NOTE — Telephone Encounter (Signed)
Called pt, she is distraught over her inability to eat. States she can tolerate oatmeal. But nurse suggested carnation instant breakfast and states she cannot swallow that completely. She has not started the pantoprazole sent by dr Ardis Hughs PA yesterday pm. She states she is not eating and drinking very little. States she needs help. States she will not go to ED ever again

## 2020-01-02 NOTE — Telephone Encounter (Signed)
Pls contact pt [phone], pt need some medicine for her stomach; pt is unable to eat

## 2020-01-02 NOTE — Progress Notes (Signed)
   CC: Abdominal discomfort and f/u of dysphagia  HPI:  [name] is a 86 y.o. female with PMHx as documented below, presented for follow up of abdominal discomfort and f/u of dysphagia. Please refer to problem based charting for further details and assessment and plan of current problem and chronic   Past medical history: HTN (not on medications), HLD, history of repair of rectocele, TIA, anxiety, dysphagia  Medications: aspirin 325 mg daily, flecainide 50 mg, metoprolol succinate 25 mg, omeprazole 40 mg  Past Medical History:  Diagnosis Date  . Anemia    iron def  . Anxiety   . Arrhythmia    paroxysmal SVT and symtomatic PAC's  . Diverticulosis    colon  . Elevated troponin   . GERD 04/02/2008   Qualifier: Diagnosis of  By: Jenny Reichmann MD, Hunt Oris   . History of colonic polyps    cecal  . Hypertension   . 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 Osteoporosis 07/21/2011   t score -2.7  . Palpitations 05/24/2017  . Stricture esophagus   . Vitamin D deficiency    2010   Review of Systems:   Negative except as stated in HPI and today assessment and plan.  Physical Exam:  Vitals:   01/02/20 1532  BP: 137/78  Pulse: 72  Temp: 98 F (36.7 C)  TempSrc: Oral  SpO2: 97%  Weight: 138 lb 4.8 oz (62.7 kg)  Height: 5' 5.5" (1.664 m)   Constitutional: Pleasant lady,  no acute distress.  Head: Normocephalic and atraumatic.  Eyes: Conjunctivae are normal, EOM nl Cardiovascular: RRR, nl S1S2, no murmur, trace bilateral LEE Respiratory: Effort normal and breath sounds normal. No respiratory distress. No wheezes.  GI: Soft. bowel sounds are normal. No distension. There is no tenderness.  Neurological: Is alert and oriented x 3 Skin: Not diaphoretic. No erythema.  Psychiatric: Normal mood and affect. Behavior is normal. Judgment and thought content normal.   Assessment & Plan:   See Encounters Tab for problem based  charting.  Patient discussed with Dr. Rebeca Alert

## 2020-01-02 NOTE — Telephone Encounter (Signed)
[name] [name] the pt is allergic to pantoprazole.  She had UGI with speech can you review?  Dr Ardis Hughs is off this week.  Thank you

## 2020-01-02 NOTE — Telephone Encounter (Signed)
Yes, Dr. [name] is her main PCP, phone [phone]. Pt saw his colleague Dr. Myrtie Hawk today because he was not available.

## 2020-01-02 NOTE — Assessment & Plan Note (Signed)
History of PAC and PVC. Patient is on flecainide and following up with cardiology.  Denies any worsening of palpitation, chest pain. No presyncopal or syncopal episode. Heart exam today shows a regular rhythm.  -Continue follow-up with cardiology -Continue Flecainide per cardiology

## 2020-01-02 NOTE — Patient Instructions (Addendum)
Thank you for allowing [place] to provide your care today. Today we discussed your abdominal pain and difficulty swallowing food. We reviewed your swallow study with you.  Your abdominal exam today is not concerning for acute issue. I understand that you are hesitant to take Pantoprazole or similar medications because prior allergic reaction.  You need to follow up with GI doctor. We called their office and ask them to see you earlier. They left a message for Dr. Eugenia Pancoast nurse. Today, we did not make any changes to your medications. Please take your medications as before.  As always, if having severe symptoms, please seek medical attention at emergency room. Should you have any questions or concerns please call the internal medicine clinic at [phone].    Thank you!

## 2020-01-02 NOTE — Telephone Encounter (Signed)
Any recommendations from the imaging?

## 2020-01-02 NOTE — Telephone Encounter (Signed)
Pt called to inform that she is allergic to Pantroprazole so she needs a different medication. She stated that she cannot eat anything because it upsets her stomach, she would also like to be seen soon. Pls call her.

## 2020-01-02 NOTE — Telephone Encounter (Signed)
Sorry missed that part- but no new recommendations from our standpoint, speech pathology said she didn't have issues swallowing. I am sorry, but she really needs follow up with Dr. Ardis Hughs so they can discuss further, I do not know what else to do for her. Please send him a message and see if he can maybe get her a soon OV.  Thanks-JLL

## 2020-01-03 NOTE — Progress Notes (Signed)
Internal Medicine Clinic Attending  Case discussed with Dr. Masoudi at the time of the visit.  We reviewed the resident's history and exam and pertinent patient test results.  I agree with the assessment, diagnosis, and plan of care documented in the resident's note.  Alexander Raines, M.D., Ph.D.  

## 2020-01-03 NOTE — Telephone Encounter (Signed)
I spoke with the nurse and doctor at the primary care office and advised that we have no sooner appts available.  She is on a wait list to call if we have a cancellation.

## 2020-01-04 ENCOUNTER — Telehealth: Payer: Self-pay | Admitting: Physician Assistant

## 2020-01-04 NOTE — Telephone Encounter (Signed)
The pt has been advised that omeprazole was sent to the pharmacy and should be available for pick up.  She will call and if she continues to have issues she will call back.

## 2020-01-06 ENCOUNTER — Telehealth: Payer: Self-pay | Admitting: Internal Medicine

## 2020-01-06 DIAGNOSIS — R198 Other specified symptoms and signs involving the digestive system and abdomen: Secondary | ICD-10-CM

## 2020-01-06 MED ORDER — FAMOTIDINE 20 MG PO TABS: 20.0000 mg | ORAL_TABLET | Freq: Two times a day (BID) | ORAL | 0 refills | Status: DC

## 2020-01-06 NOTE — Telephone Encounter (Signed)
° °  Reason for call:   I received a call from [name] at 11:50 AM indicating loss of appetite.   Pertinent Data:   [name] mentions that she was in her usual state of health until this morning when she had some ensure with water and started endorsing nausea and 'jumping sensation' in there stomach. Mentions that she has had this issue for years but feels she is 'getting desperate' as her symptoms are not improving. She mentions having outpatient GI appointment coming up in September and has significant anxiety regarding her inability to tolerate oral intake due to her symptoms. She mentions that Dr.Masoudi had sent her medications for these symptoms but she states that she cannot take this medication due to prior adverse reaction to pantoprazole in the past and    Assessment / Plan / Recommendations:   Esophageal dysmotility: Appears to be chronic issue. Has upcoming appt with GI. Provided reassurance. Pt refuses to trial PPI due to adverse reaction to pantoprazole. Offered H2 blocker as alternative. [name] is agreeable to try.  As always, pt is advised that if symptoms worsen or new symptoms arise, they should go to an urgent care facility or to to ER for further evaluation.   [name] Anis, MD   01/06/2020, 11:54 AM

## 2020-01-07 ENCOUNTER — Telehealth: Payer: Self-pay | Admitting: Internal Medicine

## 2020-01-07 ENCOUNTER — Telehealth: Payer: Self-pay | Admitting: *Deleted

## 2020-01-07 NOTE — Telephone Encounter (Signed)
Patient calling to f/u with her GI Appt.  Patient would like a call back in reference to her appointment being changed form 02/15/2020 to a sooner appointment.

## 2020-01-07 NOTE — Telephone Encounter (Signed)
RETURNED CALL TO PATIENT REGARDING GI APPOINTMENT. PATIENT IS ON CALL LIST THAT IF A PATIENT CANCEL, PATIENT WOULD BE CALLED TO HAVE A SOONER APPOINTMENT. PATIENT IS AWARE.Marland Kitchen

## 2020-01-11 ENCOUNTER — Telehealth: Payer: Self-pay | Admitting: Internal Medicine

## 2020-01-11 NOTE — Telephone Encounter (Signed)
Returned call to Pt.  Pt states she has had increased palpitations since starting the Pepcid  (per review by pharmacy-there is not interaction between flecainide and pepcid)  Pt will try taking Toprol XL 1/2 tablet daily instead of PRN and will call this nurse if she has worsening palpitations.  Tried to reassure Pt and encourage her to continue pepcid.  Unsure what Pt will do.  Advised Pt to call if she has further issues with palpitations.

## 2020-01-11 NOTE — Telephone Encounter (Signed)
° ° °  Pt c/o medication issue:  1. Name of Medication:   famotidine (PEPCID) 20 MG tablet     2. How are you currently taking this medication (dosage and times per day)? Take 1 tablet (20 mg total) by mouth 2 (two) times daily.  3. Are you having a reaction (difficulty breathing--STAT)?   4. What is your medication issue? Pt said this is a new prescription she had and she thinks its reacting to her flecainide, she said she's having palpitations and upset stomack

## 2020-01-12 ENCOUNTER — Telehealth: Payer: Self-pay | Admitting: Internal Medicine

## 2020-01-12 NOTE — Telephone Encounter (Signed)
   Reason for call:   I received a call from [name] at 2 PM indicating that she needs medication for anxiety   Pertinent Data:   [name] [name] calls on the on-call number stating that she has been having worsening anxiety due to her dysphagia. She mentions that the famotidine prescribed a week prior has helped her symptoms significantly but she mentions that she still needs some additional medications for her anxiety associated with these symptoms. She states that she was prescribed lorazepam in the past by Dr[facility]Nelson [facility] and requests additional refills for her anxiety.    Assessment / Plan / Recommendations   Anxiety: Patient requesting lorazepam. PDMP confirms 1 time prescription for lorazepam. Advised that benzodiazepines are not first line for anxiety and I will not provide prescription over the phone. [name] [name] states she feels she needs to be admitted to the hospital due to worsening anxiety and inability to tolerate food. She requests appointment with [facility] for further assessment. Message sent to [facility] front desk for appointment.  As always, pt is advised that if symptoms worsen or new symptoms arise, they should go to an urgent care facility or to to ER for further evaluation.   Mosetta Anis, MD  01/12/2020, 5:51 PM

## 2020-01-16 ENCOUNTER — Other Ambulatory Visit: Payer: Self-pay

## 2020-01-16 ENCOUNTER — Encounter: Payer: Self-pay | Admitting: Internal Medicine

## 2020-01-16 ENCOUNTER — Ambulatory Visit (INDEPENDENT_AMBULATORY_CARE_PROVIDER_SITE_OTHER): Payer: Medicare Other | Admitting: Internal Medicine

## 2020-01-16 DIAGNOSIS — F419 Anxiety disorder, unspecified: Secondary | ICD-10-CM

## 2020-01-16 DIAGNOSIS — K2289 Other specified disease of esophagus: Secondary | ICD-10-CM

## 2020-01-16 DIAGNOSIS — K228 Other specified diseases of esophagus: Secondary | ICD-10-CM

## 2020-01-16 MED ORDER — LORAZEPAM 0.5 MG PO TABS
0.2500 mg | ORAL_TABLET | Freq: Every day | ORAL | 0 refills | Status: DC | PRN
Start: 2020-01-16 — End: 2020-02-07

## 2020-01-16 NOTE — Patient Instructions (Addendum)
[name] [name],  It was a pleasure to see you today. Thank you for coming in.   Today we discussed your decreased food intake and weight loss.  Your GI tract is moving slower then normal which is causing these issues.  If will be important for you to eat small amounts of food, focus on liquid foods such as Ensure or Smoothies.  Diet modification: Eat slowly, don't talk while you eat, take small bites, sit in an upright position after meals, use a blender to puree solid foods if needed, thicken liquids with milk, juice, broth, gravy or starch to make swallowing easier. You will need to eat throughout the day to make sure you get enough nutrition. I have sent a referral to our nutritionist to get you some more information. Your vitals and exam today are reassuring.   We also discussed your anxiety. I am sorry that you are not feeling well. We prescribed a short course of lorazepam for now since an anti-anxiety medication may not be good with the flecainide. We are seeing if this can be adjusted. Please follow up with Ms. Hicks for counseling.    Please return to clinic in 1 month or sooner if needed.   Thank you again for coming in.   [name].

## 2020-01-16 NOTE — Progress Notes (Signed)
   CC: Anxiety, dysphagia  HPI:  [name] is a 86 y.o. with a history of anxiety, GERD, HTN, and esophageal stricture presenting for concerns about her dysphagia and anxiety.   Past Medical History:  Diagnosis Date  . Anemia    iron def  . Anxiety   . Arrhythmia    paroxysmal SVT and symtomatic PAC's  . Diverticulosis    colon  . Elevated troponin   . GERD 04/02/2008   Qualifier: Diagnosis of  By: Jenny Reichmann MD, Hunt Oris   . History of colonic polyps    cecal  . Hypertension   . 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 Osteoporosis 07/21/2011   t score -2.7  . Palpitations 05/24/2017  . Stricture esophagus   . Vitamin D deficiency    2010   Review of Systems:   Constitutional: Negative for chills and fever.  Respiratory: Negative for shortness of breath.   Cardiovascular: Negative for chest pain and leg swelling.  Gastrointestinal: Negative for nausea and vomiting.  Positive for abdominal pain.   And decreased appetite Neurological: Negative for dizziness and headaches.  Psych: Positive for anxiety and depression.   Physical Exam:  Vitals:   01/16/20 1359  BP: (!) 155/83  Pulse: 77  Temp: 98 F (36.7 C)  TempSrc: Oral  SpO2: 98%  Weight: 133 lb 3.2 oz (60.4 kg)  Height: 5' 5.5" (1.664 m)   Physical Exam Constitutional:      Appearance: Normal appearance.     Comments: Anxious appearing  HENT:     Head: Normocephalic and atraumatic.     Mouth/Throat:     Mouth: Mucous membranes are moist.     Pharynx: Oropharynx is clear.  Cardiovascular:     Rate and Rhythm: Normal rate and regular rhythm.     Pulses: Normal pulses.     Heart sounds: Normal heart sounds.  Pulmonary:     Effort: Pulmonary effort is normal.     Breath sounds: Normal breath sounds.  Abdominal:     General: Abdomen is flat. Bowel sounds are normal. There is no distension.     Palpations: Abdomen is soft.     Tenderness: There is no  abdominal tenderness. There is no guarding or rebound.  Musculoskeletal:        General: No swelling. Normal range of motion.  Skin:    General: Skin is warm and dry.     Capillary Refill: Capillary refill takes less than 2 seconds.  Neurological:     General: No focal deficit present.     Mental Status: She is alert and oriented to person, place, and time.  Psychiatric:        Mood and Affect: Mood normal.        Behavior: Behavior normal.     Assessment & Plan:   See Encounters Tab for problem based charting.  Patient discussed with Dr. Jimmye Norman

## 2020-01-17 DIAGNOSIS — K224 Dyskinesia of esophagus: Secondary | ICD-10-CM | POA: Insufficient documentation

## 2020-01-17 NOTE — Assessment & Plan Note (Signed)
Patient reports concern about her abdominal issues, she reports not being able to eat very much and is worried that she will not make it until her appointment with GI.  She states that when she eats she will have some epigastric pain, she has been eating about 1/3 cup of oatmeal in the morning, and 1.5 ensures during the day.  She feels that when she eats the food will get stuck in the middle part of her stomach and cause a lot of discomfort.  She reports that the famotidine has helped with her symptoms, feels like she is able to eat a little bit more with this.  She denies any nausea, vomiting, fevers, chills, lightheadedness, dizziness, shortness of breath, or chest pain.  She does endorse some mild fatigue.  She reports feeling very anxious about this and states she will occasionally get palpitations which she thinks is related.  Her weight today is 133 pounds, on her last visit 2 weeks ago she was 138 pounds.  On exam she is very anxious appearing, abdominal exam showed normoactive bowel sounds, no tenderness to palpation, no rebound or guarding noted.  Patient had an upper GI x-ray on 12/25/2019 that showed findings consistent with moderate presbyesophagus, no evidence of hiatal hernia or GERD, small proximal duodenal diverticulum.  Swallow study on 8/6 was limited due to patient's reticence to participate, however no significant findings is noted on the testing that was done.  They recommended slower rate, small sips, and to remain semiupright after meals. We reviewed the findings on her prior imaging studies, reviewed dietary adjustments including increasing of liquid and pure forms of food, sitting upright, eating multiple small meals throughout the day, and eating small amounts of food during meals.  Her vitals and exam are both reassuring.  We discussed that this condition is treated with lifestyle modifications.  Discussed referral to nutritionist to discuss different options for pureing food and  ensuring adequate calorie intake.   -Provide information on dietary adjustments -Continue follow-up with GI -Nutrition consult

## 2020-01-17 NOTE — Assessment & Plan Note (Signed)
Patient reports that she is having significant amount of anxiety due to her weight loss and abdominal symptoms.  She reports concern about not making it before her GI appointment.  She is very concerned about significant weight loss and appeared very anxious on exam.  She was requesting a refill of her lorazepam which she states she received from urgent care previously, she takes 0.25 mg as needed.  We discussed that lorazepam is typically not first-line for anxiety, due to the side effects including falls, Alzheimer's, somnolence, and other complications.  However patient is currently on flecainide there is risk with starting an SSRI.  Discussed that we will give a short course of lorazepam as needed for now, however will reach out to her cardiologist to see if flecainide could be adjusted to a different medication.  Would prefer to do an SSRI if able.  -Refill lorazepam 0.25 mg as needed -Continue to follow with Ms. Hicks -Need to see if flecainide can be adjusted to a different antiarrhythmic

## 2020-01-21 NOTE — Progress Notes (Signed)
Internal Medicine Clinic Attending  Case discussed on DOS 01/16/20 with Dr. Sherry Ruffing at the time of the visit.  We reviewed the resident's history and exam and pertinent patient test results.  I agree with the assessment, diagnosis, and plan of care documented in the resident's note. [name] problems present a conundrum; she is experiencing an ongoing cycle of palpitations, dysphagia due to esophageal dysmotilty, and intense anxiety, for which she stated she felt bad enough sometimes that she thinks she needs hospital level care.  There is no particular treatment in older adults for dysmotility (anything anticholinergic would be high risk for potential adverse events) aside from eating and drinking small amounts throughout the day.  A recent intervention of H2B has evidently been helpful.  Her GI appt is not until 02/15/20.  Ideal treatment for anxiety would be SSRI, though there is potential interaction with flecainide.  I messaged cardiologist Dr. Lovena Le who would favor the risk of using lorazepam over the risk of stopping flecainide with intent to start SSRI, though he/we agree that this would not be ideal given her age and multimorbidity (high risk of cognitive impairment, falls).  No changes made at this time, though reassurance was given.

## 2020-01-29 ENCOUNTER — Telehealth: Payer: Self-pay | Admitting: Internal Medicine

## 2020-01-29 NOTE — Telephone Encounter (Signed)
Called pt - stated unable to eat solid foods since August. Weighs 125 lbs. She has an appt with Dr Ardis Hughs 9/24. Only able to drink ensure with water and juices. Stated she is very weak. C/o stomach burning; despite taking Pepcid. Stated she went to the ED 3 weeks ago; but waited so long, she had to leave. Requesting call back from the doctor. Stated she does not know how long she can last w/o food.

## 2020-01-29 NOTE — Telephone Encounter (Signed)
Contacted [name] [name] by phone to discuss her progressive GI symptoms. I reassured her that continuing to take pepcid twice daily as prescribed will continue to help her with her ongoing symptoms. She agrees that when she takes this medication regularly that she feels much better. Additionally, I recommended that she continue to do the best that she can to increase her fluid intake and pureed food diet, remain upright after eating, and to have multiple small meals a day. We discussed that we have attempted to reschedule her appointment with GI sooner, however, her appointment on the 24th of September is the absolute earliest appointment. She understands and is fine with waiting until then to meet with the GI providers. She requested to be seen in our clinic in the interim, so we will schedule her for an appointment as soon as possible for ongoing discussion of her condition.

## 2020-01-29 NOTE — Telephone Encounter (Signed)
Pls contact [phone], pt is in pain and unable to eat

## 2020-01-30 NOTE — Telephone Encounter (Signed)
Called pt - stated she had talked to the doctor about her stomach problems. Dr Wynetta Emery requested to schedule pt an appt - first available will be Friday 9/10 @ 1015AM; pt is agreeable.

## 2020-02-01 ENCOUNTER — Other Ambulatory Visit: Payer: Self-pay

## 2020-02-01 ENCOUNTER — Ambulatory Visit (INDEPENDENT_AMBULATORY_CARE_PROVIDER_SITE_OTHER): Payer: Medicare Other | Admitting: Student

## 2020-02-01 ENCOUNTER — Encounter: Payer: Self-pay | Admitting: Student

## 2020-02-01 DIAGNOSIS — K228 Other specified diseases of esophagus: Secondary | ICD-10-CM

## 2020-02-01 DIAGNOSIS — I10 Essential (primary) hypertension: Secondary | ICD-10-CM

## 2020-02-01 DIAGNOSIS — R198 Other specified symptoms and signs involving the digestive system and abdomen: Secondary | ICD-10-CM

## 2020-02-01 DIAGNOSIS — K2289 Other specified disease of esophagus: Secondary | ICD-10-CM

## 2020-02-01 MED ORDER — FAMOTIDINE 20 MG PO TABS: 20.0000 mg | ORAL_TABLET | Freq: Two times a day (BID) | ORAL | 1 refills | Status: DC

## 2020-02-01 NOTE — Assessment & Plan Note (Addendum)
Continues to have sensation of food getting stuck in her esophagus.  She has not been able to eat solid food for the past month.  Her intake includes soft foods and Ensure mixed with water.  She has lost significant weight due to inability to eat solid foods.  Her recent barium swallow study was normal.  On esophagram showed dysmotility secondary to presbyesophagus. Patient has a GI appointment on September 24.  Patient was educated on the cause of her symptoms and showed imaging of her esophagus and this helped alleviated her anxiety now that she know what is going on with her.  She plans to explain this to her son.  Plan: -Continue soft and liquid foods -Follow-up with GI in 2 weeks. -Continue famotidine 20 mg twice a day

## 2020-02-01 NOTE — Patient Instructions (Signed)
Thank you, [name] for allowing [place] to provide your care today. Today we discussed your stomach symptoms. Continue to eat liquid and soft foods and go to your GI appointment on 9.24.    I have ordered the following medication/changed the following medications:  1. Famotidine (Pepcid) 20 mg take twice a day  Please follow-up in as needed  Should you have any questions or concerns please call the internal medicine clinic at [phone].    Linwood Dibbles, MD, MPH [facility]   My Chart Access: [email or web address]   If you have not already done so, please get your COVID 19 vaccine  To schedule an appointment for a COVID vaccine choice any of the following: Go to [email or web address]   Go to [email or web address]                  Call [phone]                                     Call [phone] and select Option 2

## 2020-02-01 NOTE — Progress Notes (Signed)
   CC: Epigastric pain  HPI:  [name] [name] is a 86 y.o. with PMH of anxiety, GERD, osteoporosis, HTN and presbyesophagus who presented for ongoing symptoms of epigastric pain and esophagus spasm.  Patient states for the last few months, she has had sensation of food getting stuck in her chest causing her anxiety every time she eats solid foods.  Over the last month, she has stopped eating solid foods and only eat soft foods and Ensure.  She has lost significant weight due to her inability to eat.  She reports occasional feeling of fullness and loss of appetite but denies any nausea or vomiting.  She denies any abdominal pain, dizziness, headaches or throat pain.  Patient is worried about her inability to eat and would like to understand why she is having the symptoms and what treatments are available.  Please see problem based charting for evaluation, assessment and plan.  Past Medical History:  Diagnosis Date  . Anemia    iron def  . Anxiety   . Arrhythmia    paroxysmal SVT and symtomatic PAC's  . Diverticulosis    colon  . Elevated troponin   . GERD 04/02/2008   Qualifier: Diagnosis of  By: Jenny Reichmann MD, Hunt Oris   . History of colonic polyps    cecal  . Hypertension   . 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 Osteoporosis 07/21/2011   t score -2.7  . Palpitations 05/24/2017  . Stricture esophagus   . Vitamin D deficiency    2010   Review of Systems:  All ROS negative otherwise as stated in the HPI  Physical Exam:  General: Pleasant elderly woman sitting in chair. No acute distress. Well nourished, well developed. Head: Normocephalic, atraumatic w/o tenderness Cardiac: RRR. No murmurs, rubs or gallops. S1, S2. No lower extremities edema Respiratory: Lungs CTAB. No wheezing or crackles. No increased WOB Abdominal: Soft, symmetric and non tender. No organomegaly. Normal bowel sounds Skin: Warm, dry and intact without rashes  or lesions Extremities: Atraumatic. Full ROM. Pulse palpable. Neuro: A&O x 3. Moves all extremities Psych: Appropriate mood and affect. Normal judgement  Vitals:   02/01/20 1013 02/01/20 1020  BP: (!) 173/81 (!) 163/84  Pulse: 76 77  Temp: 97.6 F (36.4 C)   TempSrc: Oral   SpO2: 98%   Weight: 131 lb (59.4 kg)   Height: 5' 5.5" (1.664 m)     Assessment & Plan:   See Encounters Tab for problem based charting.  Patient discussed with Dr. Michelle Nasuti, MD, MPH

## 2020-02-01 NOTE — Assessment & Plan Note (Signed)
Patient with elevated BP of 173/81 on arrival.  Repeat BP showed 163/84.  Patient has no complaint of dizziness or headaches.  Currently on metoprolol 12.5 mg daily.  Patient would likely need another antihypertensive agent to control her blood pressure.  Plan: -Continue metoprolol 12.5 mg p.o. daily -BP check at next follow-up visit.  Consider adding additional agent if BP still elevated

## 2020-02-03 NOTE — Progress Notes (Signed)
Internal Medicine Clinic Attending  I saw and evaluated the patient.  I personally confirmed the key portions of the history and exam documented by Dr. Amponsah and I reviewed pertinent patient test results.  The assessment, diagnosis, and plan were formulated together and I agree with the documentation in the resident's note.  

## 2020-02-04 ENCOUNTER — Inpatient Hospital Stay (HOSPITAL_COMMUNITY)
Admission: EM | Admit: 2020-02-04 | Discharge: 2020-02-07 | DRG: 392 | Disposition: A | Discharge status: Home Health | Payer: Medicare Other | Attending: Internal Medicine | Admitting: Internal Medicine

## 2020-02-04 ENCOUNTER — Telehealth: Payer: Self-pay

## 2020-02-04 ENCOUNTER — Encounter (HOSPITAL_COMMUNITY): Payer: Self-pay

## 2020-02-04 DIAGNOSIS — R002 Palpitations: Secondary | ICD-10-CM | POA: Diagnosis present

## 2020-02-04 DIAGNOSIS — K224 Dyskinesia of esophagus: Secondary | ICD-10-CM | POA: Diagnosis not present

## 2020-02-04 DIAGNOSIS — R1310 Dysphagia, unspecified: Secondary | ICD-10-CM

## 2020-02-04 DIAGNOSIS — K2970 Gastritis, unspecified, without bleeding: Secondary | ICD-10-CM | POA: Diagnosis present

## 2020-02-04 DIAGNOSIS — R1084 Generalized abdominal pain: Secondary | ICD-10-CM | POA: Diagnosis not present

## 2020-02-04 DIAGNOSIS — R1314 Dysphagia, pharyngoesophageal phase: Secondary | ICD-10-CM | POA: Diagnosis not present

## 2020-02-04 DIAGNOSIS — Z20822 Contact with and (suspected) exposure to covid-19: Secondary | ICD-10-CM | POA: Diagnosis not present

## 2020-02-04 DIAGNOSIS — F419 Anxiety disorder, unspecified: Secondary | ICD-10-CM | POA: Diagnosis present

## 2020-02-04 DIAGNOSIS — R109 Unspecified abdominal pain: Secondary | ICD-10-CM | POA: Diagnosis not present

## 2020-02-04 DIAGNOSIS — I471 Supraventricular tachycardia: Secondary | ICD-10-CM | POA: Diagnosis not present

## 2020-02-04 DIAGNOSIS — K5900 Constipation, unspecified: Secondary | ICD-10-CM | POA: Diagnosis present

## 2020-02-04 DIAGNOSIS — K219 Gastro-esophageal reflux disease without esophagitis: Secondary | ICD-10-CM | POA: Diagnosis present

## 2020-02-04 DIAGNOSIS — R5381 Other malaise: Secondary | ICD-10-CM | POA: Diagnosis not present

## 2020-02-04 DIAGNOSIS — M810 Age-related osteoporosis without current pathological fracture: Secondary | ICD-10-CM | POA: Diagnosis present

## 2020-02-04 DIAGNOSIS — Z888 Allergy status to other drugs, medicaments and biological substances status: Secondary | ICD-10-CM

## 2020-02-04 DIAGNOSIS — D72819 Decreased white blood cell count, unspecified: Secondary | ICD-10-CM | POA: Diagnosis present

## 2020-02-04 DIAGNOSIS — I493 Ventricular premature depolarization: Secondary | ICD-10-CM | POA: Diagnosis present

## 2020-02-04 DIAGNOSIS — K319 Disease of stomach and duodenum, unspecified: Secondary | ICD-10-CM | POA: Diagnosis present

## 2020-02-04 DIAGNOSIS — Z87891 Personal history of nicotine dependence: Secondary | ICD-10-CM

## 2020-02-04 DIAGNOSIS — R200 Anesthesia of skin: Secondary | ICD-10-CM | POA: Diagnosis present

## 2020-02-04 DIAGNOSIS — Z87892 Personal history of anaphylaxis: Secondary | ICD-10-CM

## 2020-02-04 DIAGNOSIS — K228 Other specified diseases of esophagus: Secondary | ICD-10-CM | POA: Diagnosis not present

## 2020-02-04 DIAGNOSIS — R1319 Other dysphagia: Secondary | ICD-10-CM

## 2020-02-04 DIAGNOSIS — I10 Essential (primary) hypertension: Secondary | ICD-10-CM | POA: Diagnosis not present

## 2020-02-04 DIAGNOSIS — R633 Feeding difficulties: Secondary | ICD-10-CM | POA: Diagnosis present

## 2020-02-04 DIAGNOSIS — K2990 Gastroduodenitis, unspecified, without bleeding: Secondary | ICD-10-CM | POA: Diagnosis present

## 2020-02-04 DIAGNOSIS — R634 Abnormal weight loss: Secondary | ICD-10-CM | POA: Diagnosis present

## 2020-02-04 DIAGNOSIS — R52 Pain, unspecified: Secondary | ICD-10-CM | POA: Diagnosis not present

## 2020-02-04 DIAGNOSIS — Z8719 Personal history of other diseases of the digestive system: Secondary | ICD-10-CM

## 2020-02-04 DIAGNOSIS — Z85828 Personal history of other malignant neoplasm of skin: Secondary | ICD-10-CM

## 2020-02-04 LAB — COMPREHENSIVE METABOLIC PANEL
ALT: 12 U/L (ref 0–44)
AST: 21 U/L (ref 15–41)
Albumin: 4.4 g/dL (ref 3.5–5.0)
Alkaline Phosphatase: 58 U/L (ref 38–126)
Anion gap: 14 (ref 5–15)
BUN: 8 mg/dL (ref 8–23)
CO2: 25 mmol/L (ref 22–32)
Calcium: 9.6 mg/dL (ref 8.9–10.3)
Chloride: 102 mmol/L (ref 98–111)
Creatinine, Ser: 0.85 mg/dL (ref 0.44–1.00)
GFR calc Af Amer: >60 mL/min (ref >60)
GFR calc non Af Amer: >60 mL/min (ref >60)
Glucose, Bld: 94 mg/dL (ref 70–99)
Potassium: 4.1 mmol/L (ref 3.5–5.1)
Sodium: 141 mmol/L (ref 135–145)
Total Bilirubin: 0.7 mg/dL (ref 0.3–1.2)
Total Protein: 7.4 g/dL (ref 6.5–8.1)

## 2020-02-04 LAB — CBC
HCT: 40.6 % (ref 36.0–46.0)
Hemoglobin: 13.1 g/dL (ref 12.0–15.0)
MCH: 31.3 pg (ref 26.0–34.0)
MCHC: 32.3 g/dL (ref 30.0–36.0)
MCV: 96.9 fL (ref 80.0–100.0)
Platelets: 253 10*3/uL (ref 150–400)
RBC: 4.19 MIL/uL (ref 3.87–5.11)
RDW: 13.2 % (ref 11.5–15.5)
WBC: 3.9 10*3/uL — ABNORMAL LOW (ref 4.0–10.5)
nRBC: 0 % (ref 0.0–0.2)

## 2020-02-04 LAB — LIPASE, BLOOD: Lipase: 22 U/L (ref 11–51)

## 2020-02-04 NOTE — ED Triage Notes (Addendum)
Pt bib [facility] from home w/ c/o 10/10 R/LUQ abdominal pain. Pt endorses n/v. Pt has known abdominal diagnosis and appt to see GI specialist on 9/24. EMS VSS.

## 2020-02-04 NOTE — Telephone Encounter (Signed)
Received TC from patient.  She states she called EMS this morning because she is unable to eat or drink and c/o abdominal pain w/ esophagus spasm's.  EMS told her she would have a long wait in the ER and she decided not to go, they instructed her to call her MD.  Patient was seen in clinic on 02/01/20.  Pt states she is able to drink water mixed with ensure and is keeping this down, she denies N/V.  She states she has no appetite.  She does c/o abdominal pain, swelling, and "hard abdomen".  She reports her last BM on Saturday and states it was normal.  RN informed patient if she is has abdominal pain, swelling and her abdomen is rigid, she should go to ER for evaluation.  Pt declinies, but states she might go to urgent care.  Patient requested appt to be seen in office, appt given for tomorrow at 1015, red team.  Reiterated to patient if pain continues or worsens, she develops any nausea, vomitting, or dizziness to present to ER and she verbalized understanding. SChaplin, RN,BSN

## 2020-02-05 ENCOUNTER — Other Ambulatory Visit: Payer: Self-pay

## 2020-02-05 ENCOUNTER — Telehealth: Payer: Self-pay

## 2020-02-05 ENCOUNTER — Encounter (HOSPITAL_COMMUNITY)
Admission: EM | Disposition: A | Discharge status: Home Health | Payer: Self-pay | Source: Home / Self Care | Attending: Internal Medicine

## 2020-02-05 ENCOUNTER — Observation Stay (HOSPITAL_COMMUNITY): Payer: Medicare Other | Admitting: Anesthesiology

## 2020-02-05 ENCOUNTER — Encounter (HOSPITAL_COMMUNITY): Payer: Self-pay | Admitting: Internal Medicine

## 2020-02-05 ENCOUNTER — Encounter: Payer: Medicare Other | Admitting: Internal Medicine

## 2020-02-05 DIAGNOSIS — K2950 Unspecified chronic gastritis without bleeding: Secondary | ICD-10-CM | POA: Diagnosis not present

## 2020-02-05 DIAGNOSIS — K2970 Gastritis, unspecified, without bleeding: Secondary | ICD-10-CM

## 2020-02-05 DIAGNOSIS — I471 Supraventricular tachycardia: Secondary | ICD-10-CM | POA: Diagnosis not present

## 2020-02-05 DIAGNOSIS — K228 Other specified diseases of esophagus: Secondary | ICD-10-CM | POA: Diagnosis not present

## 2020-02-05 DIAGNOSIS — R002 Palpitations: Secondary | ICD-10-CM | POA: Diagnosis not present

## 2020-02-05 DIAGNOSIS — R633 Feeding difficulties: Secondary | ICD-10-CM | POA: Diagnosis not present

## 2020-02-05 DIAGNOSIS — R1013 Epigastric pain: Secondary | ICD-10-CM

## 2020-02-05 DIAGNOSIS — I493 Ventricular premature depolarization: Secondary | ICD-10-CM | POA: Diagnosis not present

## 2020-02-05 DIAGNOSIS — D509 Iron deficiency anemia, unspecified: Secondary | ICD-10-CM | POA: Diagnosis not present

## 2020-02-05 DIAGNOSIS — K224 Dyskinesia of esophagus: Secondary | ICD-10-CM | POA: Diagnosis not present

## 2020-02-05 DIAGNOSIS — I10 Essential (primary) hypertension: Secondary | ICD-10-CM | POA: Diagnosis not present

## 2020-02-05 DIAGNOSIS — R109 Unspecified abdominal pain: Secondary | ICD-10-CM | POA: Diagnosis not present

## 2020-02-05 DIAGNOSIS — E785 Hyperlipidemia, unspecified: Secondary | ICD-10-CM | POA: Diagnosis not present

## 2020-02-05 DIAGNOSIS — R1310 Dysphagia, unspecified: Secondary | ICD-10-CM | POA: Diagnosis not present

## 2020-02-05 DIAGNOSIS — M810 Age-related osteoporosis without current pathological fracture: Secondary | ICD-10-CM | POA: Diagnosis not present

## 2020-02-05 DIAGNOSIS — R1314 Dysphagia, pharyngoesophageal phase: Secondary | ICD-10-CM | POA: Diagnosis not present

## 2020-02-05 DIAGNOSIS — K219 Gastro-esophageal reflux disease without esophagitis: Secondary | ICD-10-CM | POA: Diagnosis not present

## 2020-02-05 DIAGNOSIS — K2990 Gastroduodenitis, unspecified, without bleeding: Secondary | ICD-10-CM | POA: Diagnosis not present

## 2020-02-05 DIAGNOSIS — Z20822 Contact with and (suspected) exposure to covid-19: Secondary | ICD-10-CM | POA: Diagnosis not present

## 2020-02-05 DIAGNOSIS — F419 Anxiety disorder, unspecified: Secondary | ICD-10-CM | POA: Diagnosis not present

## 2020-02-05 DIAGNOSIS — R634 Abnormal weight loss: Secondary | ICD-10-CM | POA: Diagnosis not present

## 2020-02-05 HISTORY — PX: ESOPHAGEAL DILATION: SHX303

## 2020-02-05 HISTORY — PX: BIOPSY: SHX5522

## 2020-02-05 HISTORY — PX: ESOPHAGOGASTRODUODENOSCOPY (EGD) WITH PROPOFOL: SHX5813

## 2020-02-05 LAB — URINALYSIS, ROUTINE W REFLEX MICROSCOPIC
Bilirubin Urine: NEGATIVE
Glucose, UA: NEGATIVE mg/dL
Hgb urine dipstick: NEGATIVE
Ketones, ur: 20 mg/dL — AB
Leukocytes,Ua: NEGATIVE
Nitrite: NEGATIVE
Protein, ur: NEGATIVE mg/dL
Specific Gravity, Urine: 1.013 (ref 1.005–1.030)
pH: 6 (ref 5.0–8.0)

## 2020-02-05 LAB — SARS CORONAVIRUS 2 BY RT PCR (HOSPITAL ORDER, PERFORMED IN ~~LOC~~ HOSPITAL LAB): SARS Coronavirus 2: NEGATIVE

## 2020-02-05 SURGERY — ESOPHAGOGASTRODUODENOSCOPY (EGD) WITH PROPOFOL
Anesthesia: Monitor Anesthesia Care

## 2020-02-05 MED ORDER — FLECAINIDE ACETATE 50 MG PO TABS: 50.0000 mg | ORAL_TABLET | ORAL | Status: DC

## 2020-02-05 MED ORDER — PROPOFOL 10 MG/ML IV BOLUS
INTRAVENOUS | Status: DC | PRN
  Administered 2020-02-05: 20 mg via INTRAVENOUS

## 2020-02-05 MED ORDER — ACETAMINOPHEN 650 MG RE SUPP: 650.0000 mg | Freq: Four times a day (QID) | RECTAL | Status: DC | PRN

## 2020-02-05 MED ORDER — FAMOTIDINE 20 MG PO TABS
20.0000 mg | ORAL_TABLET | Freq: Once | ORAL | Status: AC
  Administered 2020-02-05: 20 mg via ORAL
  Filled 2020-02-05: qty 1

## 2020-02-05 MED ORDER — LACTATED RINGERS IV SOLN: INTRAVENOUS | Status: DC

## 2020-02-05 MED ORDER — LIDOCAINE VISCOUS HCL 2 % MT SOLN
15.0000 mL | Freq: Once | OROMUCOSAL | Status: AC
  Administered 2020-02-05: 15 mL via OROMUCOSAL
  Filled 2020-02-05: qty 15

## 2020-02-05 MED ORDER — ENOXAPARIN SODIUM 40 MG/0.4ML ~~LOC~~ SOLN
40.0000 mg | SUBCUTANEOUS | Status: DC
  Administered 2020-02-05 – 2020-02-07 (×2): 40 mg via SUBCUTANEOUS
  Filled 2020-02-05 (×3): qty 0.4

## 2020-02-05 MED ORDER — FAMOTIDINE 20 MG PO TABS
20.0000 mg | ORAL_TABLET | Freq: Two times a day (BID) | ORAL | Status: DC
  Administered 2020-02-06 – 2020-02-07 (×2): 20 mg via ORAL
  Filled 2020-02-05 (×4): qty 1

## 2020-02-05 MED ORDER — FLECAINIDE ACETATE 50 MG PO TABS
50.0000 mg | ORAL_TABLET | Freq: Every evening | ORAL | Status: DC
  Administered 2020-02-06: 50 mg via ORAL
  Filled 2020-02-05 (×3): qty 1

## 2020-02-05 MED ORDER — FLECAINIDE ACETATE 100 MG PO TABS
100.0000 mg | ORAL_TABLET | Freq: Every day | ORAL | Status: DC
  Administered 2020-02-06 – 2020-02-07 (×2): 100 mg via ORAL
  Filled 2020-02-05 (×4): qty 1

## 2020-02-05 MED ORDER — ACETAMINOPHEN 325 MG PO TABS: 650.0000 mg | ORAL_TABLET | Freq: Four times a day (QID) | ORAL | Status: DC | PRN

## 2020-02-05 MED ORDER — PROPOFOL 500 MG/50ML IV EMUL
INTRAVENOUS | Status: DC | PRN
  Administered 2020-02-05: 100 ug/kg/min via INTRAVENOUS

## 2020-02-05 MED ORDER — LIDOCAINE 2% (20 MG/ML) 5 ML SYRINGE
INTRAMUSCULAR | Status: DC | PRN
  Administered 2020-02-05: 60 mg via INTRAVENOUS

## 2020-02-05 SURGICAL SUPPLY — 14 items

## 2020-02-05 NOTE — Op Note (Signed)
[facility] Patient Name: [name] [name] Procedure Date : 02/05/2020 MRN: [id number] Attending MD: Thornton Park MD, MD Date of Birth: 10/07/1929 CSN: [id number] Age: 86 Admit Type: Inpatient Procedure:                Upper GI endoscopy Indications:              Post-prandial epigastric pain/pressure, dysphagia,                            and esophageal spasm. She feels like any solids or                            liquids moves slowly through her chest and may get                            "caught up." She is followed by Dr. Lovena Le, and her                            symptoms have previously been thought to be related                            to her PACs and PVCs treated with flecainide                            withToprol XL PRN palpitations. There is now food                            avoidance and a 20 pound weight loss. Her diet is                            mostly soft foods and Ensure. GI ROS is otherwise                            negative except for constipation managed with prune                            juice. Prior evaluation over the year including                            UGI, MBSS, and CT abd/pelvis has revealed moderate                            presbyesophagus and a duodenal tic. There is no                            stricture, HH or GERD. Symptoms have not improved                            on PPI BID. She had a prior anaphylactic reaction                            to a GI cocktail. No prior EGD. Providers:  Thornton Park MD, MD, Benetta Spar RN, RN,                            Tyrone Apple, Technician, Mora Appl, CRNA Referring MD:              Medicines:                Monitored Anesthesia Care Complications:            No immediate complications. Estimated blood loss:                            Minimal. Estimated Blood Loss:     Estimated blood loss was minimal. Procedure:                Pre-Anesthesia Assessment:                            - Prior to the procedure, a History and Physical                            was performed, and patient medications and                            allergies were reviewed. The patient's tolerance of                            previous anesthesia was also reviewed. The risks                            and benefits of the procedure and the sedation                            options and risks were discussed with the patient.                            All questions were answered, and informed consent                            was obtained. Prior Anticoagulants: The patient has                            taken Lovenox (enoxaparin), last dose was day of                            procedure. ASA Grade Assessment: III - A patient                            with severe systemic disease. After reviewing the                            risks and benefits, the patient was deemed in                            satisfactory condition to undergo the procedure.  After obtaining informed consent, the endoscope was                            passed under direct vision. Throughout the                            procedure, the patient's blood pressure, pulse, and                            oxygen saturations were monitored continuously. The                            GIF-H190 ([id number]) Olympus gastroscope was                            introduced through the mouth, and advanced to the                            third part of duodenum. The upper GI endoscopy was                            accomplished without difficulty. The patient                            tolerated the procedure well. Scope In: Scope Out: Findings:      Initially there appeared to be a ring in the distal esophagus, but, on       further evaluation no ring was seen. The examined esophagus was normal.       A TTS dilator was passed through the scope. Dilation with a 15-16.5-18       mm  balloon dilator was performed to 18 mm with no significant       resistance. The balloon was pulled through the esophagus without       formation of a rent. The dilation site was examined and showed no       change. Biopsies were taken from the mid/proximal and distal esophagus       with a cold forceps for histology. Estimated blood loss was minimal.      The gastric mucosa has a slightly verrucous appearance. Some increased       granularity. Findings were most pronounced in the fundus. Biopsies were       taken from the antrum, body, and fundus with a cold forceps for       histology. Estimated blood loss was minimal.      The examined duodenum was normal. Impression:               - Normal esophagus. Dilated. Biopsied.                           - Gastropathy. Biopsied.                           - Normal examined duodenum. Recommendation:           - Return patient to hospital [facility] for ongoing care.                           -  Advance diet as tolerated.                           - Continue present medications. Continue PPI BID to                            treat possible reflux-related dysmotility.                           - Consider trial of peppermint Altoids prior to                            eating to minimize post-prandial symptoms on                            discharge. Place two altoids in the mouth. Allow                            the tablets to dissolve in the mouth and then                            swallow the resultant fluid to bathe the esophagus                            with peppermint.                           - Await pathology results.                           - Follow-up in 4-6 weeks. Consider outpatient                            esophageal manometry if symptoms persist. (I will                            arrange follow-up)                           Results and recommendations were reviewed with the                            patient. All questions were  answered to her                            satisfaction.                           The inpatient GI team will move to stand-by. Please                            call the on-call gastroenterologist with any                            additional questions or concerns during this  hospitalization. Procedure Code(s):        --- Professional ---                           [phone], Esophagogastroduodenoscopy, flexible,                            transoral; with transendoscopic balloon dilation of                            esophagus (less than 30 mm diameter)                           43239, 59,51, Esophagogastroduodenoscopy, flexible,                            transoral; with biopsy, single or multiple Diagnosis Code(s):        --- Professional ---                           K29.70, Gastritis, unspecified, without bleeding                           R13.10, Dysphagia, unspecified CPT copyright 2019 American Medical Association. All rights reserved. The codes documented in this report are preliminary and upon coder review may  be revised to meet current compliance requirements. Thornton Park MD, MD 02/05/2020 2:55:41 PM This report has been signed electronically. Number of Addenda: 0

## 2020-02-05 NOTE — Transfer of Care (Signed)
Immediate Anesthesia Transfer of Care Note  Patient: [name] [name]  Procedure(s) Performed: ESOPHAGOGASTRODUODENOSCOPY (EGD) WITH PROPOFOL (N/A ) BIOPSY ESOPHAGEAL DILATION  Patient Location: Endoscopy Unit  Anesthesia Type:MAC  Level of Consciousness: drowsy, patient cooperative and responds to stimulation  Airway & Oxygen Therapy: Patient Spontanous Breathing  Post-op Assessment: Report given to RN and Post -op Vital signs reviewed and stable  Post vital signs: Reviewed and stable  Last Vitals:  Vitals Value Taken Time  BP 95/46 02/05/20 1432  Temp    Pulse 77 02/05/20 1432  Resp 14 02/05/20 1432  SpO2 98 % 02/05/20 1432  Vitals shown include unvalidated device data.  Last Pain:  Vitals:   02/05/20 1302  TempSrc: Oral  PainSc: 0-No pain         Complications: No complications documented.

## 2020-02-05 NOTE — H&P (Signed)
Date: 02/05/2020               Patient Name:  [name] [name] MRN: [id number]  DOB: Nov 03, 1929 Age / Sex: 86 y.o., female   PCP: [name], MD         Medical Service: Internal Medicine Teaching Service         Attending Physician: Dr. Jimmye Norman, Elaina Pattee, MD    First Contact: Dr. Candie Chroman Pager: [phone]  Second Contact: Dr. [name] Pager: [phone]       After Hours (After 5p/  First Contact Pager: [phone]  weekends / holidays): Second Contact Pager: [phone]   Chief Complaint: Abdominal discomfort, weight loss  History of Present Illness: This is a 86 year old female with a history of anxiety, GERD, osteoporosis, hypertension, PACs, PVCs (well controlled on fleicanide) and presbyesophagus who presented with worsening abdominal discomfort and palpitations.  She tried eating food this morning and her upper stomach started feeling like it was jerking and she started having palpitations, she called 911 and on evaluation her vitals were stable, however she was advised to come into the ER since it could be related to her heart.  She reports that since July she has been having difficulty eating, states that every time she eats she will get full very easily, feels like food is getting stuck in her upper abdomen, also has some epigastric numbness when this happens.  She has not been able to tolerate much food, only eating 1 to 2 ounces of cereal per day and watered-down Ensure.  Also endorses weight loss, weight decreased from 62kg to 56 kg in 1 month.  Denies any issues swallowing water.  She denies any abdominal pain, nausea, vomiting, fevers, chills, headaches, lightheadedness, dizziness, fatigue, diarrhea, constipation.  Reports her last bowel movement was about 2 days ago.  She has been taking her flecainide and Pepcid.  On chart review patient was seen by GI on 12/14/19, continued to have palpitations and epigastric/chest pain and feelings like food was getting stuck on the way down.  Felt that  her symptoms could be more related to her SVT, and obtained upper GI with barium swallow.  Upper GI series showed moderate esophageal dysmotility, presbyesophagus. Omeprazole was sent in and she had a follow up scheduled with GI.   In the ED, patient is afebrile, heart rate, respiratory rate and blood pressure are WNL.  Covid test negative.  Lipase 22.  CMP unremarkable.  CBC showed a mild leukopenia at 3.9.  Patient given Pepcid and lidocaine solution.  EDP spoke with GI who will come and evaluate.  Admitted to IMTS.  Meds:  Current Meds  Medication Sig  . aspirin EC 325 MG tablet Take 325 mg by mouth daily as needed (headache).  Marland Kitchen Cod Liver Oil CAPS Take 1 capsule by mouth daily.  . famotidine (PEPCID) 20 MG tablet Take 1 tablet (20 mg total) by mouth 2 (two) times daily.  . flecainide (TAMBOCOR) 50 MG tablet Take 1 tablet (50 mg total) by mouth See admin instructions. Take 2 tablets (100 mg) by mouth every morning and 1 tablet (50 mg) at night  . LORazepam (ATIVAN) 0.5 MG tablet Take 0.5 tablets (0.25 mg total) by mouth daily as needed for anxiety.     Allergies: Allergies as of 02/04/2020 - Review Complete 02/04/2020  Allergen Reaction Noted  . Other Anaphylaxis 07/22/2017  . Pantoprazole Other (See Comments) 07/22/2017  . Zyrtec [cetirizine] Palpitations 04/11/2014  . Ciprofloxacin  03/09/2019  Past Medical History:  Diagnosis Date  . Anemia    iron def  . Anxiety   . Arrhythmia    paroxysmal SVT and symtomatic PAC's  . Diverticulosis    colon  . Elevated troponin   . GERD 04/02/2008   Qualifier: Diagnosis of  By: Jenny Reichmann MD, Hunt Oris   . History of colonic polyps    cecal  . Hypertension   . 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 Osteoporosis 07/21/2011   t score -2.7  . Palpitations 05/24/2017  . Stricture esophagus   . Vitamin D deficiency    2010    Family History: Sister had a GI issue and a "small throat".   Denies any other significant family history.  Social History: Denies smoking, drinking, or alcohol use.  Lives alone, son lives very close and is over her every day.  Worked as a Theatre manager, currently retired.  Review of Systems: A complete ROS was negative except as per HPI.   Physical Exam: Blood pressure (!) 124/111, pulse 76, temperature 98.3 F (36.8 C), temperature source Oral, resp. rate 18, height 5' 5.5" (1.664 m), weight 56.2 kg, last menstrual period 05/24/1970, SpO2 100 %. Physical Exam Constitutional:      Appearance: She is well-developed.     Comments: Frail appearing  HENT:     Head: Normocephalic and atraumatic.     Mouth/Throat:     Mouth: Mucous membranes are moist.     Pharynx: Oropharynx is clear.  Cardiovascular:     Rate and Rhythm: Normal rate and regular rhythm.     Heart sounds: No murmur heard.   Pulmonary:     Effort: Pulmonary effort is normal.     Breath sounds: Normal breath sounds.  Abdominal:     General: Abdomen is flat. Bowel sounds are normal. There is no distension.     Palpations: Abdomen is soft.     Tenderness: There is no abdominal tenderness. There is no guarding or rebound.     Hernia: No hernia is present.  Skin:    General: Skin is warm and dry.     Capillary Refill: Capillary refill takes less than 2 seconds.  Neurological:     General: No focal deficit present.     Mental Status: She is alert and oriented to person, place, and time.  Psychiatric:        Mood and Affect: Mood normal.        Behavior: Behavior normal.      EKG: personally reviewed my interpretation is NSR, no acute ST changes, no PVC or PACs noted   Assessment & Plan by Problem: Active Problems:   Odynophagia  This is a 86 year old female with a history of anxiety, GERD, osteoporosis, hypertension, PACs, PVCs (well controlled on fleicanide) and presbyesophagus who presented with worsening abdominal discomfort and palpitations.    Dysphagia/Odynophagia: Palpitations: Patient presenting with a 2 month history of difficulty swallowing foods and thick liquids, abdominal discomfort, weight loss of at least 6 kg over 1 month, and palpitations.  Chart review appears that she has been having similar symptoms for few years now and also felt that it may be related to her PACs and PVCs.  Underwent swallow study on 8/6 that showed normal swallowing function, no aspiration noted. Upper GI series on 12/25/19 showed moderate prebyesophagus. She had a follow up scheduled with GI later this month to discuss next steps however she  continued to have symptoms so she came into the ER.  Her vitals are stable today. CBC, CMP, and lipase all WNL.  No signs of infection. Symptoms appear to be worsened by her palpitations and anxiety.   -GI following, appreciate recommendations, planning EGD -N.p.o. -Monitor CBC and BMP -Trend fever curve -Dietary consult  PACs/PVCs: Well-controlled on flecainide, heart rate 70s..  She had been started on metoprolol daily however patient states that she is not taking this.  No palpitations at this time.  Will resume flecainide when patient started on a diet.  Anxiety: Patient occasionally will take low-dose Ativan as needed, states she only took about 2 tablets for the past 10 days. There was consideration before about starting a SSRI however patient is on flecainide. Denies any anxiety at this time. Can do low dose ativan PRN.   Hypertension: Blood pressure well controlled at this time, will continue to monitor for now.  Hold off on restarting home medications.  Dispo: Admit patient to Observation with expected length of stay less than 2 midnights.  Signed: Asencion Noble, MD 02/05/2020, 8:18 AM  Pager: [phone] After 5pm on weekdays and 1pm on weekends: On Call pager: [phone]

## 2020-02-05 NOTE — Progress Notes (Signed)
Pt was seen for initial mobility and noted her struggle with balance and endurance in contrast to recent independence.  Talked with her about having family help at home and she is positive about getting extra assistance from them, esp as her son lives next door to her.  Recommending home therapy and will work with her on use of AD to control minor balance changes resulting from poor intake and inability to move comfortably.    02/05/20 1300  PT Visit Information  Last PT Received On 02/05/20  Assistance Needed +1  History of Present Illness 86 yo female with onset of abd pain with worsening and palpitations was admitted through ED.  Has spasmed feelings in abd, cannot swallow food well with recent significant weight loss per chart.  Noted on chart dysphagia/odynophagia, anxiety, palpitations.  PMHx:  TIA, SVT, PAC's, PVC's  Precautions  Precautions Fall  Restrictions  Weight Bearing Restrictions No  Home Living  Family/patient expects to be discharged to: Private residence  Living Arrangements Alone  Available Help at Discharge Family;Available PRN/intermittently  Type of Home House  Home Access Stairs to enter  Entrance Stairs-Number of Steps 2+2  Entrance Stairs-Rails None  Home Layout Two level  Alternate Level Stairs-Number of Steps 12  Alternate Level Stairs-Rails Right;Left  Tax adviser - 4 wheels;Cane - single point  Additional Comments used cane outdoors  Prior Function  Level of Independence Independent  Comments not using AD typically  Communication  Communication No difficulties  Pain Assessment  Pain Assessment No/denies pain  Cognition  Arousal/Alertness Awake/alert  Behavior During Therapy WFL for tasks assessed/performed  Overall Cognitive Status Within Functional Limits for tasks assessed  Upper Extremity Assessment  Upper Extremity Assessment Overall WFL for tasks assessed  Lower Extremity Assessment  Lower Extremity  Assessment Overall WFL for tasks assessed  Cervical / Trunk Assessment  Cervical / Trunk Assessment Kyphotic (mild)  Bed Mobility  Overal bed mobility Needs Assistance  Bed Mobility Supine to Sit;Sit to Supine  Supine to sit Min guard  Sit to supine Min guard  General bed mobility comments extra time and cues for safety  Transfers  Overall transfer level Needs assistance  Equipment used 1 person hand held assist  Transfers Sit to/from Stand  Sit to Stand Min guard  General transfer comment min guard for safety  Ambulation/Gait  Ambulation/Gait assistance Min guard  Gait Distance (Feet) 150 Feet  Assistive device 1 person hand held assist  Gait Pattern/deviations Step-through pattern;Decreased stride length  General Gait Details walking with care but is occas having to steady herself  Gait velocity reduced  Gait velocity interpretation <1.31 ft/sec, indicative of household ambulator  Balance  Overall balance assessment Needs assistance  Sitting-balance support Feet supported  Sitting balance-Leahy Scale Good  Standing balance support Single extremity supported  Standing balance-Leahy Scale Fair  Standing balance comment less than fair at times  General Comments  General comments (skin integrity, edema, etc.) Pt will likely need to use an assistive device now, has decline related to her wgt loss   Exercises  Exercises Other exercises (LE strength is 4-5/5)  PT - End of Session  Activity Tolerance Patient limited by fatigue;Treatment limited secondary to medical complications (Comment)  Patient left in bed;with call bell/phone within reach  Nurse Communication Mobility status  PT Assessment  PT Recommendation/Assessment Patient needs continued PT services  PT Visit Diagnosis Unsteadiness on feet (R26.81);Muscle weakness (generalized) (M62.81);Difficulty in walking, not elsewhere classified (R26.2)  PT  Problem List Decreased strength;Decreased activity tolerance;Decreased  balance  Barriers to Discharge Inaccessible home environment  Barriers to Discharge Comments has stairs inside and out of house  PT Plan  PT Frequency (ACUTE ONLY) Min 3X/week  PT Treatment/Interventions (ACUTE ONLY) DME instruction;Gait training;Stair training;Functional mobility training;Therapeutic activities;Therapeutic exercise;Balance training;Neuromuscular re-education;Patient/family education  AM-PAC PT "6 Clicks" Mobility Outcome Measure (Version 2)  Help needed turning from your back to your side while in a flat bed without using bedrails? 4  Help needed moving from lying on your back to sitting on the side of a flat bed without using bedrails? 3  Help needed moving to and from a bed to a chair (including a wheelchair)? 3  Help needed standing up from a chair using your arms (e.g., wheelchair or bedside chair)? 3  Help needed to walk in hospital room? 3  Help needed climbing 3-5 steps with a railing?  2  6 Click Score 18  Consider Recommendation of Discharge To: Home with [facility]  PT Recommendation  Follow Up Recommendations Home health PT;Supervision for mobility/OOB  PT equipment Rolling walker with 5" wheels  Individuals Consulted  Consulted and Agree with Results and Recommendations Patient  Acute Rehab PT Goals  Patient Stated Goal to get better and eat again, regain lost wgt  PT Goal Formulation With patient  Time For Goal Achievement 02/19/20  Potential to Achieve Goals Good  PT Time Calculation  PT Start Time (ACUTE ONLY) 1124  PT Stop Time (ACUTE ONLY) 1150  PT Time Calculation (min) (ACUTE ONLY) 26 min  PT General Charges  $$ ACUTE PT VISIT 1 Visit  PT Evaluation  $PT Eval Moderate Complexity 1 Mod  PT Treatments  $Gait Training 8-22 mins  Written Expression  Dominant Hand Right    Mee Hives, PT MS Acute Rehab Dept. Number: [place] and [place]

## 2020-02-05 NOTE — Telephone Encounter (Signed)
The pt already has a follow up appt on 9/24 with Dr Ardis Hughs.  Dr Ardis Hughs is this ok or should we push it out?

## 2020-02-05 NOTE — ED Provider Notes (Signed)
[facility] EMERGENCY DEPARTMENT Provider Note   CSN: [id number] Arrival date & time: 02/04/20  1524     History Chief Complaint  Patient presents with  . Abdominal Pain    [name] [name] is a 86 y.o. female.   Abdominal Pain Pain location:  Epigastric Pain quality: aching and sharp   Pain radiates to:  Epigastric region Pain severity:  Mild Duration:  3 weeks Timing:  Constant Progression:  Worsening Chronicity:  Recurrent Relieved by:  None tried Worsened by:  Nothing Ineffective treatments:  None tried Associated symptoms: no fever and no shortness of breath        Past Medical History:  Diagnosis Date  . Anemia    iron def  . Anxiety   . Arrhythmia    paroxysmal SVT and symtomatic PAC's  . Diverticulosis    colon  . Elevated troponin   . GERD 04/02/2008   Qualifier: Diagnosis of  By: Jenny Reichmann MD, Hunt Oris   . History of colonic polyps    cecal  . Hypertension   . 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 Osteoporosis 07/21/2011   t score -2.7  . Palpitations 05/24/2017  . Stricture esophagus   . Vitamin D deficiency    2010    Patient Active Problem List   Diagnosis Date Noted  . Presbyesophagus 01/17/2020  . Difficulty swallowing pills 08/17/2019  . Sore throat 08/13/2019  . Varicose veins of both legs with edema 06/04/2019  . Hyperlipidemia 06/04/2019  . History of repair of rectocele 03/13/2019  . Allergic reaction caused by a drug 03/13/2019  . Rotator cuff arthropathy, right 04/08/2016  . Arthritis of knee 03/25/2016  . Presbycusis of both ears 01/15/2016  . Tinnitus of right ear 01/15/2016  . Skin cancer of arm 09/26/2014  . Palpitation 09/28/2012  . Rhinitis 12/25/2011  . History of TIA (transient ischemic attack) 07/19/2011  . ATRIAL PREMATURE BEATS 03/20/2009  . ANEMIA-IRON DEFICIENCY 04/02/2008  . Anxiety 04/02/2008  . Essential hypertension 04/02/2008  .  OSTEOPOROSIS 04/02/2008    Past Surgical History:  Procedure Laterality Date  . A&P, enterocele repair     s/p 2003  . ABDOMINAL HYSTERECTOMY    . BREAST REDUCTION SURGERY     s/p 1996      PATIENT DENIES BREAST REDUCTION   . HEMORRHOID SURGERY    . LEFT HEART CATH AND CORONARY ANGIOGRAPHY N/A 05/25/2017   Procedure: LEFT HEART CATH AND CORONARY ANGIOGRAPHY;  Surgeon: Jettie Booze, MD;  Location: [facility];  Service: Cardiovascular;  Laterality: N/A;     OB History    Gravida  2   Para  2   Term      Preterm      AB      Living  1     SAB      TAB      Ectopic      Multiple      Live Births              Family History  Problem Relation Age of Onset  . Stroke Father        @ 29  . Hypertension Father   . Hypertension Sister   . Cancer Sister 40       brain  . Breast cancer Other 42       breast  . Breast cancer Daughter 18  spread to her brain  . Cancer Other 58       colon/MELANOMA  . Stroke Mother        medication induced  . Kidney disease Brother   . Cancer Brother     Social History   Tobacco Use  . Smoking status: Former Smoker    Quit date: 05/24/1968    Years since quitting: 51.7  . Smokeless tobacco: Never Used  . Tobacco comment: only smoked 7 years  Vaping Use  . Vaping Use: Never used  Substance Use Topics  . Alcohol use: No    Alcohol/week: 0.0 standard drinks  . Drug use: No    Home Medications Prior to Admission medications   Medication Sig Start Date End Date Taking? Authorizing Provider  aspirin EC 325 MG tablet Take 325 mg by mouth daily as needed (headache).    [provider]  [facility] Liver Oil CAPS Take 1 capsule by mouth daily.    [provider]  famotidine (PEPCID) 20 MG tablet Take 1 tablet (20 mg total) by mouth 2 (two) times daily. 02/01/20   Lacinda Axon, MD  flecainide (TAMBOCOR) 50 MG tablet Take 1 tablet (50 mg total) by mouth See admin instructions. Take 2 tablets  (100 mg) by mouth every morning and 1 tablet (50 mg) at night 08/02/19   Baldwin Jamaica, PA-C  LORazepam (ATIVAN) 0.5 MG tablet Take 0.5 tablets (0.25 mg total) by mouth daily as needed for anxiety. 01/16/20 01/15/21  Asencion Noble, MD  metoprolol succinate (TOPROL XL) 25 MG 24 hr tablet Take 0.5 tablets (12.5 mg total) by mouth daily. 12/31/19   Baldwin Jamaica, PA-C  omeprazole (PRILOSEC) 40 MG capsule Take 1 capsule (40 mg total) by mouth in the morning and at bedtime. 01/02/20 04/01/20  Levin Erp, PA    Allergies    Other, Pantoprazole, Zyrtec [cetirizine], and Ciprofloxacin  Review of Systems   Review of Systems  Constitutional: Negative for fever.  Respiratory: Negative for shortness of breath.   Gastrointestinal: Positive for abdominal pain.  All other systems reviewed and are negative.   Physical Exam Updated Vital Signs BP 129/69   Pulse 65   Temp 98.3 F (36.8 C) (Oral)   Resp 12   Ht 5' 5.5" (1.664 m)   Wt 56.2 kg   LMP 05/24/1970   SpO2 98%   BMI 20.32 kg/m   Physical Exam Vitals and nursing note reviewed.  Constitutional:      Appearance: She is well-developed.  HENT:     Head: Normocephalic and atraumatic.     Mouth/Throat:     Mouth: Mucous membranes are moist.     Pharynx: Oropharynx is clear.  Eyes:     Pupils: Pupils are equal, round, and reactive to light.  Cardiovascular:     Rate and Rhythm: Normal rate and regular rhythm.  Pulmonary:     Effort: No respiratory distress.     Breath sounds: No stridor.  Abdominal:     General: There is no distension.  Musculoskeletal:        General: No swelling or tenderness. Normal range of motion.     Cervical back: Normal range of motion.  Skin:    General: Skin is warm and dry.  Neurological:     General: No focal deficit present.     Mental Status: She is alert.     ED Results / Procedures / Treatments   Labs (all labs ordered are  listed, but only abnormal results are  displayed) Labs Reviewed  CBC - Abnormal; Notable for the following components:      Result Value   WBC 3.9 (*)    All other components within normal limits  LIPASE, BLOOD  COMPREHENSIVE METABOLIC PANEL  URINALYSIS, ROUTINE W REFLEX MICROSCOPIC    EKG None  Radiology No results found.  Procedures Procedures (including critical care time)  Medications Ordered in ED Medications  lidocaine (XYLOCAINE) 2 % viscous mouth solution 15 mL (has no administration in time range)  famotidine (PEPCID) tablet 20 mg (has no administration in time range)    ED Course  I have reviewed the triage vital signs and the nursing notes.  Pertinent labs & imaging results that were available during my care of the patient were reviewed by me and considered in my medical decision making (see chart for details).    MDM Rules/Calculators/A&P                         Here with worsening odynophagia and dysphagia. Sees dr. Ardis Hughs on 24th for presbyesophagus. Will consult GI to see if she would be a candidate to stay in hospital for endoscopy or other possibly workup. Lidocaine/pepcid in mean time. Discussed with Dr. Lyndel Safe who agrees with observation in hospital, they will consult in AM for possible EGD. Discussed with IM, will see for admission.   Final Clinical Impression(s) / ED Diagnoses Final diagnoses:  None    Rx / DC Orders ED Discharge Orders    None       Lakenya Riendeau, Corene Cornea, MD 02/06/20 [phone]

## 2020-02-05 NOTE — ED Notes (Signed)
Consent signed for EGD 

## 2020-02-05 NOTE — Anesthesia Preprocedure Evaluation (Addendum)
Anesthesia Evaluation  Patient identified by MRN, date of birth, ID band Patient awake    Reviewed: Allergy & Precautions, NPO status , Patient's Chart, lab work & pertinent test results  Airway Mallampati: II  TM Distance: >3 FB Neck ROM: Full    Dental no notable dental hx.    Pulmonary former smoker,    Pulmonary exam normal        Cardiovascular Exercise Tolerance: Good hypertension,  Rhythm:Regular Rate:Normal     Neuro/Psych Anxiety negative neurological ROS     GI/Hepatic Neg liver ROS, GERD  ,dysphagia   Endo/Other    Renal/GU negative Renal ROS     Musculoskeletal  (+) Arthritis ,   Abdominal Normal abdominal exam  (+)   Peds  Hematology  (+) anemia ,   Anesthesia Other Findings   Reproductive/Obstetrics                           Anesthesia Physical Anesthesia Plan  ASA: III  Anesthesia Plan: MAC   Post-op Pain Management:    Induction: Intravenous  PONV Risk Score and Plan: 2 and Propofol infusion, TIVA and Treatment may vary due to age or medical condition  Airway Management Planned: Natural Airway and Nasal Cannula  Additional Equipment:   Intra-op Plan:   Post-operative Plan:   Informed Consent: I have reviewed the patients History and Physical, chart, labs and discussed the procedure including the risks, benefits and alternatives for the proposed anesthesia with the patient or authorized representative who has indicated his/her understanding and acceptance.       Plan Discussed with:   Anesthesia Plan Comments:         Anesthesia Quick Evaluation

## 2020-02-05 NOTE — Consult Note (Signed)
[facility] Consult: 8:48 AM 02/05/2020  LOS: 0 days    Referring Provider: Dr Dorian Pod  Primary Care Physician:  [name], MD Primary Gastroenterologist:  Dr. Ardis Hughs    Reason for Consultation:  Dysphagia.     HPI: [name] [name] is a 86 y.o. female.  Hx TIA.  Anxiety.  Anemia.   PSVT. S/p hemorrhoidectomy. Enterocele repair.  abd hysterectomy.    2003 colonoscopy: diverticulosis.    05/2018 UGI: Ba tablet passed easily.   01/2019 CTAP w contrast: colonic diverticulosis 12/25/2019: UGI series: Moderate presbyesophagus.  Small prox duodenal tic.  No HH or GERD.    Seen in GI office 11/2019 for ongoing dysphagia and epigastric pain.   12/28/2019 MBSS: limited due to pt reticence to participate.   "functional oropharyngeal swallow ability with no aspiration or deep laryngeal penetration of any consistency tested. Trace laryngeal penetration on swallow of thin via straw is [facility] for age. In addition, no pharyngeal residuals post-swallow.  Minimal difficulty orally transiting pudding bolus with appearance of near gagging but with encouragement pt able to propel posterior and swallow.  No oral issues with cracker, nectar or thin. SLP oral holding/near gagging with puree was anxiety driven."     GI added Omeprazole 40 mg po bid (pt "allergic" to Protonix).  Pt continues to have abd pain worse PP.  Consequently eating very little.   Came to ED 0545 today w abd pain.  "sharp, epigastric". LFTs, BMET unnremarkable, no renal insufficiency.  WBCs 3.9. Hgb 13.1.   Says wt dropped from 143 # to 125 # over several months.   Denies nausea, vomiting.  Occasional constipation which she manages effectively with prune juice.  Last bowel movement was over the weekend, brown stool.  Lives alone, she was widowed in the fall 2020.   Her son lives next door and is attentive.  No alcohol or cigarettes.  Past Medical History:  Diagnosis Date  . Anemia    iron def  . Anxiety   . Arrhythmia    paroxysmal SVT and symtomatic PAC's  . Diverticulosis    colon  . Elevated troponin   . GERD 04/02/2008   Qualifier: Diagnosis of  By: Jenny Reichmann MD, Hunt Oris   . History of colonic polyps    cecal  . Hypertension   . 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 Osteoporosis 07/21/2011   t score -2.7  . Palpitations 05/24/2017  . Stricture esophagus   . Vitamin D deficiency    2010    Past Surgical History:  Procedure Laterality Date  . A&P, enterocele repair     s/p 2003  . ABDOMINAL HYSTERECTOMY    . BREAST REDUCTION SURGERY     s/p 1996      PATIENT DENIES BREAST REDUCTION   . HEMORRHOID SURGERY    . LEFT HEART CATH AND CORONARY ANGIOGRAPHY N/A 05/25/2017   Procedure: LEFT HEART CATH AND CORONARY ANGIOGRAPHY;  Surgeon: Jettie Booze, MD;  Location: [facility];  Service: Cardiovascular;  Laterality: N/A;    Prior to Admission medications   Medication Sig Start Date End Date Taking? Authorizing Provider  aspirin EC 325 MG tablet Take 325 mg by mouth daily as needed (headache).   Yes [provider]  Cod Liver Oil CAPS Take 1 capsule by mouth daily.   Yes [provider]  famotidine (PEPCID) 20 MG tablet Take 1 tablet (20 mg total) by mouth 2 (two) times daily. 02/01/20  Yes Lacinda Axon, MD  flecainide (TAMBOCOR) 50 MG tablet Take 1 tablet (50 mg total) by mouth See admin instructions. Take 2 tablets (100 mg) by mouth every morning and 1 tablet (50 mg) at night 08/02/19  Yes Baldwin Jamaica, PA-C  LORazepam (ATIVAN) 0.5 MG tablet Take 0.5 tablets (0.25 mg total) by mouth daily as needed for anxiety. 01/16/20 01/15/21 Yes Asencion Noble, MD  metoprolol succinate (TOPROL XL) 25 MG 24 hr tablet Take 0.5 tablets (12.5 mg total) by mouth  daily. Patient not taking: Reported on 02/05/2020 12/31/19   Baldwin Jamaica, PA-C  omeprazole (PRILOSEC) 40 MG capsule Take 1 capsule (40 mg total) by mouth in the morning and at bedtime. Patient not taking: Reported on 02/05/2020 01/02/20 04/01/20  Levin Erp, PA    Scheduled Meds: . enoxaparin (LOVENOX) injection  40 mg Subcutaneous Q24H   Infusions:  PRN Meds: acetaminophen **OR** acetaminophen   Allergies as of 02/04/2020 - Review Complete 02/04/2020  Allergen Reaction Noted  . Other Anaphylaxis 07/22/2017  . Pantoprazole Other (See Comments) 07/22/2017  . Zyrtec [cetirizine] Palpitations 04/11/2014  . Ciprofloxacin  03/09/2019    Family History  Problem Relation Age of Onset  . Stroke Father        @ 77  . Hypertension Father   . Hypertension Sister   . Cancer Sister 64       brain  . Breast cancer Other 65       breast  . Breast cancer Daughter 76       spread to her brain  . Cancer Other 58       colon/MELANOMA  . Stroke Mother        medication induced  . Kidney disease Brother   . Cancer Brother     Social History   Socioeconomic History  . Marital status: Widowed    Spouse name: Not on file  . Number of children: 2  . Years of education: Not on file  . Highest education level: Not on file  Occupational History  . Occupation: Retired  Tobacco Use  . Smoking status: Former Smoker    Quit date: 05/24/1968    Years since quitting: 51.7  . Smokeless tobacco: Never Used  . Tobacco comment: only smoked 7 years  Vaping Use  . Vaping Use: Never used  Substance and Sexual Activity  . Alcohol use: No    Alcohol/week: 0.0 standard drinks  . Drug use: No  . Sexual activity: Never    Comment: 1st intercourse 78 yo-1 partner  Other Topics Concern  . Not on file  Social History Narrative  . Not on file   Social Determinants of Health   Financial Resource Strain:   . Difficulty of Paying Living Expenses: Not on file  Food Insecurity:   .  Worried About Charity fundraiser in the Last Year: Not on file  . Ran Out of Food in the Last Year: Not on file  Transportation Needs:   .  Lack of Transportation (Medical): Not on file  . Lack of Transportation (Non-Medical): Not on file  Physical Activity:   . Days of Exercise per Week: Not on file  . Minutes of Exercise per Session: Not on file  Stress:   . Feeling of Stress : Not on file  Social Connections:   . Frequency of Communication with Friends and Family: Not on file  . Frequency of Social Gatherings with Friends and Family: Not on file  . Attends Religious Services: Not on file  . Active Member of Clubs or Organizations: Not on file  . Attends Archivist Meetings: Not on file  . Marital Status: Not on file  Intimate Partner Violence:   . Fear of Current or Ex-Partner: Not on file  . Emotionally Abused: Not on file  . Physically Abused: Not on file  . Sexually Abused: Not on file    REVIEW OF SYSTEMS: Constitutional: No weakness or fatigue ENT:  No nose bleeds Pulm: No shortness of breath, no cough. CV:  No palpitations, no LE edema.  No angina GU:  No hematuria, no frequency GI: See HPI Heme: Denies unusual or excessive bleeding/bruising. Transfusions: No blood product transfusions ever. Neuro:  No headaches, no peripheral tingling or numbness.  No seizures, no syncope, no dizziness. Psych: Fitful sleep. Derm:  No itching, no rash or sores.  Endocrine:  No sweats or chills.  No polyuria or dysuria Immunization: Not queried Travel:  None beyond local counties in last few months.    PHYSICAL EXAM: Vital signs in last 24 hours: Vitals:   02/05/20 0730 02/05/20 0800  BP: 137/82 (!) 164/72  Pulse: 80 79  Resp: 16 11  Temp:    SpO2: 100% 98%   Wt Readings from Last 3 Encounters:  02/05/20 56.2 kg  02/01/20 59.4 kg  01/16/20 60.4 kg    General: Anxious, does not look ill.  Appears stated age, not particularly frail Head: No facial asymmetry or  swelling.  No signs of head trauma. Eyes: No conjunctival pallor, no scleral icterus. Ears: Not hard of hearing Nose: No congestion or discharge Mouth: Two thirds of her teeth are absent.  The remaining teeth in the front lower jaw are in good repair.  Mucosa is moist, pink, clear.  Tongue is midline. Neck: No JVD, no thyromegaly, no masses Lungs: Good breath sounds, clear bilaterally.  No labored breathing.  No cough Heart: RRR.  No MRG.  S1, S2 present. Abdomen: Soft.  Nontender.  Nondistended.  No HSM, masses, bruits, hernias.  Bowel sounds active.   Rectal: Not performed Musc/Skeltl: No gross joint swelling or deformities, no joint redness Extremities: No CCE. Neurologic: Oriented x3.  No tremors or involuntary movement.  Moves all 4 limbs, strength not tested. Skin: No rash, no sores, no suspicious lesions.  No significant purpura or bruising Tattoos: None Nodes: No cervical adenopathy Psych: Anxious, cooperative, fluid speech.  Intake/Output from previous day: No intake/output data recorded. Intake/Output this shift: No intake/output data recorded.  LAB RESULTS: Recent Labs    02/04/20 1528  WBC 3.9*  HGB 13.1  HCT 40.6  PLT 253   BMET Lab Results  Component Value Date   NA 141 02/04/2020   NA 140 12/15/2019   NA 140 07/11/2019   K 4.1 02/04/2020   K 3.6 12/15/2019   K 4.7 07/11/2019   CL 102 02/04/2020   CL 102 12/15/2019   CL 101 07/11/2019   CO2 25 02/04/2020   CO2  25 12/15/2019   CO2 25 07/11/2019   GLUCOSE 94 02/04/2020   GLUCOSE 186 (H) 12/15/2019   GLUCOSE 105 (H) 07/11/2019   BUN 8 02/04/2020   BUN 12 12/15/2019   BUN 14 07/11/2019   CREATININE 0.85 02/04/2020   CREATININE 0.75 12/15/2019   CREATININE 0.69 07/11/2019   CALCIUM 9.6 02/04/2020   CALCIUM 9.6 12/15/2019   CALCIUM 9.7 07/11/2019   LFT Recent Labs    02/04/20 1528  PROT 7.4  ALBUMIN 4.4  AST 21  ALT 12  ALKPHOS 58  BILITOT 0.7   PT/INR Lab Results  Component Value Date     INR 1.1 01/28/2019   INR 1.10 05/25/2017   INR 1.07 07/28/2014   Lipase     Component Value Date/Time   LIPASE 22 02/04/2020 1528      RADIOLOGY STUDIES: No results found.   IMPRESSION:   *   Chronic epigastric pain.  No evidence of GERD on recent UGI and MBSS.  No response to bid PPI Suspect functional, anxiety related. Has not had EGD.  Last colonoscopy was in 2003.  *   Anxiety.    PLAN:     *    EGD??,  Patient n.p.o. so could be done today..    Pt received subcu Lovenox at 940 this morning.   Azucena Freed  02/05/2020, 8:48 AM Phone [phone]

## 2020-02-05 NOTE — Telephone Encounter (Signed)
-----   Message from Thornton Park, MD sent at 02/05/2020  2:56 PM EDT ----- Regarding: Hospital follow-up [facility] needs hospital follow-up in 4-6 weeks. May schedule with Dr. Ardis Hughs or an APP, whoever has availability.  Seen today for multiple acute on chronic GI complaints including postprandial esophageal/epigastric pain and dysphagia not explained by MBBS or esophagram. EGD showed some gastropathy. Esophagus was normal. I dilated with an 61F TTS balloon without resistance. She is going to start PPI therapy for possible reflux-related dysmotility. She will need follow-up in 4-6 weeks and ? Esophageal manometry if symptoms are not improve. I suggest that she also try a couple of altoids prior to eating [facility] protocol") before she comes back to the office.

## 2020-02-05 NOTE — Anesthesia Postprocedure Evaluation (Signed)
Anesthesia Post Note  Patient: Dalisa Forrer Stueve  Procedure(s) Performed: ESOPHAGOGASTRODUODENOSCOPY (EGD) WITH PROPOFOL (N/A ) BIOPSY ESOPHAGEAL DILATION     Patient location during evaluation: Endoscopy Anesthesia Type: MAC Level of consciousness: awake and alert Pain management: pain level controlled Vital Signs Assessment: post-procedure vital signs reviewed and stable Respiratory status: spontaneous breathing, nonlabored ventilation, respiratory function stable and patient connected to nasal cannula oxygen Cardiovascular status: stable and blood pressure returned to baseline Postop Assessment: no apparent nausea or vomiting Anesthetic complications: no   No complications documented.  Last Vitals:  Vitals:   02/05/20 1443 02/05/20 1454  BP: 130/65 127/80  Pulse: 76 81  Resp: 16 17  Temp:    SpO2: 96% 98%    Last Pain:  Vitals:   02/05/20 1443  TempSrc:   PainSc: 0-No pain                 Belenda Cruise P Leila Schuff

## 2020-02-05 NOTE — ED Notes (Signed)
This RN explained to pt that she should not take her meds from home before talking to RN.[name] [name]  Pt voices understanding

## 2020-02-05 NOTE — Anesthesia Procedure Notes (Signed)
Procedure Name: MAC Date/Time: 02/05/2020 2:09 PM Performed by: Janace Litten, CRNA Pre-anesthesia Checklist: Patient identified, Emergency Drugs available, Suction available and Patient being monitored Patient Re-evaluated:Patient Re-evaluated prior to induction Oxygen Delivery Method: Nasal cannula

## 2020-02-06 ENCOUNTER — Other Ambulatory Visit: Payer: Self-pay | Admitting: Physician Assistant

## 2020-02-06 ENCOUNTER — Telehealth: Payer: Self-pay

## 2020-02-06 DIAGNOSIS — Z20822 Contact with and (suspected) exposure to covid-19: Secondary | ICD-10-CM | POA: Diagnosis present

## 2020-02-06 DIAGNOSIS — F419 Anxiety disorder, unspecified: Secondary | ICD-10-CM | POA: Diagnosis present

## 2020-02-06 DIAGNOSIS — R200 Anesthesia of skin: Secondary | ICD-10-CM | POA: Diagnosis present

## 2020-02-06 DIAGNOSIS — R109 Unspecified abdominal pain: Secondary | ICD-10-CM | POA: Diagnosis not present

## 2020-02-06 DIAGNOSIS — K2970 Gastritis, unspecified, without bleeding: Secondary | ICD-10-CM | POA: Diagnosis present

## 2020-02-06 DIAGNOSIS — M810 Age-related osteoporosis without current pathological fracture: Secondary | ICD-10-CM | POA: Diagnosis present

## 2020-02-06 DIAGNOSIS — K5900 Constipation, unspecified: Secondary | ICD-10-CM | POA: Diagnosis present

## 2020-02-06 DIAGNOSIS — K319 Disease of stomach and duodenum, unspecified: Secondary | ICD-10-CM | POA: Diagnosis present

## 2020-02-06 DIAGNOSIS — Z87892 Personal history of anaphylaxis: Secondary | ICD-10-CM | POA: Diagnosis not present

## 2020-02-06 DIAGNOSIS — Z888 Allergy status to other drugs, medicaments and biological substances status: Secondary | ICD-10-CM | POA: Diagnosis not present

## 2020-02-06 DIAGNOSIS — I493 Ventricular premature depolarization: Secondary | ICD-10-CM | POA: Diagnosis present

## 2020-02-06 DIAGNOSIS — R633 Feeding difficulties: Secondary | ICD-10-CM | POA: Diagnosis present

## 2020-02-06 DIAGNOSIS — R634 Abnormal weight loss: Secondary | ICD-10-CM | POA: Diagnosis present

## 2020-02-06 DIAGNOSIS — Z87891 Personal history of nicotine dependence: Secondary | ICD-10-CM | POA: Diagnosis not present

## 2020-02-06 DIAGNOSIS — D72819 Decreased white blood cell count, unspecified: Secondary | ICD-10-CM | POA: Diagnosis present

## 2020-02-06 DIAGNOSIS — I10 Essential (primary) hypertension: Secondary | ICD-10-CM | POA: Diagnosis present

## 2020-02-06 DIAGNOSIS — K219 Gastro-esophageal reflux disease without esophagitis: Secondary | ICD-10-CM | POA: Diagnosis present

## 2020-02-06 DIAGNOSIS — Z8719 Personal history of other diseases of the digestive system: Secondary | ICD-10-CM | POA: Diagnosis not present

## 2020-02-06 DIAGNOSIS — I471 Supraventricular tachycardia: Secondary | ICD-10-CM | POA: Diagnosis present

## 2020-02-06 DIAGNOSIS — R002 Palpitations: Secondary | ICD-10-CM | POA: Diagnosis present

## 2020-02-06 DIAGNOSIS — R1314 Dysphagia, pharyngoesophageal phase: Secondary | ICD-10-CM | POA: Diagnosis present

## 2020-02-06 DIAGNOSIS — K2990 Gastroduodenitis, unspecified, without bleeding: Secondary | ICD-10-CM | POA: Diagnosis present

## 2020-02-06 DIAGNOSIS — Z85828 Personal history of other malignant neoplasm of skin: Secondary | ICD-10-CM | POA: Diagnosis not present

## 2020-02-06 DIAGNOSIS — K224 Dyskinesia of esophagus: Secondary | ICD-10-CM | POA: Diagnosis present

## 2020-02-06 DIAGNOSIS — K228 Other specified diseases of esophagus: Secondary | ICD-10-CM | POA: Diagnosis present

## 2020-02-06 LAB — CBC
HCT: 37.2 % (ref 36.0–46.0)
Hemoglobin: 12.3 g/dL (ref 12.0–15.0)
MCH: 31.9 pg (ref 26.0–34.0)
MCHC: 33.1 g/dL (ref 30.0–36.0)
MCV: 96.4 fL (ref 80.0–100.0)
Platelets: 206 10*3/uL (ref 150–400)
RBC: 3.86 MIL/uL — ABNORMAL LOW (ref 3.87–5.11)
RDW: 13.1 % (ref 11.5–15.5)
WBC: 3.7 10*3/uL — ABNORMAL LOW (ref 4.0–10.5)
nRBC: 0 % (ref 0.0–0.2)

## 2020-02-06 LAB — BASIC METABOLIC PANEL
Anion gap: 10 (ref 5–15)
BUN: 18 mg/dL (ref 8–23)
CO2: 24 mmol/L (ref 22–32)
Calcium: 9.3 mg/dL (ref 8.9–10.3)
Chloride: 104 mmol/L (ref 98–111)
Creatinine, Ser: 0.71 mg/dL (ref 0.44–1.00)
GFR calc Af Amer: >60 mL/min (ref >60)
GFR calc non Af Amer: >60 mL/min (ref >60)
Glucose, Bld: 89 mg/dL (ref 70–99)
Potassium: 3.6 mmol/L (ref 3.5–5.1)
Sodium: 138 mmol/L (ref 135–145)

## 2020-02-06 MED ORDER — LORAZEPAM 0.5 MG PO TABS
0.5000 mg | ORAL_TABLET | Freq: Once | ORAL | Status: AC
  Administered 2020-02-06: 0.5 mg via ORAL
  Filled 2020-02-06: qty 1

## 2020-02-06 MED ORDER — POLYETHYLENE GLYCOL 3350 17 G PO PACK
17.0000 g | PACK | Freq: Every day | ORAL | Status: DC
  Filled 2020-02-06 (×2): qty 1

## 2020-02-06 MED ORDER — INFLUENZA VAC A&B SA ADJ QUAD 0.5 ML IM PRSY: 0.5000 mL | PREFILLED_SYRINGE | Freq: Once | INTRAMUSCULAR | Status: DC

## 2020-02-06 MED ORDER — BUSPIRONE HCL 5 MG PO TABS
5.0000 mg | ORAL_TABLET | Freq: Two times a day (BID) | ORAL | Status: DC
  Administered 2020-02-07: 5 mg via ORAL
  Filled 2020-02-06 (×3): qty 1

## 2020-02-06 NOTE — Telephone Encounter (Signed)
-----   Message from Milus Banister, MD sent at 02/06/2020  5:36 AM EDT ----- Regarding: RE: Hospital follow-up [name] [name], Thanks.    [name] [name] I like the idea of altoid with every meal. [name] [name] can you mention that to her. OV with me in 4-6 weeks.  Thanks ----- Message ----- From: Thornton Park, MD Sent: 02/05/2020   2:56 PM EDT To: Milus Banister, MD, [name] Lasso, RN Subject: Hospital follow-up                             [facility] needs hospital follow-up in 4-6 weeks. May schedule with Dr. Ardis Hughs or an APP, whoever has availability.  Seen today for multiple acute on chronic GI complaints including postprandial esophageal/epigastric pain and dysphagia not explained by MBBS or esophagram. EGD showed some gastropathy. Esophagus was normal. I dilated with an 2F TTS balloon without resistance. She is going to start PPI therapy for possible reflux-related dysmotility. She will need follow-up in 4-6 weeks and ? Esophageal manometry if symptoms are not improve. I suggest that she also try a couple of altoids prior to eating [facility] protocol") before she comes back to the office.

## 2020-02-06 NOTE — Progress Notes (Signed)
Physical Therapy Treatment Patient Details Name: [name] [name] MRN: [id number] DOB: 14-Oct-1929 Today's Date: 02/06/2020    History of Present Illness 86 yo female with onset of abd pain with worsening and palpitations was admitted through ED.  Has spasmed feelings in abd, cannot swallow food well with recent significant weight loss per chart.  Noted on chart dysphagia/odynophagia, anxiety, palpitations.  PMHx:  TIA, SVT, PAC's, PVC's    PT Comments    Patient progressing steadily towards PLOF and requires supervision for bed mobility and min guard for safety with transfers and gait. No overt LOB noted in gait with no device, pt required rest after ~200'. Pt educated on functional LE strengthening to maintain LE strength/endurance.  Pt reports she has palpitations after eating and walking and her stomach remains irritable since attempting to eat this AM. She was educated on benefits of mobility and is highly motivated, acute PT will continue to follow and progress as able. Continue to recommend HHPT follow up and possibly OPPT for balance and strength after HH is complete.     Follow Up Recommendations  Home health PT;Supervision for mobility/OOB     Equipment Recommendations  Rolling walker with 5" wheels    Recommendations for Other Services       Precautions / Restrictions Precautions Precautions: Fall Restrictions Weight Bearing Restrictions: No    Mobility  Bed Mobility Overal bed mobility: Needs Assistance Bed Mobility: Supine to Sit;Sit to Supine     Supine to sit: Supervision Sit to supine: Supervision   General bed mobility comments: pt required extra time, no assist needed, HOB slightly elevated.  Transfers Overall transfer level: Needs assistance Equipment used: None Transfers: Sit to/from Stand Sit to Stand: Min guard         General transfer comment: min guard for safety  Ambulation/Gait Ambulation/Gait assistance: Min guard Gait Distance (Feet): 200  Feet Assistive device: None Gait Pattern/deviations: Step-through pattern;Decreased stride length Gait velocity: decr Gait velocity interpretation: <1.31 ft/sec, indicative of household ambulator General Gait Details: pt with no overt LOB noted, pt mildly unsteady with turns but able to correct balance without assist.    Stairs Stairs:  (pt declined to perform stairs today.)           Wheelchair Mobility    Modified Rankin (Stroke Patients Only)       Balance Overall balance assessment: Needs assistance Sitting-balance support: Feet supported Sitting balance-Leahy Scale: Good     Standing balance support: Single extremity supported Standing balance-Leahy Scale: Good   Single Leg Stance - Right Leg: 5 Single Leg Stance - Left Leg: 7             Cognition Arousal/Alertness: Awake/alert Behavior During Therapy: WFL for tasks assessed/performed Overall Cognitive Status: Within Functional Limits for tasks assessed             Exercises Other Exercises Other Exercises: 4x SLS on Bil LE with min guard and counter support (intermittent) 5-10 sec holds Other Exercises: 10x LAQ bil LE Other Exercises: 10 marching standing wiht Bil UE support Other Exercises: ankle pumps 20x seated Other Exercises: 10x bil Le heel raises with bil UE support in standing.    General Comments        Pertinent Vitals/Pain Pain Assessment: 0-10 Pain Score: 10-Worst pain ever Pain Location: abdomen/stomach Pain Descriptors / Indicators: Discomfort;Throbbing (rolling in stomach) Pain Intervention(s): Limited activity within patient's tolerance;Repositioned;Monitored during session    [place] expects to be discharged to:: Private residence Living  Arrangements: Alone                      PT Goals (current goals can now be found in the care plan section) Acute Rehab PT Goals Patient Stated Goal: to get better and eat again, regain lost wgt PT Goal  Formulation: With patient Time For Goal Achievement: 02/19/20 Potential to Achieve Goals: Good Progress towards PT goals: Progressing toward goals    Frequency    Min 3X/week      PT Plan Current plan remains appropriate       AM-PAC PT "6 Clicks" Mobility   Outcome Measure  Help needed turning from your back to your side while in a flat bed without using bedrails?: None Help needed moving from lying on your back to sitting on the side of a flat bed without using bedrails?: None Help needed moving to and from a bed to a chair (including a wheelchair)?: A Little Help needed standing up from a chair using your arms (e.g., wheelchair or bedside chair)?: A Little Help needed to walk in hospital room?: A Little Help needed climbing 3-5 steps with a railing? : A Lot 6 Click Score: 19    End of Session Equipment Utilized During Treatment: Gait belt Activity Tolerance: Patient limited by fatigue;Treatment limited secondary to medical complications (Comment) Patient left: in bed;with call bell/phone within reach Nurse Communication: Mobility status PT Visit Diagnosis: Unsteadiness on feet (R26.81);Muscle weakness (generalized) (M62.81);Difficulty in walking, not elsewhere classified (R26.2)     Time: 6384-6659 PT Time Calculation (min) (ACUTE ONLY): 26 min  Charges:  $Gait Training: 8-22 mins $Therapeutic Exercise: 8-22 mins                     Verner Mould, DPT Acute Rehabilitation Services  Office [phone] Pager [phone]  02/06/2020 1:40 PM

## 2020-02-06 NOTE — Telephone Encounter (Signed)
I spoke with the pt's son and made him aware of the appt and that the pt should have 1-2 altoids prior to meals.  The pt has been advised of the information and verbalized understanding.

## 2020-02-06 NOTE — Telephone Encounter (Signed)
I think we can push the OV out a bit.  Also I like the idea of altoid with every meal between now and then.    Thanks

## 2020-02-06 NOTE — Telephone Encounter (Signed)
The patient son has been notified of this information and all questions answered. He will have her have 1-2 altoids before meals.

## 2020-02-06 NOTE — Evaluation (Signed)
Occupational Therapy Evaluation Patient Details Name: [name] [name] MRN: [id number] DOB: 1930/03/10 Today's Date: 02/06/2020    History of Present Illness 86 yo female with onset of abd pain with worsening and palpitations was admitted through ED.  Has spasmed feelings in abd, cannot swallow food well with recent significant weight loss per chart.  Noted on chart dysphagia/odynophagia, anxiety, palpitations.  PMHx:  TIA, SVT, PAC's, PVC's   Clinical Impression   Patient admitted with the above diagnosis.  Currently she is functioning at or near her stated prior level of function.  Patient is up and moving ad lib in her room, no safety concerns or loss of balance noted.  Patient does not require OT in the acute setting, do not anticipate any HH OT needs.  All questions answered.     Follow Up Recommendations  No OT follow up    Equipment Recommendations  None recommended by OT    Recommendations for Other Services  [facility] PT     Precautions / Restrictions Precautions Precautions: Fall Restrictions Weight Bearing Restrictions: No      Mobility Bed Mobility Overal bed mobility: Modified Independent Bed Mobility: Sit to Supine;Supine to Sit     Supine to sit: HOB elevated Sit to supine: HOB elevated   General bed mobility comments: pt required extra time, no assist needed, HOB slightly elevated.  Transfers Overall transfer level: Independent Equipment used: None Transfers: Sit to/from Stand Sit to Stand: Independent         General transfer comment: patient feeling better compared to PT session.  No abdominal discomfort.    Balance Overall balance assessment: No apparent balance deficits (not formally assessed) Sitting-balance support: Feet supported Sitting balance-Leahy Scale: Normal     Standing balance support: Single extremity supported Standing balance-Leahy Scale: Good   Single Leg Stance - Right Leg: 5 Single Leg Stance - Left Leg: 7                        ADL either performed or assessed with clinical judgement   ADL Overall ADL's : At baseline                                       General ADL Comments: Patient is able to complete all ADL and simulated ADL in the acute setting.  No safety concerns or LOB noted.     Vision Baseline Vision/History: No visual deficits Patient Visual Report: No change from baseline Vision Assessment?: No apparent visual deficits     Perception     Praxis      Pertinent Vitals/Pain Pain Assessment: Faces Pain Score: 10-Worst pain ever Faces Pain Scale: Hurts a little bit Pain Location: abdomen/stomach Pain Descriptors / Indicators: Spasm Pain Intervention(s): Limited activity within patient's tolerance     Hand Dominance Right   Extremity/Trunk Assessment Upper Extremity Assessment Upper Extremity Assessment: Overall WFL for tasks assessed   Lower Extremity Assessment Lower Extremity Assessment: Defer to PT evaluation   Cervical / Trunk Assessment Cervical / Trunk Assessment: Kyphotic   Communication Communication Communication: No difficulties   Cognition Arousal/Alertness: Awake/alert Behavior During Therapy: WFL for tasks assessed/performed Overall Cognitive Status: Within Functional Limits for tasks assessed  General Comments  Patient able to stand for grooming, had patient folding towels counter level and placing on shelf.  Patient able to pick items off the floor.  Patient place sheet and comforter on bed without assist.    Exercises    Shoulder Instructions      Home Living Family/patient expects to be discharged to:: Private residence Living Arrangements: Alone Available Help at Discharge: Family;Available PRN/intermittently Type of Home: House Home Access: Stairs to enter     Home Layout: Two level Alternate Level Stairs-Number of Steps: 12 Alternate Level Stairs-Rails: Right;Left Bathroom  Shower/Tub: Teacher, early years/pre: Standard Bathroom Accessibility: Yes How Accessible: Accessible via walker Home Equipment: [place] - 4 wheels;Cane - single point          Prior Functioning/Environment Level of Independence: Independent                 OT Problem List: Pain      OT Treatment/Interventions:      OT Goals(Current goals can be found in the care plan section) Acute Rehab OT Goals Patient Stated Goal: to be able to swallow better. OT Goal Formulation: With patient Time For Goal Achievement: 02/11/20 Potential to Achieve Goals: Good  OT Frequency:     Barriers to D/C:  none noted          Co-evaluation              AM-PAC OT "6 Clicks" Daily Activity     Outcome Measure Help from another person eating meals?: None Help from another person taking care of personal grooming?: None Help from another person toileting, which includes using toliet, bedpan, or urinal?: None Help from another person bathing (including washing, rinsing, drying)?: None Help from another person to put on and taking off regular upper body clothing?: None Help from another person to put on and taking off regular lower body clothing?: None 6 Click Score: 24   End of Session Equipment Utilized During Treatment: Gait belt  Activity Tolerance: Patient tolerated treatment well Patient left: in bed;with call bell/phone within reach  OT Visit Diagnosis: Pain Pain - part of body:  (abdomen.)                Time: 0263-7858 OT Time Calculation (min): 28 min Charges:  OT General Charges $OT Visit: 1 Visit OT Evaluation $OT Eval Low Complexity: 1 Low OT Treatments $Self Care/Home Management : 8-22 mins  02/06/2020  Rich, OTR/L  Acute Rehabilitation Services  Office:  [phone]  Metta Clines 02/06/2020, 2:05 PM

## 2020-02-06 NOTE — TOC Initial Note (Signed)
Transition of Care [facility]) - Initial/Assessment Note    Patient Details  Name: [name] [name] MRN: [id number] Date of Birth: 09-17-1929  [facility] CM/SW Contact:    Marilu Favre, RN Phone Number: 02/06/2020, 12:24 PM  Clinical Narrative:                 Confirmed face sheet information with patient. Patient lives alone , however states her son is next door and available to help. Patient also has life alert system, walker and a cane.   Patient agreeable to HHPT, referral given to and accepted by [facility] with Alvis Lemmings. WIll need MD signed order and face to face.   Expected Discharge Plan: [place] Barriers to Discharge: Continued Medical Work up   Patient Goals and CMS Choice Patient states their goals for this hospitalization and ongoing recovery are:: to return tohome CMS Medicare.gov Compare Post Acute Care list provided to:: Patient Choice offered to / list presented to : Patient  Expected Discharge Plan and Services Expected Discharge Plan: [place] Choice: [place] arrangements for the past 2 months: Single Family Home                 DME Arranged: N/A         HH Arranged: PT HH Agency: [place] Date [place]: 02/06/20 Time [place]: 3825 Representative spoke with at [place]: Tommi Rumps  Prior Living Arrangements/Services Living arrangements for the past 2 months: [place] Lives with:: Self Patient language and need for interpreter reviewed:: Yes Do you feel safe going back to the place where you live?: Yes      Need for Family Participation in Patient Care: Yes (Comment) Care giver support system in place?: Yes (comment) Current home services: DME Criminal Activity/Legal Involvement Pertinent to Current Situation/Hospitalization: No - Comment as needed  Activities of Daily Living      Permission Sought/Granted   Permission granted to  share information with : No              Emotional Assessment Appearance:: Appears stated age Attitude/Demeanor/Rapport: Engaged Affect (typically observed): Accepting Orientation: : Oriented to Self, Oriented to Place, Oriented to  Time, Oriented to Situation Alcohol / Substance Use: Not Applicable Psych Involvement: No (comment)  Admission diagnosis:  Esophageal dysphagia [R13.10] Odynophagia [R13.10] Patient Active Problem List   Diagnosis Date Noted   Esophageal dysphagia 02/05/2020   Gastritis and gastroduodenitis    Presbyesophagus 01/17/2020   Difficulty swallowing pills 08/17/2019   Sore throat 08/13/2019   Varicose veins of both legs with edema 06/04/2019   Hyperlipidemia 06/04/2019   History of repair of rectocele 03/13/2019   Allergic reaction caused by a drug 03/13/2019   Rotator cuff arthropathy, right 04/08/2016   Arthritis of knee 03/25/2016   Presbycusis of both ears 01/15/2016   Tinnitus of right ear 01/15/2016   Skin cancer of arm 09/26/2014   Palpitation 09/28/2012   Rhinitis 12/25/2011   History of TIA (transient ischemic attack) 07/19/2011   ATRIAL PREMATURE BEATS 03/20/2009   ANEMIA-IRON DEFICIENCY 04/02/2008   Anxiety 04/02/2008   Essential hypertension 04/02/2008   OSTEOPOROSIS 04/02/2008   PCP:  [name], MD Pharmacy:   [place], [place] [address] [place] [address] Phone: [phone] Fax: [phone]  [place] [phone] -  [place], [place] [address] Phone: [phone] Fax: [phone]     Social Determinants of Health (SDOH) Interventions    Readmission Risk Interventions No flowsheet data found.

## 2020-02-06 NOTE — Telephone Encounter (Signed)
11/9 AT 11:10 AM with Dr Ardis Hughs.  Left message on machine to call back

## 2020-02-06 NOTE — Hospital Course (Addendum)
This is a 86 year old female with a history of anxiety, GERD, osteoporosis, hypertension, PACs, PVCs (well controlled on fleicanide) and presbyesophagus who presented with worsening abdominal discomfort and palpitations.   Dysphagia/Odynophagia: Due patient's history of PVCs and PACs and patient presentation for concern for palpitation Trp checked at presentation, but was negative. Patient evaluated by GI who did EGD procedure. EGD noted normal esophagus, gastropathy, and a normal duodenum. Patient had samples taken from esophagus and stomach. GI recommended that patient continue PPI BID and take altoid before every meal until follow-up outpatient. Patient was started back on her diet and instructed to take her time eating and take small bites and chew well. Patient initially had some trouble but was able to eat her 2nd meals and gradually increased PACs/PVCs: Patient flecaninde was initially held due to NPO diet for EGD. Flecanide was resumed post-procedure. Anxiety: Concerned that patient hx of anxiety was increasing painful symptoms associated with eating and swallowing. Started buspar 5mg  BID, to which she responded well.  Hypertension: Patient BP well controlled and monitored during inpatient. Held HTN meds throughout hospitalization.

## 2020-02-06 NOTE — Progress Notes (Signed)
° °  Subjective: No overnight events. Patient reports doing well this morning and would like to go home soon. She is aware of the recommendations GI made. She has not had a meal since the procedure, but is willing to try.   Objective:  Vital signs in last 24 hours: Vitals:   02/05/20 2100 02/06/20 0147 02/06/20 0219 02/06/20 0545  BP: 120/65 122/72  125/68  Pulse: 72 68  62  Resp:  16  18  Temp:  98.3 F (36.8 C)  97.8 F (36.6 C)  TempSrc:  Oral  Oral  SpO2: 96% 97%  95%  Weight:   57.1 kg   Height:   5' 5.5" (1.664 m)    Physical Exam Constitutional:      General: She is not in acute distress.    Appearance: She is well-developed. She is not ill-appearing, toxic-appearing or diaphoretic.  HENT:     Head: Normocephalic and atraumatic.  Cardiovascular:     Rate and Rhythm: Normal rate and regular rhythm.     Heart sounds: Normal heart sounds.  Pulmonary:     Effort: Pulmonary effort is normal.     Breath sounds: Normal breath sounds.  Abdominal:     General: Abdomen is flat. Bowel sounds are normal.     Tenderness: There is no abdominal tenderness.  Neurological:     General: No focal deficit present.     Mental Status: She is alert and oriented to person, place, and time.     Assessment/Plan:  Active Problems:   Esophageal dysphagia   Gastritis and gastroduodenitis  This is a 86 year old female with a history of anxiety, GERD, osteoporosis, hypertension, PACs, PVCs (well controlled on fleicanide) and presbyesophagus who presented with worsening abdominal discomfort and palpitations.   Dysphagia/Odynophagia: EGD noted normal esophagus, gastropathy, and normal duodenum. Had surgical pathology for esophagus and stomach. Patient continues to leukocytopenia 3.7. Hgb 12.3. Plt 206. Patient feeling well this morning and is keen to start a diet with GI's new recommendation. She will attempt to eat breakfast slowly this AM. Per patient chart she has an allergy with omeprazole  upon speaking with patient determined that she will not take this medication again. If patient remains unable to eat consider a Temporary Feeding tube or gabapentin. Will continue to evaluate. -GI signed off  - Per GI recc's continue PPI (famotidine) BID and trial Altoids with every meal until follow-up. - Diet advanced to heart -Monitor CBC and BMP -Dietary consult  PACs/PVCs: Well-controlled on flecainide, heart rate 70s..  She had been started on metoprolol PRN however patient states that she has taken any. No palpitations at this time.   - Continue home flecanide  Anxiety: Concern that anxiety may be worsening patient's painful symptoms and sensations associated with eating and swallowing. SSRI and SNRI are not plausible in this patient due to her chronic flecanide use. - Buspar 5mg  BID  Hypertension: Blood pressure well controlled at this time, will continue to monitor for now.  Hold off on restarting home medications.  Prior to Admission Living Arrangement: Home Anticipated Discharge Location:Home w/ HHPT Barriers to Discharge: Treatment Dispo: Anticipated discharge in approximately 1-2 day(s).   Freida Busman, MD 02/06/2020, 6:53 AM Pager: [phone] After 5pm on weekdays and 1pm on weekends: On Call pager [phone]

## 2020-02-06 NOTE — Progress Notes (Signed)
Pt arrived to 6N12 via bed. Pt awake, alert and oriented x4. See assessment. See MAR. Will continue to monitor pt.

## 2020-02-07 ENCOUNTER — Encounter (HOSPITAL_COMMUNITY): Payer: Self-pay | Admitting: Gastroenterology

## 2020-02-07 LAB — SURGICAL PATHOLOGY

## 2020-02-07 MED ORDER — BUSPIRONE HCL 5 MG PO TABS
5.0000 mg | ORAL_TABLET | Freq: Two times a day (BID) | ORAL | 0 refills | Status: DC
Start: 2020-02-07 — End: 2020-04-24

## 2020-02-07 NOTE — Progress Notes (Signed)
Patient discharged to home. Verbalizes understanding of all discharge instructions including discharge medications and follow up MD visits.  

## 2020-02-07 NOTE — Progress Notes (Signed)
° °  Subjective: Patient had no overnight events. She reports that she was able to eat a cup of yogurt and some cake at dinner. She was able to eat breakfast this morning and ate more at lunch. She likes her current medication regimen.  Objective:  Vital signs in last 24 hours: Vitals:   02/06/20 1015 02/06/20 1248 02/06/20 2106 02/07/20 0515  BP: 140/67 108/61 (!) 118/54 102/61  Pulse: 75 79 65 64  Resp: 18 20 16 16   Temp: 97.8 F (36.6 C)  98.3 F (36.8 C) 98.1 F (36.7 C)  TempSrc: Oral  Oral Oral  SpO2: 98% 100% 96% 95%  Weight:      Height:       Physical Exam Constitutional:      General: She is not in acute distress.    Appearance: Normal appearance. She is not ill-appearing, toxic-appearing or diaphoretic.  HENT:     Mouth/Throat:     Mouth: Mucous membranes are moist.  Cardiovascular:     Rate and Rhythm: Normal rate and regular rhythm.  Pulmonary:     Effort: Pulmonary effort is normal.     Breath sounds: Normal breath sounds.  Abdominal:     General: Abdomen is flat. Bowel sounds are normal.     Palpations: Abdomen is soft.  Skin:    General: Skin is warm and dry.  Neurological:     General: No focal deficit present.     Mental Status: She is alert and oriented to person, place, and time.  Psychiatric:        Mood and Affect: Mood normal.        Behavior: Behavior normal.     Assessment/Plan:  Active Problems:   Esophageal dysphagia   Gastritis and gastroduodenitis This isa 86 year old female with a history of anxiety, GERD, osteoporosis, hypertension,PACs, PVCs (well controlled on fleicanide)and presbyesophagus who presented with worsening abdominal discomfort and palpitations.   Dysphagia/Odynophagia: Patient was able to eat meals over the past 12 hours and has seen improvement. She will continue her medications and adjustments to her eating habits.  - Per GI recc's continue PPI (famotidine) BID and trial Altoids with every meal until  follow-up. - Buspar TID  PACs/PVCs: No palpitations at this time.  - Continue home flecanide  Anxiety: Patient appeared much less anxious today and a bit more linear in her thinking.  - Buspar 5mg  BID  Hypertension: Blood pressure well controlled at this time, will continue to monitor for now. Hold off on restarting home medications.   Prior to Admission Living Arrangement: Home Anticipated Discharge Location: Home Barriers to Discharge: None Dispo: Anticipated discharge in approximately today   Freida Busman, MD 02/07/2020, 2:56 PM Pager:[phone] After 5pm on weekdays and 1pm on weekends: On Call pager [phone]

## 2020-02-07 NOTE — Discharge Instructions (Signed)
Dear [name] [name],   Thank you so much for allowing [place] to be part of your care!  You were admitted to [facility] for issues with swallowing (dysphagia and odynophagia).   POST-HOSPITAL & CARE INSTRUCTIONS 1. Please continue to take your old and new medications. New medications include famotidine twice a day and Altoids before each meal. Buspar two times a day. You no longer need the Ativan as needed now that you are on Buspar scheduled. 2. Continue to eat slowly and chew multiple times before swallowing. 3. Please make a follow up appointment [facility] with Dr. [name].  4. Please let PCP/Specialists know of any changes that were made.  5. Please see medications section of this packet for any medication changes.   DOCTOR'S APPOINTMENT & FOLLOW UP CARE INSTRUCTIONS  Future Appointments  Date Time Provider [place]  02/20/2020 11:15 AM Ruby Cola, RD [place] [facility]  04/01/2020 11:10 AM Milus Banister, MD [facility]    RETURN PRECAUTIONS:   Take care and be well!  Internal [facility]  [place], [place] [address] [phone]   Dysphagia Eating Plan, Bite Size Food This diet plan is for people with moderate swallowing problems who have transitioned from pureed and minced foods. Bite size foods are soft and cut into small chunks so that they can be swallowed safely. On this eating plan, you may be instructed to drink liquids that are thickened. Work with your health care provider and your diet and nutrition specialist (dietitian) to make sure that you are following the diet safely and getting all the nutrients you need. What are tips for following this plan? General guidelines for foods   You may eat foods that are tender, soft, and moist.  Always test food texture before taking a bite. Poke food with a fork or spoon to make sure it is  tender.  Food should be easy to cut and shew. Avoid large pieces of food that require a lot of chewing.  Take small bites. Each bite should be smaller than your thumb nail (about 66mm by 15 mm).  If you were on pureed and minced food diet plans, you may eat any of the foods included in those diets.  Avoid foods that are very dry, hard, sticky, chewy, coarse, or crunchy.  If instructed by your health care provider, thicken liquids. Follow your health care provider's instructions for what products to use, how to do this, and to what thickness. ? Your health care provider may recommend using a commercial thickener, rice cereal, or potato flakes. Ask your health care provider to recommend thickeners. ? Thickened liquids are usually a "pudding-like" consistency, or they may be as thick as honey or thick enough to eat with a spoon. Cooking  To moisten foods, you may add liquids while you are blending, mashing, or grinding your foods to the right consistency. These liquids include gravies, sauces, vegetable or fruit juice, milk, half and half, or water.  Strain extra liquid from foods before eating.  Reheat foods slowly to prevent a tough crust from forming.  Prepare foods in advance. Meal planning  Eat a variety of foods to get all the nutrients you need.  Some foods may be tolerated better than others. Work with your dietitian to identify which foods are safest for you to eat.  Follow your meal plan as told by your dietitian.  What foods are allowed? Grains Moist breads without nuts or seeds. Biscuits, muffins, pancakes, and waffles that are well-moistened with syrup, jelly, margarine, or butter. Cooked cereals. Moist bread stuffing. Moist rice. Well-moistened cold cereal with small chunks. Well-cooked pasta, noodles, rice, and bread dressing in small pieces and thick sauce. Soft dumplings or spaetzle in small pieces and butter or gravy. Vegetables Soft, well-cooked vegetables in small  pieces. Soft-cooked, mashed potatoes. Thickened vegetable juice. Fruits Canned or cooked fruits that are soft or moist and do not have skin or seeds. Fresh, soft bananas. Thickened fruit juices. Meat and other protein foods Tender, moist meats or poultry in small pieces. Moist meatballs or meatloaf. Fish without bones. Eggs or egg substitutes in small pieces. Tofu. [place] and meat alternatives in small pieces. Well-cooked, tender beans, peas, baked beans, and other legumes. Dairy Thickened milk. Cream cheese. Yogurt. Cottage cheese. Sour cream. Small pieces of soft cheese. Fats and oils Butter. Oils. Margarine. Mayonnaise. Gravy. Spreads. Sweets and desserts Soft, smooth, moist desserts. Pudding. Custard. Moist cakes. Jam. Jelly. Honey. Preserves. Ask your health care provider whether you can have frozen desserts. Seasoning and other foods All seasonings and sweeteners. All sauces with small chunks. Prepared tuna, egg, or chicken salad without raw fruits or vegetables. Moist casseroles with small, tender pieces of meat. Soups with tender meat. What foods are not allowed? Grains Coarse or dry cereals. Dry breads. Toast. Crackers. Tough, crusty breads, such as [place] bread and baguettes. Dry pancakes, waffles, and muffins. Sticky rice. Dry bread stuffing. Granola. Popcorn. Chips. Vegetables All raw vegetables. Cooked corn. Rubbery or stiff cooked vegetables. Stringy vegetables, such as celery. Tough, crisp fried potatoes. Potato skins. Fruits Hard, crunchy, stringy, high-pulp, and juicy raw fruits such as apples, pineapple, papaya, and watermelon. Small, round fruits, such as grapes. Dried fruit and fruit leather. Meat and other protein foods Large pieces of meat. Dry, tough meats, such as bacon, sausage, and hot dogs. Chicken, [place], or fish with skin and bones. Crunchy peanut butter. Nuts. Seeds. Nut and seed butters. Dairy Yogurt with nuts, seeds, or large chunks. Large chunks of cheese.  Frozen desserts and milk consistency not allowed by your dietitian. Sweets and desserts Dry cakes. Chewy or dry cookies. Any desserts with nuts, seeds, dry fruits, coconut, pineapple, or anything dry, sticky, or hard. Chewy caramel. Licorice. Taffy-type candies. Ask your health care provider whether you can have frozen desserts. Seasoning and other foods Soups with tough or large chunks of meats, poultry, or vegetables. Corn or clam chowder. Smoothies with large chunks of fruit. Summary  Bite size foods can be helpful for people with moderate swallowing problems.  On the dysphagia eating plan, you may eat foods that are soft, moist, and cut into pieces smaller than 95mm by 55mm.  You may be instructed to thicken liquids. Follow your health care provider's instructions about how to do this and to what consistency. This information is not intended to replace advice given to you by your health care provider. Make sure you discuss any questions you have with your health care provider. Document Revised: 08/31/2018 Document Reviewed: 08/20/2016 Elsevier Patient Education  [place].

## 2020-02-08 ENCOUNTER — Encounter: Payer: Self-pay | Admitting: Gastroenterology

## 2020-02-08 NOTE — Discharge Summary (Signed)
Name: [name] [name] MRN: [id number] DOB: [birth year] 86 y.o. PCP: [name] Payment, MD  Date of Admission: 02/04/2020  3:24 PM Date of Discharge: 02/07/2020 Attending Physician: [name]  Discharge Diagnosis: 1. Dysphagia/Odynophagia  Discharge Medications: Allergies as of 02/07/2020      Reactions   Other Anaphylaxis   Reaction to GI Cocktail   Pantoprazole Other (See Comments)   Upset stomach and weight loss   Zyrtec [cetirizine] Palpitations   Ciprofloxacin    Rash with burning all over body      Medication List    STOP taking these medications   LORazepam 0.5 MG tablet Commonly known as: Ativan   metoprolol succinate 25 MG 24 hr tablet Commonly known as: Toprol XL   omeprazole 40 MG capsule Commonly known as: PRILOSEC     TAKE these medications   aspirin EC 325 MG tablet Take 325 mg by mouth daily as needed (headache).   busPIRone 5 MG tablet Commonly known as: BUSPAR Take 1 tablet (5 mg total) by mouth 2 (two) times daily.   Cod Liver Oil Caps Take 1 capsule by mouth daily.   famotidine 20 MG tablet Commonly known as: PEPCID Take 1 tablet (20 mg total) by mouth 2 (two) times daily.   flecainide 50 MG tablet Commonly known as: TAMBOCOR Take 1 tablet (50 mg total) by mouth See admin instructions. Take 2 tablets (100 mg) by mouth every morning and 1 tablet (50 mg) at night       Disposition and follow-up:   [name] was discharged from [facility] in Stable condition.  At the hospital follow up visit please address:  1.  Please reassess patient eating habits (eating slowly and chewing well). Patient should be eating Altoids before every meal and taking Famotidine BID.  2.  Labs / imaging needed at time of follow-up: None  3.  Pending labs/ test needing follow-up: None  Follow-up Appointments:  Follow-up Information    [facility] Follow up.   Specialty: Home Health Services Contact information: [place] [address] [phone]        [name] Payment, MD. Schedule an appointment as soon as possible for a visit.   Specialty: Internal Medicine Why: Schedule an appointment with Dr. Maralyn [name] information: [address] [place] [id number] [phone]               Hospital Course by problem list: This is a 86 year old female with a history of anxiety, GERD, osteoporosis, hypertension, PACs, PVCs (well controlled on fleicanide) and presbyesophagus who presented with worsening abdominal discomfort and palpitations.   Dysphagia/Odynophagia: Due patient's history of PVCs and PACs and patient presentation for concern for palpitation Trp checked at presentation, but was negative. Patient evaluated by GI who did EGD procedure. EGD noted normal esophagus, gastropathy, and a normal duodenum. Patient had samples taken from esophagus and stomach. GI recommended that patient continue PPI BID and take altoid before every meal until follow-up outpatient. Patient was started back on her diet and instructed to take her time eating and take small bites and chew well. Patient initially had some trouble but was able to eat her 2nd meals and gradually increased PACs/PVCs: Patient flecaninde was initially held due to NPO diet for EGD. Flecanide was resumed post-procedure. Anxiety: Concerned that patient hx of anxiety was increasing painful symptoms associated with eating and swallowing. Started buspar 5mg  BID, to which she  responded well.  Hypertension: Patient BP well controlled and monitored during inpatient. Held HTN meds throughout hospitalization.     Discharge Vitals:   BP 102/61 (BP Location: Right Arm)    Pulse 64    Temp 98.1 F (36.7 C) (Oral)    Resp 16    Ht 5' 5.5" (1.664 m)    Wt 57.1 kg    LMP 05/24/1970    SpO2 95%    BMI 20.63 kg/m   Pertinent Labs, Studies, and Procedures:  EGD noted normal esophagus, gastropathy, and a normal  duodenum. Stomach body and fundus biopsy note mild chronic gastritis. Antrum biopsy negative for H Pylori. Esophagus was biospy was normal.  Discharge Instructions: Discharge Instructions    Call MD for:  difficulty breathing, headache or visual disturbances   Complete by: As directed    Call MD for:  extreme fatigue   Complete by: As directed    Call MD for:  hives   Complete by: As directed    Call MD for:  persistant dizziness or light-headedness   Complete by: As directed    Call MD for:  persistant nausea and vomiting   Complete by: As directed    Call MD for:  redness, tenderness, or signs of infection (pain, swelling, redness, odor or green/yellow discharge around incision site)   Complete by: As directed    Call MD for:  severe uncontrolled pain   Complete by: As directed    Call MD for:  temperature >100.4   Complete by: As directed    Diet - low sodium heart healthy   Complete by: As directed    Increase activity slowly   Complete by: As directed       Signed: Freida Busman, MD 02/08/2020, 2:51 PM   Pager: [phone]

## 2020-02-12 ENCOUNTER — Other Ambulatory Visit: Payer: Self-pay | Admitting: Internal Medicine

## 2020-02-12 DIAGNOSIS — R198 Other specified symptoms and signs involving the digestive system and abdomen: Secondary | ICD-10-CM

## 2020-02-13 ENCOUNTER — Telehealth: Payer: Self-pay | Admitting: Gastroenterology

## 2020-02-13 NOTE — Telephone Encounter (Signed)
The pt states she took Pepcid and has a lot of bloating and abd discomfort.  She was advised to try the altoids that was recommended by Dr Tarri Glenn and Dr Ardis Hughs.  She says she has her son going to pick up the altoids now and will try that.  She will call back if she gets not relief.

## 2020-02-14 ENCOUNTER — Emergency Department (HOSPITAL_COMMUNITY): Payer: Medicare Other

## 2020-02-14 ENCOUNTER — Emergency Department (HOSPITAL_COMMUNITY)
Admission: EM | Admit: 2020-02-14 | Discharge: 2020-02-15 | Disposition: A | Discharge status: Home / Self Care | Payer: Medicare Other | Attending: Emergency Medicine | Admitting: Emergency Medicine

## 2020-02-14 ENCOUNTER — Encounter (HOSPITAL_COMMUNITY): Payer: Self-pay

## 2020-02-14 ENCOUNTER — Other Ambulatory Visit: Payer: Self-pay

## 2020-02-14 ENCOUNTER — Ambulatory Visit: Payer: Medicare Other | Admitting: Dietician

## 2020-02-14 DIAGNOSIS — I700 Atherosclerosis of aorta: Secondary | ICD-10-CM | POA: Diagnosis not present

## 2020-02-14 DIAGNOSIS — Z85828 Personal history of other malignant neoplasm of skin: Secondary | ICD-10-CM | POA: Diagnosis not present

## 2020-02-14 DIAGNOSIS — R52 Pain, unspecified: Secondary | ICD-10-CM | POA: Diagnosis not present

## 2020-02-14 DIAGNOSIS — D649 Anemia, unspecified: Secondary | ICD-10-CM

## 2020-02-14 DIAGNOSIS — Z79899 Other long term (current) drug therapy: Secondary | ICD-10-CM | POA: Insufficient documentation

## 2020-02-14 DIAGNOSIS — R109 Unspecified abdominal pain: Secondary | ICD-10-CM

## 2020-02-14 DIAGNOSIS — R1013 Epigastric pain: Secondary | ICD-10-CM | POA: Insufficient documentation

## 2020-02-14 DIAGNOSIS — R1084 Generalized abdominal pain: Secondary | ICD-10-CM | POA: Diagnosis not present

## 2020-02-14 DIAGNOSIS — E785 Hyperlipidemia, unspecified: Secondary | ICD-10-CM | POA: Diagnosis not present

## 2020-02-14 DIAGNOSIS — I10 Essential (primary) hypertension: Secondary | ICD-10-CM | POA: Diagnosis not present

## 2020-02-14 DIAGNOSIS — Z7982 Long term (current) use of aspirin: Secondary | ICD-10-CM | POA: Diagnosis not present

## 2020-02-14 DIAGNOSIS — K5730 Diverticulosis of large intestine without perforation or abscess without bleeding: Secondary | ICD-10-CM | POA: Diagnosis not present

## 2020-02-14 DIAGNOSIS — E876 Hypokalemia: Secondary | ICD-10-CM | POA: Diagnosis not present

## 2020-02-14 DIAGNOSIS — J984 Other disorders of lung: Secondary | ICD-10-CM | POA: Diagnosis not present

## 2020-02-14 DIAGNOSIS — K828 Other specified diseases of gallbladder: Secondary | ICD-10-CM | POA: Diagnosis not present

## 2020-02-14 DIAGNOSIS — Z90710 Acquired absence of both cervix and uterus: Secondary | ICD-10-CM | POA: Diagnosis not present

## 2020-02-14 DIAGNOSIS — Z87891 Personal history of nicotine dependence: Secondary | ICD-10-CM | POA: Insufficient documentation

## 2020-02-14 LAB — LIPASE, BLOOD: Lipase: 33 U/L (ref 11–51)

## 2020-02-14 LAB — CBC WITH DIFFERENTIAL/PLATELET
Abs Immature Granulocytes: 0.01 10*3/uL (ref 0.00–0.07)
Basophils Absolute: 0 10*3/uL (ref 0.0–0.1)
Basophils Relative: 1 %
Eosinophils Absolute: 0.1 10*3/uL (ref 0.0–0.5)
Eosinophils Relative: 1 %
HCT: 36.5 % (ref 36.0–46.0)
Hemoglobin: 11.8 g/dL — ABNORMAL LOW (ref 12.0–15.0)
Immature Granulocytes: 0 %
Lymphocytes Relative: 22 %
Lymphs Abs: 1.2 10*3/uL (ref 0.7–4.0)
MCH: 31.5 pg (ref 26.0–34.0)
MCHC: 32.3 g/dL (ref 30.0–36.0)
MCV: 97.3 fL (ref 80.0–100.0)
Monocytes Absolute: 0.5 10*3/uL (ref 0.1–1.0)
Monocytes Relative: 10 %
Neutro Abs: 3.5 10*3/uL (ref 1.7–7.7)
Neutrophils Relative %: 66 %
Platelets: 234 10*3/uL (ref 150–400)
RBC: 3.75 MIL/uL — ABNORMAL LOW (ref 3.87–5.11)
RDW: 13.3 % (ref 11.5–15.5)
WBC: 5.3 10*3/uL (ref 4.0–10.5)
nRBC: 0 % (ref 0.0–0.2)

## 2020-02-14 LAB — COMPREHENSIVE METABOLIC PANEL
ALT: 35 U/L (ref 0–44)
AST: 81 U/L — ABNORMAL HIGH (ref 15–41)
Albumin: 3.7 g/dL (ref 3.5–5.0)
Alkaline Phosphatase: 62 U/L (ref 38–126)
Anion gap: 11 (ref 5–15)
BUN: 13 mg/dL (ref 8–23)
CO2: 25 mmol/L (ref 22–32)
Calcium: 8.9 mg/dL (ref 8.9–10.3)
Chloride: 106 mmol/L (ref 98–111)
Creatinine, Ser: 0.71 mg/dL (ref 0.44–1.00)
GFR calc Af Amer: >60 mL/min (ref >60)
GFR calc non Af Amer: >60 mL/min (ref >60)
Glucose, Bld: 100 mg/dL — ABNORMAL HIGH (ref 70–99)
Potassium: 3.3 mmol/L — ABNORMAL LOW (ref 3.5–5.1)
Sodium: 142 mmol/L (ref 135–145)
Total Bilirubin: 1 mg/dL (ref 0.3–1.2)
Total Protein: 6.2 g/dL — ABNORMAL LOW (ref 6.5–8.1)

## 2020-02-14 LAB — TROPONIN I (HIGH SENSITIVITY): Troponin I (High Sensitivity): 5 ng/L (ref <18)

## 2020-02-14 MED ORDER — SUCRALFATE 1 G PO TABS
1.0000 g | ORAL_TABLET | Freq: Three times a day (TID) | ORAL | Status: DC
  Filled 2020-02-14: qty 1

## 2020-02-14 MED ORDER — SODIUM CHLORIDE 0.9 % IV SOLN: INTRAVENOUS | Status: DC

## 2020-02-14 MED ORDER — LIDOCAINE VISCOUS HCL 2 % MT SOLN
15.0000 mL | Freq: Once | OROMUCOSAL | Status: DC
  Filled 2020-02-14: qty 15

## 2020-02-14 MED ORDER — SODIUM CHLORIDE 0.9 % IV BOLUS
500.0000 mL | Freq: Once | INTRAVENOUS | Status: AC
  Administered 2020-02-14: 500 mL via INTRAVENOUS

## 2020-02-14 MED ORDER — SUCRALFATE 1 G PO TABS: 1.0000 g | ORAL_TABLET | Freq: Four times a day (QID) | ORAL | 0 refills | Status: DC

## 2020-02-14 MED ORDER — LORAZEPAM 2 MG/ML IJ SOLN
0.5000 mg | Freq: Once | INTRAMUSCULAR | Status: AC
  Administered 2020-02-14: 0.5 mg via INTRAVENOUS
  Filled 2020-02-14: qty 1

## 2020-02-14 MED ORDER — ALUM & MAG HYDROXIDE-SIMETH 200-200-20 MG/5ML PO SUSP
30.0000 mL | Freq: Once | ORAL | Status: DC
  Filled 2020-02-14: qty 30

## 2020-02-14 MED ORDER — IOHEXOL 9 MG/ML PO SOLN
ORAL | Status: AC
  Filled 2020-02-14: qty 1000

## 2020-02-14 NOTE — ED Triage Notes (Signed)
Pt from home with [facility] for epigastric pain that radiates to the right side. Pt had her esophagus stretched last Tuesday but this pain started a couple hours ago. Pt given 23mcg fentanyl en route. Pt arrives alert and oriented. VSS

## 2020-02-14 NOTE — Discharge Instructions (Addendum)
  Take your famotidine (Pepcid) every night at bedtime.  Follow-up with your physician for recheck next week if not better

## 2020-02-14 NOTE — ED Provider Notes (Signed)
[facility] EMERGENCY DEPARTMENT Provider Note   CSN: [id number] Arrival date & time: 02/14/20  1811     History Chief Complaint  Patient presents with  . Abdominal Pain    [name] [name] is a 86 y.o. female.  86 year old female presents with epigastric stomach times several days.  Patient was admitted 7 days ago due to dysphagia.  That admission was reviewed and patient had EGD performed which did not show signs of pathology.  Since she has been discharged, she states that she has trouble when she attempts to swallow.  Patient denies any fever or chills.  She denies any anginal type chest pain.  Has been medicating with famotidine with limited relief.  Called EMS and was given fentanyl and transported here.        Past Medical History:  Diagnosis Date  . Anemia    iron def  . Anxiety   . Arrhythmia    paroxysmal SVT and symtomatic PAC's  . Diverticulosis    colon  . Elevated troponin   . GERD 04/02/2008   Qualifier: Diagnosis of  By: Jenny Reichmann MD, Hunt Oris   . History of colonic polyps    cecal  . Hypertension   . 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 Osteoporosis 07/21/2011   t score -2.7  . Palpitations 05/24/2017  . Stricture esophagus   . Vitamin D deficiency    2010    Patient Active Problem List   Diagnosis Date Noted  . Esophageal dysphagia 02/05/2020  . Gastritis and gastroduodenitis   . Presbyesophagus 01/17/2020  . Difficulty swallowing pills 08/17/2019  . Sore throat 08/13/2019  . Varicose veins of both legs with edema 06/04/2019  . Hyperlipidemia 06/04/2019  . History of repair of rectocele 03/13/2019  . Allergic reaction caused by a drug 03/13/2019  . Rotator cuff arthropathy, right 04/08/2016  . Arthritis of knee 03/25/2016  . Presbycusis of both ears 01/15/2016  . Tinnitus of right ear 01/15/2016  . Skin cancer of arm 09/26/2014  . Palpitation 09/28/2012  . Rhinitis  12/25/2011  . History of TIA (transient ischemic attack) 07/19/2011  . ATRIAL PREMATURE BEATS 03/20/2009  . ANEMIA-IRON DEFICIENCY 04/02/2008  . Anxiety 04/02/2008  . Essential hypertension 04/02/2008  . OSTEOPOROSIS 04/02/2008    Past Surgical History:  Procedure Laterality Date  . A&P, enterocele repair     s/p 2003  . ABDOMINAL HYSTERECTOMY    . BIOPSY  02/05/2020   Procedure: BIOPSY;  Surgeon: Thornton Park, MD;  Location: [facility];  Service: Gastroenterology;;  . BREAST REDUCTION SURGERY     s/p 1996      PATIENT DENIES BREAST REDUCTION   . ESOPHAGEAL DILATION  02/05/2020   Procedure: ESOPHAGEAL DILATION;  Surgeon: Thornton Park, MD;  Location: [facility];  Service: Gastroenterology;;  . ESOPHAGOGASTRODUODENOSCOPY (EGD) WITH PROPOFOL N/A 02/05/2020   Procedure: ESOPHAGOGASTRODUODENOSCOPY (EGD) WITH PROPOFOL;  Surgeon: Thornton Park, MD;  Location: [facility];  Service: Gastroenterology;  Laterality: N/A;  . HEMORRHOID SURGERY    . LEFT HEART CATH AND CORONARY ANGIOGRAPHY N/A 05/25/2017   Procedure: LEFT HEART CATH AND CORONARY ANGIOGRAPHY;  Surgeon: Jettie Booze, MD;  Location: [facility];  Service: Cardiovascular;  Laterality: N/A;     OB History    Gravida  2   Para  2   Term      Preterm      AB  Living  1     SAB      TAB      Ectopic      Multiple      Live Births              Family History  Problem Relation Age of Onset  . Stroke Father        @ 73  . Hypertension Father   . Hypertension Sister   . Cancer Sister 14       brain  . Breast cancer Other 50       breast  . Breast cancer Daughter 86       spread to her brain  . Cancer Other 58       colon/MELANOMA  . Stroke Mother        medication induced  . Kidney disease Brother   . Cancer Brother     Social History   Tobacco Use  . Smoking status: Former Smoker    Quit date: 05/24/1968    Years since quitting: 51.7  . Smokeless tobacco: Never  Used  . Tobacco comment: only smoked 7 years  Vaping Use  . Vaping Use: Never used  Substance Use Topics  . Alcohol use: No    Alcohol/week: 0.0 standard drinks  . Drug use: No    Home Medications Prior to Admission medications   Medication Sig Start Date End Date Taking? Authorizing Provider  aspirin EC 325 MG tablet Take 325 mg by mouth daily as needed (headache).   Yes [provider]  Cod Liver Oil CAPS Take 1 capsule by mouth daily.   Yes [provider]  famotidine (PEPCID) 20 MG tablet Take 1 tablet (20 mg total) by mouth 2 (two) times daily. Patient taking differently: Take 20 mg by mouth daily as needed for heartburn or indigestion.  02/01/20  Yes Lacinda Axon, MD  flecainide (TAMBOCOR) 50 MG tablet Take 1 tablet (50 mg total) by mouth See admin instructions. Take 2 tablets (100 mg) by mouth every morning and 1 tablet (50 mg) at night 08/02/19  Yes Baldwin Jamaica, PA-C  LORazepam (ATIVAN) 0.5 MG tablet Take 0.25 mg by mouth every 8 (eight) hours as needed for anxiety.   Yes [provider]  busPIRone (BUSPAR) 5 MG tablet Take 1 tablet (5 mg total) by mouth 2 (two) times daily. 02/07/20   Freida Busman, MD    Allergies    Other, Pantoprazole, Zyrtec [cetirizine], and Ciprofloxacin  Review of Systems   Review of Systems  All other systems reviewed and are negative.   Physical Exam Updated Vital Signs BP (!) 142/66   Pulse 62   Temp 98.4 F (36.9 C) (Oral)   Resp 18   LMP 05/24/1970   SpO2 98%   Physical Exam Vitals and nursing note reviewed.  Constitutional:      General: She is not in acute distress.    Appearance: Normal appearance. She is well-developed. She is not toxic-appearing.  HENT:     Head: Normocephalic and atraumatic.  Eyes:     General: Lids are normal.     Conjunctiva/sclera: Conjunctivae normal.     Pupils: Pupils are equal, round, and reactive to light.  Neck:     Thyroid: No thyroid mass.     Trachea: No  tracheal deviation.  Cardiovascular:     Rate and Rhythm: Normal rate and regular rhythm.     Heart sounds: Normal heart sounds. No murmur  heard.  No gallop.   Pulmonary:     Effort: Pulmonary effort is normal. No respiratory distress.     Breath sounds: Normal breath sounds. No stridor. No decreased breath sounds, wheezing, rhonchi or rales.  Abdominal:     General: Bowel sounds are normal. There is no distension.     Palpations: Abdomen is soft.     Tenderness: There is abdominal tenderness in the epigastric area. There is no rebound.  Musculoskeletal:        General: No tenderness. Normal range of motion.     Cervical back: Normal range of motion and neck supple.  Skin:    General: Skin is warm and dry.     Findings: No abrasion or rash.  Neurological:     Mental Status: She is alert and oriented to person, place, and time.     GCS: GCS eye subscore is 4. GCS verbal subscore is 5. GCS motor subscore is 6.     Cranial Nerves: No cranial nerve deficit.     Sensory: No sensory deficit.  Psychiatric:        Attention and Perception: Attention normal.        Mood and Affect: Mood is anxious.        Speech: Speech normal.        Behavior: Behavior normal.     ED Results / Procedures / Treatments   Labs (all labs ordered are listed, but only abnormal results are displayed) Labs Reviewed  CBC WITH DIFFERENTIAL/PLATELET  COMPREHENSIVE METABOLIC PANEL  LIPASE, BLOOD  TROPONIN I (HIGH SENSITIVITY)    EKG EKG Interpretation  Date/Time:  Thursday February 14 2020 18:28:51 EDT Ventricular Rate:  64 PR Interval:    QRS Duration: 97 QT Interval:  410 QTC Calculation: 423 R Axis:   78 Text Interpretation: Sinus rhythm Borderline prolonged PR interval Low voltage, precordial leads Confirmed by Lacretia Leigh ([id number]) on 02/14/2020 6:35:48 PM   Radiology No results found.  Procedures Procedures (including critical care time)  Medications Ordered in ED Medications  0.9 %   sodium chloride infusion (has no administration in time range)    ED Course  I have reviewed the triage vital signs and the nursing notes.  Pertinent labs & imaging results that were available during my care of the patient were reviewed by me and considered in my medical decision making (see chart for details).    MDM Rules/Calculators/A&P                         Patient's cardiac enzymes are negative.  Patient is EKG without acute findings. Patient with likely GERD here.  However have ordered abdominal CT which is pending at this time.  She does feel better after medication.  Care assigned to Dr. Roxanne Mins Final Clinical Impression(s) / ED Diagnoses Final diagnoses:  None    Rx / DC Orders ED Discharge Orders    None       Lacretia Leigh, MD 02/14/20 2324

## 2020-02-15 ENCOUNTER — Ambulatory Visit: Payer: Medicare Other | Admitting: Gastroenterology

## 2020-02-15 DIAGNOSIS — R1013 Epigastric pain: Secondary | ICD-10-CM | POA: Diagnosis not present

## 2020-02-15 MED ORDER — POTASSIUM CHLORIDE CRYS ER 20 MEQ PO TBCR
40.0000 meq | EXTENDED_RELEASE_TABLET | Freq: Once | ORAL | Status: AC
  Administered 2020-02-15: 40 meq via ORAL
  Filled 2020-02-15: qty 2

## 2020-02-15 NOTE — ED Provider Notes (Signed)
Care assumed from Dr. Zenia Resides, patient with suspected GERD pending CT of abdomen and pelvis.  CT shows evidence of diverticulosis, but no acute process.  She will be treated for presumed GERD.  She currently takes famotidine on a as needed basis, advised to take it every day.  Unfortunately, she has an allergy to proton pump inhibitors.  She is to follow-up with her PCP.  Results for orders placed or performed during the hospital encounter of 02/14/20  CBC with Differential/Platelet  Result Value Ref Range   WBC 5.3 4.0 - 10.5 K/uL   RBC 3.75 (L) 3.87 - 5.11 MIL/uL   Hemoglobin 11.8 (L) 12.0 - 15.0 g/dL   HCT 36.5 36 - 46 %   MCV 97.3 80.0 - 100.0 fL   MCH 31.5 26.0 - 34.0 pg   MCHC 32.3 30.0 - 36.0 g/dL   RDW 13.3 11.5 - 15.5 %   Platelets 234 150 - 400 K/uL   nRBC 0.0 0.0 - 0.2 %   Neutrophils Relative % 66 %   Neutro Abs 3.5 1.7 - 7.7 K/uL   Lymphocytes Relative 22 %   Lymphs Abs 1.2 0.7 - 4.0 K/uL   Monocytes Relative 10 %   Monocytes Absolute 0.5 0 - 1 K/uL   Eosinophils Relative 1 %   Eosinophils Absolute 0.1 0 - 0 K/uL   Basophils Relative 1 %   Basophils Absolute 0.0 0 - 0 K/uL   Immature Granulocytes 0 %   Abs Immature Granulocytes 0.01 0.00 - 0.07 K/uL  Comprehensive metabolic panel  Result Value Ref Range   Sodium 142 135 - 145 mmol/L   Potassium 3.3 (L) 3.5 - 5.1 mmol/L   Chloride 106 98 - 111 mmol/L   CO2 25 22 - 32 mmol/L   Glucose, Bld 100 (H) 70 - 99 mg/dL   BUN 13 8 - 23 mg/dL   Creatinine, Ser 0.71 0.44 - 1.00 mg/dL   Calcium 8.9 8.9 - 10.3 mg/dL   Total Protein 6.2 (L) 6.5 - 8.1 g/dL   Albumin 3.7 3.5 - 5.0 g/dL   AST 81 (H) 15 - 41 U/L   ALT 35 0 - 44 U/L   Alkaline Phosphatase 62 38 - 126 U/L   Total Bilirubin 1.0 0.3 - 1.2 mg/dL   GFR calc non Af Amer >60 >60 mL/min   GFR calc Af Amer >60 >60 mL/min   Anion gap 11 5 - 15  Lipase, blood  Result Value Ref Range   Lipase 33 11 - 51 U/L  Troponin I (High Sensitivity)  Result Value Ref Range    Troponin I (High Sensitivity) 5 <18 ng/L   CT Abdomen Pelvis Wo Contrast  Result Date: 02/14/2020 CLINICAL DATA:  Epigastric pain radiating to right side, recent esophageal dilatation EXAM: CT ABDOMEN AND PELVIS WITHOUT CONTRAST TECHNIQUE: Multidetector CT imaging of the abdomen and pelvis was performed following the standard protocol without IV contrast. COMPARISON:  02/16/2019, 02/14/2020 FINDINGS: Lower chest: Bibasilar areas of scarring are unchanged. No acute pleural or parenchymal lung disease. Hepatobiliary: Gallbladder is moderately distended without evidence of calcified gallstones or cholecystitis. Unenhanced imaging of the liver is unremarkable. Pancreas: Unremarkable. No pancreatic ductal dilatation or surrounding inflammatory changes. Spleen: Normal in size without focal abnormality. Adrenals/Urinary Tract: No urinary tract calculi or obstructive uropathy. Bladder is unremarkable. The adrenals are normal. Stomach/Bowel: No bowel obstruction or ileus. No bowel wall thickening or inflammatory change. Scattered diverticulosis of the sigmoid colon again noted. Vascular/Lymphatic: Aortic atherosclerosis.  No enlarged abdominal or pelvic lymph nodes. Reproductive: Status post hysterectomy. No adnexal masses. Other: No free fluid or free gas. No abdominal wall hernia. Musculoskeletal: No acute or destructive bony lesions. Reconstructed images demonstrate no additional findings. IMPRESSION: 1. No urinary tract calculi or obstructive uropathy. 2. Moderately distended gallbladder without evidence of calcified gallstones or cholecystitis. 3. Sigmoid diverticulosis without diverticulitis. 4. Aortic Atherosclerosis (ICD10-I70.0). Electronically Signed   By: Randa Ngo M.D.   On: 02/14/2020 23:41   DG ABD ACUTE 2+V W 1V CHEST  Result Date: 02/14/2020 CLINICAL DATA:  Abdominal pain EXAM: X-RAY ABDOMEN 3 VIEW COMPARISON:  Chest 12/15/2019.  Abdomen 08/07/2014 FINDINGS: Cardiac and mediastinal contours  normal. Atherosclerotic calcification aortic arch. Pulmonary vascularity normal. Lungs are clear without infiltrate or effusion. Mild scarring in the left lung base unchanged. Normal bowel gas pattern. No obstruction or ileus or free air. Atherosclerotic aorta. No acute skeletal abnormality. IMPRESSION: Negative abdominal radiographs. No acute cardiopulmonary disease. Left lower lobe scarring. Electronically Signed   By: Franchot Gallo M.D.   On: 02/14/2020 19:48   Images viewed by me.    Delora Fuel, MD 36/68/15 [phone]

## 2020-02-15 NOTE — ED Notes (Signed)
Discharge instructions discussed with pt. Pt verbalized understanding. Pt stable and ambulatory. No signature pad available. 

## 2020-02-20 ENCOUNTER — Ambulatory Visit: Payer: Medicare Other | Admitting: Skilled Nursing Facility1

## 2020-03-03 ENCOUNTER — Telehealth: Payer: Self-pay | Admitting: Gastroenterology

## 2020-03-03 DIAGNOSIS — R198 Other specified symptoms and signs involving the digestive system and abdomen: Secondary | ICD-10-CM

## 2020-03-03 NOTE — Telephone Encounter (Signed)
Pt states that she has pain in upper stomach like burning sensation to the point that she cannot eat anything. She would like some advise.

## 2020-03-03 NOTE — Telephone Encounter (Signed)
Linna Hoff, It appears that you follow this patient in the office. I performed her recent EGD on a purple block day. Will defer further recommendations to you. Thanks. KLB

## 2020-03-03 NOTE — Telephone Encounter (Signed)
Spoke with pt and she states her stomach is bothering her, she feels very full but has not eaten anything. Reports the famotidine she had worked for her and she could eat. States the pepcid did not work for her. Discussed with her they were the same and she states generic worked much better for her. She has tried the altoids and does not think they are helping her either. Pt wants to know what Dr. Tarri Glenn recommends. Please advise.

## 2020-03-04 MED ORDER — FAMOTIDINE 20 MG PO TABS: 20.0000 mg | ORAL_TABLET | Freq: Two times a day (BID) | ORAL | 1 refills | Status: DC

## 2020-03-04 NOTE — Telephone Encounter (Signed)
She should use the famotidine instead of hte pepcid if she believes the generic is more helpful.  Otherwise no recommendations.  Fu with me already scheduled in November.

## 2020-03-04 NOTE — Telephone Encounter (Signed)
Spoke with pt and she is aware.

## 2020-03-05 ENCOUNTER — Telehealth: Payer: Self-pay | Admitting: Dietician

## 2020-03-05 NOTE — Telephone Encounter (Addendum)
Nutrition Phone call: Call to patient per referral from Dr. Sherry Ruffing for recommendations for presbyesophagus. She states she has pain after a breakfast of scrambled eggs, toast and butter today. She is in bed with a heating pad on, reports taking the famotidine and states she plans to take an alkaseltzer soon. Her speech is racing and she mentions that she was sorry that she told her son if she had a gun she might kill herself,  but then said she wishes she would not have said that because she would never hurt herself or him by doing that. I was unable to get her to answer th depression screening questions over the phone.   She is  vegetarian, doesn't cook much,  son lives next door, she drinks Ensure original  watered down and sometimes high protein but states " even that doesn't work at times". States she also has constipation is concerned about her fiber intake and weight loss- having stools every 3 days after drinking prune juice. PLan: encouraged her to drink prune juice daily if needed; consider a fiber supplement,  eat soft, moist and high calorie foods every few hours. She agreed to meet me  tomorrow during her appointment here. Recommend counseling for her anxiety.  Debera Lat, RD 03/05/2020 3:02 PM.

## 2020-03-06 ENCOUNTER — Other Ambulatory Visit: Payer: Self-pay

## 2020-03-06 ENCOUNTER — Ambulatory Visit: Payer: Medicare Other | Admitting: Dietician

## 2020-03-06 ENCOUNTER — Encounter: Payer: Self-pay | Admitting: Internal Medicine

## 2020-03-06 ENCOUNTER — Ambulatory Visit (INDEPENDENT_AMBULATORY_CARE_PROVIDER_SITE_OTHER): Payer: Medicare Other | Admitting: Internal Medicine

## 2020-03-06 VITALS — BP 137/71 | HR 69 | Temp 98.0°F | Ht 65.5 in | Wt 129.2 lb

## 2020-03-06 DIAGNOSIS — R1319 Other dysphagia: Secondary | ICD-10-CM

## 2020-03-06 DIAGNOSIS — F419 Anxiety disorder, unspecified: Secondary | ICD-10-CM | POA: Diagnosis not present

## 2020-03-06 DIAGNOSIS — K2289 Other specified disease of esophagus: Secondary | ICD-10-CM | POA: Diagnosis not present

## 2020-03-06 NOTE — Progress Notes (Signed)
CC: Dysphagia  HPI:  [name] is a 86 y.o. female with a past medical history stated below and presents today for dysphagia. Please see problem based assessment and plan for additional details.  Past Medical History:  Diagnosis Date  . Anemia    iron def  . Anxiety   . Arrhythmia    paroxysmal SVT and symtomatic PAC's  . Diverticulosis    colon  . Elevated troponin   . GERD 04/02/2008   Qualifier: Diagnosis of  By: Jenny Reichmann MD, [facility]is   . History of colonic polyps    cecal  . Hypertension   . 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 Osteoporosis 07/21/2011   t score -2.7  . Palpitations 05/24/2017  . Stricture esophagus   . Vitamin D deficiency    2010    Current Outpatient Medications on File Prior to Visit  Medication Sig Dispense Refill  . aspirin EC 325 MG tablet Take 325 mg by mouth daily as needed (headache).    . busPIRone (BUSPAR) 5 MG tablet Take 1 tablet (5 mg total) by mouth 2 (two) times daily. 180 tablet 0  . Cod Liver Oil CAPS Take 1 capsule by mouth daily.    . famotidine (PEPCID) 20 MG tablet Take 1 tablet (20 mg total) by mouth 2 (two) times daily. 90 tablet 1  . flecainide (TAMBOCOR) 50 MG tablet Take 1 tablet (50 mg total) by mouth See admin instructions. Take 2 tablets (100 mg) by mouth every morning and 1 tablet (50 mg) at night 270 tablet 1  . LORazepam (ATIVAN) 0.5 MG tablet Take 0.25 mg by mouth every 8 (eight) hours as needed for anxiety.    . sucralfate (CARAFATE) 1 g tablet Take 1 tablet (1 g total) by mouth 4 (four) times daily. 30 tablet 0   No current facility-administered medications on file prior to visit.    Family History  Problem Relation Age of Onset  . Stroke Father        @ 52  . Hypertension Father   . Hypertension Sister   . Cancer Sister 48       brain  . Breast cancer Other 33       breast  . Breast cancer Daughter 43       spread to her brain  . Cancer Other  58       colon/MELANOMA  . Stroke Mother        medication induced  . Kidney disease Brother   . Cancer Brother     Social History   Socioeconomic History  . Marital status: Widowed    Spouse name: Not on file  . Number of children: 2  . Years of education: Not on file  . Highest education level: Not on file  Occupational History  . Occupation: Retired  Tobacco Use  . Smoking status: Former Smoker    Quit date: 05/24/1968    Years since quitting: 51.8  . Smokeless tobacco: Never Used  . Tobacco comment: only smoked 7 years  Vaping Use  . Vaping Use: Never used  Substance and Sexual Activity  . Alcohol use: No    Alcohol/week: 0.0 standard drinks  . Drug use: No  . Sexual activity: Never    Comment: 1st intercourse 43 yo-1 partner  Other Topics Concern  . Not on file  Social History Narrative  . Not on file  Social Determinants of Health   Financial Resource Strain:   . Difficulty of Paying Living Expenses: Not on file  Food Insecurity:   . Worried About Charity fundraiser in the Last Year: Not on file  . Ran Out of Food in the Last Year: Not on file  Transportation Needs:   . Lack of Transportation (Medical): Not on file  . Lack of Transportation (Non-Medical): Not on file  Physical Activity:   . Days of Exercise per Week: Not on file  . Minutes of Exercise per Session: Not on file  Stress:   . Feeling of Stress : Not on file  Social Connections:   . Frequency of Communication with Friends and Family: Not on file  . Frequency of Social Gatherings with Friends and Family: Not on file  . Attends Religious Services: Not on file  . Active Member of Clubs or Organizations: Not on file  . Attends Archivist Meetings: Not on file  . Marital Status: Not on file  Intimate Partner Violence:   . Fear of Current or Ex-Partner: Not on file  . Emotionally Abused: Not on file  . Physically Abused: Not on file  . Sexually Abused: Not on file    Review of  Systems: ROS negative except for what is noted on the assessment and plan.  Vitals:   03/06/20 1404  BP: 137/71  Pulse: 69  Temp: 98 F (36.7 C)  TempSrc: Oral  SpO2: 98%  Weight: 129 lb 3.2 oz (58.6 kg)  Height: 5' 5.5" (1.664 m)     Physical Exam: Physical Exam Constitutional:      Appearance: Normal appearance.  HENT:     Head: Normocephalic and atraumatic.  Eyes:     Extraocular Movements: Extraocular movements intact.  Cardiovascular:     Rate and Rhythm: Normal rate.     Pulses: Normal pulses.     Heart sounds: Normal heart sounds.  Pulmonary:     Effort: Pulmonary effort is normal.     Breath sounds: Normal breath sounds.  Abdominal:     General: Bowel sounds are normal.     Palpations: Abdomen is soft.     Tenderness: There is no abdominal tenderness.  Musculoskeletal:        General: Normal range of motion.     Cervical back: Normal range of motion.     Right lower leg: No edema.     Left lower leg: No edema.  Skin:    General: Skin is warm and dry.  Neurological:     Mental Status: She is alert and oriented to person, place, and time. Mental status is at baseline.  Psychiatric:        Mood and Affect: Mood normal.     Assessment & Plan:   See Encounters Tab for problem based charting.  Patient discussed with Dr. Eulogio Ditch, D.O. [facility], PGY-2 Pager: [phone], Phone: [phone] Date 03/07/2020 Time 6:31 AM

## 2020-03-06 NOTE — Patient Instructions (Signed)
Thank you, [name] for allowing [place] to provide your care today. Today we discussed Esophageal pain.    I have ordered the following labs for you:  Lab Orders  No laboratory test(s) ordered today     I will call if any are abnormal. All of your labs can be accessed through "My Chart".  I have place a referrals to none, but keep your GI appointment..  I have ordered the following tests: none   I have ordered the following medication/changed the following medications:  1. continue Pepsid and Curafate   Please follow-up after you rGI appointment so we can discuss the plan moving forward. Marland Kitchen  GOALS:   Should you have any questions or concerns please call the internal medicine clinic at [phone].    [name], D.O. [place]

## 2020-03-06 NOTE — Telephone Encounter (Signed)
Thank you for letting me know. I am very familiar with this patient from her last hospitalization.

## 2020-03-06 NOTE — Assessment & Plan Note (Addendum)
Patient states that she developed worsening epigastric pain this past weekend.  She states that the week prior she was eating fine but on Sunday night she developed some epigastric pain that radiated to the substernal region.  Care ice the pain is a throbbing pain that would last several hours without relief.  The symptoms are very anxiety provoking for her.  She has had several instances of epigastric pain secondary to esophageal dysmotility with her most recent admission a month ago for this reason.  Patient had an EGD at that time which did not show any significant pathologic process. She is on Pepcid and Carafate which she admits to inconsistently taking.  Patient was originally prescribed a PPI but admits to significant reaction to PPIs in the past.  She states that the pain can be so significant that she is unable to do anything while it is going on and it limits her ability to eat.  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  I do believe that the patient has a component of anxiety that is being undertreated.  She was recently prescribed buspirone with the intention of titrating the medication up.  This remains challenging as she has a difficult time tolerating p.o. medications consistently.  She states that she has not started this medication in the outpatient setting.  We have discussed medication management for her symptoms.  Unfortunately many of the medications can have significant side effects including arrhythmias.  Patient does have a history of PACs and is on flecainide which is controlled her symptoms.  Furthermore, I discussed a referral to general surgery/GI for gastric tube placement.  Patient states that she does not want a gastric tube at this time but admits to having significant symptoms to the extent in which she is considered hospice.   I do believe that she  would benefit from having her anxiety consistently treated.  I encouraged her to consistently take the buspirone.  I will prescribe her sublingual nitroglycerin in hopes that this will improve her symptoms.  She has a follow-up appointment with GI in a couple weeks.  Counseled her on making a follow-up appointment with our office shortly after to discuss a game plan.  I will encourage her to bring her son to this appointment so that we can have a family discussion about their goals of care.  Plan: -Start nitroglycerin sublingual today -Continue Pepcid and Carafate -Follow-up in a month.

## 2020-03-06 NOTE — Progress Notes (Addendum)
Nutrition follow up note: Saw [name] [name] briefly today while she was seeing Dr. [name]. I provided her with my contact information, information on counseling and Esophageal Soft Food Diet Guidelines .  Her unintentional weight loss recorded is 14.8#(10% Usual bodyweight)  in the past 10 months;~10#(~7%) in the past 3 months which are both significant and concerning for malnutrition.  However, she gained eight in the past month and her BMI is low/underweight  for her age. ( ideal is 23-30 for elderly) Plan: goal weight  >142#; call to follow up  in 1-2 weeks to reassess intake, weight status, social determinants of health.  Debera Lat, RD 03/06/2020 3:25 PM.

## 2020-03-07 ENCOUNTER — Encounter: Payer: Self-pay | Admitting: Internal Medicine

## 2020-03-07 MED ORDER — NITROGLYCERIN 0.3 MG SL SUBL: 0.3000 mg | SUBLINGUAL_TABLET | SUBLINGUAL | 3 refills | Status: DC | PRN

## 2020-03-07 NOTE — Progress Notes (Signed)
Internal Medicine Clinic Attending  Case discussed with Dr. [name] at the time of the visit.  We reviewed the resident's history and exam and pertinent patient test results.  I agree with the assessment, diagnosis, and plan of care documented in the resident's note.  Very challenging situation, could benefit from diltiazem or imipramine, but difficult to take medications. Similar situation with her anxiety. Acute epigastric pain from dysmotility may respond to sublingual nitroglycerin taken prn, should not be taken more than TID. Will see if GI has any other suggestions for management.   Lenice Pressman, M.D., Ph.D.

## 2020-03-08 ENCOUNTER — Emergency Department (HOSPITAL_COMMUNITY)
Admission: EM | Admit: 2020-03-08 | Discharge: 2020-03-09 | Disposition: A | Discharge status: Home / Self Care | Payer: Medicare Other | Attending: Emergency Medicine | Admitting: Emergency Medicine

## 2020-03-08 ENCOUNTER — Encounter (HOSPITAL_COMMUNITY): Payer: Self-pay | Admitting: Emergency Medicine

## 2020-03-08 ENCOUNTER — Other Ambulatory Visit: Payer: Self-pay

## 2020-03-08 ENCOUNTER — Emergency Department (HOSPITAL_COMMUNITY): Payer: Medicare Other

## 2020-03-08 DIAGNOSIS — E162 Hypoglycemia, unspecified: Secondary | ICD-10-CM | POA: Diagnosis not present

## 2020-03-08 DIAGNOSIS — Z79899 Other long term (current) drug therapy: Secondary | ICD-10-CM | POA: Diagnosis not present

## 2020-03-08 DIAGNOSIS — I6523 Occlusion and stenosis of bilateral carotid arteries: Secondary | ICD-10-CM | POA: Diagnosis not present

## 2020-03-08 DIAGNOSIS — S199XXA Unspecified injury of neck, initial encounter: Secondary | ICD-10-CM | POA: Diagnosis not present

## 2020-03-08 DIAGNOSIS — W1839XA Other fall on same level, initial encounter: Secondary | ICD-10-CM | POA: Diagnosis not present

## 2020-03-08 DIAGNOSIS — R58 Hemorrhage, not elsewhere classified: Secondary | ICD-10-CM | POA: Diagnosis not present

## 2020-03-08 DIAGNOSIS — Y9389 Activity, other specified: Secondary | ICD-10-CM | POA: Diagnosis not present

## 2020-03-08 DIAGNOSIS — G9389 Other specified disorders of brain: Secondary | ICD-10-CM | POA: Diagnosis not present

## 2020-03-08 DIAGNOSIS — Y929 Unspecified place or not applicable: Secondary | ICD-10-CM | POA: Diagnosis not present

## 2020-03-08 DIAGNOSIS — Z87891 Personal history of nicotine dependence: Secondary | ICD-10-CM | POA: Insufficient documentation

## 2020-03-08 DIAGNOSIS — S0181XA Laceration without foreign body of other part of head, initial encounter: Secondary | ICD-10-CM | POA: Diagnosis not present

## 2020-03-08 DIAGNOSIS — R0902 Hypoxemia: Secondary | ICD-10-CM | POA: Diagnosis not present

## 2020-03-08 DIAGNOSIS — E161 Other hypoglycemia: Secondary | ICD-10-CM | POA: Diagnosis not present

## 2020-03-08 DIAGNOSIS — Y999 Unspecified external cause status: Secondary | ICD-10-CM | POA: Diagnosis not present

## 2020-03-08 DIAGNOSIS — S0990XA Unspecified injury of head, initial encounter: Secondary | ICD-10-CM | POA: Diagnosis not present

## 2020-03-08 DIAGNOSIS — W19XXXA Unspecified fall, initial encounter: Secondary | ICD-10-CM

## 2020-03-08 DIAGNOSIS — J984 Other disorders of lung: Secondary | ICD-10-CM | POA: Diagnosis not present

## 2020-03-08 DIAGNOSIS — I10 Essential (primary) hypertension: Secondary | ICD-10-CM | POA: Insufficient documentation

## 2020-03-08 DIAGNOSIS — R42 Dizziness and giddiness: Secondary | ICD-10-CM | POA: Diagnosis not present

## 2020-03-08 DIAGNOSIS — Z8719 Personal history of other diseases of the digestive system: Secondary | ICD-10-CM | POA: Diagnosis not present

## 2020-03-08 LAB — COMPREHENSIVE METABOLIC PANEL
ALT: 12 U/L (ref 0–44)
AST: 22 U/L (ref 15–41)
Albumin: 4.3 g/dL (ref 3.5–5.0)
Alkaline Phosphatase: 53 U/L (ref 38–126)
Anion gap: 10 (ref 5–15)
BUN: 11 mg/dL (ref 8–23)
CO2: 28 mmol/L (ref 22–32)
Calcium: 9.2 mg/dL (ref 8.9–10.3)
Chloride: 105 mmol/L (ref 98–111)
Creatinine, Ser: 0.72 mg/dL (ref 0.44–1.00)
GFR, Estimated: >60 mL/min (ref >60)
Glucose, Bld: 93 mg/dL (ref 70–99)
Potassium: 3.3 mmol/L — ABNORMAL LOW (ref 3.5–5.1)
Sodium: 143 mmol/L (ref 135–145)
Total Bilirubin: 0.7 mg/dL (ref 0.3–1.2)
Total Protein: 6.8 g/dL (ref 6.5–8.1)

## 2020-03-08 LAB — CBC
HCT: 37.6 % (ref 36.0–46.0)
Hemoglobin: 12.1 g/dL (ref 12.0–15.0)
MCH: 31.3 pg (ref 26.0–34.0)
MCHC: 32.2 g/dL (ref 30.0–36.0)
MCV: 97.4 fL (ref 80.0–100.0)
Platelets: 226 10*3/uL (ref 150–400)
RBC: 3.86 MIL/uL — ABNORMAL LOW (ref 3.87–5.11)
RDW: 13.5 % (ref 11.5–15.5)
WBC: 4.1 10*3/uL (ref 4.0–10.5)
nRBC: 0 % (ref 0.0–0.2)

## 2020-03-08 MED ORDER — LIDOCAINE HCL (PF) 1 % IJ SOLN
30.0000 mL | Freq: Once | INTRAMUSCULAR | Status: AC
  Administered 2020-03-08: 30 mL
  Filled 2020-03-08: qty 30

## 2020-03-08 MED ORDER — POTASSIUM CHLORIDE 20 MEQ/15ML (10%) PO SOLN
20.0000 meq | Freq: Once | ORAL | Status: AC
  Administered 2020-03-08: 20 meq via ORAL
  Filled 2020-03-08: qty 15

## 2020-03-08 NOTE — Discharge Instructions (Addendum)
Suture removal in 7 days Please return if any worsening symptoms Your potassium was slightly low and orally repleted. Please call your doctor on Monday for recheck of potassium

## 2020-03-08 NOTE — ED Triage Notes (Signed)
Patient arrives from home s/p fall. Patient had an esophageal dilation done 02/05/2020. Since then, patient has had difficulty eating, causing subsequent weakness. Patient states she bent down to pick something up, and got light headed when she stood up too fast causing her to fall. Patient noted to have 2 lacerations above her left eye. Bleeding controlled at this time.

## 2020-03-08 NOTE — ED Notes (Signed)
PA and student at bedside completing sutures

## 2020-03-08 NOTE — ED Provider Notes (Addendum)
[facility] DEPT Provider Note   CSN: [id number] Arrival date & time: 03/08/20  2113     History Chief Complaint  Patient presents with  . Fall    [name] [name] is a 86 y.o. female.  HPI    86 year old female history of paroxysmal atrial fibrillation, anemia, status post recent esophageal dilatation presents today after fall.  Patient states that she was bending over and then got lightheaded.  She fell to the ground and struck her head.  She did not lose consciousness.  She called her son who was next-door.  She is able to get to her feet with assistance.  She denies any other injury.  She states she has had her last tetanus shot within the past several years.  She does report that she has had decreased p.o. intake secondary to esophageal problems.  She is able to drink water but states she has difficulty with anything else.  She is not on any blood thinners.  She reports taking her medications as prescribed. Patient reports she is vaccinated against Covid.  She denies any cough, sore throat, fever, or exposure to Covid Past Medical History:  Diagnosis Date  . Anemia    iron def  . Anxiety   . Arrhythmia    paroxysmal SVT and symtomatic PAC's  . Diverticulosis    colon  . Elevated troponin   . GERD 04/02/2008   Qualifier: Diagnosis of  By: Jenny Reichmann MD, Hunt Oris   . History of colonic polyps    cecal  . Hypertension   . 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 Osteoporosis 07/21/2011   t score -2.7  . Palpitations 05/24/2017  . Stricture esophagus   . Vitamin D deficiency    2010    Patient Active Problem List   Diagnosis Date Noted  . Esophageal dysphagia 02/05/2020  . Gastritis and gastroduodenitis   . Presbyesophagus 01/17/2020  . Difficulty swallowing pills 08/17/2019  . Sore throat 08/13/2019  . Varicose veins of both legs with edema 06/04/2019  . Hyperlipidemia 06/04/2019  .  History of repair of rectocele 03/13/2019  . Allergic reaction caused by a drug 03/13/2019  . Rotator cuff arthropathy, right 04/08/2016  . Arthritis of knee 03/25/2016  . Presbycusis of both ears 01/15/2016  . Tinnitus of right ear 01/15/2016  . Skin cancer of arm 09/26/2014  . Palpitation 09/28/2012  . Rhinitis 12/25/2011  . History of TIA (transient ischemic attack) 07/19/2011  . ATRIAL PREMATURE BEATS 03/20/2009  . ANEMIA-IRON DEFICIENCY 04/02/2008  . Anxiety 04/02/2008  . Essential hypertension 04/02/2008  . OSTEOPOROSIS 04/02/2008    Past Surgical History:  Procedure Laterality Date  . A&P, enterocele repair     s/p 2003  . ABDOMINAL HYSTERECTOMY    . BIOPSY  02/05/2020   Procedure: BIOPSY;  Surgeon: Thornton Park, MD;  Location: [facility];  Service: Gastroenterology;;  . BREAST REDUCTION SURGERY     s/p 1996      PATIENT DENIES BREAST REDUCTION   . ESOPHAGEAL DILATION  02/05/2020   Procedure: ESOPHAGEAL DILATION;  Surgeon: Thornton Park, MD;  Location: [facility];  Service: Gastroenterology;;  . ESOPHAGOGASTRODUODENOSCOPY (EGD) WITH PROPOFOL N/A 02/05/2020   Procedure: ESOPHAGOGASTRODUODENOSCOPY (EGD) WITH PROPOFOL;  Surgeon: Thornton Park, MD;  Location: [facility];  Service: Gastroenterology;  Laterality: N/A;  . HEMORRHOID SURGERY    . LEFT HEART CATH AND CORONARY ANGIOGRAPHY N/A 05/25/2017   Procedure:  LEFT HEART CATH AND CORONARY ANGIOGRAPHY;  Surgeon: Jettie Booze, MD;  Location: [facility];  Service: Cardiovascular;  Laterality: N/A;     OB History    Gravida  2   Para  2   Term      Preterm      AB      Living  1     SAB      TAB      Ectopic      Multiple      Live Births              Family History  Problem Relation Age of Onset  . Stroke Father        @ 100  . Hypertension Father   . Hypertension Sister   . Cancer Sister 63       brain  . Breast cancer Other 67       breast  . Breast cancer  Daughter 84       spread to her brain  . Cancer Other 58       colon/MELANOMA  . Stroke Mother        medication induced  . Kidney disease Brother   . Cancer Brother     Social History   Tobacco Use  . Smoking status: Former Smoker    Quit date: 05/24/1968    Years since quitting: 51.8  . Smokeless tobacco: Never Used  . Tobacco comment: only smoked 7 years  Vaping Use  . Vaping Use: Never used  Substance Use Topics  . Alcohol use: No    Alcohol/week: 0.0 standard drinks  . Drug use: No    Home Medications Prior to Admission medications   Medication Sig Start Date End Date Taking? Authorizing Provider  aspirin EC 325 MG tablet Take 325 mg by mouth daily as needed (headache).    [provider]  busPIRone (BUSPAR) 5 MG tablet Take 1 tablet (5 mg total) by mouth 2 (two) times daily. 02/07/20   Freida Busman, MD  Cod Liver Oil CAPS Take 1 capsule by mouth daily.    [provider]  famotidine (PEPCID) 20 MG tablet Take 1 tablet (20 mg total) by mouth 2 (two) times daily. 03/04/20   Milus Banister, MD  flecainide (TAMBOCOR) 50 MG tablet Take 1 tablet (50 mg total) by mouth See admin instructions. Take 2 tablets (100 mg) by mouth every morning and 1 tablet (50 mg) at night 08/02/19   Baldwin Jamaica, PA-C  LORazepam (ATIVAN) 0.5 MG tablet Take 0.25 mg by mouth every 8 (eight) hours as needed for anxiety.    [provider]  nitroGLYCERIN (NITROSTAT) 0.3 MG SL tablet Place 1 tablet (0.3 mg total) under the tongue every 5 (five) minutes as needed (esophageal pain. Do not take any more than 3 doses in 24 hours.). 03/07/20 03/07/21  [name], MD  sucralfate (CARAFATE) 1 g tablet Take 1 tablet (1 g total) by mouth 4 (four) times daily. 02/14/20   Lacretia Leigh, MD    Allergies    Other, Pantoprazole, Zyrtec [cetirizine], and Ciprofloxacin  Review of Systems   Review of Systems  All other systems reviewed and are negative.   Physical Exam Updated  Vital Signs BP (!) 158/67 (BP Location: Left Arm)   Pulse 65   Temp 97.7 F (36.5 C) (Oral)   Resp 11   LMP 05/24/1970   SpO2 98%   Physical Exam Vitals  and nursing note reviewed.  Constitutional:      General: She is not in acute distress.    Appearance: Normal appearance.  HENT:     Head: Normocephalic.     Comments: Stellate laceration left forehead Linear laceration below stellate laceration 3 cm    Right Ear: External ear normal.     Left Ear: External ear normal.     Nose: Nose normal.     Mouth/Throat:     Mouth: Mucous membranes are moist.     Pharynx: Oropharynx is clear.  Eyes:     Pupils: Pupils are equal, round, and reactive to light.  Cardiovascular:     Rate and Rhythm: Normal rate.     Pulses: Normal pulses.  Pulmonary:     Effort: Pulmonary effort is normal.  Abdominal:     General: Abdomen is flat.     Palpations: Abdomen is soft.  Musculoskeletal:        General: Normal range of motion.     Cervical back: Normal range of motion.     Comments: Cervical spine, thoracic spine, lumbar spine palpated without any obvious tenderness or step-off  Skin:    General: Skin is warm.     Capillary Refill: Capillary refill takes less than 2 seconds.  Neurological:     General: No focal deficit present.     Mental Status: She is alert and oriented to person, place, and time.     Cranial Nerves: No cranial nerve deficit.     Sensory: No sensory deficit.     Motor: No weakness.     Coordination: Coordination normal.  Psychiatric:        Mood and Affect: Mood normal.     ED Results / Procedures / Treatments   Labs (all labs ordered are listed, but only abnormal results are displayed) Labs Reviewed  CBC  COMPREHENSIVE METABOLIC PANEL    EKG EKG Interpretation  Date/Time:  Saturday March 08 2020 21:32:03 EDT Ventricular Rate:  66 PR Interval:    QRS Duration: 102 QT Interval:  432 QTC Calculation: 453 R Axis:   67 Text Interpretation: Sinus rhythm  Prolonged PR interval Low voltage, precordial leads Confirmed by Pattricia Boss [phone]) on 03/08/2020 9:34:16 PM   Radiology CT Head Wo Contrast  Result Date: 03/08/2020 CLINICAL DATA:  Neck trauma, status post fall. Esophageal dilatation 02/05/2019 EXAM: CT HEAD WITHOUT CONTRAST CT CERVICAL SPINE WITHOUT CONTRAST TECHNIQUE: Multidetector CT imaging of the head and cervical spine was performed following the standard protocol without intravenous contrast. Multiplanar CT image reconstructions of the cervical spine were also generated. COMPARISON:  CT head 08/30/2002 report without imaging FINDINGS: CT HEAD FINDINGS Brain: Cerebral ventricle sizes are concordant with the degree of cerebral volume loss. Patchy and confluent areas of decreased attenuation are noted throughout the deep and periventricular white matter of the cerebral hemispheres bilaterally, compatible with chronic microvascular ischemic disease. No evidence of large-territorial acute infarction. No parenchymal hemorrhage. No mass lesion. No extra-axial collection. No mass effect or midline shift. No hydrocephalus. Basilar cisterns are patent. Vascular: No hyperdense vessel. Atherosclerotic calcifications are present within the cavernous internal carotid arteries and vertebral arteries. Skull: No acute fracture or focal lesion. Sinuses/Orbits: Mucosal thickening of the left sphenoid sinus with associated sinus wall hypertrophy and sclerosis consistent with chronic sinusitis. Otherwise remaining paranasal sinuses and mastoid air cells are clear. The orbits are unremarkable. Other: None. CT CERVICAL SPINE FINDINGS Alignment: Normal. Skull base and vertebrae: Multilevel moderate degenerative changes of  the spine with diffusely decreased bone density. No acute fracture. No aggressive appearing focal osseous lesion or focal pathologic process. Soft tissues and spinal canal: No prevertebral fluid or swelling. No visible canal hematoma. Disc levels:   Maintained. Upper chest: Biapical pleural/pulmonary scarring. Other: None. IMPRESSION: 1. No acute intracranial abnormality. 2. No acute displaced fracture or traumatic listhesis of the cervical spine. Electronically Signed   By: Iven Finn M.D.   On: 03/08/2020 22:17   CT Cervical Spine Wo Contrast  Result Date: 03/08/2020 CLINICAL DATA:  Neck trauma, status post fall. Esophageal dilatation 02/05/2019 EXAM: CT HEAD WITHOUT CONTRAST CT CERVICAL SPINE WITHOUT CONTRAST TECHNIQUE: Multidetector CT imaging of the head and cervical spine was performed following the standard protocol without intravenous contrast. Multiplanar CT image reconstructions of the cervical spine were also generated. COMPARISON:  CT head 08/30/2002 report without imaging FINDINGS: CT HEAD FINDINGS Brain: Cerebral ventricle sizes are concordant with the degree of cerebral volume loss. Patchy and confluent areas of decreased attenuation are noted throughout the deep and periventricular white matter of the cerebral hemispheres bilaterally, compatible with chronic microvascular ischemic disease. No evidence of large-territorial acute infarction. No parenchymal hemorrhage. No mass lesion. No extra-axial collection. No mass effect or midline shift. No hydrocephalus. Basilar cisterns are patent. Vascular: No hyperdense vessel. Atherosclerotic calcifications are present within the cavernous internal carotid arteries and vertebral arteries. Skull: No acute fracture or focal lesion. Sinuses/Orbits: Mucosal thickening of the left sphenoid sinus with associated sinus wall hypertrophy and sclerosis consistent with chronic sinusitis. Otherwise remaining paranasal sinuses and mastoid air cells are clear. The orbits are unremarkable. Other: None. CT CERVICAL SPINE FINDINGS Alignment: Normal. Skull base and vertebrae: Multilevel moderate degenerative changes of the spine with diffusely decreased bone density. No acute fracture. No aggressive appearing  focal osseous lesion or focal pathologic process. Soft tissues and spinal canal: No prevertebral fluid or swelling. No visible canal hematoma. Disc levels:  Maintained. Upper chest: Biapical pleural/pulmonary scarring. Other: None. IMPRESSION: 1. No acute intracranial abnormality. 2. No acute displaced fracture or traumatic listhesis of the cervical spine. Electronically Signed   By: Iven Finn M.D.   On: 03/08/2020 22:17    Procedures .Marland KitchenLaceration Repair  Date/Time: 03/08/2020 10:33 PM Performed by: Pattricia Boss, MD Authorized by: Pattricia Boss, MD   Consent:    Consent obtained:  Verbal   Consent given by:  Patient   Risks discussed:  Pain, infection and poor cosmetic result   Alternatives discussed:  No treatment Anesthesia (see MAR for exact dosages):    Anesthesia method:  Local infiltration   Local anesthetic:  Lidocaine 1% w/o epi Laceration details:    Location:  Face   Length (cm):  3 Repair type:    Repair type:  Simple Pre-procedure details:    Preparation:  Patient was prepped and draped in usual sterile fashion Exploration:    Wound exploration: wound explored through full range of motion     Contaminated: no   Treatment:    Area cleansed with:  Hibiclens and saline   Amount of cleaning:  Standard   Irrigation solution:  Sterile saline   Visualized foreign bodies/material removed: no   Skin repair:    Repair method:  Sutures   Suture size:  6-0   Number of sutures:  3 Approximation:    Approximation:  Close Post-procedure details:    Dressing:  Antibiotic ointment   Patient tolerance of procedure:  Tolerated well, no immediate complications .Marland KitchenLaceration Repair  Date/Time: 03/08/2020 10:34  PM Performed by: Pattricia Boss, MD Authorized by: Pattricia Boss, MD   Consent:    Consent obtained:  Verbal   Consent given by:  Patient   Risks discussed:  Infection, pain and retained foreign body   Alternatives discussed:  No treatment Anesthesia (see MAR  for exact dosages):    Anesthesia method:  Local infiltration   Local anesthetic:  Lidocaine 1% w/o epi Laceration details:    Location:  Face   Face location:  Forehead   Length (cm):  4 Repair type:    Repair type:  Simple Pre-procedure details:    Preparation:  Patient was prepped and draped in usual sterile fashion Exploration:    Wound exploration: wound explored through full range of motion     Contaminated: no   Treatment:    Area cleansed with:  Hibiclens   Amount of cleaning:  Standard   Visualized foreign bodies/material removed: no   Skin repair:    Repair method:  Sutures   Suture size:  6-0   Suture technique:  Simple interrupted   Number of sutures:  4 Approximation:    Approximation:  Close Post-procedure details:    Dressing:  Antibiotic ointment   Patient tolerance of procedure:  Tolerated well, no immediate complications   (including critical care time)  Medications Ordered in ED Medications - No data to display  ED Course  I have reviewed the triage vital signs and the nursing notes.  Pertinent labs & imaging results that were available during my care of the patient were reviewed by me and considered in my medical decision making (see chart for details).    MDM Rules/Calculators/A&P                          86 yo female fell today with some lightheadedness.  Labs here normal except mild hypokalemia.  Head ct without acute abnormality Lacerations repaired- see separate note. Plan d/c to home with return precautions.  Final Clinical Impression(s) / ED Diagnoses Final diagnoses:  Fall, initial encounter  Facial laceration, initial encounter    Rx / DC Orders ED Discharge Orders    None       Pattricia Boss, MD 03/08/20 2226    Pattricia Boss, MD 03/08/20 2236

## 2020-03-11 ENCOUNTER — Other Ambulatory Visit: Payer: Self-pay | Admitting: Internal Medicine

## 2020-03-11 DIAGNOSIS — K2289 Other specified disease of esophagus: Secondary | ICD-10-CM

## 2020-03-11 DIAGNOSIS — R1319 Other dysphagia: Secondary | ICD-10-CM

## 2020-03-11 MED ORDER — NITROGLYCERIN 0.3 MG SL SUBL: 0.3000 mg | SUBLINGUAL_TABLET | SUBLINGUAL | 3 refills | Status: DC | PRN

## 2020-03-14 ENCOUNTER — Ambulatory Visit (INDEPENDENT_AMBULATORY_CARE_PROVIDER_SITE_OTHER): Payer: Medicare Other | Admitting: Internal Medicine

## 2020-03-14 ENCOUNTER — Other Ambulatory Visit: Payer: Self-pay

## 2020-03-14 ENCOUNTER — Encounter: Payer: Self-pay | Admitting: Internal Medicine

## 2020-03-14 DIAGNOSIS — Z4802 Encounter for removal of sutures: Secondary | ICD-10-CM

## 2020-03-14 DIAGNOSIS — W19XXXD Unspecified fall, subsequent encounter: Secondary | ICD-10-CM

## 2020-03-14 NOTE — Progress Notes (Signed)
CC: Fall follow-up  HPI:  [name] is a 86 y.o. female with a past medical history stated below and presents today for fall. Please see problem based assessment and plan for additional details.  Past Medical History:  Diagnosis Date  . Anemia    iron def  . Anxiety   . Arrhythmia    paroxysmal SVT and symtomatic PAC's  . Diverticulosis    colon  . Elevated troponin   . GERD 04/02/2008   Qualifier: Diagnosis of  By: Jenny Reichmann MD, Hunt Oris   . History of colonic polyps    cecal  . Hypertension   . 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 Osteoporosis 07/21/2011   t score -2.7  . Palpitations 05/24/2017  . Stricture esophagus   . Vitamin D deficiency    2010    Current Outpatient Medications on File Prior to Visit  Medication Sig Dispense Refill  . aspirin EC 325 MG tablet Take 325 mg by mouth daily as needed (headache).    . busPIRone (BUSPAR) 5 MG tablet Take 1 tablet (5 mg total) by mouth 2 (two) times daily. 180 tablet 0  . Cod Liver Oil CAPS Take 1 capsule by mouth daily.    . famotidine (PEPCID) 20 MG tablet Take 1 tablet (20 mg total) by mouth 2 (two) times daily. 90 tablet 1  . flecainide (TAMBOCOR) 50 MG tablet Take 1 tablet (50 mg total) by mouth See admin instructions. Take 2 tablets (100 mg) by mouth every morning and 1 tablet (50 mg) at night 270 tablet 1  . LORazepam (ATIVAN) 0.5 MG tablet Take 0.25 mg by mouth every 8 (eight) hours as needed for anxiety.    . nitroGLYCERIN (NITROSTAT) 0.3 MG SL tablet Place 1 tablet (0.3 mg total) under the tongue every 5 (five) minutes as needed (esophageal pain. Do not take any more than 3 doses in 24 hours.). 100 tablet 3  . sucralfate (CARAFATE) 1 g tablet Take 1 tablet (1 g total) by mouth 4 (four) times daily. 30 tablet 0   No current facility-administered medications on file prior to visit.    Family History  Problem Relation Age of Onset  . Stroke Father         @ 67  . Hypertension Father   . Hypertension Sister   . Cancer Sister 19       brain  . Breast cancer Other 37       breast  . Breast cancer Daughter 26       spread to her brain  . Cancer Other 58       colon/MELANOMA  . Stroke Mother        medication induced  . Kidney disease Brother   . Cancer Brother     Social History   Socioeconomic History  . Marital status: Widowed    Spouse name: Not on file  . Number of children: 2  . Years of education: Not on file  . Highest education level: Not on file  Occupational History  . Occupation: Retired  Tobacco Use  . Smoking status: Former Smoker    Quit date: 05/24/1968    Years since quitting: 51.8  . Smokeless tobacco: Never Used  . Tobacco comment: only smoked 7 years  Vaping Use  . Vaping Use: Never used  Substance and Sexual Activity  . Alcohol use: No    Alcohol/week: 0.0  standard drinks  . Drug use: No  . Sexual activity: Never    Comment: 1st intercourse 82 yo-1 partner  Other Topics Concern  . Not on file  Social History Narrative  . Not on file   Social Determinants of Health   Financial Resource Strain:   . Difficulty of Paying Living Expenses: Not on file  Food Insecurity:   . Worried About Charity fundraiser in the Last Year: Not on file  . Ran Out of Food in the Last Year: Not on file  Transportation Needs:   . Lack of Transportation (Medical): Not on file  . Lack of Transportation (Non-Medical): Not on file  Physical Activity:   . Days of Exercise per Week: Not on file  . Minutes of Exercise per Session: Not on file  Stress:   . Feeling of Stress : Not on file  Social Connections:   . Frequency of Communication with Friends and Family: Not on file  . Frequency of Social Gatherings with Friends and Family: Not on file  . Attends Religious Services: Not on file  . Active Member of Clubs or Organizations: Not on file  . Attends Archivist Meetings: Not on file  . Marital Status: Not on  file  Intimate Partner Violence:   . Fear of Current or Ex-Partner: Not on file  . Emotionally Abused: Not on file  . Physically Abused: Not on file  . Sexually Abused: Not on file    Review of Systems: ROS negative except for what is noted on the assessment and plan.  Vitals:   03/14/20 1050  BP: 131/68  Pulse: 64  Temp: 97.9 F (36.6 C)  TempSrc: Oral  SpO2: 100%  Weight: 127 lb (57.6 kg)  Height: 5' 5.5" (1.664 m)     Physical Exam: Physical Exam Constitutional:      Appearance: Normal appearance.  HENT:     Head: Normocephalic and atraumatic.  Eyes:     Extraocular Movements: Extraocular movements intact.  Cardiovascular:     Rate and Rhythm: Normal rate.     Pulses: Normal pulses.     Heart sounds: Normal heart sounds.  Pulmonary:     Effort: Pulmonary effort is normal.     Breath sounds: Normal breath sounds.  Musculoskeletal:        General: Normal range of motion.     Cervical back: Normal range of motion.     Right lower leg: No edema.     Left lower leg: No edema.  Skin:    General: Skin is warm and dry.     Findings: Laceration (2x lacerations on the left forehead that are healing well without erythema, rash, or edema,) present.  Neurological:     Mental Status: She is alert and oriented to person, place, and time. Mental status is at baseline.  Psychiatric:        Mood and Affect: Mood normal.      Assessment & Plan:   See Encounters Tab for problem based charting.  Patient discussed with Dr. Lars Mage, D.O. [facility], PGY-2 Pager: [phone], Phone: [phone] Date 03/17/2020 Time 6:30 AM

## 2020-03-17 ENCOUNTER — Encounter: Payer: Self-pay | Admitting: Internal Medicine

## 2020-03-17 DIAGNOSIS — W19XXXA Unspecified fall, initial encounter: Secondary | ICD-10-CM | POA: Insufficient documentation

## 2020-03-17 NOTE — Patient Instructions (Signed)
Thank you, [name] for allowing [place] to provide your care today. Today we discussed suture removal.    I have ordered the following labs for you:  Lab Orders  No laboratory test(s) ordered today     Tests ordered today:  none  Referrals ordered today:   Referral Orders  No referral(s) requested today     I have ordered the following medication/changed the following medications:   Stop the following medications: There are no discontinued medications.   Start the following medications: No orders of the defined types were placed in this encounter.    Follow up: as need. Please follow up with out clinc after your GI appointment.     Remember: as needed.  Should you have any questions or concerns please call the internal medicine clinic at [phone].     [name] [name], D.O. [place]

## 2020-03-17 NOTE — Assessment & Plan Note (Addendum)
Patient presents for hospital follow-up after recently having a fall.  Patient fell and hit her head and as result had 2 lacerations requiring sutures.  All work-up in the ED was negative for intracranial hemorrhage.  Patient believes that she became dizzy secondary to taking Pepcid.  Is the medication the patient has been on for a long time.  Her dizziness is likely secondary to decreased oral intake in the setting of presbyesophagus.  I counseled her on the importance of eating multiple small meals throughout the day to decrease her symptoms.  Patient denies any further dizziness at this time.  I encouraged her to drink Ensure up to 3 times daily as needed for meal replacement.  On exam, patient's lacerations look clean and dry and healing well.  Removed the sutures today and discussed importance of not soaking the area to avoid dehiscence.  I counseled patient regarding covering the wounds as needed to avoid damage or injury.  Plan: -sutures removed today.

## 2020-03-17 NOTE — Progress Notes (Signed)
Internal Medicine Clinic Attending  Case discussed with Dr. Coe  At the time of the visit.  We reviewed the resident's history and exam and pertinent patient test results.  I agree with the assessment, diagnosis, and plan of care documented in the resident's note.  

## 2020-03-27 DIAGNOSIS — H2513 Age-related nuclear cataract, bilateral: Secondary | ICD-10-CM | POA: Diagnosis not present

## 2020-03-27 DIAGNOSIS — H0102A Squamous blepharitis right eye, upper and lower eyelids: Secondary | ICD-10-CM | POA: Diagnosis not present

## 2020-03-27 DIAGNOSIS — H5703 Miosis: Secondary | ICD-10-CM | POA: Diagnosis not present

## 2020-03-27 DIAGNOSIS — H0102B Squamous blepharitis left eye, upper and lower eyelids: Secondary | ICD-10-CM | POA: Diagnosis not present

## 2020-03-27 DIAGNOSIS — H04123 Dry eye syndrome of bilateral lacrimal glands: Secondary | ICD-10-CM | POA: Diagnosis not present

## 2020-03-28 ENCOUNTER — Ambulatory Visit (INDEPENDENT_AMBULATORY_CARE_PROVIDER_SITE_OTHER): Payer: Medicare Other | Admitting: Internal Medicine

## 2020-03-28 ENCOUNTER — Other Ambulatory Visit: Payer: Self-pay | Admitting: Internal Medicine

## 2020-03-28 ENCOUNTER — Telehealth: Payer: Self-pay

## 2020-03-28 ENCOUNTER — Encounter: Payer: Self-pay | Admitting: Internal Medicine

## 2020-03-28 VITALS — BP 166/83 | HR 70 | Temp 98.3°F | Ht 65.0 in | Wt 125.7 lb

## 2020-03-28 DIAGNOSIS — R1319 Other dysphagia: Secondary | ICD-10-CM

## 2020-03-28 DIAGNOSIS — Z23 Encounter for immunization: Secondary | ICD-10-CM | POA: Diagnosis not present

## 2020-03-28 MED ORDER — SUCRALFATE 1 GM/10ML PO SUSP: 1.0000 g | Freq: Four times a day (QID) | ORAL | 1 refills | Status: DC

## 2020-03-28 NOTE — Telephone Encounter (Signed)
OK for clinic evaluation per Dr. Heber [place].  Appt made for today at 3:45/red team/Dr. Sherry Ruffing. Pt notified. SChaplin, RN,BSN

## 2020-03-28 NOTE — Progress Notes (Signed)
   CC: Abdominal pain, anxiety  HPI:  [name] [name] is a 86 y.o. with the history listed below presenting for her abdominal pain and anxiety.   Past Medical History:  Diagnosis Date  . Anemia    iron def  . Anxiety   . Arrhythmia    paroxysmal SVT and symtomatic PAC's  . Diverticulosis    colon  . Elevated troponin   . GERD 04/02/2008   Qualifier: Diagnosis of  By: Jenny Reichmann MD, Hunt Oris   . History of colonic polyps    cecal  . Hypertension   . 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 Osteoporosis 07/21/2011   t score -2.7  . Palpitations 05/24/2017  . Stricture esophagus   . Vitamin D deficiency    2010   Review of Systems:   Constitutional: Negative for chills and fever.  Respiratory: Negative for shortness of breath.   Cardiovascular: Negative for chest pain and leg swelling.  Gastrointestinal: Positive for abdominal pain, decreased appetite. Negative for nausea and vomiting.  Neurological: Negative for dizziness and headaches.    Physical Exam:  Vitals:   03/28/20 1543  BP: (!) 166/77  Pulse: 73  Temp: 98.3 F (36.8 C)  TempSrc: Oral  SpO2: 98%  Weight: 125 lb 11.2 oz (57 kg)  Height: 5\' 5"  (1.651 m)   Physical Exam Constitutional:      Appearance: Normal appearance.     Comments: Frail appearing  HENT:     Head: Normocephalic and atraumatic.  Eyes:     Extraocular Movements: Extraocular movements intact.     Conjunctiva/sclera: Conjunctivae normal.     Pupils: Pupils are equal, round, and reactive to light.  Cardiovascular:     Rate and Rhythm: Normal rate and regular rhythm.     Pulses: Normal pulses.     Heart sounds: Normal heart sounds.  Pulmonary:     Effort: Pulmonary effort is normal.     Breath sounds: Normal breath sounds.  Abdominal:     General: Abdomen is flat. Bowel sounds are normal.     Palpations: Abdomen is soft.     Tenderness: There is abdominal tenderness (Mild TTP over  epigastric area). There is no guarding or rebound.  Musculoskeletal:        General: No swelling. Normal range of motion.     Cervical back: Normal range of motion and neck supple.  Skin:    General: Skin is warm and dry.     Capillary Refill: Capillary refill takes less than 2 seconds.  Neurological:     General: No focal deficit present.     Mental Status: She is alert and oriented to person, place, and time.  Psychiatric:        Mood and Affect: Mood normal.        Behavior: Behavior normal.      Assessment & Plan:   See Encounters Tab for problem based charting.  Patient discussed with Dr. Heber W[place]ena

## 2020-03-28 NOTE — Patient Instructions (Addendum)
[name] [name],  It was a pleasure to see you today. Thank you for coming in.   Today we discussed your abdominal pain. I am sorry that you are not feeling well. Please continue using the famotadine. You can start using the liquid carafate to see if this helps. You can also try the nitroglycerin when you have symptoms to see if this helps. Please follow up with Dr. Ardis Hughs.   You can start taking Buspar as prescribed to help with anxiety.   You received the flu vaccine today.   Please return to clinic in 1 month or sooner if needed.   Thank you again for coming in.   [name].

## 2020-03-28 NOTE — Telephone Encounter (Signed)
Received TC from patient who states "my esophagus is pulsating in my throat again.  It's been doing this since yesterday, I've been in bed all day today and I can't take this any longer.  Can I have an appt to come in, because I am not going to the ED". Patient denies chest pain, but states her "heart is fluttering intermittently".  Pt states she is very anxious and sound anxious to nurse.   Per office visit on 03/06/20, plan was for patient to try nitroglycerin and continue pepcid and carafate.  RN asked patient if she has followed this POC, she states she is not taking the nitroglycerin, ever, and she can't take the carafate, it's too big to swallow.  She has taken 1 dose of pepcid this morning doses of peptobismol.    Pt continues to c/o "heart fluttering so bad she can't get comfortable".  RN advised patient if her heart is fluttering this much and she is this uncomfortable, she should present to ED for evaluation.  She refuses and wants to come to office for a visit. Will route to attendings. SChaplin, RN,BSN

## 2020-03-29 ENCOUNTER — Other Ambulatory Visit: Payer: Self-pay | Admitting: Internal Medicine

## 2020-03-31 DIAGNOSIS — Z23 Encounter for immunization: Secondary | ICD-10-CM | POA: Insufficient documentation

## 2020-03-31 NOTE — Assessment & Plan Note (Signed)
Patient reports that she continues to have epigastric pain, was only able to tolerate 1 Ensure yesterday.  Abdominal pain is worse with eating, has occasional associated palpitations however has not had palpitations recently.  She reports that she has difficulty sleeping.  She reports significant anxiety when this occurs.  Has not been using Carafate or the nitroglycerin.  She states that she is worried about using the nitroglycerin, states she wanted to see Dr. Ardis Hughs prior to starting this. On exam she has a regular rate and rhythm, some mild tenderness over the epigastric area. Patient has a history of epigastric pain secondary to presbyesophagus.  She reports that she knows that there is not much to do from our standpoint.  We discussed that she can use the carafate liquid instead of tablets due to difficulty swallowing.  Also discussed the importance of eating small meals multiple times throughout the day.  Her previous visit with her PCP Dr. [name] there was a discussion for possible referral to general surgery/GI for consideration of gastric tube placement, brought this up and patient declined any referral for now.  He also may have a component of anxiety that could worsen her symptoms she states she has not been taking the BuSpar, she reports she only takes the Xanax as needed.  Encouraged her to take BuSpar daily.   -Continue Pepcid daily -Switch to Carafate to liquid formulation -Advised to trial nitroglycerin -Follow-up with GI

## 2020-03-31 NOTE — Assessment & Plan Note (Signed)
Patient received flu vaccine.

## 2020-03-31 NOTE — Telephone Encounter (Signed)
Please clarify how pt is supposed to take flecainide. Is it 1 tablet by mouth daily or 2 tablets in the morning and 1 tablet in the evening? Please address

## 2020-04-01 ENCOUNTER — Encounter: Payer: Self-pay | Admitting: Gastroenterology

## 2020-04-01 ENCOUNTER — Ambulatory Visit (INDEPENDENT_AMBULATORY_CARE_PROVIDER_SITE_OTHER): Payer: Medicare Other | Admitting: Gastroenterology

## 2020-04-01 DIAGNOSIS — R1310 Dysphagia, unspecified: Secondary | ICD-10-CM

## 2020-04-01 DIAGNOSIS — R0789 Other chest pain: Secondary | ICD-10-CM

## 2020-04-01 MED ORDER — HYOSCYAMINE SULFATE SL 0.125 MG SL SUBL: SUBLINGUAL_TABLET | SUBLINGUAL | 3 refills | Status: DC

## 2020-04-01 NOTE — Progress Notes (Signed)
Internal Medicine Clinic Attending ° °Case discussed with Dr. Krienke  At the time of the visit.  We reviewed the resident’s history and exam and pertinent patient test results.  I agree with the assessment, diagnosis, and plan of care documented in the resident’s note.  °

## 2020-04-01 NOTE — Progress Notes (Signed)
HPI: This is a very pleasant 86 year old woman who is here with her son or grandson today.  2003 colonoscopy: diverticulosis.    05/2018 UGI: Ba tablet passed easily.   01/2019 CTAP w contrast: colonic diverticulosis 12/25/2019: UGI series: Moderate presbyesophagus.  Small prox duodenal tic.  No HH or GERD.    Seen in GI office 11/2019 for ongoing dysphagia and epigastric pain.   12/28/2019 MBSS: limited due to pt reticence to participate.   "functional oropharyngeal swallow ability with no aspiration or deep laryngeal penetration of any consistency tested. Trace laryngeal penetration on swallow of thin via straw is [facility] for age. In addition, no pharyngeal residuals post-swallow. Minimal difficulty orally transiting pudding bolus with appearance of near gagging but with encouragement pt able to propel posterior and swallow. No oral issues with cracker, nectar or thin. SLP oral holding/near gagging with puree was anxiety driven."   GI added Omeprazole 40 mg po bid (pt "allergic" to Protonix).  Pt continues to have abd pain worse PP.  Consequently eating very little.   Came to ED 0545 today w abd pain.  "sharp, epigastric". LFTs, BMET unnremarkable, no renal insufficiency.  WBCs 3.9. Hgb 13.1.   Says wt dropped from 143 # to 125 # over several months.   Denies nausea, vomiting.  Occasional constipation which she manages effectively with prune juice.  Last bowel movement was over the weekend, brown stool.  CT scan abdomen pelvis with oral contrast September 2021 showed "moderately distended gallbladder without evidence of calcified gallstones or cholecystitis" sigmoid diverticulosis without diverticulitis,  Blood work October 2021 shows normal complete metabolic profile except for potassium 3.3, normal CBC.  EGD September 2021 Dr. Tarri Glenn while in hospital showed a normal esophagus that was dilated and then biopsied, also showed mild gastropathy.  The biopsies from these showed no clear sign of  etiology.  She tells me today that she has lost 35 pounds in the past several months however according to our scale she has only lost 15 pounds since July.  She is continued to be bothered by significant swallowing, eating related discomforts.  She feels food hangs or catches in her lower esophagus.  She feels significant spasms in her lower esophagus with just about every meal.  She causes a tightness and a tingling.    She is not having nausea or vomiting.  She did not try the nitroglycerin which was given to her by her primary care physician because she has had cardiac problems in the past and she declines to try it.   ROS: complete GI ROS as described in HPI, all other review negative.  Constitutional:  No unintentional weight loss   Past Medical History:  Diagnosis Date  . Anemia    iron def  . Anxiety   . Arrhythmia    paroxysmal SVT and symtomatic PAC's  . Diverticulosis    colon  . Elevated troponin   . GERD 04/02/2008   Qualifier: Diagnosis of  By: Jenny Reichmann MD, Hunt Oris   . History of colonic polyps    cecal  . Hypertension   . 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 Osteoporosis 07/21/2011   t score -2.7  . Palpitations 05/24/2017  . Stricture esophagus   . Vitamin D deficiency    2010    Past Surgical History:  Procedure Laterality Date  . A&P, enterocele repair     s/p 2003  . ABDOMINAL  HYSTERECTOMY    . BIOPSY  02/05/2020   Procedure: BIOPSY;  Surgeon: Thornton Park, MD;  Location: [place];  Service: Gastroenterology;;  . BREAST REDUCTION SURGERY     s/p 1996      PATIENT DENIES BREAST REDUCTION   . ESOPHAGEAL DILATION  02/05/2020   Procedure: ESOPHAGEAL DILATION;  Surgeon: Thornton Park, MD;  Location: [place];  Service: Gastroenterology;;  . ESOPHAGOGASTRODUODENOSCOPY (EGD) WITH PROPOFOL N/A 02/05/2020   Procedure: ESOPHAGOGASTRODUODENOSCOPY (EGD) WITH PROPOFOL;  Surgeon: Thornton Park, MD;  Location: [place];  Service: Gastroenterology;  Laterality: N/A;  . HEMORRHOID SURGERY    . LEFT HEART CATH AND CORONARY ANGIOGRAPHY N/A 05/25/2017   Procedure: LEFT HEART CATH AND CORONARY ANGIOGRAPHY;  Surgeon: Jettie Booze, MD;  Location: [place] [facility];  Service: Cardiovascular;  Laterality: N/A;    Current Outpatient Medications  Medication Sig Dispense Refill  . AMBULATORY NON FORMULARY MEDICATION Peppermint altoids 1-2 tabs as needed    . aspirin EC 325 MG tablet Take 325 mg by mouth daily as needed (headache).    Marland Kitchen Cod Liver Oil CAPS Take 1 capsule by mouth daily.    . famotidine (PEPCID) 20 MG tablet Take 1 tablet (20 mg total) by mouth 2 (two) times daily. 90 tablet 1  . flecainide (TAMBOCOR) 50 MG tablet TAKE 2 TABLETS EVERY MORNING AND 1 TABLET AT NIGHT 270 tablet 3  . busPIRone (BUSPAR) 5 MG tablet Take 1 tablet (5 mg total) by mouth 2 (two) times daily. (Patient not taking: Reported on 04/01/2020) 180 tablet 0  . nitroGLYCERIN (NITROSTAT) 0.3 MG SL tablet Place 1 tablet (0.3 mg total) under the tongue every 5 (five) minutes as needed (esophageal pain. Do not take any more than 3 doses in 24 hours.). (Patient not taking: Reported on 04/01/2020) 100 tablet 3  . sucralfate (CARAFATE) 1 GM/10ML suspension Take 10 mLs (1 g total) by mouth 4 (four) times daily. (Patient not taking: Reported on 04/01/2020) 420 mL 1   No current facility-administered medications for this visit.    Allergies as of 04/01/2020 - Review Complete 04/01/2020  Allergen Reaction Noted  . Other Anaphylaxis 07/22/2017  . Pantoprazole Other (See Comments) 07/22/2017  . Zyrtec [cetirizine] Palpitations 04/11/2014  . Ciprofloxacin  03/09/2019    Family History  Problem Relation Age of Onset  . Stroke Father        @ 36  . Hypertension Father   . Hypertension Sister   . Cancer Sister 38       brain  . Breast cancer Other 63       breast  . Breast cancer Daughter 51        spread to her brain  . Cancer Other 58       colon/MELANOMA  . Stroke Mother        medication induced  . Kidney disease Brother   . Cancer Brother     Social History   Socioeconomic History  . Marital status: Widowed    Spouse name: Not on file  . Number of children: 2  . Years of education: Not on file  . Highest education level: Not on file  Occupational History  . Occupation: Retired  Tobacco Use  . Smoking status: Former Smoker    Quit date: 05/24/1968    Years since quitting: 51.8  . Smokeless tobacco: Never Used  . Tobacco comment: only smoked 7 years  Vaping Use  . Vaping Use: Never used  Substance and  Sexual Activity  . Alcohol use: No    Alcohol/week: 0.0 standard drinks  . Drug use: No  . Sexual activity: Never    Comment: 1st intercourse 11 yo-1 partner  Other Topics Concern  . Not on file  Social History Narrative  . Not on file   Social Determinants of Health   Financial Resource Strain:   . Difficulty of Paying Living Expenses: Not on file  Food Insecurity:   . Worried About Charity fundraiser in the Last Year: Not on file  . Ran Out of Food in the Last Year: Not on file  Transportation Needs:   . Lack of Transportation (Medical): Not on file  . Lack of Transportation (Non-Medical): Not on file  Physical Activity:   . Days of Exercise per Week: Not on file  . Minutes of Exercise per Session: Not on file  Stress:   . Feeling of Stress : Not on file  Social Connections:   . Frequency of Communication with Friends and Family: Not on file  . Frequency of Social Gatherings with Friends and Family: Not on file  . Attends Religious Services: Not on file  . Active Member of Clubs or Organizations: Not on file  . Attends Archivist Meetings: Not on file  . Marital Status: Not on file  Intimate Partner Violence:   . Fear of Current or Ex-Partner: Not on file  . Emotionally Abused: Not on file  . Physically Abused: Not on file  . Sexually  Abused: Not on file     Physical Exam: BP 140/80 (BP Location: Left Arm, Patient Position: Sitting, Cuff Size: Normal)   Pulse 76   Ht 5' 4.5" (1.638 m)   Wt 124 lb (56.2 kg)   LMP 05/24/1970   BMI 20.96 kg/m  Constitutional: generally well-appearing Psychiatric: alert and oriented x3 Abdomen: soft, nontender, nondistended, no obvious ascites, no peritoneal signs, normal bowel sounds No peripheral edema noted in lower extremities  Assessment and plan: 86 y.o. female with substernal, epigastric pain with eating, swallowing.  Dysphagia.  I think she has either achalasia or diffuse esophageal spasms with eating.  I recommended further evaluation with high-resolution esophageal manometry and she is going to try taking Levsin sublingual 0.125 pills 1 to 2 pills daily as needed for these pains.  Please see the "Patient Instructions" section for addition details about the plan.  Owens Loffler, MD [facility] 04/01/2020, 10:43 AM   Total time on date of encounter was 35 minutes (this included time spent preparing to see the patient reviewing records; obtaining and/or reviewing separately obtained history; performing a medically appropriate exam and/or evaluation; counseling and educating the patient and family if present; ordering medications, tests or procedures if applicable; and documenting clinical information in the health record).

## 2020-04-01 NOTE — Patient Instructions (Addendum)
If you are age 84 or older, your body mass index should be between 23-30. Your Body mass index is 20.96 kg/m. If this is out of the aforementioned range listed, please consider follow up with your Primary Care Provider.  If you are age 36 or younger, your body mass index should be between 19-25. Your Body mass index is 20.96 kg/m. If this is out of the aformentioned range listed, please consider follow up with your Primary Care Provider.   We have sent the following medications to your pharmacy for you to pick up at your convenience:  START: Levsin (hyoscyamine) 0.125mg  take 1 to 2 tablets sublingual (under the tongue) as needed for spasms.   Due to recent COVID-19 restrictions implemented by our local and state authorities and in an effort to keep both patients and staff as safe as possible, our hospital system now requires COVID-19 testing prior to any scheduled hospital procedure. Please go to [address], [place], [place] [address] on 05-02-20 at  10:30am. This is a drive up testing site, you will not need to exit your vehicle.  You will not be billed at the time of testing but may receive a bill later depending on your insurance. The approximate cost of the test is $100. You must agree to quarantine from the time of your testing until the procedure date on 05-07-20 . This should include staying at home with ONLY the people you live with. Avoid take-out, grocery store shopping or leaving the house for any non-emergent reason. Failure to have your COVID-19 test done on the date and time you have been scheduled will result in cancellation of procedure. Please call our office at [phone] if you have any questions.    You have been scheduled for an esophageal manometry test at [facility] on 05-07-20 at 8:30am. Please arrive 30 minutes prior to your procedure for registration. You will need to go to outpatient registration (1st floor of the hospital) first. Make certain to bring your  insurance cards as well as a complete list of medications.  Please remember the following:  1) Do not take any muscle relaxants, xanax (alprazolam) or ativan for 1 day prior to your test as well as the day of the test.  2) Nothing to eat or drink after 12:00 midnight on the night before your test.  3) Hold all diabetic medications/insulin the morning of the test. You may eat and take your medications after the test.  It will take at least 2 weeks to receive the results of this test from your physician.  ------------------------------------------ ABOUT ESOPHAGEAL MANOMETRY  Esophageal manometry (muh-NOM-uh-tree) is a test that gauges how well your esophagus works. Your esophagus is the long, muscular tube that connects your throat to your stomach. Esophageal manometry measures the rhythmic muscle contractions (peristalsis) that occur in your esophagus when you swallow. Esophageal manometry also measures the coordination and force exerted by the muscles of your esophagus.  During esophageal manometry, a thin, flexible tube (catheter) that contains sensors is passed through your nose, down your esophagus and into your stomach. Esophageal manometry can be helpful in diagnosing some mostly uncommon disorders that affect your esophagus.  Why it's done Esophageal manometry is used to evaluate the movement (motility) of food through the esophagus and into the stomach. The test measures how well the circular bands of muscle (sphincters) at the top and bottom of your esophagus open and close, as well as the pressure, strength and pattern of the wave  of esophageal muscle contractions that moves food along.  What you can expect Esophageal manometry is an outpatient procedure done without sedation. Most people tolerate it well. You may be asked to change into a hospital gown before the test starts.  During esophageal manometry  . While you are sitting up, a member of your health care team sprays your throat  with a numbing medication or puts numbing gel in your nose or both.  . A catheter is guided through your nose into your esophagus. The catheter may be sheathed in a water-filled sleeve. It doesn't interfere with your breathing. However, your eyes may water, and you may gag. You may have a slight nosebleed from irritation.  . After the catheter is in place, you may be asked to lie on your back on an exam table, or you may be asked to remain seated.  . You then swallow small sips of water. As you do, a computer connected to the catheter records the pressure, strength and pattern of your esophageal muscle contractions.  . During the test, you'll be asked to breathe slowly and smoothly, remain as still as possible, and swallow only when you're asked to do so.  . A member of your health care team may move the catheter down into your stomach while the catheter continues its measurements.  . The catheter then is slowly withdrawn. The test usually lasts 20 to 30 minutes.  After esophageal manometry  When your esophageal manometry is complete, you may return to your normal activities  This test typically takes 30-45 minutes to complete. _________________________________________________________  Thank you for entrusting me with your care and choosing [facility].  Dr Ardis Hughs

## 2020-04-04 ENCOUNTER — Ambulatory Visit: Payer: Medicare Other | Admitting: *Deleted

## 2020-04-04 ENCOUNTER — Ambulatory Visit: Payer: Medicare Other

## 2020-04-04 DIAGNOSIS — R1319 Other dysphagia: Secondary | ICD-10-CM

## 2020-04-04 DIAGNOSIS — E785 Hyperlipidemia, unspecified: Secondary | ICD-10-CM

## 2020-04-04 DIAGNOSIS — I10 Essential (primary) hypertension: Secondary | ICD-10-CM

## 2020-04-04 NOTE — Patient Instructions (Signed)
Visit Information  Goals Addressed              This Visit's Progress   .  "I told the social worker  I need help in the home" (pt-stated)         Current Barriers:  . Chronic Disease Management support, education, and care coordination needs related to HTN ,Fall Risk and Osteoporosis  Case Manager Clinical Goal(s):  Marland Kitchen Over the next 60 days, patient will work with BSW to address needs related to Financial constraints related to need for help in the home. . ADL IADL limitations in patient with HTN, Fall Risk and Osteoporosis   Interventions:  . Collaborated with BSW to initiate plan of care to address needs related to Financial constraints related to need for help in the home . ADL IADL limitations in patient with HTN. Fall Risk and Osteoporosis   Patient Self Care Activities:  . Self administers medications as prescribed . Attends all scheduled provider appointments . Calls pharmacy for medication refills . Calls provider office for new concerns or questions  Initial goal documentation        [name] [name] was given information about Care Management services today including:  1. Care Management services include personalized support from designated clinical staff supervised by her physician, including individualized plan of care and coordination with other care providers 2. 24/7 contact phone numbers for assistance for urgent and routine care needs. 3. The patient may stop CCM services at any time (effective at the end of the month) by phone call to the office staff.  Patient agreed to services and verbal consent obtained.   The patient verbalized understanding of instructions, educational materials, and care plan provided today and declined offer to receive copy of patient instructions, educational materials, and care plan.   The care management team will reach out to the patient again over the next 60 days.   Lazaro Arms RN, BSN, [facility] Care Management Coordinator [place] Phone: [phone] Fax: [phone]

## 2020-04-04 NOTE — Chronic Care Management (AMB) (Signed)
Care Management   Initial Visit Note  04/04/2020 Name: [name] [name] MRN: [id number] DOB: 02-Aug-1929     [name] [name] is a 86 y.o. year old female who sees [name] Payment, MD for primary care. The care management team was consulted for assistance with care management and care coordination needs related to Disease Management Educational Needs for HTN High Fall Risk and Osteoporosis     Review of patient status, including review of consultants reports, relevant laboratory and other test results, and collaboration with appropriate care team members and the patient's provider was performed as part of comprehensive patient evaluation and provision of care management services.    SDOH (Social Determinants of Health) assessments performed: No See Care Plan activities for detailed interventions related to [facility])     Outpatient Encounter Medications as of 04/04/2020  Medication Sig Note  . AMBULATORY NON FORMULARY MEDICATION Peppermint altoids 1-2 tabs as needed   . aspirin EC 325 MG tablet Take 325 mg by mouth daily as needed (headache).   . bismuth subsalicylate (PEPTO BISMOL) 262 MG/15ML suspension Take 30 mLs by mouth as needed.   . busPIRone (BUSPAR) 5 MG tablet Take 1 tablet (5 mg total) by mouth 2 (two) times daily. (Patient not taking: Reported on 04/01/2020) 02/14/2020: Hasn't started  . Cod Liver Oil CAPS Take 1 capsule by mouth daily.   . famotidine (PEPCID) 20 MG tablet Take 1 tablet (20 mg total) by mouth 2 (two) times daily.   . flecainide (TAMBOCOR) 50 MG tablet TAKE 2 TABLETS EVERY MORNING AND 1 TABLET AT NIGHT   . Hyoscyamine Sulfate SL (LEVSIN/SL) 0.125 MG SUBL Take 1 to 2 sublingual tablets(under the tongue) as needed for spasms.   . nitroGLYCERIN (NITROSTAT) 0.3 MG SL tablet Place 1 tablet (0.3 mg total) under the tongue every 5 (five) minutes as needed (esophageal pain. Do not take any more than 3 doses in 24 hours.). (Patient not taking: Reported on 04/01/2020) 04/01/2020: On hand    . sucralfate (CARAFATE) 1 GM/10ML suspension Take 10 mLs (1 g total) by mouth 4 (four) times daily. (Patient not taking: Reported on 04/01/2020)    No facility-administered encounter medications on file as of 04/04/2020.    Goals Addressed              This Visit's Progress   .  "I told the social worker  I need help in the home" (pt-stated)         Current Barriers:  . Chronic Disease Management support, education, and care coordination needs related to HTN ,Fall Risk and Osteoporosis  Case Manager Clinical Goal(s):  Marland Kitchen Over the next 60 days, patient will work with BSW to address needs related to Financial constraints related to need for help in the home. . ADL IADL limitations in patient with HTN, Fall Risk and Osteoporosis   Interventions:  . Collaborated with BSW to initiate plan of care to address needs related to Financial constraints related to need for help in the home . ADL IADL limitations in patient with HTN. Fall Risk and Osteoporosis   Patient Self Care Activities:  . Self administers medications as prescribed . Attends all scheduled provider appointments . Calls pharmacy for medication refills . Calls provider office for new concerns or questions  Initial goal documentation         Follow up plan:  The care management team will reach out to the patient again over the next 60 days.   [name] [name] was given  information about Care Management services today including:  1. Care Management services include personalized support from designated clinical staff supervised by a physician, including individualized plan of care and coordination with other care providers 2. 24/7 contact phone numbers for assistance for urgent and routine care needs. 3. The patient may stop Care Management services at any time (effective at the end of the month) by phone call to the office staff.  Patient agreed to services and verbal consent obtained.  Lazaro Arms RN, BSN, [facility]  Management Coordinator [place] Phone: [phone] Fax: [phone]

## 2020-04-07 ENCOUNTER — Telehealth: Payer: Self-pay | Admitting: Gastroenterology

## 2020-04-07 NOTE — Telephone Encounter (Signed)
Patients son calling Delfino Lovett) states he is seeking advise for his mother is not eating well and is having a lot of disstress

## 2020-04-07 NOTE — Telephone Encounter (Signed)
Patients son is requesting to speak with a nurse

## 2020-04-07 NOTE — Telephone Encounter (Signed)
The pt son has been advised and he will call with any further concerns.

## 2020-04-07 NOTE — Patient Instructions (Addendum)
Social Worker Visit Information  Goals we discussed today:  Goals Addressed              This Visit's Progress   .  "I need help in the home" (pt-stated)        Current Barriers:  Marland Kitchen Knowledge Barriers related to resources and support available to address needs related to ADL IADL limitations  Patient seeking PCS but cannot afford private pay.  Patient is unsure of Medicaid status.  Patient lives alone but states that her son lives next door and checks on her multiple times a day.   Case Manager Clinical Goal(s):  Marland Kitchen Over the next 60 days, patient will work with BSW to address needs related to ADL IADL limitations/ obtaining PCS . Over the next 60 days, BSW will collaborate with RN Care Manager to address care management and care coordination needs  Interventions:  . Patient interviewed and appropriate assessments performed . Sent message to [facility] Staff, Lacy Duverney, to confirm that patient has Medicaid and, if so, what type . Left message for Ludger Nutting with [facility] Specialist Office to inquire about eligibility for Aide & Attendance Services due to patient being widow of veteran.  Nash Dimmer with RN Care Manager and patient to establish an individualized plan of care  Patient Self Care Activities:  . Self administers medications as prescribed . Attends all scheduled provider appointments . Calls provider office for new concerns or questions  Initial goal documentation          Ms. Houchin was given information about Chronic Care Management services today including:  1. CCM service includes personalized support from designated clinical staff supervised by her physician, including individualized plan of care and coordination with other care providers 2. 24/7 contact phone numbers for assistance for urgent and routine care needs. 3. Service will only be billed when office clinical staff spend 20 minutes or more in a month to coordinate care. 4. Only one practitioner may  furnish and bill the service in a calendar month. 5. The patient may stop CCM services at any time (effective at the end of the month) by phone call to the office staff. 6. The patient will be responsible for cost sharing (co-pay) of up to 20% of the service fee (after annual deductible is met).    Patient verbalizes understanding of instructions.   CCM BSW will follow up with patient within the next 7 business days.     Ronn Melena, [place] Coordination Social Worker [place] [phone]

## 2020-04-07 NOTE — Chronic Care Management (AMB) (Signed)
Chronic Care Management    Clinical Social Work General Note  04/07/2020 Name: [name] [name] MRN: [id number] DOB: 1929-06-15  [name] [name] is a 86 y.o. year old female who is a primary care patient of [name] Payment, MD. The CCM was consulted to assist the patient with in home assistance  [name] [name] was given information about Chronic Care Management services today including:  1. CCM service includes personalized support from designated clinical staff supervised by her physician, including individualized plan of care and coordination with other care providers 2. 24/7 contact phone numbers for assistance for urgent and routine care needs. 3. Service will only be billed when office clinical staff spend 20 minutes or more in a month to coordinate care. 4. Only one practitioner may furnish and bill the service in a calendar month. 5. The patient may stop CCM services at any time (effective at the end of the month) by phone call to the office staff. 6. The patient will be responsible for cost sharing (co-pay) of up to 20% of the service fee (after annual deductible is met)  Review of patient status, including review of consultants reports, relevant laboratory and other test results, and collaboration with appropriate care team members and the patient's provider was performed as part of comprehensive patient evaluation and provision of chronic care management services.    SDOH (Social Determinants of Health) assessments and interventions performed:  Yes    Outpatient Encounter Medications as of 04/04/2020  Medication Sig Note  . AMBULATORY NON FORMULARY MEDICATION Peppermint altoids 1-2 tabs as needed   . aspirin EC 325 MG tablet Take 325 mg by mouth daily as needed (headache).   . bismuth subsalicylate (PEPTO BISMOL) 262 MG/15ML suspension Take 30 mLs by mouth as needed.   . busPIRone (BUSPAR) 5 MG tablet Take 1 tablet (5 mg total) by mouth 2 (two) times daily. (Patient not taking: Reported on  04/01/2020) 02/14/2020: Hasn't started  . Cod Liver Oil CAPS Take 1 capsule by mouth daily.   . famotidine (PEPCID) 20 MG tablet Take 1 tablet (20 mg total) by mouth 2 (two) times daily.   . flecainide (TAMBOCOR) 50 MG tablet TAKE 2 TABLETS EVERY MORNING AND 1 TABLET AT NIGHT   . Hyoscyamine Sulfate SL (LEVSIN/SL) 0.125 MG SUBL Take 1 to 2 sublingual tablets(under the tongue) as needed for spasms.   . nitroGLYCERIN (NITROSTAT) 0.3 MG SL tablet Place 1 tablet (0.3 mg total) under the tongue every 5 (five) minutes as needed (esophageal pain. Do not take any more than 3 doses in 24 hours.). (Patient not taking: Reported on 04/01/2020) 04/01/2020: On hand  . sucralfate (CARAFATE) 1 GM/10ML suspension Take 10 mLs (1 g total) by mouth 4 (four) times daily. (Patient not taking: Reported on 04/01/2020)    No facility-administered encounter medications on file as of 04/04/2020.    Goals Addressed              This Visit's Progress   .  "I need help in the home" (pt-stated)        Current Barriers:  Marland Kitchen Knowledge Barriers related to resources and support available to address needs related to ADL IADL limitations  Patient seeking PCS but cannot afford private pay.  Patient is unsure of Medicaid status.  Patient lives alone but states that her son lives next door and checks on her multiple times a day.   Case Manager Clinical Goal(s):  Marland Kitchen Over the next 60 days, patient will work with  BSW to address needs related to ADL IADL limitations/ obtaining PCS . Over the next 60 days, BSW will collaborate with RN Care Manager to address care management and care coordination needs  Interventions:  . Patient interviewed and appropriate assessments performed . Sent message to [facility] Staff, [name] [name], to confirm that patient has Medicaid and, if so, what type . Left message for [name] [name] with [facility] Specialist Office to inquire about eligibility for Aide & Attendance Services due to patient being widow of  veteran.  [name] [name] with RN Care Manager and patient to establish an individualized plan of care  Patient Self Care Activities:  . Self administers medications as prescribed . Attends all scheduled provider appointments . Calls provider office for new concerns or questions  Initial goal documentation         CCM BSW will follow up with patient within the next 7 business days.     [name] [name], [place] Coordination Social Worker [place] [phone]

## 2020-04-07 NOTE — Telephone Encounter (Signed)
I agree with your recommendations. Thanks.  

## 2020-04-07 NOTE — Telephone Encounter (Signed)
The pt son called and states the pt has been having continued "spasms" in her esophagus.  She has not been taking her levsin as prescribed. Her son is going to have her start taking levsin 2 pills 3 times daily. I will also forward to Dr Ardis Hughs for review. She is scheduled for manometry on 12/15 (this is the soonest available).

## 2020-04-09 ENCOUNTER — Ambulatory Visit: Payer: Medicare Other

## 2020-04-09 DIAGNOSIS — I10 Essential (primary) hypertension: Secondary | ICD-10-CM

## 2020-04-09 DIAGNOSIS — R1319 Other dysphagia: Secondary | ICD-10-CM

## 2020-04-09 NOTE — Chronic Care Management (AMB) (Signed)
  Care Management   Follow Up Note   04/09/2020 Name: [name] [name] MRN: [id number] DOB: 1929-11-29  [name] [name] is enrolled in a Managed Medicaid plan: Yes. Outreach attempt today was successful.    Referred by: [name], MD Reason for referral : Care Coordination (PCS, Eligibity for [place] services)   [name] [name] is a 86 y.o. year old female who is a primary care patient of [name], MD. The care management team was consulted for assistance with care management and care coordination needs.    Review of patient status, including review of consultants reports, relevant laboratory and other test results, and collaboration with appropriate care team members and the patient's provider was performed as part of comprehensive patient evaluation and provision of chronic care management services.    Goals Addressed              This Visit's Progress   .  "I need help in the home" (pt-stated)        Current Barriers:  Marland Kitchen Knowledge Barriers related to resources and support available to address needs related to ADL IADL limitations  Patient seeking PCS but cannot afford private pay.  Patient is unsure of Medicaid status.  Patient lives alone but states that her son lives next door and checks on her multiple times a day.   Case Manager Clinical Goal(s):  Marland Kitchen Over the next 60 days, patient will work with BSW to address needs related to ADL IADL limitations/ obtaining PCS . Over the next 60 days, BSW will collaborate with RN Care Manager to address care management and care coordination needs  Interventions:  . Informed patient that she appears to have type of Medicaid that will cover PCS . Reminded patient of process for requesting services . Completed DMA-3051, Request for PCS, and placed in [facility] Red Team box for review/signature. . In basket message sent to team to review/signature . Informed patient that CCM BSW will contact her when assessment can be scheduled . Received return call from  Laurell Josephs with [place]  . Will inform patient during follow up call that she should be eligible for VA services and process for initiating assistance (she or her son can report to [place] office on [address] to meet with representative.  Need to take DD 517 and death certificate of spouse) .  Marland Kitchen Patient Self Care Activities:  . Self administers medications as prescribed . Attends all scheduled provider appointments . Calls provider office for new concerns or questions  Please see past updates related to this goal by clicking on the "Past Updates" button in the selected goal          Will follow up with patient within the next three days.     Ronn Melena, [place] Coordination Social Worker [place] [phone]

## 2020-04-09 NOTE — Patient Instructions (Signed)
Visit Information  Goals Addressed              This Visit's Progress   .  "I need help in the home" (pt-stated)        Current Barriers:  Marland Kitchen Knowledge Barriers related to resources and support available to address needs related to ADL IADL limitations  Patient seeking PCS but cannot afford private pay.  Patient is unsure of Medicaid status.  Patient lives alone but states that her son lives next door and checks on her multiple times a day.   Case Manager Clinical Goal(s):  Marland Kitchen Over the next 60 days, patient will work with BSW to address needs related to ADL IADL limitations/ obtaining PCS . Over the next 60 days, BSW will collaborate with RN Care Manager to address care management and care coordination needs  Interventions:  . Informed patient that she appears to have type of Medicaid that will cover PCS . Reminded patient of process for requesting services . Completed DMA-3051, Request for PCS, and placed in [facility] Red Team box for review/signature. . In basket message sent to team to review/signature . Informed patient that CCM BSW will contact her when assessment can be scheduled . Received return call from Laurell Josephs with [place]  . Will inform patient during follow up call that she should be eligible for VA services and process for initiating assistance (she or her son can report to [place] office on [address] to meet with representative.  Need to take DD 734 and death certificate of spouse) .  Marland Kitchen Patient Self Care Activities:  . Self administers medications as prescribed . Attends all scheduled provider appointments . Calls provider office for new concerns or questions  Please see past updates related to this goal by clicking on the "Past Updates" button in the selected goal         The patient verbalized understanding of instructions, educational materials, and care plan provided today and declined offer to receive copy of patient  instructions, educational materials, and care plan.   Will follow up with patient within the next three days.     Ronn Melena, [place] Coordination Social Worker [place] [phone]

## 2020-04-09 NOTE — Progress Notes (Signed)
Internal Medicine Clinic Resident  I have personally reviewed this encounter including the documentation in this note and/or discussed this patient with the care management provider. I will address any urgent items identified by the care management provider and will communicate my actions to the patient's PCP. I have reviewed the patient's CCM visit with my supervising attending, Dr Vincent.  Camisha Srey, MD 04/09/2020  

## 2020-04-09 NOTE — Progress Notes (Signed)
Internal Medicine Clinic Attending  CCM services provided by the care management provider and their documentation were discussed with Dr. Basaraba. We reviewed the pertinent findings, urgent action items addressed by the resident and non-urgent items to be addressed by the PCP.  I agree with the assessment, diagnosis, and plan of care documented in the CCM and resident's note.  Haze Antillon Thomas Terren Jandreau, MD 04/09/2020  

## 2020-04-09 NOTE — Progress Notes (Signed)
Internal Medicine Clinic Attending  CCM services provided by the care management provider and their documentation were discussed with Dr. Basaraba. We reviewed the pertinent findings, urgent action items addressed by the resident and non-urgent items to be addressed by the PCP.  I agree with the assessment, diagnosis, and plan of care documented in the CCM and resident's note.  Lovelee Forner Thomas Yesennia Hirota, MD 04/09/2020  

## 2020-04-09 NOTE — Progress Notes (Signed)
Internal Medicine Clinic Resident  I have personally reviewed this encounter including the documentation in this note and/or discussed this patient with the care management provider. I will address any urgent items identified by the care management provider and will communicate my actions to the patient's PCP. I have reviewed the patient's CCM visit with my supervising attending, Dr Vincent.  Nathaneal Sommers, MD 04/09/2020  

## 2020-04-10 ENCOUNTER — Ambulatory Visit: Payer: Medicare Other

## 2020-04-10 DIAGNOSIS — R1319 Other dysphagia: Secondary | ICD-10-CM

## 2020-04-10 DIAGNOSIS — Z23 Encounter for immunization: Secondary | ICD-10-CM | POA: Diagnosis not present

## 2020-04-10 DIAGNOSIS — E785 Hyperlipidemia, unspecified: Secondary | ICD-10-CM

## 2020-04-10 DIAGNOSIS — I10 Essential (primary) hypertension: Secondary | ICD-10-CM

## 2020-04-10 NOTE — Progress Notes (Signed)
Internal Medicine Clinic Resident  I have personally reviewed this encounter including the documentation in this note and/or discussed this patient with the care management provider. I will address any urgent items identified by the care management provider and will communicate my actions to the patient's PCP. I have reviewed the patient's CCM visit with my supervising attending, Dr Heber Onyx.  Sanjuana Letters, MD 04/10/2020

## 2020-04-10 NOTE — Chronic Care Management (AMB) (Signed)
  Care Management   Follow Up Note   04/10/2020 Name: [name] [name] MRN: [id number] DOB: Dec 10, 1929  [name] [name] is enrolled in a Managed Medicaid plan: No. Outreach attempt today was successful.    Referred by: [name], MD Reason for referral : Care Coordination (PCS)   [name] [name] is a 86 y.o. year old female who is a primary care patient of [name], MD. The care management team was consulted for assistance with care management and care coordination needs.    Review of patient status, including review of consultants reports, relevant laboratory and other test results, and collaboration with appropriate care team members and the patient's provider was performed as part of comprehensive patient evaluation and provision of chronic care management services.    Goals Addressed              This Visit's Progress   .  "I need help in the home" (pt-stated)        Current Barriers:  Marland Kitchen Knowledge Barriers related to resources and support available to address needs related to ADL IADL limitations  Patient seeking PCS but cannot afford private pay.  Patient is unsure of Medicaid status.  Patient lives alone but states that her son lives next door and checks on her multiple times a day.   Case Manager Clinical Goal(s):  Marland Kitchen Over the next 60 days, patient will work with BSW to address needs related to ADL IADL limitations/ obtaining PCS . Over the next 60 days, BSW will collaborate with RN Care Manager to address care management and care coordination needs  Interventions:  . Request for [facility] faxed to [employer]  . Patient Self Care Activities:  . Self administers medications as prescribed . Attends all scheduled provider appointments . Calls provider office for new concerns or questions  Please see past updates related to this goal by clicking on the "Past Updates" button in the selected goal          Will contact [place] on 04/11/20 to ensure receipt of  request form and that assessment can be completed.     Ronn Melena, [place] Coordination Social Worker [place] [phone]

## 2020-04-10 NOTE — Patient Instructions (Signed)
Visit Information  Goals Addressed              This Visit's Progress   .  "I need help in the home" (pt-stated)        Current Barriers:  Marland Kitchen Knowledge Barriers related to resources and support available to address needs related to ADL IADL limitations  Patient seeking PCS but cannot afford private pay.  Patient is unsure of Medicaid status.  Patient lives alone but states that her son lives next door and checks on her multiple times a day.   Case Manager Clinical Goal(s):  Marland Kitchen Over the next 60 days, patient will work with BSW to address needs related to ADL IADL limitations/ obtaining PCS . Over the next 60 days, BSW will collaborate with RN Care Manager to address care management and care coordination needs  Interventions:  . Request for [facility] faxed to [employer]  . Patient Self Care Activities:  . Self administers medications as prescribed . Attends all scheduled provider appointments . Calls provider office for new concerns or questions  Please see past updates related to this goal by clicking on the "Past Updates" button in the selected goal        Will contact [place] on 04/11/20 to ensure receipt of request form and that assessment can be completed.     Ronn Melena, [place] Coordination Social Worker [place] [phone]

## 2020-04-11 ENCOUNTER — Ambulatory Visit: Payer: Medicare Other

## 2020-04-11 DIAGNOSIS — I10 Essential (primary) hypertension: Secondary | ICD-10-CM

## 2020-04-11 DIAGNOSIS — E785 Hyperlipidemia, unspecified: Secondary | ICD-10-CM

## 2020-04-11 DIAGNOSIS — R1319 Other dysphagia: Secondary | ICD-10-CM

## 2020-04-11 NOTE — Chronic Care Management (AMB) (Signed)
  Care Management   Follow Up Note   04/11/2020 Name: [name] [name] MRN: [id number] DOB: 08-03-1929  [name] [name] is enrolled in a Managed Medicaid plan: No. Outreach attempt today was successful.    Referred by: [name] Payment, MD Reason for referral : Care Coordination (PCS, VA services)   [name] [name] is a 86 y.o. year old female who is a primary care patient of [name] Payment, MD. The care management team was consulted for assistance with care management and care coordination needs.    Review of patient status, including review of consultants reports, relevant laboratory and other test results, and collaboration with appropriate care team members and the patient's provider was performed as part of comprehensive patient evaluation and provision of chronic care management services.    Goals Addressed              This Visit's Progress   .  "I need help in the home" (pt-stated)        Current Barriers:  [name] [name] Kitchen Knowledge Barriers related to resources and support available to address needs related to ADL IADL limitations  Patient seeking PCS but cannot afford private pay.  Patient is unsure of Medicaid status.  Patient lives alone but states that her son lives next door and checks on her multiple times a day.   Case [name] Clinical Goal(s):  [name] [name] Kitchen Over the next 60 days, patient will work with [name] [name] to address needs related to ADL IADL limitations/ obtaining PCS . Over the next 60 days, [name] [name] will collaborate with [name] [name] to address care management and care coordination needs  Interventions:  . [name] [name] to ensure receipt of request for PCS  . Contacted patient to inform her that assessment for PCS can be scheduled . Provided patient with contact information for [name] [name] and encouraged her to call as soon as possible  . Informed patient that she/son can go to [name] with DD 214 and spouse's death certificate to meet with VA representative regarding services she may  be eligible for  . Patient Self Care Activities:  . Self administers medications as prescribed . Attends all scheduled provider appointments . Calls provider office for new concerns or questions  Please see past updates related to this goal by clicking on the "Past Updates" button in the selected goal          The care management team will reach out to the patient again over the next 14 days.     [name] [name], [name] [name] [employer] Coordination Social Worker [name] [name] [phone]

## 2020-04-11 NOTE — Progress Notes (Signed)
Internal Medicine Clinic Resident  I have personally reviewed this encounter including the documentation in this note and/or discussed this patient with the care management provider. I will address any urgent items identified by the care management provider and will communicate my actions to the patient's PCP. I have reviewed the patient's CCM visit with my supervising attending, Dr Heber Hailey.  Jose Persia, MD 04/11/2020

## 2020-04-11 NOTE — Patient Instructions (Signed)
Visit Information  Goals Addressed              This Visit's Progress   .  "I need help in the home" (pt-stated)        Current Barriers:  Marland Kitchen Knowledge Barriers related to resources and support available to address needs related to ADL IADL limitations  Patient seeking PCS but cannot afford private pay.  Patient is unsure of Medicaid status.  Patient lives alone but states that her son lives next door and checks on her multiple times a day.   Case Manager Clinical Goal(s):  Marland Kitchen Over the next 60 days, patient will work with BSW to address needs related to ADL IADL limitations/ obtaining PCS . Over the next 60 days, BSW will collaborate with RN Care Manager to address care management and care coordination needs  Interventions:  . [place] to ensure receipt of request for PCS  . Contacted patient to inform her that assessment for PCS can be scheduled . Provided patient with contact information for [facility] and encouraged her to call as soon as possible  . Informed patient that she/son can go to [place] with DD 214 and spouse's death certificate to meet with VA representative regarding services she may be eligible for  . Patient Self Care Activities:  . Self administers medications as prescribed . Attends all scheduled provider appointments . Calls provider office for new concerns or questions  Please see past updates related to this goal by clicking on the "Past Updates" button in the selected goal         The patient verbalized understanding of instructions, educational materials, and care plan provided today and declined offer to receive copy of patient instructions, educational materials, and care plan.   The care management team will reach out to the patient again over the next 14 days.     Ronn Melena, [place] Coordination Social Worker [place] [phone]

## 2020-04-19 ENCOUNTER — Telehealth: Payer: Self-pay | Admitting: Internal Medicine

## 2020-04-19 NOTE — Telephone Encounter (Signed)
Internal [facility] 24h Emergency Line Telephone Encounter  Pt is requesting advice on her home blood pressure readings. She notes that her blood pressures have been ranging from 150-170s over 70s-110s today and is concerned about this fluctuation. She has been checking her blood pressure multiple times per hour due to her concern. She denies headache, vision changes, shortness of breath or chest pain.   Discussed with her that her blood pressures are in a stable range although further evaluation is limited via telephone. PLAN --She is encouraged to go to the ED for the following: headache, vision changes, chest pain and shortness of breath or if SBP is >200 OR DBP>120.  --She is encouraged to arrange an office appointment in [facility] to discuss blood pressure management next week. Will send message to the front desk to call on their next working business day.  --She is also encouraged to reduce the frequency of her blood pressure checks at home to once or twice a day as it seems that they are very anxiety provoking for her.  Mitzi Hansen, MD Internal Medicine Resident PGY-2 [facility] Pager: [phone] 04/19/2020 5:10 PM

## 2020-04-21 ENCOUNTER — Telehealth: Payer: Self-pay | Admitting: *Deleted

## 2020-04-21 ENCOUNTER — Telehealth: Payer: Self-pay | Admitting: Internal Medicine

## 2020-04-21 ENCOUNTER — Telehealth: Payer: Medicare Other

## 2020-04-21 NOTE — Telephone Encounter (Signed)
Returned call to Pt.  Per Pt she had palpitations last night "that about killed me".  She states she took one tablet of metoprolol succinate that she had and within a half hour the palpitations had resolved.  Appears Toprol was taken off Pt's med list after a recent hospitalization because Pt's hypertension was well controlled.  We discussed why she was taking Toprol (for palpitations).  Advised Pt would discuss with Dr. Lovena Le and confirm ok to add Toprol back to Pt's med list to take as needed for breakthrough palpitations.  Pt thanked nurse for call.

## 2020-04-21 NOTE — Telephone Encounter (Signed)
New message:    Patient calling stating that her medications are no longer working and would like to speak with some one.

## 2020-04-21 NOTE — Chronic Care Management (AMB) (Signed)
°  Care Management   Note  04/21/2020 Name: Meriam Chojnowski Augustus MRN: [id number] DOB: 1929/08/15  Odella Aquas Arno is a 86 y.o. year old female who is a primary care patient of [name], MD and is actively engaged with the care management team. I reached out to [place] by phone today to assist with re-scheduling an initial visit with the RN Case Manager  Follow up plan: Unsuccessful telephone outreach attempt made. The care management team will reach out to the patient again over the next 7 days. If patient returns call to provider office, please advise to call [place] at [phone].  [place] Management  Direct Dial: [phone]

## 2020-04-22 MED ORDER — METOPROLOL SUCCINATE ER 25 MG PO TB24: ORAL_TABLET | ORAL | 3 refills | Status: DC

## 2020-04-22 NOTE — Chronic Care Management (AMB) (Signed)
  Care Management   Note  04/22/2020 Name: [name] [name] MRN: [id number] DOB: 09-22-1929  Odella Aquas Gens is a 86 y.o. year old female who is a primary care patient of [name], MD and is actively engaged with the care management team. I reached out to [place] by phone today to assist with re-scheduling an initial visit with the RN Case Manager  Follow up plan: Face to Face appointment with care management team member scheduled for: 04/24/2020  [place] Management

## 2020-04-22 NOTE — Telephone Encounter (Signed)
Returned call to Pt.  She states she is feeling better today.  Advised per Dr. Lovena Le ok to take metoprolol succinate as needed for palpitations.  [facility] per Pt request.  Pt has upcoming appt with RU.

## 2020-04-23 ENCOUNTER — Encounter: Payer: Medicare Other | Admitting: Internal Medicine

## 2020-04-23 ENCOUNTER — Telehealth: Payer: Self-pay | Admitting: Dietician

## 2020-04-23 DIAGNOSIS — F419 Anxiety disorder, unspecified: Secondary | ICD-10-CM

## 2020-04-23 NOTE — Telephone Encounter (Signed)
Nutrition follow up: Call to patient to follow up on patient's intake/weight. She reports limiting intake to 1-2 ounces at a time because it takes so long to reach her stomach. Puts sweet potatoes, cool whip, soy milk in a blender, Eats mashed potatoes, Ensure , ice cream, whole milk 1-2 ounce at a time. "I use the levsin, it works well." Drinks a lot of liquids- carrot juice, other juices,water, hi protein, high calorie Ensure of which she gets in 2/day.  P:  Agrees to talk to behavioral health for anxiety/stress. Agrees to drink more liquids with calories instead of water. Continue high calorie diet with 6 small meals. Debera Lat, RD 04/23/2020 3:49 PM.

## 2020-04-24 ENCOUNTER — Encounter: Payer: Medicare Other | Admitting: Internal Medicine

## 2020-04-24 ENCOUNTER — Other Ambulatory Visit: Payer: Self-pay

## 2020-04-24 ENCOUNTER — Ambulatory Visit: Payer: Medicare Other

## 2020-04-24 ENCOUNTER — Ambulatory Visit: Payer: Medicare Other | Admitting: Behavioral Health

## 2020-04-24 ENCOUNTER — Encounter: Payer: Self-pay | Admitting: Internal Medicine

## 2020-04-24 ENCOUNTER — Ambulatory Visit (INDEPENDENT_AMBULATORY_CARE_PROVIDER_SITE_OTHER): Payer: Medicare Other | Admitting: Internal Medicine

## 2020-04-24 DIAGNOSIS — I10 Essential (primary) hypertension: Secondary | ICD-10-CM

## 2020-04-24 DIAGNOSIS — R1319 Other dysphagia: Secondary | ICD-10-CM

## 2020-04-24 DIAGNOSIS — F419 Anxiety disorder, unspecified: Secondary | ICD-10-CM

## 2020-04-24 DIAGNOSIS — R002 Palpitations: Secondary | ICD-10-CM

## 2020-04-24 DIAGNOSIS — R12 Heartburn: Secondary | ICD-10-CM

## 2020-04-24 DIAGNOSIS — E785 Hyperlipidemia, unspecified: Secondary | ICD-10-CM

## 2020-04-24 NOTE — BH Specialist Note (Signed)
Integrated Behavioral Health Initial In-Person Visit  MRN: [id number] Name: [name] [name]  Number of [place] Clinician visits:: 1/6 Session Start time: 3:20p  Session End time: 3:50p Total time: 30 minutes  Types of Service: Individual psychotherapy  Interpretor:No. Interpretor Name and Language: n/a   Warm Hand Off Completed.       Subjective: [name] [name] is a 86 y.o. female accompanied by Son who stayed in Wait Room Patient was referred by Dr. Marianna [name] for Cslg about health status changes. Patient reports the following symptoms/concerns: fatigue & bilateral lower extremity weakness, pain in esophagus when consuming nutrition, & heart palpitations aggrivated by esophageal issues. Pt feels the situation w/her Px health is impacting her now after dealing w/issues for past 5 mos.  Duration of problem: Px health since June/Mental health since past Sunday when she spoke w/RN; Severity of problem: moderate  Objective: Mood: Anxious and Affect: Appropriate Risk of harm to self or others: No plan to harm self or others  Life Context: Family and Social: Pt has grown adult Son [name] [name]) who drove her to the visit today. He is concerned & amazed she has tolerated the situation w/her Px health this long. School/Work: Pt is 86yo f who loves to work in her yard all day, but has been restricted to minding her issues w/her esophagus for almost 6 mos now. This has kept her inside the home dealing w/pain & discomfort. Pt lost her Husb in 2020 to health issues. She has been widowed for a little over a year now.  Self-Care: Pt is caring for self, just not at same level as usual. She is anxious about the upcoming procedure planned for 05/07/2020 (endoscopy?). She has lost btwn 30-40#. Pt is worried for the status of her health & the impact this situation is having on her mental health. Life Changes: Extreme changes in eating habits due to issues w/the esophagus. Pt has exp'd several episodes  of debilitating pain due to spasms & nerve-related discomfort.  Patient and/or Family's Strengths/Protective Factors: Social and Emotional competence, Concrete supports in place (healthy food, safe environments, etc.) and Pt is grateful for newer sublingual prescription that has helped her a great deal & she uses this med prudently.  Goals Addressed: Patient will: 1. Reduce symptoms of: anxiety and depression 2. Increase knowledge and/or ability of: coping skills and psychoedu about the mental impact our Px health can have on [place] as we grow older. Dealing w/uncertainty.  3. Demonstrate ability to: Increase healthy adjustment to current life circumstances and Begin healthy grieving over loss, however temporary, of more robust Px health.  Progress towards Goals: Initiated agreement w/Pt we would connect weekly to talk.  Interventions: Interventions utilized: Supportive Counseling and & emot'l support for aging process.  Standardized Assessments completed: Not Needed  Patient and/or Family Response: Son I met today is supportive & transports Pt to all appts. He presents as loving & concerned.  Patient Centered Plan: Patient is on the following Treatment Plan(s):  Pt is using her medication judiciously & agrees to psychotherapy appts in the future; either late morning (11:00a) or early afternoon.  Assessment: Patient currently experiencing leg weakness the past 3 days. Pt is concerned for her mental health bc she is normally a fit, strong person mentally. Encouraged Pt's definition of self as resilient, while supporting her current feelings of vulnerability.   Patient may benefit from weekly calls from Clinician using telehealth.  Plan: 1. Follow up with behavioral health clinician on : next  week 2. Behavioral recommendations: Cont w/all recommendations from Physicians. Cont to be encouraged by hopeful stories you hear. Seek out the positive. 3. Referral(s): [place] (In Clinic) 4. "From scale of 1-10, how likely are you to follow plan?": Blunt, LMFT

## 2020-04-24 NOTE — Assessment & Plan Note (Signed)
Situational depression due to her dysphagia and spasms. She previously tried buspar and did not like the way this made her feel. She does not feel that medications would necessarily help at this time, but would like to speak with behavioral health.   - meet with Dr. Theodis Shove today.

## 2020-04-24 NOTE — Progress Notes (Signed)
Internal Medicine Clinic Resident  I have personally reviewed this encounter including the documentation in this note and/or discussed this patient with the care management provider. I will address any urgent items identified by the care management provider and will communicate my actions to the patient's PCP. I have reviewed the patient's CCM visit with my supervising attending, Dr Raines.  [name] [name] K [name], MD 04/24/2020   

## 2020-04-24 NOTE — Progress Notes (Signed)
   CC: hypertension  HPI:  [name] is a 86 y.o. with PMH as below.   Please see A&P for assessment of the patient's acute and chronic medical conditions.   Past Medical History:  Diagnosis Date  . Anemia    iron def  . Anxiety   . Arrhythmia    paroxysmal SVT and symtomatic PAC's  . Diverticulosis    colon  . Elevated troponin   . GERD 04/02/2008   Qualifier: Diagnosis of  By: Jenny Reichmann MD, Hunt Oris   . History of colonic polyps    cecal  . Hypertension   . 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 Osteoporosis 07/21/2011   t score -2.7  . Palpitations 05/24/2017  . Stricture esophagus   . Vitamin D deficiency    2010   Review of Systems:   Review of Systems  Cardiovascular: Positive for palpitations. Negative for leg swelling.  Gastrointestinal: Positive for heartburn. Negative for constipation, diarrhea, nausea and vomiting.  Neurological: Negative for dizziness, focal weakness and weakness.     Physical Exam:   Constitution: NAD, appears stated age Eyes: no icterus or injection  Cardio: RRR, no m/r/g, no LE edema  Respiratory: CTA, no w/r/r Abdominal: NTTP, soft, non-distended MSK: moving all extremities Neuro: normal affect, a&ox3, pleasant Skin: c/d/i    Vitals:   04/24/20 1347  BP: (!) 144/77  Pulse: 68  Temp: 97.8 F (36.6 C)  TempSrc: Oral  SpO2: 99%  Weight: 120 lb 14.4 oz (54.8 kg)  Height: 5\' 5"  (1.651 m)    Assessment & Plan:   See Encounters Tab for problem based charting.  Patient discussed with Dr. Jimmye Norman

## 2020-04-24 NOTE — Assessment & Plan Note (Signed)
BP Readings from Last 3 Encounters:  04/24/20 (!) 144/77  04/01/20 140/80  03/28/20 (!) 166/83   Previously taking metoprolol 12.5 mg daily, which was discontinued at last discharge due to normotension. She states she was also told to stop this by an ED provider but is unsure why. She is on procainamide as well. She was having intermittent palpitations with her esophageal spasms and was told to take metoprolol prn, which she thinks has helped the palpitations.  - start metoprolol 12.5 mg daily  - f/u with cardiology next week  - cont. To check blood pressure daily

## 2020-04-24 NOTE — Assessment & Plan Note (Signed)
She has been having more palpitations associated with her esophageal spasms. She is taking procainamide daily. She was taking metoprolol 12.5 mg daily per cardiology but reduced back to prn as palpitations had improved.   - with increase in palpitations will increased metop back to daily. She will f/u with cards next week.

## 2020-04-24 NOTE — Chronic Care Management (AMB) (Signed)
Chronic Care Management    Clinical Social Work Follow Up Note  04/24/2020 Name: Natha Guin Hieronymus MRN: [id number] DOB: Sep 03, 1929  Odella Aquas Weisse is a 86 y.o. year old female who is a primary care patient of [name], MD. The CCM team was consulted for assistance with PCS.   Review of patient status, including review of consultants reports, other relevant assessments, and collaboration with appropriate care team members and the patient's provider was performed as part of comprehensive patient evaluation and provision of chronic care management services.    SDOH (Social Determinants of Health) assessments performed: No    Outpatient Encounter Medications as of 04/24/2020  Medication Sig Note  . AMBULATORY NON FORMULARY MEDICATION Peppermint altoids 1-2 tabs as needed   . aspirin EC 325 MG tablet Take 325 mg by mouth daily as needed (headache).   . bismuth subsalicylate (PEPTO BISMOL) 262 MG/15ML suspension Take 30 mLs by mouth as needed.   Marland Kitchen Cod Liver Oil CAPS Take 1 capsule by mouth daily.   . famotidine (PEPCID) 20 MG tablet Take 1 tablet (20 mg total) by mouth 2 (two) times daily.   . flecainide (TAMBOCOR) 50 MG tablet TAKE 2 TABLETS EVERY MORNING AND 1 TABLET AT NIGHT   . Hyoscyamine Sulfate SL (LEVSIN/SL) 0.125 MG SUBL Take 1 to 2 sublingual tablets(under the tongue) as needed for spasms.   . metoprolol succinate (TOPROL XL) 25 MG 24 hr tablet May take 1/2 tablet by mouth daily for breakthrough palpitations.   . nitroGLYCERIN (NITROSTAT) 0.3 MG SL tablet Place 1 tablet (0.3 mg total) under the tongue every 5 (five) minutes as needed (esophageal pain. Do not take any more than 3 doses in 24 hours.). (Patient not taking: Reported on 04/01/2020) 04/01/2020: On hand   No facility-administered encounter medications on file as of 04/24/2020.     Goals Addressed              This Visit's Progress   .  "I need help in the home" (pt-stated)        Current Barriers:  Marland Kitchen Knowledge Barriers  related to resources and support available to address needs related to ADL IADL limitations  Patient seeking PCS but cannot afford private pay.  Patient is unsure of Medicaid status.  Patient lives alone but states that her son lives next door and checks on her multiple times a day.   Case Manager Clinical Goal(s):  Marland Kitchen Over the next 60 days, patient will work with BSW to address needs related to ADL IADL limitations/ obtaining PCS . Over the next 60 days, BSW will collaborate with RN Care Manager to address care management and care coordination needs  Interventions:  Marland Kitchen Met with patient at clinic regarding status of request for PCS . Inquired if son can assist patient with researching PCS agencies as patient states that she was approved for services . Informed patient that [employer] should be contacted when decision is made about service agency . Informed patient that [place] will then contact selected agency to determine if request can be accommodated.   . Ensured that patient/son met with VA representative at [place] . Encouraged patient to proceed with process for obtaining services through [place] even though she was approved for PCS . Provided patient with [facility] calendar and business cards for CCM team . Scheduled RNCM initial assessment for 05/01/20 @ 2:00 PM .   . Patient Self Care Activities:  . Self administers medications as  prescribed . Attends all scheduled provider appointments . Calls provider office for new concerns or questions  Please see past updates related to this goal by clicking on the "Past Updates" button in the selected goal          Follow Up Plan: Appointment scheduled for SW follow up with client by phone on: 04/29/20     Ronn Melena, [place] Coordination Social Worker [facility] [phone]

## 2020-04-24 NOTE — Patient Instructions (Addendum)
Licensed Clinical Social Worker Visit Information  Goals we discussed today:  Goals Addressed              This Visit's Progress   .  "I need help in the home" (pt-stated)        Current Barriers:  Marland Kitchen Knowledge Barriers related to resources and support available to address needs related to ADL IADL limitations  Patient seeking PCS but cannot afford private pay.  Patient is unsure of Medicaid status.  Patient lives alone but states that her son lives next door and checks on her multiple times a day.   Case Manager Clinical Goal(s):  Marland Kitchen Over the next 60 days, patient will work with BSW to address needs related to ADL IADL limitations/ obtaining PCS . Over the next 60 days, BSW will collaborate with RN Care Manager to address care management and care coordination needs  Interventions:  Marland Kitchen Met with patient at clinic regarding status of request for PCS . Inquired if son can assist patient with researching PCS agencies as patient states that she was approved for services . Informed patient that [employer] should be contacted when decision is made about service agency . Informed patient that [place] will then contact selected agency to determine if request can be accommodated.   . Ensured that patient/son met with VA representative at [place] . Encouraged patient to proceed with process for obtaining services through [place] even though she was approved for PCS . Provided patient with [facility] calendar and business cards for CCM team . Scheduled RNCM initial assessment for 05/01/20 @ 2:00 PM .   . Patient Self Care Activities:  . Self administers medications as prescribed . Attends all scheduled provider appointments . Calls provider office for new concerns or questions  Please see past updates related to this goal by clicking on the "Past Updates" button in the selected goal          Materials Provided: Verbal education about process for obtaining PCS  provided face to face  Follow Up Plan: Appointment scheduled for SW follow up with client by phone on: 04/29/20     Ronn Melena, [place] Worker [place] [phone]

## 2020-04-24 NOTE — Patient Instructions (Addendum)
Thank you for allowing [place] to provide your care today. Today we discussed your palpitations.     Please discuss with your cardiologist next week starting to take your metoprolol daily again for blood pressure and for continued intermittent palpitations.   Please follow-up with your cardiologist next week.     Should you have any questions or concerns please call the internal medicine clinic at [phone].

## 2020-04-25 ENCOUNTER — Telehealth: Payer: Medicare Other

## 2020-04-28 NOTE — Progress Notes (Signed)
Cardiology Office Note Date:  04/28/2020  Patient ID:  [name] [name], DOB 1929-12-08, MRN [id number] PCP:  [name], MD Electrophysiologist: Dr. Lovena Le                                                                                                      Chief Complaint:   palpitations  History of Present Illness: [name] [name] is a 86 y.o. female with history of palpitations (PSVT is mentioned in her notes, though Dr. Tanna Furry note state no sustained arrhythmias and APC's treated with Flecainide), HTN.    Historically had been maintained on flecainide for years for symptomatic PACs/PVCs though during a hospital stay CP stopped and started on metoprolol that she was very intolerant of,  and Rx Rythmol but never started. At an office visit with myself in November 2017, noted with PVCs that she was feeling/very bothersome to her, and given negative stress test resumed on her flecainide.    She was seen in f/u by Dr. Lovena Le in 2016-06-02 doing better with minimal palpitations, discussed adding metoprolol if more palpitations.  She was seen by myself in June 2018, felt like her BP has been high, getting headaches lately.  She had minimal palpitatins that were not bothersome to her, no CP, no SOB.  She denied dizziness, near syncope or syncope.  She was seeing ENT for tinittus and hearing loss just fitted for hearing aids.  Low dose metoprolol was added for BP.  Though lower dose was never started  She saw Dr. Lovena Le in Sept for f/u, she was concerned/convinced she had drank a bottle of contaminated water and was having GI issues, her BP was elevated though thought a component of anxiety, doesn't look like she started the metoprolol, advised to minimize sodium, no symptoms of palpitations.  She was admitted to [facility] with c/o palpitations, noted to have elevated Trop, cath was done without CAD without clear reason for her trop, discharged 05/26/17 to f/u with EP for symptomatic PVCs  She has had some  subsequent non-cardiac related ER visits over the last year, she last saw Dr. Lovena Le 03-Jun-2019, her husband had died just 3 weeks prior. Continued her Flecainide, no changes were made.  Mentioned her atypical CP was well controlled.  She saw her PMD in feb with some chronic sounding abdominal discomfort, recommended clear liquid diet for a few days, with prior negative imaging recommended labs, UA planned. She saw Dr. Donnetta Hutching in Feb it seems for some LE pain, not found with evidence of DVT, did not recommend sclerotherapy for a small area behind her knee.  Recommended walking program for her knee pain She was seen at an [facility] 07/27/2019 with a couple of complaints, requested Rx for her environmental allergies, though also asked for Rx for lorazepam, in discussion of her recent loss of her husband (described as tearful)  I saw her march 2021 She comes today with a few complaints.  Primarily GI, generalized abdominal discomfort, nausea, poor appetite ongoing for some months.  She has seen her PMD for this.  She  mentions that she is also suffering with post nasal gtt, hoarse voice, and a funny feeling that seems to come up into her throat from the middle of her chest, makes her feel nauseous.she has been drinking Ensures every day, poor intake otherwise.  She tried to eat better yesterday but seemed to make her heart skip more then usual No SOB, no CP, no near syncope or syncope. She generally finds her BP at home 110/60's, intermittently spikes higher She has been having difficulty with her grieving process and adjusting to life without her husband.  Though of late has been out in the yard getting some gardening going, more active and states she really felt good physically and even emotionally.  She is looking forward to the better weather/climate to be outside. Symptoms were not felt cardiac, no worrisome EKG changes, no CAD by cath on 2 years prior. Recommended to f/u with her PMD.  She saw Dr. Lovena Le July  2021, no changes were made, mentioned an ER visit with abd complaints, left AMA 2/2 prolonged wait.  04/21/2020 phone note with c/o palpitations, Dr. Lovena Le recommended addition of PRN Toprol  PMD visit 12/2/1 mentions esophageal spasms, palpitations, to continue metoprolol 12.5mg  daily  TODAY She is a little hard to follow today, skips around on different topics She c/w GI and reports following closely, planned for another esophageal dilation in the next week or two.  She has lost quite a bit of weight. "I am a shell of my former self" and just can not eat, whenever she eats she gets terrible pain, She is eating pureed food only now and thankfully this is better and has started to be able to eat a little better, she was started on PRN s/l Hycosimine and this has been helpful at times. NO CP, denies SOB She had a questionable fainting episode in Oct, says she stood up to turn off the TV and felt dizzy and thinks she blacked out for a second, when she came to she was actively falling but unable to stop herself and hit her head on the floor, required stitches.  In review of the ER note, reports that she did not have a syncopal event. Generally feels weak and poorly, and atrriibutes the fall/faint and most of her symptoms to poor eating/intake. She had an episode of palpitations about 7-10 days ago, started "right as my flecainide pill hit my stomach", lasted hours, she wanted to try a second pill but though maybe the pill she had just taken cause it. She was advised via RN that Dr. Lovena Le recommended she yse PRN metoprolol. She saw her PMD recently and her BP on the higher side and recommended discussing with [place] taking the metoprolol daily.  She does not want to do that, weary that her BP at time is already "really low". She brings a log of BPs/HRs, generally are OK   Her BP predominantly 120's-140's/70's-80's, some a bit higher, rarely noted markedly high reading HRs 70's usually, none <60 or as high  as 90  Past Medical History:  Diagnosis Date  . Anemia    iron def  . Anxiety   . Arrhythmia    paroxysmal SVT and symtomatic PAC's  . Diverticulosis    colon  . Elevated troponin   . GERD 04/02/2008   Qualifier: Diagnosis of  By: Jenny Reichmann MD, Hunt Oris   . History of colonic polyps    cecal  . Hypertension   . 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 Osteoporosis 07/21/2011   t score -2.7  . Palpitations 05/24/2017  . Stricture esophagus   . Vitamin D deficiency    2010    Past Surgical History:  Procedure Laterality Date  . A&P, enterocele repair     s/p 2003  . ABDOMINAL HYSTERECTOMY    . BIOPSY  02/05/2020   Procedure: BIOPSY;  Surgeon: Thornton Park, MD;  Location: [facility];  Service: Gastroenterology;;  . BREAST REDUCTION SURGERY     s/p 1996      PATIENT DENIES BREAST REDUCTION   . ESOPHAGEAL DILATION  02/05/2020   Procedure: ESOPHAGEAL DILATION;  Surgeon: Thornton Park, MD;  Location: [facility];  Service: Gastroenterology;;  . ESOPHAGOGASTRODUODENOSCOPY (EGD) WITH PROPOFOL N/A 02/05/2020   Procedure: ESOPHAGOGASTRODUODENOSCOPY (EGD) WITH PROPOFOL;  Surgeon: Thornton Park, MD;  Location: [facility];  Service: Gastroenterology;  Laterality: N/A;  . HEMORRHOID SURGERY    . LEFT HEART CATH AND CORONARY ANGIOGRAPHY N/A 05/25/2017   Procedure: LEFT HEART CATH AND CORONARY ANGIOGRAPHY;  Surgeon: Jettie Booze, MD;  Location: [facility];  Service: Cardiovascular;  Laterality: N/A;    Current Outpatient Medications  Medication Sig Dispense Refill  . AMBULATORY NON FORMULARY MEDICATION Peppermint altoids 1-2 tabs as needed    . aspirin EC 325 MG tablet Take 325 mg by mouth daily as needed (headache).    . bismuth subsalicylate (PEPTO BISMOL) 262 MG/15ML suspension Take 30 mLs by mouth as needed.    Marland Kitchen Cod Liver Oil CAPS Take 1 capsule by mouth daily.    . famotidine (PEPCID) 20 MG tablet  Take 1 tablet (20 mg total) by mouth 2 (two) times daily. 90 tablet 1  . flecainide (TAMBOCOR) 50 MG tablet TAKE 2 TABLETS EVERY MORNING AND 1 TABLET AT NIGHT 270 tablet 3  . Hyoscyamine Sulfate SL (LEVSIN/SL) 0.125 MG SUBL Take 1 to 2 sublingual tablets(under the tongue) as needed for spasms. 60 tablet 3  . metoprolol succinate (TOPROL XL) 25 MG 24 hr tablet May take 1/2 tablet by mouth daily for breakthrough palpitations. 45 tablet 3  . nitroGLYCERIN (NITROSTAT) 0.3 MG SL tablet Place 1 tablet (0.3 mg total) under the tongue every 5 (five) minutes as needed (esophageal pain. Do not take any more than 3 doses in 24 hours.). (Patient not taking: Reported on 04/01/2020) 100 tablet 3   No current facility-administered medications for this visit.    Allergies:   Other, Pantoprazole, Zyrtec [cetirizine], and Ciprofloxacin   Social History:  The patient  reports that she quit smoking about 51 years ago. She has never used smokeless tobacco. She reports that she does not drink alcohol and does not use drugs.   Family History:  The patient's family history includes Breast cancer (age of onset: 77) in an other family member; Breast cancer (age of onset: 74) in her daughter; Cancer in her brother; Cancer (age of onset: 52) in an other family member; Cancer (age of onset: 40) in her sister; Hypertension in her father and sister; Kidney disease in her brother; Stroke in her father and mother.  ROS:  Please see the history of present illness.  All other systems are reviewed and otherwise negative.   PHYSICAL EXAM:  VS:  LMP 05/24/1970  BMI: There is no height or weight on file to calculate BMI. Well nourished, well developed, in no acute distress  HEENT: normocephalic, atraumatic  Neck: no JVD, carotid bruits or masses  Cardiac: RRR; 1/6 SM, no extrasystoles, no rubs, or gallops Lungs:  CTA b/l, no wheezing, rhonchi or rales  Abd: soft, nontender MS: no deformity, age appropriate atrophy Ext:  no edema   Skin: warm and dry Neuro:  No gross deficits appreciated Psych: euthymic mood, full affect    EKG:  Done today and reviewed by myself  Artifact, SR 70bpm, 1st degree AVblock, PR 240ms,, QRS 98, QTc 397 08/02/19: SR 78, borderline 1st degree AVNlock, PR 234ms, QRS 17ms, QTc470ms, no ST/T changes 05/24/17 SR 69bpm, measured PR 221ms, QRS 18ms, QTc 471ms 10/28/16: SR, 76bpm, borderline 1st degree AVBlock, PR 269ms, QRS 44ms, measured QT/QTc 388ms/405ms, not significantly changed from prior    05/25/17: LHC  Mid LAD lesion is 25% stenosed.  The left ventricular systolic function is normal.  LV end diastolic pressure is low.  The left ventricular ejection fraction is 55-65% by visual estimate.  There is no aortic valve stenosis. Nonobstructive coronary artery disease in the LAD.    05/24/17: TTE  Study Conclusions - Left ventricle: The cavity size was normal. Systolic function was   vigorous. The estimated ejection fraction was in the range of 65%   to 70%. Wall motion was normal; there were no regional wall   motion abnormalities. Doppler parameters are consistent with   abnormal left ventricular relaxation (grade 1 diastolic   dysfunction). There was no evidence of elevated ventricular   filling pressure by Doppler parameters. - Aortic valve: There was no regurgitation. - Aortic root: The aortic root was normal in size. - Mitral valve: There was mild regurgitation. - Right ventricle: The cavity size was normal. Wall thickness was   normal. Systolic function was normal. - Right atrium: The atrium was normal in size. - Tricuspid valve: There was trivial regurgitation. - Pulmonary arteries: Systolic pressure was within the normal   range. - Inferior vena cava: The vessel was normal in size. - Pericardium, extracardiac: There was no pericardial effusion.    Recent Labs: 03/08/2020: ALT 12; BUN 11; Creatinine, Ser 0.72; Hemoglobin 12.1; Platelets 226; Potassium 3.3; Sodium 143   06/04/2019: Chol/HDL Ratio 2.8; Cholesterol, Total 175; HDL 62; LDL Chol Calc (NIH) 84; Triglycerides 174   CrCl cannot be calculated (Patient's most recent lab result is older than the maximum 21 days allowed.).   Wt Readings from Last 3 Encounters:  04/24/20 120 lb 14.4 oz (54.8 kg)  04/01/20 124 lb (56.2 kg)  03/28/20 125 lb 11.2 oz (57 kg)     Other studies reviewed: Additional studies/records reviewed today include: summarized above  ASSESSMENT AND PLAN:  1. Palpitations, PVCs, hx of PACs     on flecainide, PRN metoprolol     Recent palpitations  2. HTN    She is VERY reluctant to take daily metoprolol, and after discussion, to worried her BP is going to get too low.    She points out that her BP sometimes is already "really low": making note of BP log with SBPs in he 120's    Discussed that I thought she had BP room for it, but she is not agreeable, though is OK with the PRN strategy  3. Fall/syncope back in October     Likely 2/2 poor oral intake     Supine 162/80, 72bpm     Stand 148/;80, 80 (no symptoms)     Stand 94min 150/80, 80 and 148/76, 76      Encouraged to continue to ry and do better with oral intake, food  and water Plan for 2 week Zio AT given her ?syncope, flecainide, palpitations  Disposition:  See her back in 4-6 weeks, sooner if needed       Haywood Lasso, PA-C 04/28/2020 9:20 AM     [facility] [address] [place] [address] [phone] (office)  [phone] (fax)

## 2020-04-28 NOTE — Progress Notes (Signed)
Internal Medicine Clinic Attending  Case discussed with Dr. Darrick Meigs  At the time of the visit.  We reviewed the resident's history and exam and pertinent patient test results.  I agree with the assessment, diagnosis, and plan of care documented in the resident's note.  [name] [name] medical problems are significantly affecting her QOL.  Unfortunately she isn't able to take an SSRI or SNRI because she is on rhythm medication.

## 2020-04-28 NOTE — Assessment & Plan Note (Signed)
She continues to have difficulty and pain swallowing with intermittent spasming. She has been on a liquid only diet and is trying to fit in fruits and vegetables. She has had almost 20lb weight loss since August. She has follow-up with GI for manometry testing on the 15th.   - f/u with GI for manometry testing

## 2020-04-29 ENCOUNTER — Ambulatory Visit: Payer: Medicare Other

## 2020-04-29 ENCOUNTER — Telehealth: Payer: Self-pay | Admitting: *Deleted

## 2020-04-29 DIAGNOSIS — E785 Hyperlipidemia, unspecified: Secondary | ICD-10-CM

## 2020-04-29 DIAGNOSIS — I10 Essential (primary) hypertension: Secondary | ICD-10-CM

## 2020-04-29 DIAGNOSIS — R1319 Other dysphagia: Secondary | ICD-10-CM

## 2020-04-29 NOTE — Chronic Care Management (AMB) (Signed)
Chronic Care Management    Clinical Social Work Follow Up Note  04/29/2020 Name: [name] [name] MRN: [id number] DOB: 09/24/1929  [name] [name] is a 86 y.o. year old female who is a primary care patient of [name] Payment, MD. The CCM team was consulted for assistance with PCS.   Review of patient status, including review of consultants reports, other relevant assessments, and collaboration with appropriate care team members and the patient's provider was performed as part of comprehensive patient evaluation and provision of chronic care management services.    SDOH (Social Determinants of Health) assessments performed: No    Outpatient Encounter Medications as of 04/29/2020  Medication Sig Note  . AMBULATORY NON FORMULARY MEDICATION Peppermint altoids 1-2 tabs as needed   . aspirin EC 325 MG tablet Take 325 mg by mouth daily as needed (headache).   . bismuth subsalicylate (PEPTO BISMOL) 262 MG/15ML suspension Take 30 mLs by mouth as needed.   Marland Kitchen Cod Liver Oil CAPS Take 1 capsule by mouth daily.   . famotidine (PEPCID) 20 MG tablet Take 1 tablet (20 mg total) by mouth 2 (two) times daily.   . flecainide (TAMBOCOR) 50 MG tablet TAKE 2 TABLETS EVERY MORNING AND 1 TABLET AT NIGHT   . Hyoscyamine Sulfate SL (LEVSIN/SL) 0.125 MG SUBL Take 1 to 2 sublingual tablets(under the tongue) as needed for spasms.   . metoprolol succinate (TOPROL XL) 25 MG 24 hr tablet May take 1/2 tablet by mouth daily for breakthrough palpitations.   . nitroGLYCERIN (NITROSTAT) 0.3 MG SL tablet Place 1 tablet (0.3 mg total) under the tongue every 5 (five) minutes as needed (esophageal pain. Do not take any more than 3 doses in 24 hours.). (Patient not taking: Reported on 04/01/2020) 04/01/2020: On hand   No facility-administered encounter medications on file as of 04/29/2020.     Goals Addressed              This Visit's Progress   .  "I need help in the home" (pt-stated)        Current Barriers:  Marland Kitchen Knowledge Barriers  related to resources and support available to address needs related to ADL IADL limitations  Patient seeking PCS but cannot afford private pay.  Patient is unsure of Medicaid status.  Patient lives alone but states that her son lives next door and checks on her multiple times a day.   Case Manager Clinical Goal(s):  Marland Kitchen Over the next 60 days, patient will work with BSW to address needs related to ADL IADL limitations/ obtaining PCS . Over the next 60 days, BSW will collaborate with RN Care Manager to address care management and care coordination needs  Interventions:  . [place] regarding status of PCS.  Per representative, patient has not yet selected PCS agency . Informed patient that PCS agency must be selected by 05/22/20 or request process will have to start over; patient states she will call next week.  . Encouraged patient to provide [place] representative with three agencies in case first choice cannot accommodate request.  .  . Patient Self Care Activities:  . Self administers medications as prescribed . Attends all scheduled provider appointments . Calls provider office for new concerns or questions  Please see past updates related to this goal by clicking on the "Past Updates" button in the selected goal          Follow Up Plan: Appointment scheduled for SW follow up with client by phone on: 05/05/20  [name] [name], [place] Coordination Social Worker [place] [phone]

## 2020-04-29 NOTE — Patient Instructions (Signed)
Social Worker Visit Information  Goals we discussed today:  Goals Addressed              This Visit's Progress   .  "I need help in the home" (pt-stated)        Current Barriers:  Marland Kitchen Knowledge Barriers related to resources and support available to address needs related to ADL IADL limitations  Patient seeking PCS but cannot afford private pay.  Patient is unsure of Medicaid status.  Patient lives alone but states that her son lives next door and checks on her multiple times a day.   Case Manager Clinical Goal(s):  Marland Kitchen Over the next 60 days, patient will work with BSW to address needs related to ADL IADL limitations/ obtaining PCS . Over the next 60 days, BSW will collaborate with RN Care Manager to address care management and care coordination needs  Interventions:  . [place] regarding status of PCS.  Per representative, patient has not yet selected PCS agency . Informed patient that PCS agency must be selected by 05/22/20 or request process will have to start over; patient states she will call next week.  . Encouraged patient to provide [place] representative with three agencies in case first choice cannot accommodate request.  .  . Patient Self Care Activities:  . Self administers medications as prescribed . Attends all scheduled provider appointments . Calls provider office for new concerns or questions  Please see past updates related to this goal by clicking on the "Past Updates" button in the selected goal          Materials Provided: Verbal education about obtaining PCS provided by phone  Follow Up Plan: Appointment scheduled for SW follow up with client by phone on: 05/05/20     Ronn Melena, [place] Worker [place] [phone]

## 2020-04-29 NOTE — Chronic Care Management (AMB) (Signed)
  Care Management   Note  04/29/2020 Name: [name] [name] MRN: [id number] DOB: 17-Jun-1929  [name] is a 86 y.o. year old female who is a primary care patient of [name], MD and is actively engaged with the care management team. I reached out to [place] by phone today to assist with re-scheduling an initial visit with the RN Case Manager  Follow up plan: Unsuccessful telephone outreach attempt made. A HIPAA compliant phone message was left for the patient providing contact information and requesting a return call.  The care management team will reach out to the patient again over the next 7 days.  If patient returns call to provider office, please advise to call [place] at [place] [facility]

## 2020-04-29 NOTE — Progress Notes (Signed)
Internal Medicine Clinic Resident  I have personally reviewed this encounter including the documentation in this note and/or discussed this patient with the care management provider. I will address any urgent items identified by the care management provider and will communicate my actions to the patient's PCP. I have reviewed the patient's CCM visit with my supervising attending, Dr Vincent.  Matt Larrissa Stivers, MD 04/29/2020   

## 2020-04-30 ENCOUNTER — Ambulatory Visit (INDEPENDENT_AMBULATORY_CARE_PROVIDER_SITE_OTHER): Payer: Medicare Other | Admitting: Physician Assistant

## 2020-04-30 ENCOUNTER — Telehealth: Payer: Self-pay | Admitting: Radiology

## 2020-04-30 ENCOUNTER — Other Ambulatory Visit: Payer: Self-pay

## 2020-04-30 ENCOUNTER — Encounter: Payer: Self-pay | Admitting: Physician Assistant

## 2020-04-30 VITALS — BP 160/84 | HR 70 | Ht 65.0 in | Wt 120.8 lb

## 2020-04-30 DIAGNOSIS — I10 Essential (primary) hypertension: Secondary | ICD-10-CM

## 2020-04-30 DIAGNOSIS — R002 Palpitations: Secondary | ICD-10-CM

## 2020-04-30 DIAGNOSIS — R55 Syncope and collapse: Secondary | ICD-10-CM | POA: Diagnosis not present

## 2020-04-30 NOTE — Progress Notes (Signed)
Internal Medicine Clinic Attending  CCM services provided by the care management provider and their documentation were discussed with Dr. Johnson. We reviewed the pertinent findings, urgent action items addressed by the resident and non-urgent items to be addressed by the PCP.  I agree with the assessment, diagnosis, and plan of care documented in the CCM and resident's note.  Arnold Kester Thomas Niles Ess, MD 04/30/2020  

## 2020-04-30 NOTE — Patient Instructions (Addendum)
Medication Instructions:   Your physician recommends that you continue on your current medications as directed. Please refer to the Current Medication list given to you today.   *If you need a refill on your cardiac medications before your next appointment, please call your pharmacy*   Lab Work: [place]    If you have labs (blood work) drawn today and your tests are completely normal, you will receive your results only by: Marland Kitchen MyChart Message (if you have MyChart) OR . A paper copy in the mail If you have any lab test that is abnormal or we need to change your treatment, we will call you to review the results.   Testing/Procedures: Your physician has recommended that you wear an event monitor. Event monitors are medical devices that record the heart's electrical activity. Doctors most often [place] these monitors to diagnose arrhythmias. Arrhythmias are problems with the speed or rhythm of the heartbeat. The monitor is a small, portable device. You can wear one while you do your normal daily activities. This is usually used to diagnose what is causing palpitations/syncope (passing out). SOMEONE WILL CONTACT YOU FROM DEPARTMENT ABOUT FURTHER STEPS   Follow-Up: At [facility], you and your health needs are our priority.  As part of our continuing mission to provide you with exceptional heart care, we have created designated Provider Care Teams.  These Care Teams include your primary Cardiologist (physician) and Advanced Practice Providers (APPs -  Physician Assistants and Nurse Practitioners) who all work together to provide you with the care you need, when you need it.  We recommend signing up for the patient portal called "MyChart".  Sign up information is provided on this After Visit Summary.  MyChart is used to connect with patients for Virtual Visits (Telemedicine).  Patients are able to view lab/test results, encounter notes, upcoming appointments, etc.  Non-urgent messages can be  sent to your provider as well.   To learn more about what you can do with MyChart, go to [email or web address].    Your next appointment:   4-6 week(s)  The format for your next appointment:   In Person  Provider:   You may see Dr. Lovena Le  or one of the following Advanced Practice Providers on your designated Care Team:    Chanetta Marshall, NP  Tommye Standard, PA-C  Legrand Como "Jonni Sanger" [place], [place]    Other Instructions    ZIO AT Long term monitor-Live Telemetry  Your physician has requested you wear a ZIO patch monitor for 14 _days.  This is a single patch monitor. [employer] supplies one patch monitor per enrollment. Additional stickers are not available.  Please do not apply patch if you will be having a Nuclear Stress Test, Echocardiogram, Cardiac CT, MRI, or Chest Xray during the time frame you would be wearing the monitor. The patch cannot be worn during these tests. You cannot remove and re-apply the ZIO AT patch monitor.   Your ZIO patch monitor will be sent Fed Ex from [employer] directly to your home address. The monitor may also be mailed to a PO BOX if home delivery is not available. It may take 3-5 days to receive your monitor after you have been enrolled.  Once you have received you monitor, please review enclosed instructions. Your monitor has already been registered assigning a specific monitor serial # to you.   Applying the monitor  Shave hair from upper left chest.  Hold abrader disc by orange tab. Rub abrader in 40 strokes  over left upper chest as indicated in your monitor instructions.  Clean area with 4 enclosed alcohol pads. Use all pads to ensure the area is cleaned thoroughly. Let dry.  Apply patch as indicated in monitor instructions. Patch will be placed under collarbone on left side of chest with arrow pointing upward.  Rub patch adhesive wings for 2 minutes. Remove the white label marked "1". Remove the white label marked "2". Rub patch adhesive wings for  2 additional minutes.  While looking in a mirror, press and release button in center of patch. A small green light will flash 3-4 times. This will be your only indicator the monitor has been turned on.  Do not shower for the first 24 hours. You may shower after the first 24 hours.  Press the button if you feel a symptom. You will hear a small click. Record Date, Time and Symptom in the Patient Log.   Starting the Gateway  In your kit there is a Hydrographic surveyor box the size of a cellphone. This is Airline pilot. It transmits all your recorded data to [facility]. This box must stay within 10 feet of you at all times. Open the box and push the * button. There will be a light that blinks orange and then green a few times. When the light stops blinking, the Gateway is connected to the ZIO patch.  Call [employer] at [phone] to confirm your monitor is transmitting.   Returning your monitor  Remove your patch and place it inside the [place]. In the lower half of the Gateway there is a white bag with prepaid postage on it. Place Gateway in bag and seal. Mail package back to [place] as soon as possible. Your physician should have your final report approximately 7 days after you have mailed back your monitor.   Call [place] at [phone] if you have questions regarding your ZIO AT patch monitor. Call them immediately if you see an orange light blinking on your monitor.  If your monitor falls off in less than 4 days contact our Monitor department at [phone]. If your monitor becomes loose or falls off after 4 days call [employer] at [phone] for suggestions on securing your monitor.

## 2020-04-30 NOTE — Telephone Encounter (Signed)
Enrolled patient for a 14 day Zio AT monitor to be mailed to patients home.  

## 2020-05-01 NOTE — Progress Notes (Signed)
Patient scheduled for covid test a day too early.  Called patient and left message to let her know that she will need to come on Saturday instead of tomorrow.  Appointment rescheduled.

## 2020-05-02 ENCOUNTER — Inpatient Hospital Stay (HOSPITAL_COMMUNITY)
Admission: RE | Admit: 2020-05-02 | Discharge: 2020-05-02 | Disposition: A | Discharge status: Home / Self Care | Payer: Medicare Other | Source: Ambulatory Visit

## 2020-05-03 ENCOUNTER — Other Ambulatory Visit (HOSPITAL_COMMUNITY)
Admission: RE | Admit: 2020-05-03 | Discharge: 2020-05-03 | Disposition: A | Discharge status: Home / Self Care | Payer: Medicare Other | Source: Ambulatory Visit | Attending: Gastroenterology | Admitting: Gastroenterology

## 2020-05-03 DIAGNOSIS — Z20822 Contact with and (suspected) exposure to covid-19: Secondary | ICD-10-CM | POA: Insufficient documentation

## 2020-05-03 DIAGNOSIS — Z01812 Encounter for preprocedural laboratory examination: Secondary | ICD-10-CM | POA: Diagnosis not present

## 2020-05-04 LAB — SARS CORONAVIRUS 2 (TAT 6-24 HRS): SARS Coronavirus 2: NEGATIVE

## 2020-05-05 ENCOUNTER — Ambulatory Visit (INDEPENDENT_AMBULATORY_CARE_PROVIDER_SITE_OTHER): Payer: Medicare Other

## 2020-05-05 ENCOUNTER — Ambulatory Visit: Payer: Medicare Other

## 2020-05-05 ENCOUNTER — Telehealth: Payer: Self-pay

## 2020-05-05 ENCOUNTER — Encounter (HOSPITAL_COMMUNITY): Payer: Self-pay | Admitting: Gastroenterology

## 2020-05-05 DIAGNOSIS — R002 Palpitations: Secondary | ICD-10-CM | POA: Diagnosis not present

## 2020-05-05 DIAGNOSIS — E785 Hyperlipidemia, unspecified: Secondary | ICD-10-CM

## 2020-05-05 DIAGNOSIS — I10 Essential (primary) hypertension: Secondary | ICD-10-CM

## 2020-05-05 DIAGNOSIS — R55 Syncope and collapse: Secondary | ICD-10-CM | POA: Diagnosis not present

## 2020-05-05 DIAGNOSIS — R1319 Other dysphagia: Secondary | ICD-10-CM

## 2020-05-05 NOTE — Chronic Care Management (AMB) (Signed)
Chronic Care Management    Clinical Social Work Follow Up Note  05/05/2020 Name: [name] [name] MRN: [id number] DOB: 09-24-1929  [name] [name] is a 86 y.o. year old female who is a primary care patient of [name] Payment, MD. The CCM team was consulted for assistance with PCS.   Review of patient status, including review of consultants reports, other relevant assessments, and collaboration with appropriate care team members and the patient's provider was performed as part of comprehensive patient evaluation and provision of chronic care management services.    SDOH (Social Determinants of Health) assessments performed: No    Outpatient Encounter Medications as of 05/05/2020  Medication Sig  . AMBULATORY NON FORMULARY MEDICATION Peppermint altoids 1-2 tabs as needed  . aspirin EC 325 MG tablet Take 325 mg by mouth daily as needed (headache).  . bismuth subsalicylate (PEPTO BISMOL) 262 MG/15ML suspension Take 30 mLs by mouth as needed.  Marland Kitchen Cod Liver Oil CAPS Take 1 capsule by mouth daily.  . famotidine (PEPCID) 20 MG tablet Take 1 tablet (20 mg total) by mouth 2 (two) times daily.  . flecainide (TAMBOCOR) 50 MG tablet TAKE 2 TABLETS EVERY MORNING AND 1 TABLET AT NIGHT  . Hyoscyamine Sulfate SL (LEVSIN/SL) 0.125 MG SUBL Take 1 to 2 sublingual tablets(under the tongue) as needed for spasms.  . metoprolol succinate (TOPROL XL) 25 MG 24 hr tablet May take 1/2 tablet by mouth daily for breakthrough palpitations.  . nitroGLYCERIN (NITROSTAT) 0.3 MG SL tablet Place 1 tablet (0.3 mg total) under the tongue every 5 (five) minutes as needed (esophageal pain. Do not take any more than 3 doses in 24 hours.).   No facility-administered encounter medications on file as of 05/05/2020.     Goals Addressed              This Visit's Progress   .  "I need help in the home" (pt-stated)        Current Barriers:  Marland Kitchen Knowledge Barriers related to resources and support available to address needs related to ADL  IADL limitations  Patient seeking PCS but cannot afford private pay.  Patient is unsure of Medicaid status.  Patient lives alone but states that her son lives next door and checks on her multiple times a day.  . 05/05/20.  Patient states that she has left two voicemail messages with [facility] but has not received return call to provided selected agencies.    Case Manager Clinical Goal(s):  Marland Kitchen Over the next 60 days, patient will work with BSW to address needs related to ADL IADL limitations/ obtaining PCS . Over the next 60 days, BSW will collaborate with RN Care Manager to address care management and care coordination needs  Interventions:  . [place] regarding status of PCS.  Per representative, patient has not yet selected PCS agency . Provided patient with alternate contact number for [facility] and encouraged her to call to select service agencies.    . Patient Self Care Activities:  . Self administers medications as prescribed . Attends all scheduled provider appointments . Calls provider office for new concerns or questions  Please see past updates related to this goal by clicking on the "Past Updates" button in the selected goal          Follow Up Plan: SW will follow up with patient by phone over the next 7-10 days     [place], [place]  [phone]                             

## 2020-05-05 NOTE — Progress Notes (Signed)
Pre procedure Endo call complete.  Pt states she fell and had to have stitches recently, states she "passed out."  Also states she is consuming very little (and only liquid or puree) and having diarrhea which she has called her primary MD about. Concerned about need to have her potassium rechecked, suggested she have it done if she sees her PCP today.  Also she was due to start wearing [place] Monitor. Spoke to her son Delfino Lovett, who will be her driver, and suggested he retrieve a wheelchair for her upon arrival to admissions.

## 2020-05-05 NOTE — Telephone Encounter (Signed)
Returned call rang and rang, no answer, no vmail

## 2020-05-05 NOTE — Patient Instructions (Signed)
Licensed Clinical Social Worker Visit Information  Goals we discussed today:  Goals Addressed              This Visit's Progress   .  "I need help in the home" (pt-stated)        Current Barriers:  Marland Kitchen Knowledge Barriers related to resources and support available to address needs related to ADL IADL limitations  Patient seeking PCS but cannot afford private pay.  Patient is unsure of Medicaid status.  Patient lives alone but states that her son lives next door and checks on her multiple times a day.  . 05/05/20.  Patient states that she has left two voicemail messages with [facility] but has not received return call to provided selected agencies.    Case Manager Clinical Goal(s):  Marland Kitchen Over the next 60 days, patient will work with BSW to address needs related to ADL IADL limitations/ obtaining PCS . Over the next 60 days, BSW will collaborate with RN Care Manager to address care management and care coordination needs  Interventions:  . [place] regarding status of PCS.  Per representative, patient has not yet selected PCS agency . Provided patient with alternate contact number for [facility] and encouraged her to call to select service agencies.    . Patient Self Care Activities:  . Self administers medications as prescribed . Attends all scheduled provider appointments . Calls provider office for new concerns or questions  Please see past updates related to this goal by clicking on the "Past Updates" button in the selected goal            Follow Up Plan: SW will follow up with patient by phone over the next 7-10 days      [place], [place] Worker [place] [phone]

## 2020-05-05 NOTE — Telephone Encounter (Signed)
Pt would like something called in, pt has diarrhea [phone]

## 2020-05-06 DIAGNOSIS — R002 Palpitations: Secondary | ICD-10-CM | POA: Diagnosis not present

## 2020-05-06 DIAGNOSIS — R55 Syncope and collapse: Secondary | ICD-10-CM | POA: Diagnosis not present

## 2020-05-06 NOTE — Chronic Care Management (AMB) (Signed)
  Care Management   Note  05/06/2020 Name: [name] [name] MRN: [id number] DOB: 08-18-1929  [name] [name] is a 86 y.o. year old female who is a primary care patient of [name], MD and is actively engaged with the care management team. I reached out to [place] by phone today to assist with re-scheduling an initial visit with the RN Case Manager.  Follow up plan: Telephone appointment with care management team member scheduled for:05/13/2020  [place] Management

## 2020-05-06 NOTE — Progress Notes (Signed)
Internal Medicine Clinic Resident  I have personally reviewed this encounter including the documentation in this note and/or discussed this patient with the care management provider. I will address any urgent items identified by the care management provider and will communicate my actions to the patient's PCP. I have reviewed the patient's CCM visit with my supervising attending, Dr Rebeca Alert.  Mitzi Hansen, MD Internal Medicine Resident PGY-2 [facility] Pager: [phone] 05/06/2020 4:03 PM

## 2020-05-07 ENCOUNTER — Encounter (HOSPITAL_COMMUNITY)
Admission: RE | Disposition: A | Discharge status: Home / Self Care | Payer: Self-pay | Source: Home / Self Care | Attending: Gastroenterology

## 2020-05-07 ENCOUNTER — Ambulatory Visit (HOSPITAL_COMMUNITY)
Admission: RE | Admit: 2020-05-07 | Discharge: 2020-05-07 | Disposition: A | Discharge status: Home / Self Care | Payer: Medicare Other | Attending: Gastroenterology | Admitting: Gastroenterology

## 2020-05-07 DIAGNOSIS — R1013 Epigastric pain: Secondary | ICD-10-CM | POA: Diagnosis not present

## 2020-05-07 DIAGNOSIS — R0789 Other chest pain: Secondary | ICD-10-CM | POA: Diagnosis not present

## 2020-05-07 DIAGNOSIS — R1310 Dysphagia, unspecified: Secondary | ICD-10-CM | POA: Diagnosis not present

## 2020-05-07 DIAGNOSIS — G8929 Other chronic pain: Secondary | ICD-10-CM | POA: Diagnosis not present

## 2020-05-07 DIAGNOSIS — K224 Dyskinesia of esophagus: Secondary | ICD-10-CM | POA: Diagnosis not present

## 2020-05-07 HISTORY — PX: ESOPHAGEAL MANOMETRY: SHX5429

## 2020-05-07 SURGERY — MANOMETRY, ESOPHAGUS
Anesthesia: Moderate Sedation

## 2020-05-07 MED ORDER — LIDOCAINE VISCOUS HCL 2 % MT SOLN
OROMUCOSAL | Status: AC
  Filled 2020-05-07: qty 15

## 2020-05-07 SURGICAL SUPPLY — 2 items
FACESHIELD LNG OPTICON STERILE (SAFETY) IMPLANT
GLOVE BIO SURGEON STRL SZ8 (GLOVE) ×4 IMPLANT

## 2020-05-07 NOTE — Progress Notes (Signed)
Internal Medicine Clinic Attending  CCM services provided by the care management provider and their documentation were reviewed with Dr. Christian.  We reviewed the pertinent findings, urgent action items addressed by the resident and non-urgent items to be addressed by the PCP.  I agree with the assessment, diagnosis, and plan of care documented in the CCM and resident's note.  Kerin Cecchi N Dorcus Riga, MD 05/07/2020  

## 2020-05-07 NOTE — Progress Notes (Signed)
Esophageal manometry performed per protocol.  Patient tolerated well.  Report to be sent to Dr. Kavitha Nandigam 

## 2020-05-08 NOTE — Progress Notes (Signed)
Internal Medicine Clinic Attending  CCM services provided by the care management provider and their documentation were reviewed with Dr. Agyei.  We reviewed the pertinent findings, urgent action items addressed by the resident and non-urgent items to be addressed by the PCP.  I agree with the assessment, diagnosis, and plan of care documented in the CCM and resident's note.  Seairra Otani N Jasai Sorg, MD 05/08/2020  

## 2020-05-09 ENCOUNTER — Encounter (HOSPITAL_COMMUNITY): Payer: Self-pay | Admitting: Gastroenterology

## 2020-05-12 ENCOUNTER — Ambulatory Visit: Payer: Medicare Other

## 2020-05-12 DIAGNOSIS — E785 Hyperlipidemia, unspecified: Secondary | ICD-10-CM

## 2020-05-12 DIAGNOSIS — R1319 Other dysphagia: Secondary | ICD-10-CM

## 2020-05-12 DIAGNOSIS — I10 Essential (primary) hypertension: Secondary | ICD-10-CM

## 2020-05-12 NOTE — Patient Instructions (Signed)
Licensed Clinical Social Worker Visit Information  Goals we discussed today:  Goals Addressed              This Visit's Progress   .  "I need help in the home" (pt-stated)        Current Barriers:  Marland Kitchen Knowledge Barriers related to resources and support available to address needs related to ADL IADL limitations  Patient seeking PCS but cannot afford private pay.  Patient is unsure of Medicaid status.  Patient lives alone but states that her son lives next door and checks on her multiple times a day.  . 05/05/20.  Patient states that she has left two voicemail messages with [facility] but has not received return call to provided selected agencies.   . 05/12/20:  Per [employer], patient was not approved for [employer]  Case Manager Clinical Goal(s):  Marland Kitchen Over the next 60 days, patient will work with BSW to address needs related to ADL IADL limitations/ obtaining PCS . Over the next 60 days, BSW will collaborate with RN Care Manager to address care management and care coordination needs  Interventions:  . [place] regarding status of PCS.   Marland Kitchen Attempted to follow up with patient as it seems she is under the impression she has been approved for services.   . Patient Self Care Activities:  . Self administers medications as prescribed . Attends all scheduled provider appointments . Calls provider office for new concerns or questions  Please see past updates related to this goal by clicking on the "Past Updates" button in the selected goal          Follow Up Plan: Attempted to follow up with patient today via phone but had to leave message.  SW will attempt to reach patient again over the next 7 days if no return call.       Ronn Melena, [place] Coordination Social Worker [place] [phone]

## 2020-05-12 NOTE — Progress Notes (Signed)
Internal Medicine Clinic Resident  I have personally reviewed this encounter including the documentation in this note and/or discussed this patient with the care management provider. I will address any urgent items identified by the care management provider and will communicate my actions to the patient's PCP. I have reviewed the patient's CCM visit with my supervising attending, Dr Rebeca Alert.  Jean Rosenthal, MD 05/12/2020

## 2020-05-12 NOTE — Chronic Care Management (AMB) (Signed)
Chronic Care Management    Clinical Social Work Follow Up Note  05/12/2020 Name: [name] [name] MRN: [id number] DOB: 11-17-1929  Odella Aquas Horkey is a 86 y.o. year old female who is a primary care patient of [name], MD. The CCM team was consulted for assistance with Level of Care Concerns.   Review of patient status, including review of consultants reports, other relevant assessments, and collaboration with appropriate care team members and the patient's provider was performed as part of comprehensive patient evaluation and provision of chronic care management services.    SDOH (Social Determinants of Health) assessments performed: No    Outpatient Encounter Medications as of 05/12/2020  Medication Sig  . AMBULATORY NON FORMULARY MEDICATION Peppermint altoids 1-2 tabs as needed  . aspirin EC 325 MG tablet Take 325 mg by mouth daily as needed (headache).  . bismuth subsalicylate (PEPTO BISMOL) 262 MG/15ML suspension Take 30 mLs by mouth as needed.  Marland Kitchen Cod Liver Oil CAPS Take 1 capsule by mouth daily.  . famotidine (PEPCID) 20 MG tablet Take 1 tablet (20 mg total) by mouth 2 (two) times daily.  . flecainide (TAMBOCOR) 50 MG tablet TAKE 2 TABLETS EVERY MORNING AND 1 TABLET AT NIGHT  . Hyoscyamine Sulfate SL (LEVSIN/SL) 0.125 MG SUBL Take 1 to 2 sublingual tablets(under the tongue) as needed for spasms.  . metoprolol succinate (TOPROL XL) 25 MG 24 hr tablet May take 1/2 tablet by mouth daily for breakthrough palpitations.  . nitroGLYCERIN (NITROSTAT) 0.3 MG SL tablet Place 1 tablet (0.3 mg total) under the tongue every 5 (five) minutes as needed (esophageal pain. Do not take any more than 3 doses in 24 hours.).   No facility-administered encounter medications on file as of 05/12/2020.     Goals Addressed              This Visit's Progress   .  "I need help in the home" (pt-stated)        Current Barriers:  Marland Kitchen Knowledge Barriers related to resources and support available to address  needs related to ADL IADL limitations  Patient seeking PCS but cannot afford private pay.  Patient is unsure of Medicaid status.  Patient lives alone but states that her son lives next door and checks on her multiple times a day.  . 05/05/20.  Patient states that she has left two voicemail messages with [facility] but has not received return call to provided selected agencies.   . 05/12/20:  Per [employer], patient was not approved for [employer]  Case Manager Clinical Goal(s):  Marland Kitchen Over the next 60 days, patient will work with BSW to address needs related to ADL IADL limitations/ obtaining PCS . Over the next 60 days, BSW will collaborate with RN Care Manager to address care management and care coordination needs  Interventions:  . [place] regarding status of PCS.   Marland Kitchen Attempted to follow up with patient as it seems she is under the impression she has been approved for services.   . Patient Self Care Activities:  . Self administers medications as prescribed . Attends all scheduled provider appointments . Calls provider office for new concerns or questions  Please see past updates related to this goal by clicking on the "Past Updates" button in the selected goal           Follow Up Plan: Attempted to follow up with patient today via phone but had to leave message.  SW will attempt to  reach patient again over the next 7 days if no return call.       Ronn Melena, [place] Coordination Social Worker [place] [phone]

## 2020-05-13 ENCOUNTER — Ambulatory Visit: Payer: Medicare Other | Admitting: *Deleted

## 2020-05-13 DIAGNOSIS — E785 Hyperlipidemia, unspecified: Secondary | ICD-10-CM

## 2020-05-13 DIAGNOSIS — I10 Essential (primary) hypertension: Secondary | ICD-10-CM

## 2020-05-13 DIAGNOSIS — R1319 Other dysphagia: Secondary | ICD-10-CM

## 2020-05-13 NOTE — Chronic Care Management (AMB) (Signed)
  Chronic Care Management   Note  05/13/2020 Name: [name] [name] MRN: [id number] DOB: February 16, 1930  Long discussion with patient about Chronic Care Management Program and role of CCM RN. Extensive health history provided by patient with all issues related to her symptoms associated with esophageal motility and current treatment of small amounts of "blender food" which has significantly improved the symptoms and stopped the unintentional weight loss of >30 lbs.     Follow up plan: No CCM RN needs identified at this time. Advised patient this CCM RN will attempt to meet with her when she comes for a clinic follow up to address hypokalemia to finalize need for CCM RN involvement.Kelli Churn RN, CCM, [place] [facility] RN Care Manager [phone]

## 2020-05-13 NOTE — Progress Notes (Signed)
Internal Medicine Clinic Resident  I have personally reviewed this encounter including the documentation in this note and/or discussed this patient with the care management provider. I will address any urgent items identified by the care management provider and will communicate my actions to the patient's PCP. I have reviewed the patient's CCM visit with my supervising attending, Dr Dareen Piano.  Jean Rosenthal, MD 05/13/2020

## 2020-05-13 NOTE — Progress Notes (Signed)
Internal Medicine Clinic Attending  CCM services provided by the care management provider and their documentation were reviewed with Dr. Eileen Stanford.  We reviewed the pertinent findings, urgent action items addressed by the resident and non-urgent items to be addressed by the PCP.  I agree with the assessment, diagnosis, and plan of care documented in the CCM and resident's note.  Oda Kilts, MD 05/13/2020

## 2020-05-14 ENCOUNTER — Ambulatory Visit: Payer: Medicare Other

## 2020-05-14 DIAGNOSIS — E785 Hyperlipidemia, unspecified: Secondary | ICD-10-CM

## 2020-05-14 DIAGNOSIS — R1319 Other dysphagia: Secondary | ICD-10-CM

## 2020-05-14 DIAGNOSIS — I10 Essential (primary) hypertension: Secondary | ICD-10-CM

## 2020-05-14 NOTE — Chronic Care Management (AMB) (Signed)
Chronic Care Management    Clinical Social Work Follow Up Note  05/14/2020 Name: [name] [name] MRN: [id number] DOB: 07-Nov-1929  [name] [name] is a 86 y.o. year old female who is a primary care patient of [name] Payment, MD. The CCM team was consulted for assistance with Level of Care Concerns.   Review of patient status, including review of consultants reports, other relevant assessments, and collaboration with appropriate care team members and the patient's provider was performed as part of comprehensive patient evaluation and provision of chronic care management services.    SDOH (Social Determinants of Health) assessments performed: No    Outpatient Encounter Medications as of 05/14/2020  Medication Sig Note  . AMBULATORY NON FORMULARY MEDICATION Peppermint altoids 1-2 tabs as needed (Patient not taking: Reported on 05/13/2020)   . aspirin EC 325 MG tablet Take 325 mg by mouth daily as needed (headache).   . bismuth subsalicylate (PEPTO BISMOL) 262 MG/15ML suspension Take 30 mLs by mouth as needed. (Patient not taking: Reported on 05/13/2020)   . Cod Liver Oil CAPS Take 1 capsule by mouth daily. (Patient not taking: Reported on 05/13/2020) 05/13/2020: The pill is too big so she stopped   . famotidine (PEPCID) 20 MG tablet Take 1 tablet (20 mg total) by mouth 2 (two) times daily. (Patient not taking: Reported on 05/13/2020)   . flecainide (TAMBOCOR) 50 MG tablet TAKE 2 TABLETS EVERY MORNING AND 1 TABLET AT NIGHT   . Hyoscyamine Sulfate SL (LEVSIN/SL) 0.125 MG SUBL Take 1 to 2 sublingual tablets(under the tongue) as needed for spasms. 05/13/2020: Has not needed in weeks  . LORazepam (ATIVAN) 0.5 MG tablet Take by mouth. (Patient not taking: Reported on 05/13/2020)   . metoprolol succinate (TOPROL XL) 25 MG 24 hr tablet May take 1/2 tablet by mouth daily for breakthrough palpitations. (Patient not taking: Reported on 05/13/2020)   . nitroGLYCERIN (NITROSTAT) 0.3 MG SL tablet Place 1 tablet (0.3  mg total) under the tongue every 5 (five) minutes as needed (esophageal pain. Do not take any more than 3 doses in 24 hours.). (Patient not taking: Reported on 05/13/2020)    No facility-administered encounter medications on file as of 05/14/2020.     Goals Addressed              This Visit's Progress   .  COMPLETED: "I need help in the home" (pt-stated)        Current Barriers:  Marland Kitchen Knowledge Barriers related to resources and support available to address needs related to ADL IADL limitations  Patient seeking PCS but cannot afford private pay.  Patient is unsure of Medicaid status.  Patient lives alone but states that her son lives next door and checks on her multiple times a day.  . 05/05/20.  Patient states that she has left two voicemail messages with [facility] but has not received return call to provided selected agencies.   . 05/12/20:  Per [employer], patient was not approved for [employer] . 05/14/20:  Patient was not approved for PCS and states today that she did not anticipate she would be approved.  Patient and her son have submitted necessary documentation to apply for services through the [place].  Patient states that she has not yet heard back from [place] representative and she intends to call her at the beginning of the year if she does not hear from her before then.  Case Manager Clinical Goal(s):  Marland Kitchen Over the next 60 days, patient will  work with BSW to address needs related to ADL IADL limitations/ obtaining PCS . Over the next 60 days, BSW will collaborate with RN Care Manager to address care management and care coordination needs  Interventions:  . Contacted patient for follow up on need for in-home aide services  . Informed patient that CCM BSW last day at [facility] is 05/21/20 . Closing goal of care plan as patient/son have submitted documentation to apply for in-home aide services through the [place]  . Patient Self Care Activities:  . Self administers medications  as prescribed . Attends all scheduled provider appointments . Calls provider office for new concerns or questions  Please see past updates related to this goal by clicking on the "Past Updates" button in the selected goal      .  "I told the social worker  I need help in the home" (pt-stated)         Current Barriers:  . Chronic Disease Management support, education, and care coordination needs related to HTN ,Fall Risk and Osteoporosis . 05/05/20.  Patient states that she has left two voicemail messages with [facility] but has not received return call to provided selected agencies.   . 05/12/20:  Per [employer], patient was not approved for [employer] . 05/14/20:  Patient was not approved for PCS and states today that she did not anticipate she would be approved.  Patient and her son have submitted necessary documentation to apply for services through the [place].  Patient states that she has not yet heard back from [place] representative and she intends to call her at the beginning of the year if she does not hear from her before then. Case Manager Clinical Goal(s):  Marland Kitchen Over the next 60 days, patient will work with BSW to address needs related to Financial constraints related to need for help in the home. . ADL IADL limitations in patient with HTN, Fall Risk and Osteoporosis   Interventions:  . Contacted patient for follow up on need for in-home aide services  . Informed patient that CCM BSW last day at [facility] is 05/21/20 . Closing goal of care plan as patient/son have submitted documentation to apply for in-home aide services through the [place] Patient Self Care Activities:  . Self administers medications as prescribed . Attends all scheduled provider appointments . Calls pharmacy for medication refills . Calls provider office for new concerns or questions  Please see past updates related to this goal by clicking on the "Past Updates" button in the selected goal          No  additional SW follow up at this time.  RNCM scheduled to meet with patient during next office visit on 05/21/20.     Malachy Chamber, BSW Embedded Care Coordination Social Worker [facility] [phone]

## 2020-05-14 NOTE — Progress Notes (Signed)
Internal Medicine Clinic Resident  I have personally reviewed this encounter including the documentation in this note and/or discussed this patient with the care management provider. I will address any urgent items identified by the care management provider and will communicate my actions to the patient's PCP. I have reviewed the patient's CCM visit with my supervising attending, Dr Dareen Piano.  Gaylan Gerold, DO 05/14/2020

## 2020-05-14 NOTE — Patient Instructions (Signed)
Licensed Clinical Social Worker Visit Information  Goals we discussed today:  Goals Addressed              This Visit's Progress   .  COMPLETED: "I need help in the home" (pt-stated)        Current Barriers:  Marland Kitchen Knowledge Barriers related to resources and support available to address needs related to ADL IADL limitations  Patient seeking PCS but cannot afford private pay.  Patient is unsure of Medicaid status.  Patient lives alone but states that her son lives next door and checks on her multiple times a day.  . 05/05/20.  Patient states that she has left two voicemail messages with [facility] but has not received return call to provided selected agencies.   . 05/12/20:  Per [employer], patient was not approved for [employer] . 05/14/20:  Patient was not approved for PCS and states today that she did not anticipate she would be approved.  Patient and her son have submitted necessary documentation to apply for services through the [place].  Patient states that she has not yet heard back from [place] representative and she intends to call her at the beginning of the year if she does not hear from her before then.  Case Manager Clinical Goal(s):  Marland Kitchen Over the next 60 days, patient will work with BSW to address needs related to ADL IADL limitations/ obtaining PCS . Over the next 60 days, BSW will collaborate with RN Care Manager to address care management and care coordination needs  Interventions:  . Contacted patient for follow up on need for in-home aide services  . Informed patient that CCM [facility]SW last [facility] is [facility]/21/20 . Closing goal of care plan as patient/son have submitted documentation to apply for in-home aide services through the [place]  . Patient Self Care Activities:  . Self administers medications as prescribed . Attends all scheduled provider appointments . Calls provider office for new concerns or questions  Please see past updates related to this goal by clicking  on the "Past Updates" button in the selected goal      .  "I told the social worker  I need help in the home" (pt-stated)         Current Barriers:  . Chronic Disease Management support, education, and care coordination needs related to HTN ,Fall Risk and Osteoporosis . 05/05/20.  Patient states that she has left two voicemail messages with [facility] but has not received return call to provided selected agencies.   . 05/12/20:  Per [employer], patient was not approved for [employer] . 05/14/20:  Patient was not approved for PCS and states today that she did not anticipate she would be approved.  Patient and her son have submitted necessary documentation to apply for services through the [place].  Patient states that she has not yet heard back from [place] representative and she intends to call her at the beginning of the year if she does not hear from her before then. Case Manager Clinical Goal(s):  Marland Kitchen Over the next 60 days, patient will work with BSW to address needs related to Financial constraints related to need for help in the home. . ADL IADL limitations in patient with HTN, Fall Risk and Osteoporosis   Interventions:  . Contacted patient for follow up on need for in-home aide services  . Informed patient that CCM BSW last day at [facility] is 05/21/20 . Closing goal of care plan as  patient/son have submitted documentation to apply for in-home aide services through the [place] Patient Self Care Activities:  . Self administers medications as prescribed . Attends all scheduled provider appointments . Calls pharmacy for medication refills . Calls provider office for new concerns or questions  Please see past updates related to this goal by clicking on the "Past Updates" button in the selected goal          No additional SW follow up at this time.  RNCM scheduled to meet with patient during next office visit on 05/21/20.     Ronn Melena, [place] Coordination Social  Worker [place] [phone]

## 2020-05-16 DIAGNOSIS — K224 Dyskinesia of esophagus: Secondary | ICD-10-CM

## 2020-05-16 DIAGNOSIS — G8929 Other chronic pain: Secondary | ICD-10-CM

## 2020-05-21 ENCOUNTER — Encounter: Payer: Medicare Other | Admitting: Internal Medicine

## 2020-05-21 ENCOUNTER — Ambulatory Visit: Payer: Medicare Other | Admitting: *Deleted

## 2020-05-21 NOTE — Progress Notes (Signed)
Internal Medicine Clinic Attending  CCM services provided by the care management provider and their documentation were discussed with Dr. Nguyen. We reviewed the pertinent findings, urgent action items addressed by the resident and non-urgent items to be addressed by the PCP.  I agree with the assessment, diagnosis, and plan of care documented in the CCM and resident's note.  Owen Pratte, MD 05/21/2020  

## 2020-05-21 NOTE — Progress Notes (Signed)
Internal Medicine Clinic Attending  CCM services provided by the care management provider and their documentation were discussed with Dr. Agyei. We reviewed the pertinent findings, urgent action items addressed by the resident and non-urgent items to be addressed by the PCP.  I agree with the assessment, diagnosis, and plan of care documented in the CCM and resident's note.  Shamona Wirtz, MD 05/21/2020  

## 2020-05-21 NOTE — Chronic Care Management (AMB) (Signed)
  Chronic Care Management   Note  05/21/2020 Name: [name] [name] MRN: [id number] DOB: 06/30/1929  Patient cancelled clinic appointment that was scheduled for today. This RNCM had planned to meet her during the appointment to advise her if CCM RN services needed in the future she can contact this CCM RN or her clinic provider.  Follow up plan: No further follow up required: no CCM RN needs identified.  Cranford Mon RN, CCM, CDCES CCM Clinic RN Care Manager [phone]

## 2020-05-30 ENCOUNTER — Telehealth: Payer: Self-pay | Admitting: Gastroenterology

## 2020-05-30 ENCOUNTER — Telehealth: Payer: Self-pay | Admitting: Internal Medicine

## 2020-05-30 NOTE — Telephone Encounter (Signed)
Patient c/o Palpitations:  High priority if patient c/o lightheadedness, shortness of breath, or chest pain  1) How long have you had palpitations/irregular HR/ Afib? Are you having the symptoms now? Started this morning around 8:00 am is having symptoms now   2) Are you currently experiencing lightheadedness, SOB or CP? No   3) Do you have a history of afib (atrial fibrillation) or irregular heart rhythm? Yes   4) Have you checked your BP or HR? (document readings if available): 169/114 this morning   5) Are you experiencing any other symptoms? No   States she is very miserable. Please advise.

## 2020-05-30 NOTE — Telephone Encounter (Signed)
Inbound call from patient asking if results for esophageal manometry are in and requesting a call back please.

## 2020-05-30 NOTE — Telephone Encounter (Addendum)
Pt reports palpitations on and off since Tuesday.  States they are harder/stronger than usual and are taking a long time to stop. She is currently w/o palpitations and laying down - states they stopped about 15 minutes ago.  Reports her esophagus is "giving me a fit this morning". She took 2 of her "stomach pills" but this hasn't helped. She took 12.5 PRN Toprol yesterday, took a whole tablet this morning. On pt med list she can take Toprol 12.5 mg PRN for palpitations... BP this morning 171/143, hr 67  171/122, hr 65  At noon: 138/80, hr 62 She reports having to take deep breaths sometimes. Gained 5 pds over last week.  Weighed 116 last week, 121 this morning. Some finger swelling but this currently resolved "after exercising". Dizzy some this week.  Pt aware that I will forward to Dr. Tanna Furry nurse to review with him and advise. Pt recently wore monitor and result may be in [place] for review prior to making decision/recommendaton Aware office will call back with MD advisement... Pt agreeable to plan.

## 2020-05-30 NOTE — Telephone Encounter (Signed)
Dr Ardis Hughs have you reviewed the esophageal manometry? I see that it is in Epic.

## 2020-05-30 NOTE — Telephone Encounter (Signed)
Dr. Lovena Le aware.  Will read heart monitor as able.  Pt has follow up scheduled for 06/04/20 with RU.

## 2020-06-02 ENCOUNTER — Other Ambulatory Visit: Payer: Self-pay | Admitting: Internal Medicine

## 2020-06-02 MED ORDER — NIFEDIPINE 10 MG PO CAPS: 10.0000 mg | ORAL_CAPSULE | Freq: Three times a day (TID) | ORAL | 3 refills | Status: DC

## 2020-06-02 NOTE — Progress Notes (Signed)
Cardiology Office Note Date:  06/02/2020  Patient ID:  [name] [name], DOB 02/06/1930, MRN [id number] PCP:  Dellia Cloud, MD Electrophysiologist: Dr. Ladona Ridgel                                                                                                      Chief Complaint:   Ongoing palpitations  History of Present Illness: [name] [name] is a 87 y.o. female with history of palpitations (PSVT is mentioned in her notes, though Dr. Lubertha Basque note state no sustained arrhythmias and APC's treated with Flecainide), HTN.    Historically had been maintained on flecainide for years for symptomatic PACs/PVCs though during a hospital stay CP stopped and started on metoprolol that she was very intolerant of,  and Rx Rythmol but never started. At an office visit with myself in November 2017, noted with PVCs that she was feeling/very bothersome to her, and given negative stress test resumed on her flecainide.    She was seen in f/u by Dr. Ladona Ridgel in Dec 2017 doing better with minimal palpitations, discussed adding metoprolol if more palpitations.  She saw Dr. Ladona Ridgel in Sept 2017 for f/u, she was concerned/convinced she had drank a bottle of contaminated water and was having GI issues, her BP was elevated though thought a component of anxiety, doesn't look like she started the metoprolol, advised to minimize sodium, no symptoms of palpitations.  She was admitted to [facility] with c/o palpitations, noted to have elevated Trop, cath was done without CAD without clear reason for her trop, discharged 05/26/17 to f/u with EP for symptomatic PVCs  I saw her march 2021 She comes today with a few complaints.  Primarily GI, generalized abdominal discomfort, nausea, poor appetite ongoing for some months.  She has seen her PMD for this.  She mentions that she is also suffering with post nasal gtt, hoarse voice, and a funny feeling that seems to come up into her throat from the middle of her chest, makes her feel nauseous.she has been  drinking Ensures every day, poor intake otherwise.  She tried to eat better yesterday but seemed to make her heart skip more then usual No SOB, no CP, no near syncope or syncope. She generally finds her BP at home 110/60's, intermittently spikes higher She has been having difficulty with her grieving process and adjusting to life without her husband.  Though of late has been out in the yard getting some gardening going, more active and states she really felt good physically and even emotionally.  She is looking forward to the better weather/climate to be outside. Symptoms were not felt cardiac, no worrisome EKG changes, no CAD by cath on 2 years prior. Recommended to f/u with her PMD.  She saw Dr. Ladona Ridgel July 2021, no changes were made, mentioned an ER visit with abd complaints, left AMA 2/2 prolonged wait.  04/21/2020 phone note with c/o palpitations, Dr. Ladona Ridgel recommended addition of PRN Toprol  PMD visit 12/2/1 mentions esophageal spasms, palpitations, to continue metoprolol 12.5mg  daily  I saw her 04/30/20 She is a little hard  to follow today, skips around on different topics She c/w GI and reports following closely, planned for another esophageal dilation in the next week or two.  She has lost quite a bit of weight. "I am a shell of my former self" and just can not eat, whenever she eats she gets terrible pain, She is eating pureed food only now and thankfully this is better and has started to be able to eat a little better, she was started on PRN s/l Hycosimine and this has been helpful at times. NO CP, denies SOB She had a questionable fainting episode in Oct, says she stood up to turn off the TV and felt dizzy and thinks she blacked out for a second, when she came to she was actively falling but unable to stop herself and hit her head on the floor, required stitches.  In review of the ER note, reports that she did not have a syncopal event. Generally feels weak and poorly, and atrriibutes  the fall/faint and most of her symptoms to poor eating/intake. She had an episode of palpitations about 7-10 days ago, started "right as my flecainide pill hit my stomach", lasted hours, she wanted to try a second pill but though maybe the pill she had just taken cause it. She was advised via RN that Dr. Ladona Ridgel recommended she yse PRN metoprolol. She saw her PMD recently and her BP on the higher side and recommended discussing with [place] taking the metoprolol daily.  She does not want to do that, weary that her BP at time is already "really low". She brings a log of BPs/HRs, generally are OK   Her BP predominantly 120's-140's/70's-80's, some a bit higher, rarely noted markedly high reading HRs 70's usually, none <60 or as high as 90 We discussed daily use of metoprolol though despite much conversation she was not agreeable with concerns of making her BP too low.  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 Christella Hartigan, started Nifedipine 10mg  TID  TODAY She has ongoing palpitations that are driving her crazy, she does not though think they are her esophagus, but wanted to come to toady's visit prior to starting the nifedipine. She has been using Toprol daily for the last 3 days or so. Np CP, no near syncope or syncope.  No SOB. She says her PMD started her back on Ativan a month or so ago and thinks this may have helped some with her nerves, and shaky feeling.  She again mentions that she just can not seem to eat  much, though is trying concerned about her ongoing weight loss.  (up 2 lbs from her last visit).  Past Medical History:  Diagnosis Date  . Anemia    iron def  . Anxiety   . Arrhythmia    paroxysmal SVT and symtomatic PAC's  . Diverticulosis    colon  . Elevated troponin   . GERD 04/02/2008   Qualifier: Diagnosis of  By: Jonny Ruiz MD, Len Blalock   . History of colonic polyps    cecal  . Hypertension   .  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 Osteoporosis 07/21/2011   t score -2.7  . Palpitations 05/24/2017  . Stricture esophagus   . Vitamin D deficiency    2010    Past Surgical History:  Procedure Laterality Date  . A&P, enterocele repair     s/p 2003  . ABDOMINAL HYSTERECTOMY    . BIOPSY  02/05/2020   Procedure: BIOPSY;  Surgeon: Tressia Danas, MD;  Location: [facility]COPY;  Service: Gastroenterology;;  . BREAST REDUCTION SURGERY     s/p 1996      PATIENT DENIES BREAST REDUCTION   . ESOPHAGEAL DILATION  02/05/2020   Procedure: ESOPHAGEAL DILATION;  Surgeon: Tressia Danas, MD;  Location: [facility] ENDOSCOPY;  Service: Gastroenterology;;  . ESOPHAGEAL MANOMETRY N/A 05/07/2020   Procedure: ESOPHAGEAL MANOMETRY (EM);  Surgeon: Rachael Fee, MD;  Location: [facility] ENDOSCOPY;  Service: Endoscopy;  Laterality: N/A;  . ESOPHAGOGASTRODUODENOSCOPY (EGD) WITH PROPOFOL N/A 02/05/2020   Procedure: ESOPHAGOGASTRODUODENOSCOPY (EGD) WITH PROPOFOL;  Surgeon: Tressia Danas, MD;  Location: [facility] ENDOSCOPY;  Service: Gastroenterology;  Laterality: N/A;  . HEMORRHOID SURGERY    . LEFT HEART CATH AND CORONARY ANGIOGRAPHY N/A 05/25/2017   Procedure: LEFT HEART CATH AND CORONARY ANGIOGRAPHY;  Surgeon: Corky Crafts, MD;  Location: [facility] INVASIVE CV LAB;  Service: Cardiovascular;  Laterality: N/A;    Current Outpatient Medications  Medication Sig Dispense Refill  . AMBULATORY NON FORMULARY MEDICATION Peppermint altoids 1-2 tabs as needed (Patient  not taking: Reported on 05/13/2020)    . aspirin EC 325 MG tablet Take 325 mg by mouth daily as needed (headache).    . bismuth subsalicylate (PEPTO BISMOL) 262 MG/15ML suspension Take 30 mLs by mouth as needed. (Patient not taking: Reported on 05/13/2020)    . Cod Liver Oil CAPS Take 1 capsule by mouth daily. (Patient not taking: Reported on 05/13/2020)    . famotidine (PEPCID) 20 MG tablet Take 1 tablet (20 mg total) by mouth 2 (two) times daily. (Patient not taking: Reported on 05/13/2020) 90 tablet 1  . flecainide (TAMBOCOR) 50 MG tablet TAKE 2 TABLETS EVERY MORNING AND 1 TABLET AT NIGHT 270 tablet 3  . Hyoscyamine Sulfate SL (LEVSIN/SL) 0.125 MG SUBL Take 1 to 2 sublingual tablets(under the tongue) as needed for spasms. 60 tablet 3  . LORazepam (ATIVAN) 0.5 MG tablet Take by mouth. (Patient not taking: Reported on 05/13/2020)    . metoprolol succinate (TOPROL XL) 25 MG 24 hr tablet May take 1/2 tablet by mouth daily for breakthrough palpitations. (Patient not taking: Reported on 05/13/2020) 45 tablet 3  . NIFEdipine (PROCARDIA) 10 MG capsule Take 1 capsule (10 mg total) by mouth 3 (three) times daily. 90 capsule 3  . nitroGLYCERIN (NITROSTAT) 0.3 MG SL tablet Place 1 tablet (0.3 mg total) under the tongue every 5 (five) minutes as needed (esophageal pain. Do not take any more than 3 doses in 24 hours.). (Patient not taking: Reported on 05/13/2020) 100 tablet 3   No current facility-administered medications for this visit.    Allergies:   Other, Pantoprazole, Zyrtec [cetirizine], and Ciprofloxacin   Social History:  The patient  reports that she quit smoking about 52 years ago. She has never used smokeless tobacco. She reports that she does not drink alcohol and does not use drugs.   Family History:  The patient's family history includes Breast cancer (age of onset: 96) in an other family member; Breast cancer (age of onset:  60) in her daughter; Cancer in her brother; Cancer (age of onset: 23)  in an other family member; Cancer (age of onset: 42) in her sister; Hypertension in her father and sister; Kidney disease in her brother; Stroke in her father and mother.  ROS:  Please see the history of present illness.  All other systems are reviewed and otherwise negative.   PHYSICAL EXAM:  VS:  LMP 05/24/1970  BMI: There is no height or weight on file to calculate BMI. Well nourished, well developed, in no acute distress  HEENT: normocephalic, atraumatic  Neck: no JVD, carotid bruits or masses Cardiac: RRR; 1/6 SM, no extrasystoles, no rubs, or gallops Lungs:  CTA b/l, no wheezing, rhonchi or rales  Abd: soft, nontender MS: no deformity, age appropriate atrophy Ext: no edema, numerous spider veins Skin: warm and dry Neuro:  No gross deficits appreciated Psych: euthymic mood, full affect    EKG:  Done today and reviewed by myself  SR 60bm, PR , QRS 90ms, QTc Artifact, SR 70bpm, 1st degree AVblock, PR ,, QRS 98, QTc 397 08/02/19: SR 78, borderline 1st degree AVNlock, PR , QRS 90ms, QTc415ms, no ST/T changes 05/24/17 SR 69bpm, measured PR , QRS 86ms, QTc 10/28/16: SR, 76bpm, borderline 1st degree AVBlock, PR , QRS 92ms, measured QT/QTc 385ms/405ms, not significantly changed from prior    05/25/17: LHC  Mid LAD lesion is 25% stenosed.  The left ventricular systolic function is normal.  LV end diastolic pressure is low.  The left ventricular ejection fraction is 55-65% by visual estimate.  There is no aortic valve stenosis. Nonobstructive coronary artery disease in the LAD.    05/24/17: TTE  Study Conclusions - Left ventricle: The cavity size was normal. Systolic function was   vigorous. The estimated ejection fraction was in the range of 65%   to 70%. Wall motion was normal; there were no regional wall   motion abnormalities. Doppler parameters are consistent with   abnormal left ventricular relaxation (grade 1 diastolic   dysfunction).  There was no evidence of elevated ventricular   filling pressure by Doppler parameters. - Aortic valve: There was no regurgitation. - Aortic root: The aortic root was normal in size. - Mitral valve: There was mild regurgitation. - Right ventricle: The cavity size was normal. Wall thickness was   normal. Systolic function was normal. - Right atrium: The atrium was normal in size. - Tricuspid valve: There was trivial regurgitation. - Pulmonary arteries: Systolic pressure was within the normal   range. - Inferior vena cava: The vessel was normal in size. - Pericardium, extracardiac: There was no pericardial effusion.    Recent Labs: 03/08/2020: ALT 12; BUN 11; Creatinine, Ser 0.72; Hemoglobin 12.1; Platelets 226; Potassium 3.3; Sodium 143  06/04/2019: Chol/HDL Ratio 2.8; Cholesterol, Total 175; HDL 62; LDL Chol Calc (NIH) 84; Triglycerides 174   CrCl cannot be calculated (Patient's most recent lab result is older than the maximum 21 days allowed.).   Wt Readings from Last 3 Encounters:  04/30/20 120 lb 12.8 oz (54.8 kg)  04/24/20 120 lb 14.4 oz (54.8 kg)  04/01/20 124 lb (56.2 kg)     Other studies reviewed: Additional studies/records reviewed today include: summarized above  ASSESSMENT AND PLAN:  1. Palpitations, PVCs, hx of PACs     on flecainide, PRN metoprolol, pending start of low dose nifedipine     Stable intervals       Symptom episode strips on her monitor noted single PACs I  have encouraged her to start nifedipine her GI MD prescribed Her BP and HR should tolerate the small CCB dose and instructed her that she can still use the 1/2 tab of her metoprolol daily PRN as well. She had no daytime bradycardia that I can tell on her monitor She has GI follow up in place as well as Dr. Ladona Ridgel  2. HTN    Looks OK    No changes  3. Fall/syncope back in October    Not recurrent      Disposition:  RTC as scheduled, sooner if needed        Judith Blonder,  PA-C 06/02/2020 6:14 PM     [facility] [address] [place] [address] [phone] (office)  [phone] (fax)

## 2020-06-02 NOTE — Telephone Encounter (Signed)
The pt has been advised of the results and medication treatment.  The prescription has been sent.  She has also been scheduled for a follow up with Dr Ardis Hughs.  She will call if the medication does not provide relief.

## 2020-06-02 NOTE — Telephone Encounter (Signed)
Yes, please apologize for the delay. Her esophagus is 'hypercontractile' on that test (spastic) and that may be causing her symptoms.  I want her to try nifedipine 10mg  cap, one cap TID, disp 90 with 3 refills. Offer my first available OV for follow up.  Thanks

## 2020-06-02 NOTE — Telephone Encounter (Signed)
Per review of GI phone note-appears issues with palpitations is related to her esophagus and has started treatment for this and has scheduled follow up with GI.  No action needed at this time.

## 2020-06-03 NOTE — Telephone Encounter (Signed)
The pt has been advised that the medication is used for esophageal spasms.  She will pick up and try the medication and call back if she does not get any relief.

## 2020-06-03 NOTE — Telephone Encounter (Signed)
Patient called and states the pharmacy has the Procardia medication on hold said something about it not being or helping with spasms please advise

## 2020-06-04 ENCOUNTER — Other Ambulatory Visit: Payer: Self-pay

## 2020-06-04 ENCOUNTER — Ambulatory Visit (INDEPENDENT_AMBULATORY_CARE_PROVIDER_SITE_OTHER): Payer: Medicare Other | Admitting: Physician Assistant

## 2020-06-04 ENCOUNTER — Encounter: Payer: Self-pay | Admitting: Physician Assistant

## 2020-06-04 VITALS — BP 124/50 | HR 60 | Ht 65.0 in | Wt 122.0 lb

## 2020-06-04 DIAGNOSIS — Z5181 Encounter for therapeutic drug level monitoring: Secondary | ICD-10-CM | POA: Diagnosis not present

## 2020-06-04 DIAGNOSIS — I491 Atrial premature depolarization: Secondary | ICD-10-CM | POA: Diagnosis not present

## 2020-06-04 DIAGNOSIS — R002 Palpitations: Secondary | ICD-10-CM

## 2020-06-04 DIAGNOSIS — Z79899 Other long term (current) drug therapy: Secondary | ICD-10-CM

## 2020-06-04 DIAGNOSIS — I10 Essential (primary) hypertension: Secondary | ICD-10-CM

## 2020-06-04 NOTE — Patient Instructions (Signed)
Medication Instructions:   Your physician recommends that you continue on your current medications as directed. Please refer to the Current Medication list given to you today.   *If you need a refill on your cardiac medications before your next appointment, please call your pharmacy*   Lab Work: [place]    If you have labs (blood work) drawn today and your tests are completely normal, you will receive your results only by: Marland Kitchen MyChart Message (if you have MyChart) OR . A paper copy in the mail If you have any lab test that is abnormal or we need to change your treatment, we will call you to review the results.   Testing/Procedures: NONE ORDERED  TODAY    Follow-Up: At [facility], you and your health needs are our priority.  As part of our continuing mission to provide you with exceptional heart care, we have created designated Provider Care Teams.  These Care Teams include your primary Cardiologist (physician) and Advanced Practice Providers (APPs -  Physician Assistants and Nurse Practitioners) who all work together to provide you with the care you need, when you need it.  We recommend signing up for the patient portal called "MyChart".  Sign up information is provided on this After Visit Summary.  MyChart is used to connect with patients for Virtual Visits (Telemedicine).  Patients are able to view lab/test results, encounter notes, upcoming appointments, etc.  Non-urgent messages can be sent to your provider as well.   To learn more about what you can do with MyChart, go to [email or web address].    Your next appointment:  AS SCHEDULED WITH  APPOINTMENTS MADE    Other Instructions

## 2020-06-22 ENCOUNTER — Telehealth: Payer: Self-pay | Admitting: Student

## 2020-06-22 NOTE — Telephone Encounter (Signed)
   CC: Palpitations, Shaking  This is a telephone encounter between [employer] and Sanjuana Letters on 06/22/2020 for palpitations and shaking episodes. The visit was conducted with the patient located at home and Sanjuana Letters at [facility]. The patient's identity was confirmed using their DOB and current address. The patient has consented to being evaluated through a telephone encounter and understands the associated risks (an examination cannot be done and the patient may need to come in for an appointment) / benefits (allows the patient to remain at home, decreasing exposure to coronavirus). I personally spent 15 minutes on medical discussion.   HPI:  [name] is a 87 y.o. with PMH as below. She endrosed worsening of her palpitations this morning after taking nifedipine. She notes a history of palpitations that have been recently thought to be exacerbated to her esophageal  Spasms. She was started on nifedipine for these and took her first dose this am.  She notes compliance with her flecainide. Because she was having increase in palpitations today, she also took an additional metoprolol dose. She took multiple blood pressures and had recordings of 106/50 and 107/70. Patient denies symptoms of lightheadedness or dizziness while ambulation. Her heart rate during these recordings were 65-75 bmp. She denies chest pain, recent illness, fever, chills.   She also endorsed worsening shaking after nifedipine. She has chronic shaking but noticed it was worse today.   Please see A&P for assessment of the patient's acute and chronic medical conditions.   Past Medical History:  Diagnosis Date  . Anemia    iron def  . Anxiety   . Arrhythmia    paroxysmal SVT and symtomatic PAC's  . Diverticulosis    colon  . Elevated troponin   . GERD 04/02/2008   Qualifier: Diagnosis of  By: Jenny Reichmann MD, Hunt Oris   . History of colonic polyps    cecal  . Hypertension   . Keratoacanthoma type squamous cell  carcinoma of skin 10/01/2014   Left forearm - CX3 + 5FU  . Osteoporosis 07/21/2011   t score -2.7  . Palpitations 05/24/2017  . Stricture esophagus   . Vitamin D deficiency    2010   Review of Systems:  As per HPI    Assessment & Plan:   Patient endorsed worsening palpitations after taking nifedipine for her esophageal spasms. Because of these she took a dose of metoprolol.  It seems she was admitted in 2019 for palpitations with elevated troponins, catheterization performed and she was without CAD. She was recently seen by cardiology APP Tommye Standard for palpitations and shaking. She was recommended to continue her flecainide, metoprolol for palpitations and start low dose nifedipine. Because of her complex medical history and worsening palpitations, I recommended she come to the ED for further evaluation. The patient did not want to come to the ED due to long wait times. I instructed her that if anything worsens, to call 911. She agreed with this plan. Also told her I will reach out to clinic and will try and get her an appointment tomorrow. I also instructed her to hold off on blood pressure medications until she is evaluated.   Sanjuana Letters Internal Medicine Resident

## 2020-06-23 ENCOUNTER — Telehealth: Payer: Self-pay

## 2020-06-23 NOTE — Telephone Encounter (Signed)
  Received following staff message from Dr. Johnney Ou today:  ----- Message -----  From: Riesa Pope, MD  Sent: 06/22/2020  6:55 PM EST  To: Imp Front Desk Pool  Subject: Appointment                    Good Evening,   Are there any openings for this patient to be seen tomorrow? She called today and was having worsening palpitations after starting her nifedipine for her esophageal spasms and I would like her evaluated. I have put in a note for whoever sees her to review tomorrow.   Thank you!   Dr. Johnney Ou     06/23/20 @ 1055 TC to patient, she states she is doing fine today, much better than yesterday.  States she has done some housework this morning and is now resting.  She denies palpitations.  She declines appt at [facility] and states she has an appt with her Cardiologist, Dr. Lovena Le on 06/25/20.  She was instructed to seek emergency care at ED if she began to have palpitations, chest pain, SOB, or dizziness and she verbalized understanding. SChaplin, RN,BSN

## 2020-06-23 NOTE — Telephone Encounter (Signed)
Sounds good. Thank you

## 2020-06-25 ENCOUNTER — Other Ambulatory Visit: Payer: Self-pay

## 2020-06-25 ENCOUNTER — Encounter: Payer: Self-pay | Admitting: Internal Medicine

## 2020-06-25 ENCOUNTER — Ambulatory Visit (INDEPENDENT_AMBULATORY_CARE_PROVIDER_SITE_OTHER): Payer: Medicare Other | Admitting: Internal Medicine

## 2020-06-25 VITALS — BP 124/68 | HR 68 | Ht 65.0 in | Wt 117.0 lb

## 2020-06-25 DIAGNOSIS — R002 Palpitations: Secondary | ICD-10-CM

## 2020-06-25 DIAGNOSIS — I10 Essential (primary) hypertension: Secondary | ICD-10-CM | POA: Diagnosis not present

## 2020-06-25 NOTE — Progress Notes (Signed)
HPI [name] [name] returns today for followup. She is a pleasant 87 yo woman with palpitations due to PAC's and PVC's. She has been fairly well controlled on flecainide. She was seen in the ED last week with abdominal pain. No obvious etiology and it eventually resolved. No chest pain. No edema. No syncope. She has ongoing dysphagia and has lost weight as she has been unable to eat much. She has had a diagnosis of Jackhammer esophagus. She has been bothered by palpitations which are fairly well controlled by her flecainide and beta blocker.  Allergies  Allergen Reactions  . Other Anaphylaxis    Reaction to GI Cocktail  . Pantoprazole Other (See Comments)    Upset stomach and weight loss  . Zyrtec [Cetirizine] Palpitations  . Ciprofloxacin     Rash with burning all over body  . Nifedipine     Caused low BP     Current Outpatient Medications  Medication Sig Dispense Refill  . AMBULATORY NON FORMULARY MEDICATION Peppermint altoids 1-2 tabs as needed    . aspirin EC 325 MG tablet Take 325 mg by mouth daily as needed (headache).    . bismuth subsalicylate (PEPTO BISMOL) 262 MG/15ML suspension Take 30 mLs by mouth as needed.    . flecainide (TAMBOCOR) 50 MG tablet TAKE 2 TABLETS EVERY MORNING AND 1 TABLET AT NIGHT 270 tablet 3  . Hyoscyamine Sulfate SL (LEVSIN/SL) 0.125 MG SUBL Take 1 to 2 sublingual tablets(under the tongue) as needed for spasms. 60 tablet 3  . LORazepam (ATIVAN) 0.5 MG tablet TAKE 1/2 (ONE-HALF) TABLET BY MOUTH ONCE DAILY AS NEEDED FOR ANXIETY 30 tablet 0  . metoprolol succinate (TOPROL XL) 25 MG 24 hr tablet May take 1/2 tablet by mouth daily for breakthrough palpitations. 45 tablet 3  . nitroGLYCERIN (NITROSTAT) 0.3 MG SL tablet Place 1 tablet (0.3 mg total) under the tongue every 5 (five) minutes as needed (esophageal pain. Do not take any more than 3 doses in 24 hours.). 100 tablet 3   No current facility-administered medications for this visit.     Past Medical  History:  Diagnosis Date  . Anemia    iron def  . Anxiety   . Arrhythmia    paroxysmal SVT and symtomatic PAC's  . Diverticulosis    colon  . Elevated troponin   . GERD 04/02/2008   Qualifier: Diagnosis of  By: Jenny Reichmann MD, Hunt Oris   . History of colonic polyps    cecal  . Hypertension   . 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 Osteoporosis 07/21/2011   t score -2.7  . Palpitations 05/24/2017  . Stricture esophagus   . Vitamin D deficiency    2010    ROS:   All systems reviewed and negative except as noted in the HPI.   Past Surgical History:  Procedure Laterality Date  . A&P, enterocele repair     s/p 2003  . ABDOMINAL HYSTERECTOMY    . BIOPSY  02/05/2020   Procedure: BIOPSY;  Surgeon: Thornton Park, MD;  Location: [facility];  Service: Gastroenterology;;  . BREAST REDUCTION SURGERY     s/p 1996      PATIENT DENIES BREAST REDUCTION   . ESOPHAGEAL DILATION  02/05/2020   Procedure: ESOPHAGEAL DILATION;  Surgeon: Thornton Park, MD;  Location: [facility];  Service: Gastroenterology;;  . ESOPHAGEAL MANOMETRY N/A 05/07/2020   Procedure: ESOPHAGEAL MANOMETRY (EM);  Surgeon:  Milus Banister, MD;  Location: [facility];  Service: Endoscopy;  Laterality: N/A;  . ESOPHAGOGASTRODUODENOSCOPY (EGD) WITH PROPOFOL N/A 02/05/2020   Procedure: ESOPHAGOGASTRODUODENOSCOPY (EGD) WITH PROPOFOL;  Surgeon: Thornton Park, MD;  Location: [facility];  Service: Gastroenterology;  Laterality: N/A;  . HEMORRHOID SURGERY    . LEFT HEART CATH AND CORONARY ANGIOGRAPHY N/A 05/25/2017   Procedure: LEFT HEART CATH AND CORONARY ANGIOGRAPHY;  Surgeon: Jettie Booze, MD;  Location: [facility];  Service: Cardiovascular;  Laterality: N/A;     Family History  Problem Relation Age of Onset  . Stroke Father        @ 72  . Hypertension Father   . Hypertension Sister   . Cancer Sister 1       brain  . Breast cancer Other  20       breast  . Breast cancer Daughter 43       spread to her brain  . Cancer Other 58       colon/MELANOMA  . Stroke Mother        medication induced  . Kidney disease Brother   . Cancer Brother      Social History   Socioeconomic History  . Marital status: Widowed    Spouse name: Not on file  . Number of children: 2  . Years of education: Not on file  . Highest education level: Not on file  Occupational History  . Occupation: Retired  Tobacco Use  . Smoking status: Former Smoker    Quit date: 05/24/1968    Years since quitting: 52.1  . Smokeless tobacco: Never Used  . Tobacco comment: only smoked 7 years  Vaping Use  . Vaping Use: Never used  Substance and Sexual Activity  . Alcohol use: No    Alcohol/week: 0.0 standard drinks  . Drug use: No  . Sexual activity: Never    Comment: 1st intercourse 50 yo-1 partner  Other Topics Concern  . Not on file  Social History Narrative  . Not on file   Social Determinants of Health   Financial Resource Strain: Low Risk   . Difficulty of Paying Living Expenses: Not very hard  Food Insecurity: No Food Insecurity  . Worried About Charity fundraiser in the Last Year: Never true  . Ran Out of Food in the Last Year: Never true  Transportation Needs: No Transportation Needs  . Lack of Transportation (Medical): No  . Lack of Transportation (Non-Medical): No  Physical Activity: Not on file  Stress: Not on file  Social Connections: Not on file  Intimate Partner Violence: Not on file     BP 124/68   Pulse 68   Ht 5\' 5"  (1.651 m)   Wt 117 lb (53.1 kg)   LMP 05/24/1970   SpO2 98%   BMI 19.47 kg/m   Physical Exam:  frail appearing NAD HEENT: Unremarkable Neck:  No JVD, no thyromegally Lymphatics:  No adenopathy Back:  No CVA tenderness Lungs:  Clear with no wheezes HEART:  Regular rate rhythm, no murmurs, no rubs, no clicks Abd:  soft, positive bowel sounds, no organomegally, no rebound, no guarding Ext:  2 plus  pulses, no edema, no cyanosis, no clubbing Skin:  No rashes no nodules Neuro:  CN II through XII intact, motor grossly intact    Assess/Plan: 1. Palpitations - her symptoms are still reasonably well controlled.  2. Dysphagia - she is losing weight and her symptoms remain present. She will follow  with Dr. Ardis Hughs.  3. Hypotension - she could not tolerate nifedipine due to low bp. She will hold off on this.   Carleene Overlie Clarabell Matsuoka,MD

## 2020-06-25 NOTE — Patient Instructions (Addendum)
Medication Instructions:  Your physician recommends that you continue on your current medications as directed. Please refer to the Current Medication list given to you today.   Labwork: None ordered   Testing/Procedures: None ordered   Follow-Up: Your physician wants you to follow-up in: 4 months with Renee Ursuy, PA   Any Other Special Instructions Will Be Listed Below (If Applicable).     If you need a refill on your cardiac medications before your next appointment, please call your pharmacy.    

## 2020-07-08 ENCOUNTER — Other Ambulatory Visit: Payer: Self-pay

## 2020-07-08 MED ORDER — FLECAINIDE ACETATE 50 MG PO TABS: ORAL_TABLET | ORAL | 0 refills | Status: DC

## 2020-07-22 ENCOUNTER — Encounter: Payer: Self-pay | Admitting: Gastroenterology

## 2020-07-22 ENCOUNTER — Ambulatory Visit (INDEPENDENT_AMBULATORY_CARE_PROVIDER_SITE_OTHER): Payer: Medicare Other | Admitting: Gastroenterology

## 2020-07-22 VITALS — BP 120/66 | HR 67 | Ht 65.0 in | Wt 122.0 lb

## 2020-07-22 DIAGNOSIS — R1310 Dysphagia, unspecified: Secondary | ICD-10-CM

## 2020-07-22 NOTE — Progress Notes (Signed)
this review of pertinent gastrointestinal problems: 1.  Dysphagia, weight loss: 12/2019: UGI series: Moderate presbyesophagus. Small prox duodenal tic. No HH or GERD. 12/2019 MBSS: limited due to pt reticence to participate.  "functional oropharyngeal swallow ability with no aspiration or deep laryngeal penetration of any consistency tested. Trace laryngeal penetration on swallow of thin via straw is [facility] for age. In addition, nopharyngeal residuals post-swallow. Minimal difficulty orally transiting pudding bolus with appearance of near gagging but with encouragement pt able to propel posterior and swallow. No oral issues with cracker, nectar or thin. SLP oral holding/near gagging with puree was anxiety driven." EGD September 2021 Dr. Tarri Glenn while in hospital showed a normal esophagus that was dilated and then biopsied, also showed mild gastropathy.  The biopsies from these showed no clear sign of etiology.  High-res esophageal manometry December 2021, normal GE junction, + evidence of nutcracker esophagus.  Trial of nifedipine 10 mg capsules 1 cap 3 times daily.  This caused some lightheadedness, low blood pressure and so she has been relying on hyoscyamine, blender food.   HPI: This is a very pleasant 87 year old woman who is here with her son today.  She has a fairly well-established nutcracker esophagus.  She is very happy with the hyoscyamine sublingual medicine.  She only needs it once or twice a week lately.  She has clearly modified her diet so that she is not as bothered by pain and dysphagia.  She uses blander food, she drinks 4 or 5 ensures daily.  Every once in a while she will have some raisin toast.  She has lost 2 pounds since her last visit here, same scale in our office.  ROS: complete GI ROS as described in HPI, all other review negative.  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   Left forearm - CX3 + 5FU   Osteoporosis 07/21/2011   t score -2.7   Palpitations 05/24/2017   Stricture esophagus    Vitamin D deficiency    2010    Past Surgical History:  Procedure Laterality Date   A&P, enterocele repair     s/p 2003   ABDOMINAL HYSTERECTOMY     BIOPSY  02/05/2020   Procedure: BIOPSY;  Surgeon: Thornton Park, MD;  Location: [facility];  Service: Gastroenterology;;   BREAST REDUCTION SURGERY     s/p 1996      PATIENT DENIES BREAST REDUCTION    ESOPHAGEAL DILATION  02/05/2020   Procedure: ESOPHAGEAL DILATION;  Surgeon: Thornton Park, MD;  Location: [facility];  Service: Gastroenterology;;   ESOPHAGEAL MANOMETRY N/A 05/07/2020   Procedure: ESOPHAGEAL MANOMETRY (EM);  Surgeon: Milus Banister, MD;  Location: [facility];  Service: Endoscopy;  Laterality: N/A;   ESOPHAGOGASTRODUODENOSCOPY (EGD) WITH PROPOFOL N/A 02/05/2020   Procedure: ESOPHAGOGASTRODUODENOSCOPY (EGD) WITH PROPOFOL;  Surgeon: Thornton Park, MD;  Location: [place];  Service: Gastroenterology;  Laterality: N/A;   HEMORRHOID SURGERY     LEFT HEART CATH AND CORONARY ANGIOGRAPHY N/A 05/25/2017   Procedure: LEFT HEART CATH AND CORONARY ANGIOGRAPHY;  Surgeon: Jettie Booze, MD;  Location: [place] [facility];  Service: Cardiovascular;  Laterality: N/A;    Current Outpatient Medications  Medication Sig Dispense Refill   AMBULATORY NON FORMULARY MEDICATION Peppermint altoids 1-2 tabs as needed     aspirin EC 325 MG tablet Take 325 mg by mouth daily as needed (headache).     bismuth subsalicylate (PEPTO BISMOL) 262 MG/15ML suspension Take 30 mLs by mouth as needed.     flecainide  (TAMBOCOR) 50 MG tablet TAKE 2 TABLETS EVERY MORNING AND 1 TABLET AT NIGHT 90 tablet 0   Hyoscyamine Sulfate SL (LEVSIN/SL) 0.125 MG SUBL Take 1 to 2 sublingual tablets(under the tongue) as needed for spasms. 60 tablet 3   LORazepam (ATIVAN) 0.5 MG tablet TAKE 1/2 (ONE-HALF) TABLET BY MOUTH ONCE DAILY AS NEEDED FOR ANXIETY 30 tablet 0   metoprolol succinate (TOPROL XL) 25 MG 24 hr tablet May take 1/2 tablet by mouth daily for breakthrough palpitations. 45 tablet 3   nitroGLYCERIN (NITROSTAT) 0.3 MG SL tablet Place 1 tablet (0.3 mg total) under the tongue every 5 (five) minutes as needed (esophageal pain. Do not take any more than 3 doses in 24 hours.). 100 tablet 3   No current facility-administered medications for this visit.    Allergies as of 07/22/2020 - Review Complete 07/22/2020  Allergen Reaction Noted   Other Anaphylaxis 07/22/2017   Pantoprazole Other (See Comments) 07/22/2017   Zyrtec [cetirizine] Palpitations 04/11/2014   Ciprofloxacin  03/09/2019   Nifedipine  06/25/2020    Family History  Problem Relation Age of Onset   Stroke Father        @ 6   Hypertension Father    Hypertension Sister    Cancer Sister 22       brain   Breast cancer Other 83       breast   Breast cancer Daughter 19       spread to her brain   Cancer Other 59       colon/MELANOMA   Stroke Mother        medication induced   Kidney disease Brother    Cancer Brother     Social History   Socioeconomic History   Marital status: Widowed    Spouse name: Not on file   Number of children: 2   Years of education: Not on file   Highest education level: Not on file  Occupational History   Occupation: Retired  Tobacco Use   Smoking status: Former Smoker    Quit date: 05/24/1968    Years since quitting: 52.1   Smokeless tobacco: Never Used   Tobacco comment: only smoked 7 years  Vaping Use   Vaping Use: Never used  Substance and Sexual Activity   Alcohol use: No     Alcohol/week: 0.0 standard drinks   Drug use: No   Sexual activity: Never    Comment: 1st intercourse 11 yo-1 partner  Other Topics Concern   Not on file  Social History Narrative   Not on file   Social Determinants of Health   Financial Resource Strain: Low Risk    Difficulty of Paying Living Expenses: Not very hard  Food Insecurity: No Food Insecurity   Worried About Charity fundraiser in the Last Year: Never true   [place] in the Last Year: Never true  Transportation Needs: No Transportation Needs   Lack of Transportation (Medical): No   Lack of Transportation (Non-Medical): No  Physical  Activity: Not on file  Stress: Not on file  Social Connections: Not on file  Intimate Partner Violence: Not on file     Physical Exam: Ht 5\' 5"  (1.651 m)    Wt 122 lb (55.3 kg)    LMP 05/24/1970    BMI 20.30 kg/m  Constitutional: generally well-appearing Psychiatric: alert and oriented x3 Abdomen: soft, nontender, nondistended, no obvious ascites, no peritoneal signs, normal bowel sounds No peripheral edema noted in lower extremities  Assessment and plan: 87 y.o. female with nutcracker esophagus  She has modified her diet so that she is able to maintain her nutrition status fairly well with blander food, and she words.  I suggested a trial of Carnation Instant Breakfast and whole milk as it is fairly similar to an sure and quite a bit less expensive.  I also recommended she try challenging her esophagus with solids.  Every few days.  She knows to chew it very well and eat slowly.  She will return to see me in 3 months and sooner if any problems.  Please see the "Patient Instructions" section for addition details about the plan.  Owens Loffler, MD [facility] 07/22/2020, 8:55 AM   Total time on date of encounter was 30 minutes (this included time spent preparing to see the patient reviewing records; obtaining and/or reviewing separately obtained history;  performing a medically appropriate exam and/or evaluation; counseling and educating the patient and family if present; ordering medications, tests or procedures if applicable; and documenting clinical information in the health record).

## 2020-07-22 NOTE — Patient Instructions (Addendum)
If you are age 85 or older, your body mass index should be between 23-30. Your Body mass index is 20.3 kg/m. If this is out of the aforementioned range listed, please consider follow up with your Primary Care Provider.  Please continue to follow current diet with blended foods.  Try carnation instant breakfasts in the morning.  Remember to try solid foods every few days.  You will need a follow up appointment in 3 months (June 2022).  We will contact you to schedule this appointment.  Thank you for entrusting me with your care and choosing [facility].  Dr Ardis Hughs

## 2020-07-31 ENCOUNTER — Other Ambulatory Visit: Payer: Self-pay | Admitting: Internal Medicine

## 2020-08-14 ENCOUNTER — Telehealth: Payer: Self-pay | Admitting: Gastroenterology

## 2020-08-14 NOTE — Telephone Encounter (Signed)
I spoke with the pt and she tells me that she has palpitations everytime she eats.  She has radiating pain to under the breast bone.  She says this is not a new symptom.  She was advised to call her cardiologist for an appt.to discuss and I will send to Dr Ardis Hughs for evaluation.

## 2020-08-18 NOTE — Telephone Encounter (Signed)
5/3 at 130 pm appt made with Dr Ardis Hughs.  Pt advised

## 2020-08-18 NOTE — Telephone Encounter (Signed)
Please offer her my first available follow-up appointment, do not double book.  Thank you

## 2020-08-19 ENCOUNTER — Telehealth: Payer: Self-pay | Admitting: Internal Medicine

## 2020-08-19 NOTE — Telephone Encounter (Signed)
Patient c/o Palpitations:  High priority if patient c/o lightheadedness, shortness of breath, or chest pain  1) How long have you had palpitations/irregular HR/ Afib? Are you having the symptoms now?  Patient states she has been having palpitations for the past few months on and off. No palpitations currently.     2) Are you currently experiencing lightheadedness, SOB or CP?  No.  3) Do you have a history of afib (atrial fibrillation) or irregular heart rhythm?  Yes, patient states she has a history of an irregular heart rhythm.   4) Have you checked your BP or HR? (document readings if available):  Patient states she does not have any readings available, but her BP/HR have been lower than normal.   5) Are you experiencing any other symptoms?  Headaches.

## 2020-08-19 NOTE — Telephone Encounter (Signed)
Spoke with pt who reports increased palpitations x 2-3 weeks.  Pt states she is taking Flecainide as prescribed and is having to take Metoprolol Succinate 25mg  - 1/2 tablet daily, which does help some.  Pt is monitoring her BP and HR.  Pt states she feels the increased palpitations are coming from her esophageal issues and swallowing.  She has been evaluated for this problem.  She has scheduled an appointment to see Tommye Standard, PA-C 10/2020.  Reviewed ED precautions.  Will forward information to Dr Lovena Le for review and recommendation.

## 2020-08-20 ENCOUNTER — Encounter: Payer: Self-pay | Admitting: *Deleted

## 2020-08-20 NOTE — Progress Notes (Unsigned)
Things That May Be Affecting Your Health:  Alcohol  Hearing loss  Pain   x Depression x Home Safety  Sexual Health   Diabetes  Lack of physical activity  Stress   Difficulty with daily activities  Loneliness  Tiredness   Drug use x Medicines  Tobacco use  x Falls  Motor Vehicle Safety  Weight   Food choices  Oral Health  Other    YOUR PERSONALIZED HEALTH PLAN : 1. Schedule your next subsequent Medicare Wellness visit in one year 2. Attend all of your regular appointments to address your medical issues 3. Complete the preventative screenings and services   Annual Wellness Visit   Medicare Covered Preventative Screenings and [place] Men and Women Who How Often Need? Date of Last Service Action  Abdominal Aortic Aneurysm Adults with AAA risk factors Once      Alcohol Misuse and Counseling All Adults Screening once a year if no alcohol misuse. Counseling up to 4 face to face sessions.     Bone Density Measurement  Adults at risk for osteoporosis Once every 2 yrs      Lipid Panel Z13.6 All adults without CV disease Once every 5 yrs       Colorectal Cancer   Stool sample or  Colonoscopy All adults 86 and older   Once every year  Every 10 years        Depression All Adults Once a year x Today   Diabetes Screening Blood glucose, post glucose load, or GTT Z13.1  All adults at risk  Pre-diabetics  Once per year  Twice per year      Diabetes  Self-Management Training All adults Diabetics 10 hrs first year; 2 hours subsequent years. Requires Copay     Glaucoma  Diabetics  Family history of glaucoma  African Americans 44 yrs +  Hispanic Americans 64 yrs + Annually - requires coppay      Hepatitis C Z72.89 or F19.20  High Risk for HCV  Born between 1945 and 1965  Annually  Once      HIV Z11.4 All adults based on risk  Annually btw ages 57 & 34 regardless of risk  Annually > 65 yrs if at increased risk      Lung Cancer Screening  Asymptomatic adults aged 76-77 with 30 pack yr history and current smoker OR quit within the last 15 yrs Annually Must have counseling and shared decision making documentation before first screen      Medical Nutrition Therapy Adults with   Diabetes  Renal disease  Kidney transplant within past 3 yrs 3 hours first year; 2 hours subsequent years     Obesity and Counseling All adults Screening once a year Counseling if BMI 30 or higher  Today   Tobacco Use Counseling Adults who use tobacco  Up to 8 visits in one year     Vaccines Z23  Hepatitis B  Influenza   Pneumonia  Adults   Once  Once every flu season  Two different vaccines separated by one year     Next Annual Wellness Visit People with Medicare Every year  Today     Services & Screenings Women Who How Often Need  Date of Last Service Action  Mammogram  Z12.31 Women over 50 One baseline ages 61-39. Annually ager 40 yrs+      Pap tests All women Annually if high risk. Every 2 yrs for normal risk women  Screening for cervical cancer with   Pap (Z01.419 nl or Z01.411abnl) &  HPV Z11.51 Women aged 83 to 101 Once every 5 yrs     Screening pelvic and breast exams All women Annually if high risk. Every 2 yrs for normal risk women     Sexually Transmitted Diseases  Chlamydia  Gonorrhea  Syphilis All at risk adults Annually for non pregnant females at increased risk         [place] Men Who How Ofter Need  Date of Last Service Action  Prostate Cancer - DRE & PSA Men over 50 Annually.  DRE might require a copay.        Sexually Transmitted Diseases  Syphilis All at risk adults Annually for men at increased risk      Health Maintenance List Health Maintenance  Topic Date Due  . COVID-19 Vaccine (3 - Pfizer risk 4-dose series) 09/03/2019  . TETANUS/TDAP  03/25/2026  . INFLUENZA VACCINE  Completed  . DEXA SCAN  Completed  . PNA vac Low Risk Adult  Completed  . HPV VACCINES  Aged Out

## 2020-08-20 NOTE — Telephone Encounter (Signed)
Noted. We do not have much more we can do for her at this time. Take metoprolol for break through palpitations.

## 2020-08-20 NOTE — Progress Notes (Unsigned)

## 2020-08-27 NOTE — Telephone Encounter (Signed)
No action needed

## 2020-08-28 ENCOUNTER — Other Ambulatory Visit: Payer: Self-pay

## 2020-08-28 ENCOUNTER — Emergency Department (HOSPITAL_COMMUNITY): Payer: Medicare Other

## 2020-08-28 ENCOUNTER — Emergency Department (HOSPITAL_COMMUNITY)
Admission: EM | Admit: 2020-08-28 | Discharge: 2020-08-28 | Disposition: A | Discharge status: Home / Self Care | Payer: Medicare Other | Attending: Emergency Medicine | Admitting: Emergency Medicine

## 2020-08-28 DIAGNOSIS — J439 Emphysema, unspecified: Secondary | ICD-10-CM | POA: Diagnosis not present

## 2020-08-28 DIAGNOSIS — Z85828 Personal history of other malignant neoplasm of skin: Secondary | ICD-10-CM | POA: Insufficient documentation

## 2020-08-28 DIAGNOSIS — R079 Chest pain, unspecified: Secondary | ICD-10-CM | POA: Diagnosis not present

## 2020-08-28 DIAGNOSIS — R0602 Shortness of breath: Secondary | ICD-10-CM | POA: Diagnosis not present

## 2020-08-28 DIAGNOSIS — Z87891 Personal history of nicotine dependence: Secondary | ICD-10-CM | POA: Diagnosis not present

## 2020-08-28 DIAGNOSIS — J449 Chronic obstructive pulmonary disease, unspecified: Secondary | ICD-10-CM | POA: Diagnosis not present

## 2020-08-28 DIAGNOSIS — R001 Bradycardia, unspecified: Secondary | ICD-10-CM | POA: Diagnosis not present

## 2020-08-28 DIAGNOSIS — R55 Syncope and collapse: Secondary | ICD-10-CM | POA: Diagnosis not present

## 2020-08-28 DIAGNOSIS — Z79899 Other long term (current) drug therapy: Secondary | ICD-10-CM | POA: Diagnosis not present

## 2020-08-28 DIAGNOSIS — R002 Palpitations: Secondary | ICD-10-CM | POA: Diagnosis not present

## 2020-08-28 DIAGNOSIS — I10 Essential (primary) hypertension: Secondary | ICD-10-CM | POA: Diagnosis not present

## 2020-08-28 DIAGNOSIS — Z955 Presence of coronary angioplasty implant and graft: Secondary | ICD-10-CM | POA: Insufficient documentation

## 2020-08-28 DIAGNOSIS — Z7982 Long term (current) use of aspirin: Secondary | ICD-10-CM | POA: Diagnosis not present

## 2020-08-28 DIAGNOSIS — R531 Weakness: Secondary | ICD-10-CM | POA: Diagnosis not present

## 2020-08-28 DIAGNOSIS — R0789 Other chest pain: Secondary | ICD-10-CM | POA: Diagnosis not present

## 2020-08-28 LAB — CBC WITH DIFFERENTIAL/PLATELET
Abs Immature Granulocytes: 0.01 10*3/uL (ref 0.00–0.07)
Basophils Absolute: 0 10*3/uL (ref 0.0–0.1)
Basophils Relative: 1 %
Eosinophils Absolute: 0 10*3/uL (ref 0.0–0.5)
Eosinophils Relative: 1 %
HCT: 38.6 % (ref 36.0–46.0)
Hemoglobin: 13 g/dL (ref 12.0–15.0)
Immature Granulocytes: 0 %
Lymphocytes Relative: 30 %
Lymphs Abs: 1.2 10*3/uL (ref 0.7–4.0)
MCH: 32.1 pg (ref 26.0–34.0)
MCHC: 33.7 g/dL (ref 30.0–36.0)
MCV: 95.3 fL (ref 80.0–100.0)
Monocytes Absolute: 0.4 10*3/uL (ref 0.1–1.0)
Monocytes Relative: 10 %
Neutro Abs: 2.3 10*3/uL (ref 1.7–7.7)
Neutrophils Relative %: 58 %
Platelets: 216 10*3/uL (ref 150–400)
RBC: 4.05 MIL/uL (ref 3.87–5.11)
RDW: 13.6 % (ref 11.5–15.5)
WBC: 4 10*3/uL (ref 4.0–10.5)
nRBC: 0 % (ref 0.0–0.2)

## 2020-08-28 LAB — TROPONIN I (HIGH SENSITIVITY)
Troponin I (High Sensitivity): 4 ng/L (ref <18)
Troponin I (High Sensitivity): 7 ng/L (ref <18)

## 2020-08-28 LAB — BRAIN NATRIURETIC PEPTIDE: B Natriuretic Peptide: 56.7 pg/mL (ref 0.0–100.0)

## 2020-08-28 LAB — COMPREHENSIVE METABOLIC PANEL
ALT: 14 U/L (ref 0–44)
AST: 33 U/L (ref 15–41)
Albumin: 4 g/dL (ref 3.5–5.0)
Alkaline Phosphatase: 64 U/L (ref 38–126)
Anion gap: 9 (ref 5–15)
BUN: 13 mg/dL (ref 8–23)
CO2: 25 mmol/L (ref 22–32)
Calcium: 9.4 mg/dL (ref 8.9–10.3)
Chloride: 106 mmol/L (ref 98–111)
Creatinine, Ser: 0.72 mg/dL (ref 0.44–1.00)
GFR, Estimated: >60 mL/min (ref >60)
Glucose, Bld: 91 mg/dL (ref 70–99)
Potassium: 4 mmol/L (ref 3.5–5.1)
Sodium: 140 mmol/L (ref 135–145)
Total Bilirubin: 1.1 mg/dL (ref 0.3–1.2)
Total Protein: 6.7 g/dL (ref 6.5–8.1)

## 2020-08-28 LAB — TSH: TSH: 2.093 u[IU]/mL (ref 0.350–4.500)

## 2020-08-28 LAB — D-DIMER, QUANTITATIVE: D-Dimer, Quant: 0.63 ug/mL-FEU — ABNORMAL HIGH (ref 0.00–0.50)

## 2020-08-28 MED ORDER — FLECAINIDE ACETATE 50 MG PO TABS
50.0000 mg | ORAL_TABLET | Freq: Once | ORAL | Status: AC
  Administered 2020-08-28: 50 mg via ORAL
  Filled 2020-08-28: qty 1

## 2020-08-28 NOTE — ED Provider Notes (Signed)
[facility] EMERGENCY DEPARTMENT Provider Note   CSN: [id number] Arrival date & time: 08/28/20  1618     History Chief Complaint  Patient presents with  . Near Syncope  . Hypertension    [name] [name] is a 87 y.o. female.  Patient is an 87 year old female with past medical history significant for PSVT, anxiety, nutcracker esophagus, hypertension, who presents today from home with complaints of palpitation and elevated blood pressures.  She reports that this has been occurring over the last few weeks off and on.  She states that today, the palpitations became the most severe that they have been, and she called EMS.  She takes metoprolol as needed for palpitations and took half a tablet this morning and 1 around 3 PM.  She reports that her blood pressures have been cycling up and down, high she is seen and is in 180s over 100s, lowest has been in 90s over 40s, this was about 4 weeks ago after she took a new medication.  She relates these palpitations to her exacerbation of her nutcracker esophagus.  States otherwise she has been in her normal state of health.  No fevers or chills.  She lives alone, her son lives next door.  Presented via EMS, had reported near syncopal event with bradycardia and decrease in BP to 150s, reported change in mental status at that time.  She did not receive any medications in route.        Past Medical History:  Diagnosis Date  . Anemia    iron def  . Anxiety   . Arrhythmia    paroxysmal SVT and symtomatic PAC's  . Diverticulosis    colon  . Elevated troponin   . GERD 04/02/2008   Qualifier: Diagnosis of  By: Jenny Reichmann MD, Hunt Oris   . History of colonic polyps    cecal  . Hypertension   . 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 Osteoporosis 07/21/2011   t score -2.7  . Palpitations 05/24/2017  . Stricture esophagus   . Vitamin D deficiency    2010    Patient Active Problem  List   Diagnosis Date Noted  . Abdominal pain, chronic, epigastric   . [place]hammer esophagus   . Influenza vaccine administered 03/31/2020  . Fall 03/17/2020  . Dysphagia 02/05/2020  . Gastritis and gastroduodenitis   . Esophageal spasm 01/17/2020  . Difficulty swallowing pills 08/17/2019  . Sore throat 08/13/2019  . Varicose veins of both legs with edema 06/04/2019  . Hyperlipidemia 06/04/2019  . History of repair of rectocele 03/13/2019  . Allergic reaction caused by a drug 03/13/2019  . TMJ pain dysfunction syndrome 09/25/2018  . Atypical chest pain 05/23/2017  . Rotator cuff arthropathy, right 04/08/2016  . Arthritis of knee 03/25/2016  . Presbycusis of both ears 01/15/2016  . Tinnitus of right ear 01/15/2016  . Skin cancer of arm 09/26/2014  . Palpitation 09/28/2012  . Rhinitis 12/25/2011  . History of TIA (transient ischemic attack) 07/19/2011  . ATRIAL PREMATURE BEATS 03/20/2009  . ANEMIA-IRON DEFICIENCY 04/02/2008  . Anxiety 04/02/2008  . Essential hypertension 04/02/2008  . Osteoporosis 04/02/2008    Past Surgical History:  Procedure Laterality Date  . A&P, enterocele repair     s/p 2003  . ABDOMINAL HYSTERECTOMY    . BIOPSY  02/05/2020   Procedure: BIOPSY;  Surgeon: Thornton Park, MD;  Location: [facility];  Service: Gastroenterology;;  .  BREAST REDUCTION SURGERY     s/p 1996      PATIENT DENIES BREAST REDUCTION   . ESOPHAGEAL DILATION  02/05/2020   Procedure: ESOPHAGEAL DILATION;  Surgeon: Thornton Park, MD;  Location: [facility] ENDOSCOPY;  Service: Gastroenterology;;  . ESOPHAGEAL MANOMETRY N/A 05/07/2020   Procedure: ESOPHAGEAL MANOMETRY (EM);  Surgeon: Milus Banister, MD;  Location: [facility] ENDOSCOPY;  Service: Endoscopy;  Laterality: N/A;  . ESOPHAGOGASTRODUODENOSCOPY (EGD) WITH PROPOFOL N/A 02/05/2020   Procedure: ESOPHAGOGASTRODUODENOSCOPY (EGD) WITH PROPOFOL;  Surgeon: Thornton Park, MD;  Location: [facility];  Service: Gastroenterology;  Laterality:  N/A;  . HEMORRHOID SURGERY    . LEFT HEART CATH AND CORONARY ANGIOGRAPHY N/A 05/25/2017   Procedure: LEFT HEART CATH AND CORONARY ANGIOGRAPHY;  Surgeon: Jettie Booze, MD;  Location: [facility];  Service: Cardiovascular;  Laterality: N/A;     OB History    Gravida  2   Para  2   Term      Preterm      AB      Living  1     SAB      IAB      Ectopic      Multiple      Live Births              Family History  Problem Relation Age of Onset  . Stroke Father        @ 50  . Hypertension Father   . Hypertension Sister   . Cancer Sister 39       brain  . Breast cancer Other 33       breast  . Breast cancer Daughter 72       spread to her brain  . Cancer Other 58       colon/MELANOMA  . Stroke Mother        medication induced  . Kidney disease Brother   . Cancer Brother     Social History   Tobacco Use  . Smoking status: Former Smoker    Quit date: 05/24/1968    Years since quitting: 52.2  . Smokeless tobacco: Never Used  . Tobacco comment: only smoked 7 years  Vaping Use  . Vaping Use: Never used  Substance Use Topics  . Alcohol use: No    Alcohol/week: 0.0 standard drinks  . Drug use: No    Home Medications Prior to Admission medications   Medication Sig Start Date End Date Taking? Authorizing Provider  AMBULATORY NON FORMULARY MEDICATION Peppermint altoids 1-2 tabs as needed    [provider]  aspirin EC 325 MG tablet Take 325 mg by mouth daily as needed (headache).    [provider]  bismuth subsalicylate (PEPTO BISMOL) 262 MG/15ML suspension Take 30 mLs by mouth as needed.    [provider]  flecainide (TAMBOCOR) 50 MG tablet TAKE 2 TABLETS EVERY MORNING AND 1 TABLET AT NIGHT 07/08/20   Evans Lance, MD  Hyoscyamine Sulfate SL (LEVSIN/SL) 0.125 MG SUBL Take 1 to 2 sublingual tablets(under the tongue) as needed for spasms. 04/01/20   Milus Banister, MD  LORazepam (ATIVAN) 0.5 MG tablet TAKE 1/2  (ONE-HALF) TABLET BY MOUTH ONCE DAILY AS NEEDED FOR ANXIETY 08/01/20   [name], MD  metoprolol succinate (TOPROL XL) 25 MG 24 hr tablet May take 1/2 tablet by mouth daily for breakthrough palpitations. 04/22/20   Evans Lance, MD  nitroGLYCERIN (NITROSTAT) 0.3 MG SL tablet Place 1 tablet (0.3 mg  total) under the tongue every 5 (five) minutes as needed (esophageal pain. Do not take any more than 3 doses in 24 hours.). 03/11/20 03/11/21  [name], MD    Allergies    Other, Pantoprazole, Zyrtec [cetirizine], Ciprofloxacin, and Nifedipine  Review of Systems   Review of Systems  Constitutional: Negative for activity change, appetite change, chills, diaphoresis and fever.  HENT: Positive for trouble swallowing (chronic). Negative for congestion.   Eyes: Negative for visual disturbance.  Respiratory: Positive for chest tightness and shortness of breath.   Cardiovascular: Positive for palpitations. Negative for chest pain and leg swelling.  Gastrointestinal: Negative for abdominal pain, constipation, diarrhea, nausea and vomiting.  Genitourinary: Negative for difficulty urinating, dysuria and hematuria.  Neurological: Negative for speech difficulty and numbness.       Near-syncope with EMS  Psychiatric/Behavioral: Negative for confusion.    Physical Exam Updated Vital Signs BP 133/81   Pulse 63   Temp 97.9 F (36.6 C) (Oral)   Resp 17   Ht 5\' 5"  (1.651 m)   Wt 52.6 kg   LMP 05/24/1970   SpO2 98%   BMI 19.30 kg/m   Physical Exam Constitutional:      General: She is not in acute distress.    Appearance: She is not ill-appearing or diaphoretic.  HENT:     Head: Normocephalic and atraumatic.     Nose: Nose normal.     Mouth/Throat:     Mouth: Mucous membranes are moist.  Eyes:     Extraocular Movements: Extraocular movements intact.  Cardiovascular:     Rate and Rhythm: Normal rate and regular rhythm.     Heart sounds: No murmur heard. No friction rub. No gallop.    Pulmonary:     Effort: Pulmonary effort is normal.     Breath sounds: Normal breath sounds. No wheezing, rhonchi or rales.  Abdominal:     General: Abdomen is flat. There is no distension.     Palpations: Abdomen is soft.     Tenderness: There is no abdominal tenderness.  Musculoskeletal:     Cervical back: Normal range of motion and neck supple. No tenderness.     Right lower leg: No edema.     Left lower leg: No edema.  Skin:    General: Skin is warm and dry.     Capillary Refill: Capillary refill takes less than 2 seconds.  Neurological:     General: No focal deficit present.     Mental Status: She is alert and oriented to person, place, and time.     Sensory: No sensory deficit.     Motor: No weakness.  Psychiatric:        Mood and Affect: Mood normal.        Behavior: Behavior normal.     Comments: Slightly anxious     ED Results / Procedures / Treatments   Labs (all labs ordered are listed, but only abnormal results are displayed) Labs Reviewed  D-DIMER, QUANTITATIVE (NOT AT [facility]) - Abnormal; Notable for the following components:      Result Value   D-Dimer, Quant 0.63 (*)    All other components within normal limits  CBC WITH DIFFERENTIAL/PLATELET  COMPREHENSIVE METABOLIC PANEL  TSH  BRAIN NATRIURETIC PEPTIDE  MAGNESIUM  TROPONIN I (HIGH SENSITIVITY)  TROPONIN I (HIGH SENSITIVITY)    EKG EKG Interpretation  Date/Time:  Thursday August 28 2020 16:50:07 EDT Ventricular Rate:  76 PR Interval:  234 QRS Duration: 102 QT Interval:  400 QTC Calculation: 450 R Axis:   54 Text Interpretation: Sinus rhythm Prolonged PR interval Probable anteroseptal infarct, old Confirmed by Gerlene Fee [phone]) on 08/28/2020 4:52:52 PM   Radiology DG Chest Port 1 View  Result Date: 08/28/2020 CLINICAL DATA:  Short of breath, palpitations, hypertension, elevated blood pressure EXAM: PORTABLE CHEST 1 VIEW COMPARISON:  Portable exam 1703 hours compared to 12/15/2019 FINDINGS:  Normal heart size, mediastinal contours, and pulmonary vascularity. Atherosclerotic calcification aorta. Emphysematous and bronchitic changes consistent with COPD. Chronic accentuation of basilar interstitial markings greater on LEFT, unchanged. No acute infiltrate, pleural effusion, or pneumothorax. Osseous demineralization. IMPRESSION: Changes of COPD and basilar chronic interstitial lung disease. No acute abnormalities. Aortic Atherosclerosis (ICD10-I70.0) and Emphysema (ICD10-J43.9). Electronically Signed   By: Lavonia Dana M.D.   On: 08/28/2020 17:15    Procedures Procedures   Medications Ordered in ED Medications  flecainide (TAMBOCOR) tablet 50 mg (50 mg Oral Given 08/28/20 1926)    ED Course  I have reviewed the triage vital signs and the nursing notes.  Pertinent labs & imaging results that were available during my care of the patient were reviewed by me and considered in my medical decision making (see chart for details).  Clinical Course as of 08/28/20 2047  Thu Aug 28, 2020  1750 DG Chest [place] 1 View [BM]    Clinical Course User Index [BM] Steven Veazie, Bernita Raisin, DO   MDM Rules/Calculators/A&P                          Patient is an 87 year old female with known past medical history of PSVT, anxiety, nutcracker esophagus, hypertension, who presents with severe asymptomatic hypertension, acute on chronic palpitations and near syncopal event with EMS with slight change in mental status with drop in blood pressure from 604 systolic to 540J.  Differential remains broad at this time, she does report a history of hypokalemia, this could cause an arrhythmia.  She has a known history of PSVT which could be exacerbating the issue.  Could also consider worsening of palpitations in the setting of uncontrolled hypertension.  Will obtain troponin to rule out signs of myocardial infarction.  EKG to rule out new arrhythmia.  She is now mentally at her baseline and grossly neurologically intact on  examination.  She did report taking metoprolol around 3:00, therefore timing and EMS of bradycardia event could have been related to this metoprolol use.  Near syncopal event with EMS could be related to bradycardia or rapid decrease in patient's blood pressure.   Her blood pressure on presentation was 180/70, however on examination she is cycling as low as 811 systolic.  Given this, will hold off on any blood pressure medications as she is not showing signs of endorgan damage at this time.  She reports compliance with flecanide and has not missed any doses.  Will obtain CMP, CBC, troponins, chest x-ray, EKG, troponins and continue to monitor.  Now reports that she has been having some squeezing chest pain with this and possible swelling in her right lower extremity more than usual.  We will add on D-dimer and BNP.  If D-dimer is positive, will order CTA.  EKG without acute changes, has prolonged PR interval which is not new.  CXR without evidence of pneumonia or CHF.    CMP, CBC, TSH without abnormality.  Initial troponin negative at 4. D-Dimer and BNP pending.  Patient updated at bedside and doing well.  She takes flecanide 50mg   at night between 7 and 8 PM, therefore will give in ED.  BNP WNL at 56.7.  D-Dimer 0.63, age adjusts.  Will await second troponin.    Prior Zio patch in December with single PACs during events.  Second troponin from 4 to 7.  Paged on-call Cardiology fellow for [facility], Dr. Alfred Levins, who states that patient is okay for discharge, and to call Cardiology office in AM to make appointment.  Spoke with patient and son at bedside who agree to go home and call in AM to schedule appointment with cardiology.  Patient was discharged home with stable vital signs.   Final Clinical Impression(s) / ED Diagnoses Final diagnoses:  Primary hypertension  Palpitations    Rx / DC Orders ED Discharge Orders    None       Cleophas Dunker, DO 08/28/20 2047    Maudie Flakes, MD 08/29/20  [phone]

## 2020-08-28 NOTE — ED Notes (Signed)
Lab called to add on BNP at this time

## 2020-08-28 NOTE — ED Notes (Signed)
Patient and son given discharge paperwork and instructions. Verbalized understanding of teaching. IV d/c with cath tip intact. Wheeled to exit in NAD.

## 2020-08-28 NOTE — Discharge Instructions (Signed)
As we discussed, everything with your heart came back good.  We spoke with the cardiologist on-call, who stated that you should call Dr. Tanna Furry office first thing in the morning to schedule an appointment.  You should continue to take your medications as scheduled.  We will not make any changes, as your blood pressure fluctuates quite a lot.  Make sure you take your medications for your esophagus as well, as this could be contributing to your symptoms.  Come back if you have worsening of your symptoms, you have difficulty breathing, you have worsening chest pain.

## 2020-08-28 NOTE — ED Triage Notes (Signed)
Patient to ED from home via EMS for complaints of HTN. Per patient she has been having Bps at home in 200s/100s. Per EMS patient was 220/110 on arrival, and while en route she had a n episode of bradycardia, change of mental status, and BP dropped to 150s. Lasted 1-2 minutes then returned to baseline.

## 2020-09-24 ENCOUNTER — Ambulatory Visit (INDEPENDENT_AMBULATORY_CARE_PROVIDER_SITE_OTHER): Payer: Medicare Other | Admitting: Gastroenterology

## 2020-09-24 ENCOUNTER — Encounter: Payer: Self-pay | Admitting: Gastroenterology

## 2020-09-24 VITALS — BP 138/72 | HR 68 | Ht 65.0 in | Wt 126.8 lb

## 2020-09-24 DIAGNOSIS — K224 Dyskinesia of esophagus: Secondary | ICD-10-CM

## 2020-09-24 NOTE — Progress Notes (Signed)
Review of pertinent gastrointestinal problems: 1.  Dysphagia, weight loss: 12/2019: UGI series: Moderate presbyesophagus. Small prox duodenal tic. No HH or GERD. 12/2019 MBSS: limited due to pt reticence to participate.  "functional oropharyngeal swallow ability with no aspiration or deep laryngeal penetration of any consistency tested. Trace laryngeal penetration on swallow of thin via straw is [facility] for age. In addition, nopharyngeal residuals post-swallow. Minimal difficulty orally transiting pudding bolus with appearance of near gagging but with encouragement pt able to propel posterior and swallow. No oral issues with cracker, nectar or thin. SLP oral holding/near gagging with puree was anxiety driven." EGD September 2021 Dr. Tarri Glenn while in hospital showed a normal esophagus that was dilated and then biopsied, also showed mild gastropathy. The biopsies from these showed no clear sign of etiology.  High-res esophageal manometry December 2021, normal GE junction, + evidence of nutcracker esophagus.  Trial of nifedipine 10 mg capsules 1 cap 3 times daily.  This caused some lightheadedness, low blood pressure and so she has been relying on hyoscyamine, blender food.  HPI: This is a very pleasant 87 year old woman who is here with her son again today.  I last saw her here in the office about 2 months ago.  She was with her son.  She was actually doing quite well on sublingual hyoscyamine which she only needed to take once or twice weekly.  She also clearly modified her diet so that she is not as bothered by pain or dysphagia.  She was using blander food and drinking 4 or 5 ensures daily.  I suggested a trial of Carnation Instant Breakfast mixed with whole milk as it is very similar to Ensure quite a bit less expensive.  I also recommended trying to challenge her esophagus with solid food every few days.  She did continue to chew her food well and eat slowly.  Her weight is up 4 pounds since her last  office visit here 2 months ago.,  Same scale here in our office  She eats ice cream twice daily, she puts almost all of her solid food in a blender.  She does eat 1 piece of raisin toast and some vanilla wafers in the evening.  She drinks 5 ensures daily.  On this regimen she has gained 4 pounds in the last 2 months and is really not very bothered by dysphagia.  She continues to have intermittent palpitations.  She admits it is hard for her to know if this is from her heart or from esophageal spasms.  ROS: complete GI ROS as described in HPI, all other review negative.  Constitutional:  No unintentional weight loss   Past Medical History:  Diagnosis Date  . Anemia    iron def  . Anxiety   . Arrhythmia    paroxysmal SVT and symtomatic PAC's  . Diverticulosis    colon  . Elevated troponin   . GERD 04/02/2008   Qualifier: Diagnosis of  By: Jenny Reichmann MD, Hunt Oris   . History of colonic polyps    cecal  . Hypertension   . 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 Osteoporosis 07/21/2011   t score -2.7  . Palpitations 05/24/2017  . Stricture esophagus   . Vitamin D deficiency    2010    Past Surgical History:  Procedure Laterality Date  . A&P, enterocele repair     s/p 2003  . ABDOMINAL HYSTERECTOMY    . BIOPSY  02/05/2020   Procedure: BIOPSY;  Surgeon: Thornton Park, MD;  Location: [facility];  Service: Gastroenterology;;  . BREAST REDUCTION SURGERY     s/p 1996      PATIENT DENIES BREAST REDUCTION   . ESOPHAGEAL DILATION  02/05/2020   Procedure: ESOPHAGEAL DILATION;  Surgeon: Thornton Park, MD;  Location: [facility] ENDOSCOPY;  Service: Gastroenterology;;  . ESOPHAGEAL MANOMETRY N/A 05/07/2020   Procedure: ESOPHAGEAL MANOMETRY (EM);  Surgeon: Milus Banister, MD;  Location: [facility]OSCOPY;  Service: Endoscopy;  Laterality: N/A;  . ESOPHAGOGASTRODUODENOSCOPY (EGD) WITH PROPOFOL N/A 02/05/2020   Procedure:  ESOPHAGOGASTRODUODENOSCOPY (EGD) WITH PROPOFOL;  Surgeon: Thornton Park, MD;  Location: [facility];  Service: Gastroenterology;  Laterality: N/A;  . HEMORRHOID SURGERY    . LEFT HEART CATH AND CORONARY ANGIOGRAPHY N/A 05/25/2017   Procedure: LEFT HEART CATH AND CORONARY ANGIOGRAPHY;  Surgeon: Jettie Booze, MD;  Location: [facility];  Service: Cardiovascular;  Laterality: N/A;    Current Outpatient Medications  Medication Sig Dispense Refill  . AMBULATORY NON FORMULARY MEDICATION Peppermint altoids 1-2 tabs as needed    . aspirin EC 325 MG tablet Take 325 mg by mouth daily as needed (headache).    . bismuth subsalicylate (PEPTO BISMOL) 262 MG/15ML suspension Take 30 mLs by mouth as needed.    . flecainide (TAMBOCOR) 50 MG tablet TAKE 2 TABLETS EVERY MORNING AND 1 TABLET AT NIGHT 90 tablet 0  . Hyoscyamine Sulfate SL (LEVSIN/SL) 0.125 MG SUBL Take 1 to 2 sublingual tablets(under the tongue) as needed for spasms. 60 tablet 3  . LORazepam (ATIVAN) 0.5 MG tablet TAKE 1/2 (ONE-HALF) TABLET BY MOUTH ONCE DAILY AS NEEDED FOR ANXIETY 30 tablet 0  . metoprolol succinate (TOPROL XL) 25 MG 24 hr tablet May take 1/2 tablet by mouth daily for breakthrough palpitations. 45 tablet 3  . nitroGLYCERIN (NITROSTAT) 0.3 MG SL tablet Place 1 tablet (0.3 mg total) under the tongue every 5 (five) minutes as needed (esophageal pain. Do not take any more than 3 doses in 24 hours.). 100 tablet 3   No current facility-administered medications for this visit.    Allergies as of 09/24/2020 - Review Complete 09/24/2020  Allergen Reaction Noted  . Other Anaphylaxis 07/22/2017  . Pantoprazole Other (See Comments) 07/22/2017  . Zyrtec [cetirizine] Palpitations 04/11/2014  . Ciprofloxacin  03/09/2019  . Nifedipine  06/25/2020    Family History  Problem Relation Age of Onset  . Stroke Father        @ 15  . Hypertension Father   . Hypertension Sister   . Cancer Sister 60       brain  . Breast  cancer Other 43       breast  . Breast cancer Daughter 76       spread to her brain  . Cancer Other 58       colon/MELANOMA  . Stroke Mother        medication induced  . Kidney disease Brother   . Cancer Brother     Social History   Socioeconomic History  . Marital status: Widowed    Spouse name: Not on file  . Number of children: 2  . Years of education: Not on file  . Highest education level: Not on file  Occupational History  . Occupation: Retired  Tobacco Use  . Smoking status: Former Smoker    Quit date: 05/24/1968    Years since quitting: 52.3  . Smokeless tobacco: Never Used  . Tobacco comment:  only smoked 7 years  Vaping Use  . Vaping Use: Never used  Substance and Sexual Activity  . Alcohol use: No    Alcohol/week: 0.0 standard drinks  . Drug use: No  . Sexual activity: Never    Comment: 1st intercourse 68 yo-1 partner  Other Topics Concern  . Not on file  Social History Narrative  . Not on file   Social Determinants of Health   Financial Resource Strain: Low Risk   . Difficulty of Paying Living Expenses: Not very hard  Food Insecurity: No Food Insecurity  . Worried About Charity fundraiser in the Last Year: Never true  . Ran Out of Food in the Last Year: Never true  Transportation Needs: No Transportation Needs  . Lack of Transportation (Medical): No  . Lack of Transportation (Non-Medical): No  Physical Activity: Not on file  Stress: Not on file  Social Connections: Not on file  Intimate Partner Violence: Not on file     Physical Exam: BP 138/72   Pulse 68   Ht 5\' 5"  (1.651 m)   Wt 126 lb 12.8 oz (57.5 kg)   LMP 05/24/1970   SpO2 99%   BMI 21.10 kg/m  Constitutional: Generally frail Psychiatric: alert and oriented x3 Abdomen: soft, nontender, nondistended, no obvious ascites, no peritoneal signs, normal bowel sounds No peripheral edema noted in lower extremities  Assessment and plan: 87 y.o. female with nutcracker esophagus  She is  rarely having to use Levsin antispasmodic medicine any longer.  This is because of some significant dietary modifications including blood during her food, eating 5 ensures daily.  She also eats ice cream once or twice daily.  On this regimen it seems like she is quite adequately meeting her nutritional needs despite her esophageal motility issues.  Indeed she has gained 4 pounds in the past 2 months.  I recommended that she continue these changes as they seem to be helping and allowing her to put on some weight.  She knows to call here if she has any further questions or concerns.  Please see the "Patient Instructions" section for addition details about the plan.  Owens Loffler, MD [facility] 09/24/2020, 1:19 PM   Total time on date of encounter was 25 minutes (this included time spent preparing to see the patient reviewing records; obtaining and/or reviewing separately obtained history; performing a medically appropriate exam and/or evaluation; counseling and educating the patient and family if present; ordering medications, tests or procedures if applicable; and documenting clinical information in the health record).

## 2020-09-24 NOTE — Patient Instructions (Signed)
If you are age 85 or older, your body mass index should be between 23-30. Your Body mass index is 21.1 kg/m. If this is out of the aforementioned range listed, please consider follow up with your Primary Care Provider.   You will follow up with our office as needed.  Thank you for entrusting me with your care and choosing [facility].  Dr Ardis Hughs

## 2020-10-03 ENCOUNTER — Other Ambulatory Visit: Payer: Self-pay | Admitting: Internal Medicine

## 2020-10-16 ENCOUNTER — Telehealth: Payer: Self-pay | Admitting: Gastroenterology

## 2020-10-16 NOTE — Telephone Encounter (Signed)
Inbound call from patient. Have questions about medications would recommend for her spasms.  [phone]

## 2020-10-16 NOTE — Telephone Encounter (Signed)
Left message on machine to call back  

## 2020-10-16 NOTE — Telephone Encounter (Signed)
The pt has a history of nutcracker esophagus and she says that the esophageal spasms are very painful.  She has tried the levsin sl 0.125 mg two once daily with little relief. She would like to know if there is something else she can try. Please advise

## 2020-10-22 MED ORDER — HYOSCYAMINE SULFATE ER 0.375 MG PO TB12: 0.3750 mg | ORAL_TABLET | Freq: Two times a day (BID) | ORAL | 3 refills | Status: DC

## 2020-10-22 NOTE — Telephone Encounter (Signed)
Lets try Levbid 0.375 mg pills 1 pill twice daily dispense 60 with 3 refills.  This might be more effective than the sublingual formulation.  Thanks

## 2020-10-22 NOTE — Telephone Encounter (Signed)
The pt has been advised and the [facility] per [facility] request.

## 2020-10-24 ENCOUNTER — Ambulatory Visit (INDEPENDENT_AMBULATORY_CARE_PROVIDER_SITE_OTHER): Payer: Medicare Other | Admitting: Physician Assistant

## 2020-10-24 ENCOUNTER — Encounter: Payer: Self-pay | Admitting: Physician Assistant

## 2020-10-24 ENCOUNTER — Telehealth: Payer: Self-pay

## 2020-10-24 ENCOUNTER — Other Ambulatory Visit: Payer: Self-pay

## 2020-10-24 VITALS — BP 140/80 | HR 71 | Ht 65.5 in | Wt 124.4 lb

## 2020-10-24 DIAGNOSIS — I10 Essential (primary) hypertension: Secondary | ICD-10-CM | POA: Diagnosis not present

## 2020-10-24 DIAGNOSIS — I491 Atrial premature depolarization: Secondary | ICD-10-CM

## 2020-10-24 DIAGNOSIS — R0989 Other specified symptoms and signs involving the circulatory and respiratory systems: Secondary | ICD-10-CM

## 2020-10-24 DIAGNOSIS — R002 Palpitations: Secondary | ICD-10-CM

## 2020-10-24 MED ORDER — HYOSCYAMINE SULFATE ER 0.375 MG PO TBCR: EXTENDED_RELEASE_TABLET | ORAL | 3 refills | Status: DC

## 2020-10-24 NOTE — Telephone Encounter (Signed)
-----   Message from Milus Banister, MD sent at 10/24/2020  7:18 AM EDT ----- Regarding: RE: Bo Merino, that sounds like a good plan.  Symax duotab 1 to 2 pills every 12 hours.  Dispense 60 with 3 refills.  Thank you   ----- Message ----- From: Stevan Born, CMA Sent: 10/23/2020   4:22 PM EDT To: Milus Banister, MD Subject: [employer] will not cover Levbid ER tablets.  Insurance suggests alternative Symax SR tablets.  Is this OK to send?  Thank you Elmyra Ricks

## 2020-10-24 NOTE — Progress Notes (Signed)
Cardiology Office Note Date:  10/24/2020  Patient ID:  [name] [name], DOB 1929/12/19, MRN [id number] PCP:  [name], MD Electrophysiologist: Dr. Lovena Le                                                                                                      Chief Complaint:   S/p ER visit  History of Present Illness: [name] [name] is a 87 y.o. female with history of palpitations (PSVT is mentioned in her notes, though Dr. Tanna Furry note state no sustained arrhythmias and APC's treated with Flecainide), HTN.    Historically had been maintained on flecainide for years for symptomatic PACs/PVCs though during a hospital stay CP stopped and started on metoprolol that she was very intolerant of,  and Rx Rythmol but never started. At an office visit with myself in November 2017, noted with PVCs that she was feeling/very bothersome to her, and given negative stress test resumed on her flecainide.    She was seen in f/u by Dr. Lovena Le in Dec 2017 doing better with minimal palpitations, discussed adding metoprolol if more palpitations.  She saw Dr. Lovena Le in Sept 2017 for f/u, she was concerned/convinced she had drank a bottle of contaminated water and was having GI issues, her BP was elevated though thought a component of anxiety, doesn't look like she started the metoprolol, advised to minimize sodium, no symptoms of palpitations.  She was admitted to [facility] with c/o palpitations, noted to have elevated Trop, cath was done without CAD without clear reason for her trop, discharged 05/26/17 to f/u with EP for symptomatic PVCs   04/21/2020 phone note with c/o palpitations, Dr. Lovena Le recommended addition of PRN Toprol  PMD visit 12/2/1 mentions esophageal spasms, palpitations, to continue metoprolol 12.5mg  daily  I saw her 04/30/20 She is a little hard to follow today, skips around on different topics She c/w GI and reports following closely, planned for another esophageal dilation in the next week or two.  She  has lost quite a bit of weight. "I am a shell of my former self" and just can not eat, whenever she eats she gets terrible pain, She is eating pureed food only now and thankfully this is better and has started to be able to eat a little better, she was started on PRN s/l Hycosimine and this has been helpful at times. NO CP, denies SOB She had a questionable fainting episode in Oct, says she stood up to turn off the TV and felt dizzy and thinks she blacked out for a second, when she came to she was actively falling but unable to stop herself and hit her head on the floor, required stitches.  In review of the ER note, reports that she did not have a syncopal event. Generally feels weak and poorly, and atrriibutes the fall/faint and most of her symptoms to poor eating/intake. She had an episode of palpitations about 7-10 days ago, started "right as my flecainide pill hit my stomach", lasted hours, she wanted to try a second pill but though maybe the pill she had  just taken cause it. She was advised via RN that Dr. Lovena Le recommended she yse PRN metoprolol. She saw her PMD recently and her BP on the higher side and recommended discussing with [place] taking the metoprolol daily.  She does not want to do that, weary that her BP at time is already "really low". She brings a log of BPs/HRs, generally are OK   Her BP predominantly 120's-140's/70's-80's, some a bit higher, rarely noted markedly high reading HRs 70's usually, none <60 or as high as 90 We discussed daily use of metoprolol though despite much conversation she was not agreeable with concerns of making her BP too low.  Planned for ZIO to better evaluate what her palpitations are  Since then she has had further GI work up and 05/07/20 had EGD/manometry, noted: evidence of hypercontractile esophagus (Jackhammer esophagus), "consider CCB (nofedipine diltiazem) or 5 phospho diesterase inhibitor (sildenafil)"  She wore the monitor, pending MD/formal read I  have r,personally eviewed, patient triggered strips are SR with PACs One non-pt triggered appears a brief PAT 7.8 seconds, not clearly AF SVT/ectopics were <1%  Telephone note yesterday via GI, Dr. Ardis Hughs, started Nifedipine 10mg  TID  I saw her 06/04/20 She has ongoing palpitations that are driving her crazy, she does not though think they are her esophagus, but wanted to come to toady's visit prior to starting the nifedipine. She has been using Toprol daily for the last 3 days or so. Np CP, no near syncope or syncope.  No SOB. She says her PMD started her back on Ativan a month or so ago and thinks this may have helped some with her nerves, and shaky feeling. She again mentions that she just can not seem to eat much, though is trying concerned about her ongoing weight loss.  (up 2 lbs from her last visit). Monitor noted PACs She was encouraged to start the nifedipine as rx by her GI   She saw Dr. Lovena Le Feb 2022, much the same complaints, palpitations, poor PO intake, weight loss, felt palpitations were reasonably controlled no changes were made, noted intolerant of the nifedipine 2/2 low BP.  ER visit April with palps, CP, HTN, notes mention rpid changes in BP by EMS 200 SPB > 150's with some degree of mental status change?, syncope? Vague mention of bradycardia  EMS note: During transport pt started experiencing chest pain. EMT Coates informed Paramedic Ysidro Evert about the rest pain and that the pt had had a change in demeanor mid sentence. The ambulance was pulled over and Paramedic Ysidro Evert jumped in the back. Pt was noted to be more hypertensive and had become bradycardic. Pt was lethargic and slow to respond to EMS however still alert. A 12 lead was obtained and unremarkable. After apx 2-3 minutes pt was back at baseline and again denied chest pain. 208/102, 22/104 > 178/96 No documented bradycardia Tracings reviewed, SR, some PACs only  BNP 56 Dimer mildly elevated 0.63 TSH 2.093 HS  Trop 4> 7  TODAY She is pretty fatigued with the esophageal issues. Dr. Ardis Hughs is trying a new medicine and she is hopeful it will help. Feels weak all of the time, hates living of fuids/pureed foods.  She denies dizzy spells, no near syncope or syncope (sinec Oct of last year) There was mention of some transient bradycardia though no tracings for review, ?vagal She denies any CP, says her palpitations flare up on her but the as needed metoprolol settles them quickly and these are not her main conern.  Though when I ask why she called EMS she says for weeks of palpitations and concerns of fluctuating BP's She can get 170's/100 and 22min later back to "normal"  She denies SOB  Past Medical History:  Diagnosis Date  . Anemia    iron def  . Anxiety   . Arrhythmia    paroxysmal SVT and symtomatic PAC's  . Diverticulosis    colon  . Elevated troponin   . GERD 04/02/2008   Qualifier: Diagnosis of  By: Jenny Reichmann MD, Hunt Oris   . History of colonic polyps    cecal  . Hypertension   . 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 Osteoporosis 07/21/2011   t score -2.7  . Palpitations 05/24/2017  . Stricture esophagus   . Vitamin D deficiency    2010    Past Surgical History:  Procedure Laterality Date  . A&P, enterocele repair     s/p 2003  . ABDOMINAL HYSTERECTOMY    . BIOPSY  02/05/2020   Procedure: BIOPSY;  Surgeon: Thornton Park, MD;  Location: [facility];  Service: Gastroenterology;;  . BREAST REDUCTION SURGERY     s/p 1996      PATIENT DENIES BREAST REDUCTION   . ESOPHAGEAL DILATION  02/05/2020   Procedure: ESOPHAGEAL DILATION;  Surgeon: Thornton Park, MD;  Location: [facility];  Service: Gastroenterology;;  . ESOPHAGEAL MANOMETRY N/A 05/07/2020   Procedure: ESOPHAGEAL MANOMETRY (EM);  Surgeon: Milus Banister, MD;  Location: [facility];  Service: Endoscopy;  Laterality: N/A;  . ESOPHAGOGASTRODUODENOSCOPY (EGD)  WITH PROPOFOL N/A 02/05/2020   Procedure: ESOPHAGOGASTRODUODENOSCOPY (EGD) WITH PROPOFOL;  Surgeon: Thornton Park, MD;  Location: [facility];  Service: Gastroenterology;  Laterality: N/A;  . HEMORRHOID SURGERY    . LEFT HEART CATH AND CORONARY ANGIOGRAPHY N/A 05/25/2017   Procedure: LEFT HEART CATH AND CORONARY ANGIOGRAPHY;  Surgeon: Jettie Booze, MD;  Location: [facility];  Service: Cardiovascular;  Laterality: N/A;    Current Outpatient Medications  Medication Sig Dispense Refill  . AMBULATORY NON FORMULARY MEDICATION Peppermint altoids 1-2 tabs as needed    . aspirin EC 325 MG tablet Take 325 mg by mouth daily as needed (headache).    . bismuth subsalicylate (PEPTO BISMOL) 262 MG/15ML suspension Take 30 mLs by mouth as needed.    . flecainide (TAMBOCOR) 50 MG tablet TAKE 2 TABLETS EVERY MORNING AND 1 TABLET AT NIGHT 90 tablet 0  . hyoscyamine (LEVBID) 0.375 MG 12 hr tablet Take 1 tablet (0.375 mg total) by mouth 2 (two) times daily. 60 tablet 3  . Hyoscyamine Sulfate SL (LEVSIN/SL) 0.125 MG SUBL Take 1 to 2 sublingual tablets(under the tongue) as needed for spasms. 60 tablet 3  . LORazepam (ATIVAN) 0.5 MG tablet TAKE 1/2 (ONE-HALF) TABLET BY MOUTH ONCE DAILY AS NEEDED FOR ANXIETY 30 tablet 0  . metoprolol succinate (TOPROL XL) 25 MG 24 hr tablet May take 1/2 tablet by mouth daily for breakthrough palpitations. 45 tablet 3  . nitroGLYCERIN (NITROSTAT) 0.3 MG SL tablet Place 1 tablet (0.3 mg total) under the tongue every 5 (five) minutes as needed (esophageal pain. Do not take any more than 3 doses in 24 hours.). 100 tablet 3   No current facility-administered medications for this visit.    Allergies:   Other, Pantoprazole, Zyrtec [cetirizine], Ciprofloxacin, and Nifedipine   Social History:  The patient  reports that she quit smoking about 52 years ago. She has never  used smokeless tobacco. She reports that she does not drink alcohol and does not use drugs.   Family  History:  The patient's family history includes Breast cancer (age of onset: 63) in an other family member; Breast cancer (age of onset: 23) in her daughter; Cancer in her brother; Cancer (age of onset: 61) in an other family member; Cancer (age of onset: 17) in her sister; Hypertension in her father and sister; Kidney disease in her brother; Stroke in her father and mother.  ROS:  Please see the history of present illness.  All other systems are reviewed and otherwise negative.   PHYSICAL EXAM:  VS:  LMP 05/24/1970  BMI: There is no height or weight on file to calculate BMI. Well nourished, well developed, in no acute distress  HEENT: normocephalic, atraumatic  Neck: no JVD, carotid bruits or masses Cardiac: RRR; 1/6 SM, no extrasystoles, no rubs, or gallops Lungs:  CTA b/l, no wheezing, rhonchi or rales  Abd: soft, nontender MS: no deformity, age appropriate atrophy Ext: no edema, numerous spider veins Skin: warm and dry Neuro:  No gross deficits appreciated Psych: euthymic mood, full affect    EKG:  Done today and reviewed by myself  SR 71bpm, PACs, 1st degree AVblock, PR 254ms, QRS 62ms, QTc 479ms SR 60bm, PR 281ms, QRS 96ms, QTc 422ms Artifact, SR 70bpm, 1st degree AVblock, PR 274ms,, QRS 98, QTc 397 08/02/19: SR 78, borderline 1st degree AVNlock, PR 238ms, QRS 27ms, QTc44ms, no ST/T changes 05/24/17 SR 69bpm, measured PR 245ms, QRS 71ms, QTc 465ms 10/28/16: SR, 76bpm, borderline 1st degree AVBlock, PR 231ms, QRS 1ms, measured QT/QTc 333ms/405ms, not significantly changed from prior    05/25/17: LHC  Mid LAD lesion is 25% stenosed.  The left ventricular systolic function is normal.  LV end diastolic pressure is low.  The left ventricular ejection fraction is 55-65% by visual estimate.  There is no aortic valve stenosis. Nonobstructive coronary artery disease in the LAD.    05/24/17: TTE  Study Conclusions - Left ventricle: The cavity size was normal. Systolic function  was   vigorous. The estimated ejection fraction was in the range of 65%   to 70%. Wall motion was normal; there were no regional wall   motion abnormalities. Doppler parameters are consistent with   abnormal left ventricular relaxation (grade 1 diastolic   dysfunction). There was no evidence of elevated ventricular   filling pressure by Doppler parameters. - Aortic valve: There was no regurgitation. - Aortic root: The aortic root was normal in size. - Mitral valve: There was mild regurgitation. - Right ventricle: The cavity size was normal. Wall thickness was   normal. Systolic function was normal. - Right atrium: The atrium was normal in size. - Tricuspid valve: There was trivial regurgitation. - Pulmonary arteries: Systolic pressure was within the normal   range. - Inferior vena cava: The vessel was normal in size. - Pericardium, extracardiac: There was no pericardial effusion.    Recent Labs: 08/28/2020: ALT 14; B Natriuretic Peptide 56.7; BUN 13; Creatinine, Ser 0.72; Hemoglobin 13.0; Platelets 216; Potassium 4.0; Sodium 140; TSH 2.093  No results found for requested labs within last 8760 hours.   CrCl cannot be calculated (Patient's most recent lab result is older than the maximum 21 days allowed.).   Wt Readings from Last 3 Encounters:  09/24/20 126 lb 12.8 oz (57.5 kg)  08/28/20 116 lb (52.6 kg)  07/22/20 122 lb (55.3 kg)     Other studies reviewed: Additional studies/records  reviewed today include: summarized above  ASSESSMENT AND PLAN:  1. Palpitations, PVCs, hx of PACs     on flecainide, PRN metoprolol     Stable intervals      Prn metoprolol is being utilized, I think her BP could tolerate daily but she says no way that her BP gets too low   2. HTN      Looks OK      Labile of late, unclear why      No changes  3. Fall/syncope back in October 2021     Not recurrent     Some mention of transient bradycardia, no clear symptoms of bradycardia   Very much  bothered by her esophageal issues and working hard with Dr. Ardis Hughs on this I am not sure that we need to do differently from a cardiac perspective at this juncture   Disposition:  80mo RTC, sooner if needed    Haywood Lasso, PA-C 10/24/2020 5:22 AM     [facility] [address] [place] [address] [phone] (office)  [phone] (fax)

## 2020-10-24 NOTE — Patient Instructions (Signed)
Medication Instructions:  Your physician recommends that you continue on your current medications as directed. Please refer to the Current Medication list given to you today.   *If you need a refill on your cardiac medications before your next appointment, please call your pharmacy*   Lab Work: None ordered   If you have labs (blood work) drawn today and your tests are completely normal, you will receive your results only by: Marland Kitchen MyChart Message (if you have MyChart) OR . A paper copy in the mail If you have any lab test that is abnormal or we need to change your treatment, we will call you to review the results.   Testing/Procedures: None ordered    Follow-Up: At [facility], you and your health needs are our priority.  As part of our continuing mission to provide you with exceptional heart care, we have created designated Provider Care Teams.  These Care Teams include your primary Cardiologist (physician) and Advanced Practice Providers (APPs -  Physician Assistants and Nurse Practitioners) who all work together to provide you with the care you need, when you need it.  We recommend signing up for the patient portal called "MyChart".  Sign up information is provided on this After Visit Summary.  MyChart is used to connect with patients for Virtual Visits (Telemedicine).  Patients are able to view lab/test results, encounter notes, upcoming appointments, etc.  Non-urgent messages can be sent to your provider as well.   To learn more about what you can do with MyChart, go to [email or web address].    Your next appointment:   6 month(s)  The format for your next appointment:   In Person  Provider:    You may see Dr. Cristopher Peru     Other Instructions None

## 2020-10-24 NOTE — Telephone Encounter (Signed)
Symax duotab 0.375mg  sent to pharmacy as requested.

## 2020-11-26 ENCOUNTER — Other Ambulatory Visit: Payer: Self-pay | Admitting: Internal Medicine

## 2020-12-06 DIAGNOSIS — Z23 Encounter for immunization: Secondary | ICD-10-CM | POA: Diagnosis not present

## 2020-12-08 ENCOUNTER — Telehealth: Payer: Self-pay

## 2020-12-08 NOTE — Telephone Encounter (Signed)
Requesting to speak with a nurse about leg and ankle swelling. Please call pt back.

## 2020-12-08 NOTE — Telephone Encounter (Signed)
Return pt's call. Stated she has been eating [employer] vegetarian soup which has 28 % sodium x 6 months. But noticed the swelling about 2 weeks ago. Also has hx of varicose veins. Informed I'm unsure what could be causing the swelling and she will need an appt to be evaluated; she's agreeable. Stated she's unable to come on Wed/Thurs b/c her son has to bring her. Call transferred to front office -  no appts available on red team; appt schedule with Dr Johnney Ou on Friday 7/22.

## 2020-12-12 ENCOUNTER — Other Ambulatory Visit: Payer: Self-pay

## 2020-12-12 ENCOUNTER — Encounter: Payer: Self-pay | Admitting: Student

## 2020-12-12 ENCOUNTER — Ambulatory Visit (INDEPENDENT_AMBULATORY_CARE_PROVIDER_SITE_OTHER): Payer: Medicare Other | Admitting: Student

## 2020-12-12 VITALS — BP 164/68 | HR 75 | Ht 65.5 in | Wt 128.7 lb

## 2020-12-12 DIAGNOSIS — I10 Essential (primary) hypertension: Secondary | ICD-10-CM | POA: Diagnosis not present

## 2020-12-12 DIAGNOSIS — R2241 Localized swelling, mass and lump, right lower limb: Secondary | ICD-10-CM | POA: Diagnosis not present

## 2020-12-12 NOTE — Assessment & Plan Note (Addendum)
Assessment: Patient presented with localized swelling of right lower extremity around the right ankle.  Patient has point tenderness on the medial aspect of the right gastrocnemius.  Point-of-care ultrasound performed, superficial veins were compressible.  Do not suspect SVT is present.  However cannot rule out DVT,  Patient will need bilateral lower extremity ultrasounds.  She denies any long car rides recently or trauma to the area.  I was unable to palpate her dorsalis pedis pulses and so bedside Doppler was used.  Both pulses were found using Doppler, the left being stronger than the right.  We will have patient follow-up with vascular surgeon as she has not seen them in a year for chronic varicose veins and possible worsening of arterial disease.  ABIs were performed at bedside, patient with normal left and right ABIs  Plan: -Bilateral lower extremity ultrasounds ordered -Patient to call vascular surgery  Addendum: Lower extremity ultrasounds negative for DVT or other pathological process.

## 2020-12-12 NOTE — Assessment & Plan Note (Signed)
Assessment: No visit today blood pressure 164/68.  Patient states that this is not consistent with home readings.  She states that the highest blood pressure she has at home is in the 140s over 70s.  Patient was given blood pressure log and instructed to follow-up with her PCP in 1 month and bring back the log to discuss blood pressure medication regimen.  Current regimen of metoprolol succinate 25 mg daily  Plan: -To record blood pressures and bring in log at next visit with PCP -Continue metoprolol succinate 25 mg daily

## 2020-12-12 NOTE — Progress Notes (Signed)
CC: Right lower leg swelling  HPI:  [name] [name] is a 87 y.o. female with a past medical history stated below and presents today for right lower leg swelling. Please see problem based assessment and plan for additional details.  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     bismuth subsalicylate (PEPTO BISMOL) 262 MG/15ML suspension Take 30 mLs by mouth as needed.     flecainide (TAMBOCOR) 50 MG tablet TAKE 2 TABLETS EVERY MORNING AND 1 TABLET AT NIGHT 90 tablet 0   Hyoscyamine Sulfate 0.375 MG TBCR Take 1 to 2 tablets every 12 hours 60 tablet 3   LORazepam (ATIVAN) 0.5 MG tablet TAKE 1/2 (ONE-HALF) TABLET BY MOUTH ONCE DAILY AS NEEDED FOR ANXIETY 30 tablet 0   metoprolol succinate (TOPROL XL) 25 MG 24 hr tablet May take 1/2 tablet by mouth daily for breakthrough palpitations. 45 tablet 3   nitroGLYCERIN (NITROSTAT) 0.3 MG SL tablet Place 1 tablet (0.3 mg total) under the tongue every 5 (five) minutes as needed (esophageal pain. Do not take any more than 3 doses in 24 hours.). 100 tablet 3   No current facility-administered medications on file prior to visit.    Family History  Problem Relation Age of Onset   Stroke Father        @ 49   Hypertension Father     Hypertension Sister    Cancer Sister 31       brain   Breast cancer Other 45       breast   Breast cancer Daughter 80       spread to her brain   Cancer Other 75       colon/MELANOMA   Stroke Mother        medication induced   Kidney disease Brother    Cancer Brother     Social History   Socioeconomic History   Marital status: Widowed    Spouse name: Not on file   Number of children: 2   Years of education: Not on file   Highest education level: Not on file  Occupational History   Occupation: Retired  Tobacco Use   Smoking status: Former    Types: Cigarettes    Quit date: 05/24/1968    Years since quitting: 52.5   Smokeless tobacco: Never   Tobacco comments:    only smoked 7 years  Vaping Use   Vaping Use: Never used  Substance and Sexual Activity   Alcohol use: No    Alcohol/week: 0.0 standard drinks   Drug use: No   Sexual activity: Never  Comment: 1st intercourse 58 yo-1 partner  Other Topics Concern   Not on file  Social History Narrative   Not on file   Social Determinants of Health   Financial Resource Strain: Low Risk    Difficulty of Paying Living Expenses: Not very hard  Food Insecurity: No Food Insecurity   Worried About Charity fundraiser in the Last Year: Never true   Ran Out of Food in the Last Year: Never true  Transportation Needs: No Transportation Needs   Lack of Transportation (Medical): No   Lack of Transportation (Non-Medical): No  Physical Activity: Not on file  Stress: Not on file  Social Connections: Not on file  Intimate Partner Violence: Not on file   Review of Systems: ROS negative except for what is noted on the assessment and plan.  Vitals:   12/12/20 1101  BP: (!) 164/68  Pulse: 75  SpO2: 91%  Weight: 128 lb 11.2 oz (58.4 kg)  Height: 5' 5.5" (1.664 m)   Physical Exam: Constitutional: Well-appearing female, no acute distress HENT: normocephalic atraumatic, mucous membranes moist Eyes: conjunctiva  non-erythematous Neck: supple Cardiovascular: regular rate and rhythm, no m/r/g.  Difficult to palpate dorsalis pedis pulses.  Telangiectasias of the lower extremities Pulmonary/Chest: normal work of breathing on room air, lungs clear to auscultation bilaterally Abdominal: soft, non-tender, non-distended MSK: normal bulk and tone.  Medial aspect of right gastroc tender to palpate Neurological: alert & oriented x 3 Skin: warm and dry.  Extremities consistent with vascular disease Psych: Normal mood and thought process  Assessment & Plan:   See Encounters Tab for problem based charting.  Patient discussed with Dr. Donnita Falls, D.O. [facility], PGY-2 Pager: [phone], Phone: [phone] Date 12/12/2020 Time 5:51 PM

## 2020-12-12 NOTE — Patient Instructions (Signed)
Thank you, [name] [name] for allowing [place] to provide your care today. Today we discussed   Swelling of your right lower leg We will be ordering you an ultrasound to check the veins in your leg. I would also like you to call your vascular surgeon and schedule a follow up appointment.    I have ordered the following labs for you:  Lab Orders  No laboratory test(s) ordered today      Referrals ordered today:   Referral Orders  No referral(s) requested today     I have ordered the following medication/changed the following medications:   Stop the following medications: There are no discontinued medications.   Start the following medications: No orders of the defined types were placed in this encounter.    Follow up:  1 month with your PCP      Should you have any questions or concerns please call the internal medicine clinic at [phone].     Sanjuana Letters, D.O. [place]

## 2020-12-19 ENCOUNTER — Ambulatory Visit (HOSPITAL_COMMUNITY): Payer: Medicare Other

## 2020-12-22 NOTE — Progress Notes (Signed)
Internal Medicine Clinic Attending  I saw and evaluated the patient.  I personally confirmed the key portions of the history and exam documented by Dr. Katsadouros and I reviewed pertinent patient test results.  The assessment, diagnosis, and plan were formulated together and I agree with the documentation in the resident's note.  

## 2020-12-26 ENCOUNTER — Other Ambulatory Visit: Payer: Self-pay

## 2020-12-26 ENCOUNTER — Ambulatory Visit (HOSPITAL_COMMUNITY)
Admission: RE | Admit: 2020-12-26 | Discharge: 2020-12-26 | Disposition: A | Discharge status: Home / Self Care | Payer: Medicare Other | Source: Ambulatory Visit | Attending: Internal Medicine | Admitting: Internal Medicine

## 2020-12-26 DIAGNOSIS — R2241 Localized swelling, mass and lump, right lower limb: Secondary | ICD-10-CM | POA: Diagnosis not present

## 2021-01-12 ENCOUNTER — Ambulatory Visit (INDEPENDENT_AMBULATORY_CARE_PROVIDER_SITE_OTHER): Payer: Medicare Other | Admitting: Internal Medicine

## 2021-01-12 ENCOUNTER — Encounter: Payer: Self-pay | Admitting: Internal Medicine

## 2021-01-12 ENCOUNTER — Other Ambulatory Visit: Payer: Self-pay

## 2021-01-12 VITALS — BP 135/64 | HR 76 | Temp 97.8°F | Resp 20 | Ht 65.5 in | Wt 130.4 lb

## 2021-01-12 DIAGNOSIS — I10 Essential (primary) hypertension: Secondary | ICD-10-CM

## 2021-01-12 DIAGNOSIS — F419 Anxiety disorder, unspecified: Secondary | ICD-10-CM | POA: Diagnosis not present

## 2021-01-12 DIAGNOSIS — M810 Age-related osteoporosis without current pathological fracture: Secondary | ICD-10-CM

## 2021-01-12 DIAGNOSIS — I872 Venous insufficiency (chronic) (peripheral): Secondary | ICD-10-CM

## 2021-01-12 MED ORDER — BUSPIRONE HCL 5 MG PO TABS: 5.0000 mg | ORAL_TABLET | Freq: Two times a day (BID) | ORAL | 2 refills | Status: DC

## 2021-01-12 NOTE — Progress Notes (Signed)
CC: HTN  HPI:  [name] is a 87 y.o. female with a past medical history stated below and presents today for HTN. Please see problem based assessment and plan for additional details.  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     flecainide (TAMBOCOR) 50 MG tablet TAKE 2 TABLETS EVERY MORNING AND 1 TABLET AT NIGHT 90 tablet 0   Hyoscyamine Sulfate 0.375 MG TBCR Take 1 to 2 tablets every 12 hours 60 tablet 3   metoprolol succinate (TOPROL XL) 25 MG 24 hr tablet May take 1/2 tablet by mouth daily for breakthrough palpitations. 45 tablet 3   nitroGLYCERIN (NITROSTAT) 0.3 MG SL tablet Place 1 tablet (0.3 mg total) under the tongue every 5 (five) minutes as needed (esophageal pain. Do not take any more than 3 doses in 24 hours.). 100 tablet 3   No current facility-administered medications on file prior to visit.    Family History  Problem Relation Age of Onset   Stroke Father        @ 24   Hypertension Father    Hypertension Sister    Cancer Sister 14       brain   Breast cancer Other 43       breast   Breast cancer Daughter 3       spread to her brain   Cancer Other 64       colon/MELANOMA   Stroke Mother        medication induced   Kidney  disease Brother    Cancer Brother     Social History   Socioeconomic History   Marital status: Widowed    Spouse name: Not on file   Number of children: 2   Years of education: Not on file   Highest education level: Not on file  Occupational History   Occupation: Retired  Tobacco Use   Smoking status: Former    Types: Cigarettes    Quit date: 05/24/1968    Years since quitting: 52.6   Smokeless tobacco: Never   Tobacco comments:    only smoked 7 years  Vaping Use   Vaping Use: Never used  Substance and Sexual Activity   Alcohol use: No    Alcohol/week: 0.0 standard drinks   Drug use: No   Sexual activity: Never    Comment: 1st intercourse 24 yo-1 partner  Other Topics Concern   Not on file  Social History Narrative   Not on file   Social Determinants of Health   Financial Resource Strain: Low Risk    Difficulty of Paying  Living Expenses: Not very hard  Food Insecurity: No Food Insecurity   Worried About [place] in the Last Year: Never true   Ran Out of Food in the Last Year: Never true  Transportation Needs: No Transportation Needs   Lack of Transportation (Medical): No   Lack of Transportation (Non-Medical): No  Physical Activity: Not on file  Stress: Not on file  Social Connections: Not on file  Intimate Partner Violence: Not on file    Review of Systems: ROS negative except for what is noted on the assessment and plan.  Vitals:   01/12/21 1313 01/12/21 1319  BP: (!) 165/79 135/64  Pulse: 75 76  Resp: 20   Temp: 97.8 F (36.6 C)   TempSrc: Oral   SpO2: 99%   Weight: 130 lb 6.4 oz (59.1 kg)   Height: 5' 5.5" (1.664 m)     Physical Exam: Gen: A&O x3 and in no apparent distress, well appearing and nourished. Neck: no obvious masses or nodules, AROM intact. CV: RRR, no murmurs, rubs, or gallops. S1/S2 presents, 1-2+ lower extremity pitting edema bialterally Resp: Clear to ascultation bilaterally  MSK: Grossly normal AROM and strength x4  extremities. Skin: good skin turgor, no rashes, unusual bruising, or prominent lesions.  Neuro: No focal deficits, grossly normal sensation and coordination.  Psych: Oriented x3 and responding appropriately. Intact recent and remote memory, normal mood, judgement, affect , and insight.    Assessment & Plan:   See Encounters Tab for problem based charting.  Patient discussed with Dr. Lars Mage, D.O. [facility], PGY-3 Pager: [phone], Phone: [phone] Date 01/13/2021 Time 7:23 AM

## 2021-01-12 NOTE — Patient Instructions (Signed)
Thank you, [name] for allowing [place] to provide your care today. Today we discussed anxiety, leg swelling, blood pressure.    Labs Ordered: Lab Orders  No laboratory test(s) ordered today     Tests Ordered: No orders of the defined types were placed in this encounter.     Referrals Ordered:  Referral Orders  No referral(s) requested today     Medication Changes:  Medications Discontinued During This Encounter  Medication Reason   aspirin EC 325 MG tablet    LORazepam (ATIVAN) 0.5 MG tablet Change in therapy     Meds ordered this encounter  Medications   busPIRone (BUSPAR) 5 MG tablet    Sig: Take 1 tablet (5 mg total) by mouth 2 (two) times daily.    Dispense:  180 tablet    Refill:  2     Health Maintenance Screening: There are no preventive care reminders to display for this patient.   Instructions:  - Lower extremity compression stockings (15-20 mm Hg)  Follow up: 6 months   Remember: If you have any questions or concerns, call our clinic at [phone] or after hours call [phone] and ask for the internal medicine resident on call.  [name], D.O. [place]

## 2021-01-13 ENCOUNTER — Encounter: Payer: Self-pay | Admitting: Internal Medicine

## 2021-01-13 NOTE — Assessment & Plan Note (Signed)
Presents for reevaluation of her blood pressure.  Pressure today is 135/64 on metoprolol 25 mg daily.  She states that she is tolerating this medication well without any side effects.   Plan: -Continue antihypertensive medication.

## 2021-01-13 NOTE — Assessment & Plan Note (Signed)
Patient presents for further evaluation management of her anxiety medications.  She has been on lorazepam in the past for general anxiety with panic attacks.  Patient states that she been off of this medication for at least 10 days, but then states she took 1 0.5 mg pill yesterday.  I spent the majority of my time counseling her regarding the risks and benefits of benzodiazepine medications in elderly patients.  I recommended switching to an alternative medication that could help her symptoms more consistently with hopefully avoiding the risks of short acting medications like lorazepam.  Patient agrees to this plan.  Plan: -Discontinue lorazepam -Start buspirone 5 mg twice daily -Follow-up in 1 month for assessment of her symptoms.

## 2021-01-13 NOTE — Assessment & Plan Note (Signed)
Patient continues to have lower extremity swelling.  Her swelling improved with elevation of her legs.  She has had recent venous ultrasounds that were negative for DVT.  Additionally she had screening ABIs that did not show any evidence of PAD.  Plan: -Recommended compression stockings with 15-20 mmHg

## 2021-01-13 NOTE — Assessment & Plan Note (Signed)
Patient apparently has a history of osteoporosis as well as vitamin D deficiency. She had a DEXA 06/2011 with a T score of -2.7. She completed a course of bisphosphates at that time.  She previously had a vitamin D level of 21 that improved to 33 but has not been checked in several years. She will need her vitamin D checked at her next visit.

## 2021-01-15 NOTE — Progress Notes (Signed)
Internal Medicine Clinic Attending  Case discussed with Dr. Coe  At the time of the visit.  We reviewed the resident's history and exam and pertinent patient test results.  I agree with the assessment, diagnosis, and plan of care documented in the resident's note.  

## 2021-02-06 ENCOUNTER — Other Ambulatory Visit: Payer: Self-pay | Admitting: Student

## 2021-02-09 ENCOUNTER — Other Ambulatory Visit: Payer: Self-pay | Admitting: Student

## 2021-02-11 NOTE — Telephone Encounter (Signed)
Not on current medication list.   

## 2021-02-12 ENCOUNTER — Ambulatory Visit (INDEPENDENT_AMBULATORY_CARE_PROVIDER_SITE_OTHER): Payer: Medicare Other | Admitting: Internal Medicine

## 2021-02-12 ENCOUNTER — Encounter: Payer: Self-pay | Admitting: Internal Medicine

## 2021-02-12 ENCOUNTER — Other Ambulatory Visit: Payer: Self-pay | Admitting: Student

## 2021-02-12 DIAGNOSIS — Z79899 Other long term (current) drug therapy: Secondary | ICD-10-CM | POA: Diagnosis not present

## 2021-02-12 DIAGNOSIS — F419 Anxiety disorder, unspecified: Secondary | ICD-10-CM | POA: Diagnosis not present

## 2021-02-12 DIAGNOSIS — R61 Generalized hyperhidrosis: Secondary | ICD-10-CM

## 2021-02-12 NOTE — Assessment & Plan Note (Signed)
[name] [name] states that she is doing well.  She has not started her BuSpar 5 mg twice daily, but has only had to use one half tab of her lorazepam 3 times and speaking with Dr. [name] for her last telehealth visit.  She states that she is under more pressure than usual because she is trying to sell her house is too large, and is looking to move into an apartment. She additionally states that she has been taking this medication for very long time, and it eases her anxiety.  We further discussed starting her BuSpar will act as a baseline medication for her anxiety, which should help decrease her episodes.  Patient is agreement to start her BuSpar 5 mg twice daily today. - Start BuSpar 5 mg twice daily

## 2021-02-12 NOTE — Progress Notes (Signed)
  [facility] Internal Medicine Residency Telephone Encounter Continuity Care Appointment  HPI:  This telephone encounter was created for [name] on 02/12/2021 for the following purpose/cc Follow Up.   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 Negative for chills, fever, malaise/fatigue and weight loss.  HENT:  Negative for congestion, nosebleeds, sinus pain and sore throat.   Respiratory:  Negative for stridor.   Gastrointestinal:  Negative for abdominal pain, constipation, diarrhea, nausea and vomiting.  Musculoskeletal:  Negative for back pain, joint pain, myalgias and neck pain.  Skin:  Negative for itching and rash.  Neurological:  Negative for dizziness.    Assessment / Plan / Recommendations:  Please see A&P under problem oriented charting for assessment of the patient's acute and chronic medical conditions.  As always, pt is advised that if symptoms worsen or new symptoms arise, they should go to an urgent care facility or to to ER for further evaluation.   Consent and Medical Decision Making:  Patient discussed with Dr. Evette Doffing This is a telephone encounter between [facility] and [name] on 02/12/2021 for Medication follow up. The visit was conducted with the patient located at home and [name] at [facility]. The patient's identity was confirmed using their DOB and current address. The patient has  consented to being evaluated through a telephone encounter and understands the associated risks (an examination cannot be done and the patient may need to come in for an appointment) / benefits (allows the patient to remain at home, decreasing exposure to coronavirus). I personally spent 13 minutes on medical discussion.

## 2021-02-12 NOTE — Assessment & Plan Note (Signed)
Patient presents for follow-up on her new depression medication, but has an additional concern for new night sweats that have lasted approximately 4 to 6 weeks.  She states that when she wakes up she is "drenched in sweat from the arms up," and her hair is "soaked."  She states that she has not had this occurrence since she "transition" during menopause.  She denies any fevers, weight loss (although she states she does not have much to lose to begin with), or signs of infection.  She would like to start hormone therapy today.  Assessment: Patient presents with 6 weeks of severe night sweats requesting him replacement therapy today.  Had a further discussion with patient of etiologies possible with night sweats, that it would be advisable to present to the clinic for further evaluation of blood work.  Patient is agreeable at this time. - Patient to follow-up in 2 weeks - Obtain CBC with differential, iron panel, TSH

## 2021-02-16 ENCOUNTER — Other Ambulatory Visit: Payer: Self-pay

## 2021-02-16 NOTE — Telephone Encounter (Signed)
Refilled 02/12/21 but as "Print"; re-send electronically. Thanks

## 2021-02-16 NOTE — Telephone Encounter (Signed)
error 

## 2021-02-17 NOTE — Telephone Encounter (Signed)
Look like plan on 8/22 was to discontinue ativan in favor of buspar.

## 2021-02-19 ENCOUNTER — Encounter: Payer: Self-pay | Admitting: Internal Medicine

## 2021-02-19 ENCOUNTER — Ambulatory Visit (INDEPENDENT_AMBULATORY_CARE_PROVIDER_SITE_OTHER): Payer: Medicare Other | Admitting: Internal Medicine

## 2021-02-19 ENCOUNTER — Other Ambulatory Visit: Payer: Self-pay

## 2021-02-19 ENCOUNTER — Telehealth: Payer: Self-pay | Admitting: Internal Medicine

## 2021-02-19 VITALS — BP 141/77 | HR 77 | Temp 97.9°F | Resp 32 | Ht 65.5 in | Wt 131.3 lb

## 2021-02-19 DIAGNOSIS — R61 Generalized hyperhidrosis: Secondary | ICD-10-CM | POA: Diagnosis not present

## 2021-02-19 DIAGNOSIS — F419 Anxiety disorder, unspecified: Secondary | ICD-10-CM

## 2021-02-19 DIAGNOSIS — K224 Dyskinesia of esophagus: Secondary | ICD-10-CM

## 2021-02-19 DIAGNOSIS — R300 Dysuria: Secondary | ICD-10-CM

## 2021-02-19 LAB — POCT URINALYSIS DIPSTICK
Bilirubin, UA: NEGATIVE
Glucose, UA: NEGATIVE
Ketones, UA: NEGATIVE
Leukocytes, UA: NEGATIVE
Nitrite, UA: NEGATIVE
Protein, UA: NEGATIVE
Spec Grav, UA: 1.01 (ref 1.010–1.025)
Urobilinogen, UA: 0.2 E.U./dL
pH, UA: 6.5 (ref 5.0–8.0)

## 2021-02-19 MED ORDER — NYSTATIN 100000 UNIT/GM EX CREA: 1.0000 "application " | TOPICAL_CREAM | Freq: Two times a day (BID) | CUTANEOUS | 0 refills | Status: DC

## 2021-02-19 NOTE — Patient Instructions (Addendum)
[name] [name],   It was a pleasure meeting you today. Today we discussed your urinary issues and night sweats.   For your urinary issues I have refilled your nystatin as your current tube is expired. Please use twice daily until symptoms are relieved.   For your night sweats we will draw some labs today to rule out common causes for night sweats.   I will have you follow back with your primary care provider, D. Coe.  [name]

## 2021-02-19 NOTE — Telephone Encounter (Signed)
I have attempted to reach out to [name] [name] regarding her recent refill request for ativan. Based on our last clinic visit, we decided to discontinue to ativan in favor of starting Buspiron. Therefore, I will deny her refill request. I attempted to call her to explain this information, but there was no answer. She has an appointment scheduled for today with Dr. Gilford Rile for further evaluation and management.   Lawerance Cruel, D.O.  Internal Medicine Resident, PGY-3 [facility]  Pager: [phone] 10:18 AM, 02/19/2021   **Please contact the on call pager after 5 pm and on weekends at [phone].**

## 2021-02-20 LAB — CBC WITH DIFFERENTIAL/PLATELET
Basophils Absolute: 0 10*3/uL (ref 0.0–0.2)
Basos: 1 %
EOS (ABSOLUTE): 0 10*3/uL (ref 0.0–0.4)
Eos: 1 %
Hematocrit: 41.2 % (ref 34.0–46.6)
Hemoglobin: 13.7 g/dL (ref 11.1–15.9)
Immature Grans (Abs): 0 10*3/uL (ref 0.0–0.1)
Immature Granulocytes: 0 %
Lymphocytes Absolute: 1.5 10*3/uL (ref 0.7–3.1)
Lymphs: 35 %
MCH: 30.9 pg (ref 26.6–33.0)
MCHC: 33.3 g/dL (ref 31.5–35.7)
MCV: 93 fL (ref 79–97)
Monocytes Absolute: 0.5 10*3/uL (ref 0.1–0.9)
Monocytes: 10 %
Neutrophils Absolute: 2.3 10*3/uL (ref 1.4–7.0)
Neutrophils: 53 %
Platelets: 260 10*3/uL (ref 150–450)
RBC: 4.43 x10E6/uL (ref 3.77–5.28)
RDW: 12.2 % (ref 11.7–15.4)
WBC: 4.3 10*3/uL (ref 3.4–10.8)

## 2021-02-20 LAB — CMP14 + ANION GAP
ALT: 9 IU/L (ref 0–32)
AST: 21 IU/L (ref 0–40)
Albumin/Globulin Ratio: 1.9 (ref 1.2–2.2)
Albumin: 4.7 g/dL — ABNORMAL HIGH (ref 3.6–4.6)
Alkaline Phosphatase: 88 IU/L (ref 44–121)
Anion Gap: 16 mmol/L (ref 10.0–18.0)
BUN/Creatinine Ratio: 29 — ABNORMAL HIGH (ref 12–28)
BUN: 19 mg/dL (ref 8–27)
Bilirubin Total: 0.2 mg/dL (ref 0.0–1.2)
CO2: 24 mmol/L (ref 20–29)
Calcium: 9.5 mg/dL (ref 8.7–10.3)
Chloride: 101 mmol/L (ref 96–106)
Creatinine, Ser: 0.66 mg/dL (ref 0.57–1.00)
Globulin, Total: 2.5 g/dL (ref 1.5–4.5)
Glucose: 73 mg/dL (ref 70–99)
Potassium: 4.5 mmol/L (ref 3.5–5.2)
Sodium: 141 mmol/L (ref 134–144)
Total Protein: 7.2 g/dL (ref 6.0–8.5)
eGFR: 84 mL/min/{1.73_m2} (ref >59)

## 2021-02-20 LAB — TSH: TSH: 3.19 u[IU]/mL (ref 0.450–4.500)

## 2021-02-20 NOTE — Assessment & Plan Note (Addendum)
Patient presents with symptoms of Dysuria for the past 4 days. She states that over the past 48 hours she has used nystatin cream for her symptoms with alleviation of her symptoms.   A/P:  Patient presents to the clinic for dysuria which resolved after nystatin cream use. Urinalysis was reassuring at today's visit. She is using nystatin that expired in 2019, will reorder it today.  - Reassuring urinalysis, trace lysed blood.  - Refill nystatin

## 2021-02-20 NOTE — Assessment & Plan Note (Signed)
At the end of the visit, as the patient was leaving, she stated that is not taking the Buspar. We had a brief discussion that she should start Buspar for her anxiety, and patient stated that she will think about it.  - Continue to discuss at follow up visit.  - Dr. [name] has discontinued Ativan, please do not refill.

## 2021-02-20 NOTE — Progress Notes (Signed)
   CC: Dysuria  HPI:  [name] is a 87 y.o. person, with a PMH noted below, who presents to the clinic dysuria. To see the management of their acute and chronic conditions, please see the A&P note under the Encounters tab.   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  Cardiovascular:  Negative for chest pain, palpitations, orthopnea, claudication and leg swelling.  Gastrointestinal:  Negative for abdominal pain, constipation, diarrhea, nausea and vomiting.  Genitourinary:  Positive for dysuria. Negative for frequency, hematuria and urgency.    Physical Exam:  Vitals:   02/19/21 1601 02/19/21 1621  BP: (!) 155/75 (!) 141/77  Pulse: 76 77  Resp: (!) 32   Temp: 97.9 F (36.6 C)   TempSrc: Oral   SpO2: 100%   Weight: 131 lb 4.8 oz (59.6 kg)   Height: 5' 5.5" (1.664 m)    Physical Exam Constitutional:      General: She is not in acute distress.    Appearance: Normal appearance. She is obese. She is not ill-appearing, toxic-appearing or diaphoretic.  HENT:     Head: Normocephalic and atraumatic.  Cardiovascular:     Rate and Rhythm: Normal rate and regular rhythm.     Pulses: Normal pulses.     Heart sounds: Normal heart sounds. No murmur heard.   No friction rub. No gallop.  Pulmonary:     Effort: Pulmonary effort is normal.     Breath sounds: Normal breath sounds. No wheezing,  rhonchi or rales.  Musculoskeletal:     Right lower leg: No edema.     Left lower leg: No edema.  Neurological:     Mental Status: She is alert and oriented to person, place, and time.  Psychiatric:        Mood and Affect: Mood normal.        Behavior: Behavior normal.     Assessment & Plan:   See Encounters Tab for problem based charting.  Patient discussed with Dr. Evette Doffing

## 2021-02-20 NOTE — Assessment & Plan Note (Addendum)
Patient presents for follow up for her night sweats. She states that she has continued to have these sweats nightly and wakes up in damp clothes and sheets. These episodes have occurred now for 6-8 weeks. She denies having any associated fevers, nausea, vomiting, chest pain, dyspnea, abdominal pain, and is having normal bowel movements so infection is currently low on the differential. She has not had any weight loss on review of her weights today she has progressively gained weight. She is a former smoker, but quit in 1970. She has not had any changes in diet, and she mostly eats a liquid diet 2/2 to esophageal spasms.   On medication review for symptoms, She is on buspirone, but has not started this medication yet. She is additionally on metoprolol, which can cause sweating, but this has been a chronic medication.   Physical examination is unremarkable at today's visit. Will rule out basic endocrine etiologies, and will order labs to rule out infectious etiology as well. No current indication for imaging at this time, and will follow up based on lab results.  - TSH, CBC w/diff, CMP  Addendum: Patient with results which are reassuring.  Patient had additional concerns for esophageal nutcracker syndrome, and would like to have a second opinion, she request a referral to [facility] as there are physicians that specialize in this process. - Referral to gastroenterology to [facility] for second opinion

## 2021-02-27 NOTE — Progress Notes (Signed)
Internal Medicine Clinic Attending ? ?Case discussed with Dr. Winters  At the time of the visit.  We reviewed the resident?s history and exam and pertinent patient test results.  I agree with the assessment, diagnosis, and plan of care documented in the resident?s note.  ?

## 2021-03-05 ENCOUNTER — Encounter: Payer: Self-pay | Admitting: Internal Medicine

## 2021-03-05 NOTE — Addendum Note (Signed)
Addended by: [name] C on: 03/05/2021 03:21 PM   Modules accepted: Orders

## 2021-03-12 ENCOUNTER — Telehealth: Payer: Self-pay | Admitting: Gastroenterology

## 2021-03-12 NOTE — Telephone Encounter (Signed)
Inbound call from patient states she would like a call back to discuss reducing mgs for Hyoscyamine Sulfate because  0.375mg  caused patient to be dizzy and loss of appetite. Says her esophagus was more irritated. Asked if it can be sent to [facility]. Best contact number [phone]

## 2021-03-12 NOTE — Telephone Encounter (Signed)
The pt called in May to state that her hyoscyamine 0.125 mg was no longer working.  Levbid 0.375 mg was sent in at that time.  However, she never started it because the spasms resolved and she continued taking the hyoscyamine.   Her prescription got low and she decided to try the levbid but states it caused her to be very dizzy and had a loss of appetite.  She took it one more day to make sure that was what was causing her symptoms and it caused the same symptoms so she has went back to the hyoscyamine and seems to be doing well at this time  She does not need a refill now.  She will call if this does not work or her spasms worsen.  I will take levbid off her medication list.  FYI Dr Ardis Hughs.

## 2021-03-20 ENCOUNTER — Emergency Department (HOSPITAL_COMMUNITY): Payer: Medicare Other

## 2021-03-20 ENCOUNTER — Encounter (HOSPITAL_COMMUNITY): Payer: Self-pay | Admitting: Emergency Medicine

## 2021-03-20 ENCOUNTER — Other Ambulatory Visit: Payer: Self-pay

## 2021-03-20 ENCOUNTER — Emergency Department (HOSPITAL_COMMUNITY)
Admission: EM | Admit: 2021-03-20 | Discharge: 2021-03-20 | Disposition: A | Discharge status: Home / Self Care | Payer: Medicare Other | Attending: Emergency Medicine | Admitting: Emergency Medicine

## 2021-03-20 DIAGNOSIS — J449 Chronic obstructive pulmonary disease, unspecified: Secondary | ICD-10-CM | POA: Diagnosis not present

## 2021-03-20 DIAGNOSIS — R0789 Other chest pain: Secondary | ICD-10-CM | POA: Insufficient documentation

## 2021-03-20 DIAGNOSIS — K838 Other specified diseases of biliary tract: Secondary | ICD-10-CM | POA: Diagnosis not present

## 2021-03-20 DIAGNOSIS — R42 Dizziness and giddiness: Secondary | ICD-10-CM | POA: Diagnosis not present

## 2021-03-20 DIAGNOSIS — R002 Palpitations: Secondary | ICD-10-CM | POA: Insufficient documentation

## 2021-03-20 DIAGNOSIS — Z79899 Other long term (current) drug therapy: Secondary | ICD-10-CM | POA: Diagnosis not present

## 2021-03-20 DIAGNOSIS — I728 Aneurysm of other specified arteries: Secondary | ICD-10-CM | POA: Diagnosis not present

## 2021-03-20 DIAGNOSIS — Z85828 Personal history of other malignant neoplasm of skin: Secondary | ICD-10-CM | POA: Diagnosis not present

## 2021-03-20 DIAGNOSIS — Z87891 Personal history of nicotine dependence: Secondary | ICD-10-CM | POA: Insufficient documentation

## 2021-03-20 DIAGNOSIS — R079 Chest pain, unspecified: Secondary | ICD-10-CM

## 2021-03-20 DIAGNOSIS — I10 Essential (primary) hypertension: Secondary | ICD-10-CM | POA: Diagnosis not present

## 2021-03-20 DIAGNOSIS — K5730 Diverticulosis of large intestine without perforation or abscess without bleeding: Secondary | ICD-10-CM | POA: Diagnosis not present

## 2021-03-20 DIAGNOSIS — I4891 Unspecified atrial fibrillation: Secondary | ICD-10-CM | POA: Diagnosis not present

## 2021-03-20 DIAGNOSIS — K219 Gastro-esophageal reflux disease without esophagitis: Secondary | ICD-10-CM | POA: Diagnosis not present

## 2021-03-20 LAB — BASIC METABOLIC PANEL
Anion gap: 9 (ref 5–15)
BUN: 18 mg/dL (ref 8–23)
CO2: 25 mmol/L (ref 22–32)
Calcium: 9.5 mg/dL (ref 8.9–10.3)
Chloride: 103 mmol/L (ref 98–111)
Creatinine, Ser: 0.68 mg/dL (ref 0.44–1.00)
GFR, Estimated: >60 mL/min (ref >60)
Glucose, Bld: 103 mg/dL — ABNORMAL HIGH (ref 70–99)
Potassium: 4 mmol/L (ref 3.5–5.1)
Sodium: 137 mmol/L (ref 135–145)

## 2021-03-20 LAB — HEPATIC FUNCTION PANEL
ALT: 12 U/L (ref 0–44)
AST: 19 U/L (ref 15–41)
Albumin: 4.2 g/dL (ref 3.5–5.0)
Alkaline Phosphatase: 65 U/L (ref 38–126)
Bilirubin, Direct: 0.1 mg/dL (ref 0.0–0.2)
Indirect Bilirubin: 0.9 mg/dL (ref 0.3–0.9)
Total Bilirubin: 1 mg/dL (ref 0.3–1.2)
Total Protein: 7.1 g/dL (ref 6.5–8.1)

## 2021-03-20 LAB — CBC
HCT: 40.4 % (ref 36.0–46.0)
Hemoglobin: 13.4 g/dL (ref 12.0–15.0)
MCH: 31.4 pg (ref 26.0–34.0)
MCHC: 33.2 g/dL (ref 30.0–36.0)
MCV: 94.6 fL (ref 80.0–100.0)
Platelets: 230 10*3/uL (ref 150–400)
RBC: 4.27 MIL/uL (ref 3.87–5.11)
RDW: 13.2 % (ref 11.5–15.5)
WBC: 3.6 10*3/uL — ABNORMAL LOW (ref 4.0–10.5)
nRBC: 0 % (ref 0.0–0.2)

## 2021-03-20 LAB — TROPONIN I (HIGH SENSITIVITY)
Troponin I (High Sensitivity): 5 ng/L (ref <18)
Troponin I (High Sensitivity): 5 ng/L (ref <18)

## 2021-03-20 LAB — BRAIN NATRIURETIC PEPTIDE: B Natriuretic Peptide: 81.9 pg/mL (ref 0.0–100.0)

## 2021-03-20 MED ORDER — NITROGLYCERIN 0.4 MG SL SUBL
0.4000 mg | SUBLINGUAL_TABLET | Freq: Once | SUBLINGUAL | Status: AC
  Administered 2021-03-20: 0.4 mg via SUBLINGUAL
  Filled 2021-03-20: qty 1

## 2021-03-20 MED ORDER — FENTANYL CITRATE PF 50 MCG/ML IJ SOSY
25.0000 ug | PREFILLED_SYRINGE | Freq: Once | INTRAMUSCULAR | Status: AC
  Administered 2021-03-20: 25 ug via INTRAVENOUS
  Filled 2021-03-20: qty 1

## 2021-03-20 MED ORDER — IOHEXOL 350 MG/ML SOLN
80.0000 mL | Freq: Once | INTRAVENOUS | Status: AC | PRN
  Administered 2021-03-20: 80 mL via INTRAVENOUS

## 2021-03-20 NOTE — Discharge Instructions (Addendum)
Recommend following up with your primary care doctor and your cardiologist.  If you have recurrent episodes of chest pain, difficulty breathing or other new concerning symptom come back to ER for reassessment.  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  Please discuss these findings with your primary doctor and discussed the biliary ductal dilatation with your gastroenterologist.  Your doctors may want to periodically monitor this with further imaging studies.

## 2021-03-20 NOTE — ED Provider Notes (Signed)
[facility] EMERGENCY DEPARTMENT Provider Note   CSN: [id number] Arrival date & time: 03/20/21  8101     History Chief Complaint  Patient presents with   Palpitations    [name] [name] Want is a 87 y.o. female.  With past medical history of paroxysmal SVT, GERD, hypertension who presents to the emergency department with chest pain.  She states that she has had weeks of intermittent palpitations and mild chest pain that have self abated.  She states this morning she began having intermittent, severe, left-sided, nonradiating chest pain and fluttering that progressed to constant chest pain.  Denies nausea or vomiting, diaphoresis, shortness of breath.  Nothing makes it worse or better.  Denies recent illnesses, fever, abdominal pain.   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   Left forearm - CX3 + 5FU   Osteoporosis 07/21/2011   t score -2.7   Palpitations 05/24/2017   Stricture esophagus    TMJ pain dysfunction syndrome 09/25/2018   Vitamin D deficiency    2010    Patient Active Problem List   Diagnosis Date Noted   Night sweats 02/12/2021   Abdominal pain, chronic, epigastric    Dysphagia 02/05/2020   Gastritis and gastroduodenitis    Esophageal spasm 01/17/2020   Venous insufficiency of both lower extremities 06/04/2019   Hyperlipidemia 06/04/2019   History of repair of rectocele 03/13/2019   Dysuria 03/07/2019   Rotator cuff arthropathy, right 04/08/2016   Arthritis of knee 03/25/2016    Presbycusis of both ears 01/15/2016   Skin cancer of arm 09/26/2014   History of TIA (transient ischemic attack) 07/19/2011   ATRIAL PREMATURE BEATS 03/20/2009   ANEMIA-IRON DEFICIENCY 04/02/2008   Anxiety 04/02/2008   Essential hypertension 04/02/2008   Osteoporosis 04/02/2008    Past Surgical History:  Procedure Laterality Date   A&P, enterocele repair     s/p 2003   ABDOMINAL HYSTERECTOMY     BIOPSY  02/05/2020   Procedure: BIOPSY;  Surgeon: Thornton Park, MD;  Location: [facility];  Service: Gastroenterology;;   BREAST REDUCTION SURGERY     s/p 1996      PATIENT DENIES BREAST REDUCTION    ESOPHAGEAL DILATION  02/05/2020   Procedure: ESOPHAGEAL DILATION;  Surgeon: Thornton Park, MD;  Location: [facility];  Service: Gastroenterology;;   ESOPHAGEAL MANOMETRY N/A 05/07/2020   Procedure: ESOPHAGEAL MANOMETRY (EM);  Surgeon: Milus Banister, MD;  Location: [facility];  Service: Endoscopy;  Laterality: N/A;   ESOPHAGOGASTRODUODENOSCOPY (EGD) WITH PROPOFOL N/A 02/05/2020   Procedure: ESOPHAGOGASTRODUODENOSCOPY (EGD) WITH PROPOFOL;  Surgeon: Thornton Park, MD;  Location: [facility];  Service: Gastroenterology;  Laterality: N/A;   HEMORRHOID SURGERY     LEFT HEART CATH AND CORONARY ANGIOGRAPHY N/A 05/25/2017   Procedure: LEFT HEART CATH AND CORONARY ANGIOGRAPHY;  Surgeon: Jettie Booze, MD;  Location: [facility];  Service: Cardiovascular;  Laterality: N/A;  OB History     Gravida  2   Para  2   Term      Preterm      AB      Living  1      SAB      IAB      Ectopic      Multiple      Live Births              Family History  Problem Relation Age of Onset   Stroke Father        @ 65   Hypertension Father    Hypertension Sister    Cancer Sister 78       brain   Breast cancer Other 57       breast   Breast cancer Daughter 35       spread to her brain   Cancer Other 18       colon/MELANOMA   Stroke Mother         medication induced   Kidney disease Brother    Cancer Brother     Social History   Tobacco Use   Smoking status: Former    Types: Cigarettes    Quit date: 05/24/1968    Years since quitting: 52.8   Smokeless tobacco: Never   Tobacco comments:    only smoked 7 years  Vaping Use   Vaping Use: Never used  Substance Use Topics   Alcohol use: No    Alcohol/week: 0.0 standard drinks   Drug use: No    Home Medications Prior to Admission medications   Medication Sig Start Date End Date Taking? Authorizing Provider  AMBULATORY NON FORMULARY MEDICATION Peppermint altoids 1-2 tabs as needed    [provider]  bismuth subsalicylate (PEPTO BISMOL) 262 MG/15ML suspension Take 30 mLs by mouth as needed.    [provider]  busPIRone (BUSPAR) 5 MG tablet Take 1 tablet (5 mg total) by mouth 2 (two) times daily. 01/12/21 04/12/21  [name], MD  flecainide (TAMBOCOR) 50 MG tablet TAKE 2 TABLETS EVERY MORNING AND 1 TABLET AT NIGHT 07/08/20   Evans Lance, MD  metoprolol succinate (TOPROL XL) 25 MG 24 hr tablet May take 1/2 tablet by mouth daily for breakthrough palpitations. 04/22/20   Evans Lance, MD  nitroGLYCERIN (NITROSTAT) 0.3 MG SL tablet Place 1 tablet (0.3 mg total) under the tongue every 5 (five) minutes as needed (esophageal pain. Do not take any more than 3 doses in 24 hours.). 03/11/20 03/11/21  [name], MD  nystatin cream (MYCOSTATIN) Apply 1 application topically 2 (two) times daily. 02/19/21   [name], MD    Allergies    Other, Pantoprazole, Zyrtec [cetirizine], Ciprofloxacin, and Nifedipine  Review of Systems   Review of Systems  Constitutional:  Negative for diaphoresis and fever.  Respiratory:  Negative for cough and shortness of breath.   Cardiovascular:  Positive for chest pain and palpitations. Negative for leg swelling.  Gastrointestinal:  Negative for abdominal pain, nausea and vomiting.  All other systems reviewed and are  negative.  Physical Exam Updated Vital Signs BP (!) 144/77   Pulse (!) 50   Temp 97.8 F (36.6 C) (Oral)   Resp (!) 25   LMP 05/24/1970   SpO2 100%   Physical Exam Vitals and nursing note reviewed.  Constitutional:      Appearance: Normal appearance.     Comments: Patient with active chest pain, clenching her chest  HENT:     Head: Normocephalic and atraumatic.     Mouth/Throat:     Mouth: Mucous membranes are moist.     Pharynx: Oropharynx is clear.  Eyes:     General: No scleral icterus.    Extraocular Movements: Extraocular movements intact.     Pupils: Pupils are equal, round, and reactive to light.  Cardiovascular:     Rate and Rhythm: Normal rate. Rhythm irregular.     Pulses: Normal pulses.     Heart sounds: No murmur heard. Pulmonary:     Effort: Pulmonary effort is normal. No respiratory distress.     Breath sounds: Normal breath sounds.  Abdominal:     General: Bowel sounds are normal. There is no distension.     Palpations: Abdomen is soft.     Tenderness: There is no abdominal tenderness.  Musculoskeletal:        General: Normal range of motion.     Cervical back: Normal range of motion and neck supple. No tenderness.  Skin:    General: Skin is warm and dry.     Capillary Refill: Capillary refill takes less than 2 seconds.  Neurological:     General: No focal deficit present.     Mental Status: She is alert and oriented to person, place, and time. Mental status is at baseline.  Psychiatric:        Mood and Affect: Mood normal.        Behavior: Behavior normal.    ED Results / Procedures / Treatments   Labs (all labs ordered are listed, but only abnormal results are displayed) Labs Reviewed  BASIC METABOLIC PANEL - Abnormal; Notable for the following components:      Result Value   Glucose, Bld 103 (*)    All other components within normal limits  CBC - Abnormal; Notable for the following components:   WBC 3.6 (*)    All other components within  normal limits  HEPATIC FUNCTION PANEL  BRAIN NATRIURETIC PEPTIDE  TROPONIN I (HIGH SENSITIVITY)  TROPONIN I (HIGH SENSITIVITY)    EKG None  Radiology DG Chest 2 View  Result Date: 03/20/2021 CLINICAL DATA:  Palpitations. EXAM: CHEST - 2 VIEW COMPARISON:  Radiographs 08/28/2020.  CT 03/14/2016. FINDINGS: The heart size and mediastinal contours are stable. There is aortic atherosclerosis. Stable findings of chronic obstructive pulmonary disease and mild bibasilar scarring. No superimposed edema, pleural effusion or confluent airspace opacity. The bones appear unremarkable. IMPRESSION: Stable chest without evidence of acute cardiopulmonary process. Stable changes of COPD and mild basilar scarring. Electronically Signed   By: Richardean Sale M.D.   On: 03/20/2021 09:11    Procedures Procedures   Medications Ordered in ED Medications  fentaNYL (SUBLIMAZE) injection 25 mcg (has no administration in time range)  nitroGLYCERIN (NITROSTAT) SL tablet 0.4 mg (0.4 mg Sublingual Given 03/20/21 1758)    ED Course  I have reviewed the triage vital signs and the nursing notes.  Pertinent labs & imaging results that were available during my care of the patient were reviewed by me and considered in my medical decision making (see chart for details).  Initial EKG on review shows NSR with 1st degree AVB, however while evaluating the patient, monitor is showing irregular rhythm - will repeat.   - repeat EKG with NSR, 1st degree AVB, occasional PAC Having active chest pain. BP able to tolerate nitroglycerin so will trial.  1815: Nitroglycerin without relief of symptoms. Will give Fentanyl and re-evaluate. Given severity  of pain, reasonable for dissection study.  MDM Rules/Calculators/A&P 87 year old female who presents to the emergency department with chest pain.  EKG with normal sinus rhythm, rate 78, first-degree AV block with premature atrial beats.  Troponin pending, BNP pending Trial of  nitroglycerin without relief of symptoms Given severity of chest pain obtaining CT dissection study -pending Chest x-ray without pneumothorax, pneumonia or other intrathoracic abnormalities.  CBC, CMP without abnormalities.  At time of handoff patient still being worked up for presentation of chest pain.  She is being signed out to Dr. Roslynn Amble who will continue to manage the patient at this time.  Disposition and final diagnosis pending work-up.  I discussed this case with my attending physician who cosigned this note including patient's presenting symptoms, physical exam, and planned diagnostics and interventions. Attending physician stated agreement with plan or made changes to plan which were implemented.   Attending physician assessed patient at bedside.  Final Clinical Impression(s) / ED Diagnoses Final diagnoses:  None    Rx / DC Orders ED Discharge Orders     None        Bing Matter 03/20/21 1908    Lucrezia Starch, MD 03/20/21 2204    Lucrezia Starch, MD 04/25/21 [phone]

## 2021-03-20 NOTE — ED Notes (Signed)
PT called for vitals, NA x1.

## 2021-03-20 NOTE — ED Notes (Signed)
To ct

## 2021-03-20 NOTE — ED Notes (Signed)
Chest pain since early this am hx of the same  she lives alone

## 2021-03-20 NOTE — ED Triage Notes (Signed)
Patient BIB [facility] from home where she lives alone for evaluation of heart palpitations present when she woke this morning. Patient denies pain, patient self-administered 324mg  aspirin PTA. Heart rate irregular from 68-86.

## 2021-03-28 DIAGNOSIS — Z23 Encounter for immunization: Secondary | ICD-10-CM | POA: Diagnosis not present

## 2021-03-30 ENCOUNTER — Other Ambulatory Visit: Payer: Self-pay | Admitting: Internal Medicine

## 2021-03-30 DIAGNOSIS — Z20828 Contact with and (suspected) exposure to other viral communicable diseases: Secondary | ICD-10-CM | POA: Diagnosis not present

## 2021-04-02 ENCOUNTER — Ambulatory Visit (INDEPENDENT_AMBULATORY_CARE_PROVIDER_SITE_OTHER): Payer: Medicare Other | Admitting: Student

## 2021-04-02 ENCOUNTER — Encounter: Payer: Self-pay | Admitting: Internal Medicine

## 2021-04-02 DIAGNOSIS — H04123 Dry eye syndrome of bilateral lacrimal glands: Secondary | ICD-10-CM | POA: Diagnosis not present

## 2021-04-02 DIAGNOSIS — I728 Aneurysm of other specified arteries: Secondary | ICD-10-CM | POA: Diagnosis not present

## 2021-04-02 DIAGNOSIS — H0102B Squamous blepharitis left eye, upper and lower eyelids: Secondary | ICD-10-CM | POA: Diagnosis not present

## 2021-04-02 DIAGNOSIS — K224 Dyskinesia of esophagus: Secondary | ICD-10-CM | POA: Diagnosis not present

## 2021-04-02 DIAGNOSIS — K838 Other specified diseases of biliary tract: Secondary | ICD-10-CM

## 2021-04-02 DIAGNOSIS — H2513 Age-related nuclear cataract, bilateral: Secondary | ICD-10-CM | POA: Diagnosis not present

## 2021-04-02 DIAGNOSIS — H5703 Miosis: Secondary | ICD-10-CM | POA: Diagnosis not present

## 2021-04-02 DIAGNOSIS — H0102A Squamous blepharitis right eye, upper and lower eyelids: Secondary | ICD-10-CM | POA: Diagnosis not present

## 2021-04-02 HISTORY — DX: Aneurysm of other specified arteries: I72.8

## 2021-04-02 HISTORY — DX: Other specified diseases of biliary tract: K83.8

## 2021-04-02 NOTE — Assessment & Plan Note (Signed)
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 Patient is presenting today to discuss this finding. Explained that there is nothing currently to do for this and it was an incidental finding. She is currently not experiencing abdominal or back pain and most recent labs were appropriate.   - Continue to monitor

## 2021-04-02 NOTE — Patient Instructions (Signed)
[name], it was a pleasure seeing you today!  Today we discussed: - Splenic aneurysm and bile duct dilation: There is nothing to do for these findings unless you start to develop symptoms. We will continue to monitor for now.  - [facility]: This has been faxed to [facility], so you should be hearing from them soon.   Follow-up: 3 months   Please make sure to arrive 15 minutes prior to your next appointment. If you arrive late, you may be asked to reschedule.   We look forward to seeing you next time. Please call our clinic at [phone] if you have any questions or concerns. The best time to call is Monday-Friday from 9am-4pm, but there is someone available 24/7. If after hours or the weekend, call the main hospital number and ask for the Internal Medicine Resident On-Call. If you need medication refills, please notify your pharmacy one week in advance and they will send [place] a request.  Thank you for letting us take part in your care. Wishing you the best!  Thank you, Sanjuan Dame, MD

## 2021-04-02 NOTE — Progress Notes (Signed)
   CC: ED follow-up  HPI:  Ms.Jenin C [name] is a 87 y.o. with hypertension, recurrent esophageal spasms, and hx TIA presenting to [facility] after recent visit to Emergency Department for chest pain.  Please see problem-based list for further details.  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 No murmurs appreciated Pulm: Normal work of breathing on room air. Clear to ausculation bilaterally Neuro: Awake, alert, conversing appropriately. No focal deficits appreciated. Psych: Normal mood, affect, speech.  Assessment & Plan:   See Encounters Tab for problem based charting.  Patient discussed with Dr. Dareen Piano

## 2021-04-02 NOTE — Assessment & Plan Note (Signed)
Patient was most recently seen in Emergency Department two weeks ago for chest pain. Work-up at that time negative for cardiac etiologies. At that time, chest pain thought most likely to be 2/2 esophageal spasms. Since this time, patient reports intermittent pain consistent with previous esophageal spasms. Reports that the last time she saw GI, she was told the next step for her would be feeding tube. Currently, she is only able to tolerate liquid/soft foods. Ms. Sako would like to pursue a second opinion at [facility]. In addition, patient has been taking hyoscyamine which has mildly helped. She does mention previous high dose caused her to be dizzy. Discussed with patient that she could trial taking bigger dose before bed and resuming the lower dose in the AM to reduce side effects.  - Follow-up with GI - Referral for GI at [facility] placed - Continue hyoscyamine as tolerated

## 2021-04-02 NOTE — Assessment & Plan Note (Signed)
Patient was found to have extrahepatic bile duct dilation during recent ED visit. Patient is presenting today to discuss this. She denies having abdominal pain or other symptoms of gallbladder disease. Explained that this could represent a gallstone vs normal variant. Given the fact that she is asymptomatic, we will continue with supportive care and monitor as needed. Most recent lab work appropriate.   - Continue to monitor

## 2021-04-03 NOTE — Progress Notes (Signed)
Internal Medicine Clinic Attending ? ?Case discussed with Dr. Braswell  At the time of the visit.  We reviewed the resident?s history and exam and pertinent patient test results.  I agree with the assessment, diagnosis, and plan of care documented in the resident?s note.  ?

## 2021-04-04 DIAGNOSIS — Z23 Encounter for immunization: Secondary | ICD-10-CM | POA: Diagnosis not present

## 2021-04-13 ENCOUNTER — Encounter (HOSPITAL_COMMUNITY): Payer: Self-pay

## 2021-04-13 ENCOUNTER — Emergency Department (HOSPITAL_COMMUNITY): Payer: Medicare Other

## 2021-04-13 ENCOUNTER — Emergency Department (HOSPITAL_COMMUNITY)
Admission: EM | Admit: 2021-04-13 | Discharge: 2021-04-13 | Disposition: A | Discharge status: Home / Self Care | Payer: Medicare Other | Attending: Emergency Medicine | Admitting: Emergency Medicine

## 2021-04-13 ENCOUNTER — Telehealth: Payer: Self-pay

## 2021-04-13 ENCOUNTER — Other Ambulatory Visit: Payer: Self-pay

## 2021-04-13 DIAGNOSIS — R1310 Dysphagia, unspecified: Secondary | ICD-10-CM | POA: Diagnosis not present

## 2021-04-13 DIAGNOSIS — I10 Essential (primary) hypertension: Secondary | ICD-10-CM | POA: Diagnosis not present

## 2021-04-13 DIAGNOSIS — Z87891 Personal history of nicotine dependence: Secondary | ICD-10-CM | POA: Diagnosis not present

## 2021-04-13 DIAGNOSIS — Z79899 Other long term (current) drug therapy: Secondary | ICD-10-CM | POA: Insufficient documentation

## 2021-04-13 DIAGNOSIS — F419 Anxiety disorder, unspecified: Secondary | ICD-10-CM | POA: Diagnosis not present

## 2021-04-13 DIAGNOSIS — R059 Cough, unspecified: Secondary | ICD-10-CM | POA: Insufficient documentation

## 2021-04-13 LAB — CBC WITH DIFFERENTIAL/PLATELET
Abs Immature Granulocytes: 0.02 10*3/uL (ref 0.00–0.07)
Basophils Absolute: 0 10*3/uL (ref 0.0–0.1)
Basophils Relative: 1 %
Eosinophils Absolute: 0 10*3/uL (ref 0.0–0.5)
Eosinophils Relative: 0 %
HCT: 40.7 % (ref 36.0–46.0)
Hemoglobin: 13.4 g/dL (ref 12.0–15.0)
Immature Granulocytes: 0 %
Lymphocytes Relative: 21 %
Lymphs Abs: 1 10*3/uL (ref 0.7–4.0)
MCH: 31.4 pg (ref 26.0–34.0)
MCHC: 32.9 g/dL (ref 30.0–36.0)
MCV: 95.3 fL (ref 80.0–100.0)
Monocytes Absolute: 0.4 10*3/uL (ref 0.1–1.0)
Monocytes Relative: 9 %
Neutro Abs: 3.2 10*3/uL (ref 1.7–7.7)
Neutrophils Relative %: 69 %
Platelets: 252 10*3/uL (ref 150–400)
RBC: 4.27 MIL/uL (ref 3.87–5.11)
RDW: 13.5 % (ref 11.5–15.5)
WBC: 4.7 10*3/uL (ref 4.0–10.5)
nRBC: 0 % (ref 0.0–0.2)

## 2021-04-13 LAB — BASIC METABOLIC PANEL
Anion gap: 10 (ref 5–15)
BUN: 16 mg/dL (ref 8–23)
CO2: 26 mmol/L (ref 22–32)
Calcium: 9.7 mg/dL (ref 8.9–10.3)
Chloride: 99 mmol/L (ref 98–111)
Creatinine, Ser: 0.69 mg/dL (ref 0.44–1.00)
GFR, Estimated: >60 mL/min (ref >60)
Glucose, Bld: 100 mg/dL — ABNORMAL HIGH (ref 70–99)
Potassium: 4.5 mmol/L (ref 3.5–5.1)
Sodium: 135 mmol/L (ref 135–145)

## 2021-04-13 MED ORDER — LORAZEPAM 0.5 MG PO TABS
1.0000 mg | ORAL_TABLET | Freq: Two times a day (BID) | ORAL | 0 refills | Status: DC | PRN
Start: 2021-04-13 — End: 2022-06-02

## 2021-04-13 MED ORDER — LORAZEPAM 1 MG PO TABS
0.5000 mg | ORAL_TABLET | Freq: Once | ORAL | Status: AC
  Administered 2021-04-13: 0.5 mg via ORAL
  Filled 2021-04-13: qty 1

## 2021-04-13 NOTE — ED Provider Notes (Signed)
Emergency Medicine Provider Triage Evaluation Note  [name] [name] , a 87 y.o. female  was evaluated in triage.  Pt complains of cough x 1-2 days.  Patient reports associated shortness of breath.  Patient called her doctor this morning for concerns of her symptoms and told to return to the ED for evaluation.  Patient reports her shortness of breath has since resolved while being in the ED due to drinking water.  Denies chest pain, abdominal pain, nausea, vomiting.   Review of Systems  Positive: cough Negative: Chest pain  Physical Exam  BP (!) 141/102 (BP Location: Left Arm)   Pulse 75   Temp 97.9 F (36.6 C) (Oral)   Resp 20   LMP 05/24/1970   SpO2 100%  Gen:   Awake, no distress   Resp:  Normal effort  MSK:   Moves extremities without difficulty  Other:  Patent oropharynx.  Medical Decision Making  Medically screening exam initiated at 12:01 PM.  Appropriate orders placed.  [name] was informed that the remainder of the evaluation will be completed by another provider, this initial triage assessment does not replace that evaluation, and the importance of remaining in the ED until their evaluation is complete.    Tamar Miano A, PA-C 04/13/21 1203    Daleen Bo, MD 04/13/21 1736

## 2021-04-13 NOTE — Discharge Instructions (Signed)
Your symptoms are likely related to the combination of stopping the lorazepam, and starting the BuSpar.  It is not appear to be helpful to continue taking the BuSpar.  Stop taking that, for now.  We are giving you a short-term prescription for lorazepam to take until you can follow-up with your doctor for further care and treatment.  Make sure you are drinking plenty of water, getting plenty of rest and taking all of your other medicines as prescribed.

## 2021-04-13 NOTE — ED Provider Notes (Signed)
[place] EMERGENCY DEPARTMENT Provider Note   CSN: [id number] Arrival date & time: 04/13/21  1124     History Chief Complaint  Patient presents with   trouble swallowing    [name] [name] is a 87 y.o. female.  HPI He presents for evaluation of sensation of difficulty swallowing, and persistent coughing.  Onset of symptoms several days ago.  They are persistent.  They felt worse today so she decided come here after chatting with her PCP office.  3 weeks ago she was taken off lorazepam which she had been taking, for about 20 years, intermittently for anxiety symptoms.  At that time she was started on BuSpar, to treat for anxiety symptoms.  She feels like the BuSpar is causing her to feel weak and tired.  She has ongoing esophageal problems, previously treated with esophageal dilatation.  She takes helps clean, periodically for esophageal spasm symptoms.  She has not had any frank choking on food.  She denies vomiting, dizziness, fever, chills, shortness of breath or diaphoresis.  There are no other known active modifying factors.    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   Left forearm - CX3 + 5FU   Osteoporosis 07/21/2011   t score -2.7   Palpitations 05/24/2017   Stricture esophagus    TMJ pain dysfunction syndrome 09/25/2018   Vitamin D deficiency    2010    Patient Active Problem List   Diagnosis Date Noted   Splenic artery aneurysm ([place]) 04/02/2021   Common bile duct dilation 04/02/2021   Night sweats 02/12/2021   Abdominal pain, chronic, epigastric    Dysphagia 02/05/2020   Gastritis and gastroduodenitis    Esophageal spasm  01/17/2020   Venous insufficiency of both lower extremities 06/04/2019   Hyperlipidemia 06/04/2019   History of repair of rectocele 03/13/2019   Dysuria 03/07/2019   Rotator cuff arthropathy, right 04/08/2016   Arthritis of knee 03/25/2016   Presbycusis of both ears 01/15/2016   Skin cancer of arm 09/26/2014   History of TIA (transient ischemic attack) 07/19/2011   ATRIAL PREMATURE BEATS 03/20/2009   ANEMIA-IRON DEFICIENCY 04/02/2008   Anxiety 04/02/2008   Essential hypertension 04/02/2008   Osteoporosis 04/02/2008    Past Surgical History:  Procedure Laterality Date   A&P, enterocele repair     s/p 2003   ABDOMINAL HYSTERECTOMY     BIOPSY  02/05/2020   Procedure: BIOPSY;  Surgeon: Thornton Park, MD;  Location: [facility] ENDOSCOPY;  Service: Gastroenterology;;   BREAST REDUCTION SURGERY     s/p 1996      PATIENT DENIES BREAST REDUCTION    ESOPHAGEAL DILATION  02/05/2020   Procedure: ESOPHAGEAL DILATION;  Surgeon: Thornton Park, MD;  Location: [place];  Service: Gastroenterology;;   ESOPHAGEAL MANOMETRY N/A 05/07/2020   Procedure: ESOPHAGEAL MANOMETRY (EM);  Surgeon: Milus Banister, MD;  Location: [facility] ENDOSCOPY;  Service: Endoscopy;  Laterality: N/A;   ESOPHAGOGASTRODUODENOSCOPY (EGD) WITH PROPOFOL N/A 02/05/2020   Procedure: ESOPHAGOGASTRODUODENOSCOPY (EGD) WITH PROPOFOL;  Surgeon: Thornton Park, MD;  Location: [place];  Service: Gastroenterology;  Laterality: N/A;   HEMORRHOID SURGERY     LEFT HEART CATH AND CORONARY ANGIOGRAPHY N/A 05/25/2017   Procedure: LEFT HEART CATH AND CORONARY ANGIOGRAPHY;  Surgeon: Jettie Booze, MD;  Location: [place] CV LAB;  Service: Cardiovascular;  Laterality: N/A;     OB History     Gravida  2   Para  2   Term      Preterm      AB      Living  1      SAB      IAB      Ectopic      Multiple      Live Births              Family History  Problem Relation Age of Onset   Stroke Father        @ 52    Hypertension Father    Hypertension Sister    Cancer Sister 41       brain   Breast cancer Other 64       breast   Breast cancer Daughter 50       spread to her brain   Cancer Other 86       colon/MELANOMA   Stroke Mother        medication induced   Kidney disease Brother    Cancer Brother     Social History   Tobacco Use   Smoking status: Former    Types: Cigarettes    Quit date: 05/24/1968    Years since quitting: 52.9   Smokeless tobacco: Never   Tobacco comments:    only smoked 7 years  Vaping Use   Vaping Use: Never used  Substance Use Topics   Alcohol use: No    Alcohol/week: 0.0 standard drinks   Drug use: No    Home Medications Prior to Admission medications   Medication Sig Start Date End Date Taking? Authorizing Provider  LORazepam (ATIVAN) 0.5 MG tablet Take 2 tablets (1 mg total) by mouth 2 (two) times daily as needed for anxiety. 04/13/21  Yes Daleen Bo, MD  AMBULATORY NON FORMULARY MEDICATION Peppermint altoids 1-2 tabs as needed    [provider]  bismuth subsalicylate (PEPTO BISMOL) 262 MG/15ML suspension Take 30 mLs by mouth as needed.    [provider]  flecainide (TAMBOCOR) 50 MG tablet TAKE 2 TABLETS EVERY MORNING AND 1 TABLET AT NIGHT 03/31/21   Evans Lance, MD  metoprolol succinate (TOPROL XL) 25 MG 24 hr tablet May take 1/2 tablet by mouth daily for breakthrough palpitations. 04/22/20   Evans Lance, MD  nitroGLYCERIN (NITROSTAT) 0.3 MG SL tablet Place 1 tablet (0.3 mg total) under the tongue every 5 (five) minutes as needed (esophageal pain. Do not take any more than 3 doses in 24 hours.). 03/11/20 03/11/21  [name], MD  nystatin cream (MYCOSTATIN) Apply 1 application topically 2 (two) times daily. 02/19/21   [name], MD    Allergies    Other, Pantoprazole, Zyrtec [cetirizine], Ciprofloxacin, and Nifedipine  Review of Systems   Review of Systems  All other systems reviewed and are  negative.  Physical Exam Updated Vital Signs BP (!) 184/88 (BP Location: Left Arm)   Pulse 77   Temp 98 F (36.7 C) (Oral)   Resp 16   LMP 05/24/1970   SpO2 98%   Physical Exam Vitals and nursing note reviewed.  Constitutional:      General: She is  not in acute distress.    Appearance: She is well-developed. She is not ill-appearing, toxic-appearing or diaphoretic.  HENT:     Head: Normocephalic and atraumatic.     Right Ear: External ear normal.     Left Ear: External ear normal.     Mouth/Throat:     Mouth: Mucous membranes are moist.     Pharynx: No oropharyngeal exudate.  Eyes:     Conjunctiva/sclera: Conjunctivae normal.     Pupils: Pupils are equal, round, and reactive to light.  Neck:     Trachea: Phonation normal.  Cardiovascular:     Rate and Rhythm: Normal rate and regular rhythm.     Heart sounds: Normal heart sounds.  Pulmonary:     Effort: Pulmonary effort is normal.     Breath sounds: Normal breath sounds.     Comments: She is persistently clearing her throat and having a irritant type of nonproductive cough.  Both of these symptoms seem to improve when she drinks water. Abdominal:     General: There is no distension.     Palpations: Abdomen is soft.  Musculoskeletal:        General: Normal range of motion.     Cervical back: Normal range of motion and neck supple.  Skin:    General: Skin is warm and dry.  Neurological:     Mental Status: She is alert and oriented to person, place, and time.     Cranial Nerves: No cranial nerve deficit.     Sensory: No sensory deficit.     Motor: No abnormal muscle tone.     Coordination: Coordination normal.  Psychiatric:        Mood and Affect: Mood normal.        Behavior: Behavior normal.        Thought Content: Thought content normal.        Judgment: Judgment normal.    ED Results / Procedures / Treatments   Labs (all labs ordered are listed, but only abnormal results are displayed) Labs Reviewed  BASIC  METABOLIC PANEL - Abnormal; Notable for the following components:      Result Value   Glucose, Bld 100 (*)    All other components within normal limits  RESP PANEL BY RT-PCR (FLU A&B, COVID) ARPGX2  CBC WITH DIFFERENTIAL/PLATELET    EKG None  Radiology DG Chest 2 View  Result Date: 04/13/2021 CLINICAL DATA:  Cough EXAM: CHEST - 2 VIEW COMPARISON:  03/20/2021 FINDINGS: The heart size and mediastinal contours are within normal limits. Pulmonary hyperinflation. The visualized skeletal structures are unremarkable. IMPRESSION: Pulmonary hyperinflation without acute abnormality of the lungs. Electronically Signed   By: Delanna Ahmadi M.D.   On: 04/13/2021 13:04    Procedures Procedures   Medications Ordered in ED Medications  LORazepam (ATIVAN) tablet 0.5 mg (0.5 mg Oral Given 04/13/21 1555)    ED Course  I have reviewed the triage vital signs and the nursing notes.  Pertinent labs & imaging results that were available during my care of the patient were reviewed by me and considered in my medical decision making (see chart for details).    MDM Rules/Calculators/A&P                            Patient Vitals for the past 24 hrs:  BP Temp Temp src Pulse Resp SpO2  04/13/21 1558 (!) 184/88 98 F (36.7 C) Oral 77 16 98 %  04/13/21 1356 140/73 -- -- 71 18 96 %  04/13/21 1131 (!) 141/102 97.9 F (36.6 C) Oral 75 20 100 %    4:23 PM Reevaluation with update and discussion. After initial assessment and treatment, an updated evaluation reveals she is more comfortable now has no further complaints.  Findings discussed and questions answered. Daleen Bo   Medical Decision Making:  This patient is presenting for evaluation of cough and difficulty swallowing, which does require a range of treatment options, and is a complaint that involves a moderate risk of morbidity and mortality. The differential diagnoses include Xanax withdrawal, acute illness, progressive esophageal problems. I  decided to review old records, and in summary elderly female, was taken off chronic benzodiazepine medications 3 weeks ago and prescribed BuSpar to treat for anxiety.  She is presenting with worsening symptoms which are nonspecific.  I did not require additional historical information from anyone.  Clinical Laboratory Tests Ordered, included CBC and Metabolic panel. Review indicates normal . Radiologic Tests Ordered, included chest x-ray.  I independently Visualized: Radiograph images, which show no infiltrate or edema   Critical Interventions-clinical evaluation, laboratory testing, radiography, observation and reassessment  After These Interventions, the Patient was reevaluated and was found stable for discharge.  Patient recently started on BuSpar in lieu of  lorazepam, and not tolerating it.  I suspect she is having benzodiazepine withdrawal, and lethargy due to BuSpar.  Doubt allergic reaction.  Doubt esophageal obstruction.  Doubt serious bacterial infection, pneumonia or PE.  CRITICAL CARE-no Performed by: Daleen Bo  Nursing Notes Reviewed/ Care Coordinated Applicable Imaging Reviewed Interpretation of Laboratory Data incorporated into ED treatment  The patient appears reasonably screened and/or stabilized for discharge and I doubt any other medical condition or other [facility] requiring further screening, evaluation, or treatment in the ED at this time prior to discharge.  Plan: Home Medications-stop BuSpar, resume Xanax, continue other medications; Home Treatments-rest, fluids; return here if the recommended treatment, does not improve the symptoms; Recommended follow up-PCP follow-up for further care treatments as possible.     Final Clinical Impression(s) / ED Diagnoses Final diagnoses:  Anxiety    Rx / DC Orders ED Discharge Orders          Ordered    LORazepam (ATIVAN) 0.5 MG tablet  2 times daily PRN        04/13/21 1621             Daleen Bo, MD 04/13/21  1624

## 2021-04-13 NOTE — ED Triage Notes (Signed)
Patient states that she is having SOB and cough x 1-2 days. Concerned that it may be related to taking Buspar for the past 2 weeks. Patient alert and oriented, appears anxious. Patient reports that she lives alone and tried salt water gargle this am

## 2021-04-13 NOTE — Telephone Encounter (Signed)
Received a TC from patient who c/o feeling like her throat is swelling and it is getting difficult to swallow.  Patient wanting an appt in clinic today. RN informed patient she needs to call 911 or have someone take her to the ED now for emergent evaluation.  Pt arguing that she wants to come to clinic.  She was informed she needs to be evaluated emergently at a higher level of care than an office-clinic appt.  She verbalized understanding and states she will go to the ED now. SChaplin, RN,BSN

## 2021-04-13 NOTE — ED Notes (Signed)
DC instructions reviewed with pt. PT verbalized understanding. PT DC °

## 2021-04-19 ENCOUNTER — Other Ambulatory Visit: Payer: Self-pay | Admitting: Internal Medicine

## 2021-04-19 MED ORDER — GUAIFENESIN 200 MG/5ML PO LIQD: 5.0000 mL | ORAL | 1 refills | Status: DC | PRN

## 2021-04-19 NOTE — Progress Notes (Signed)
Received a page from operator to call [name] [name]. She says that she has been experiencing post nasal drip and phlegm that she has been having difficulty getting up since Monday. Since the clinic was closed today she was hoping that something could be called in for her to take to help her cough up the mucous. She had no shortness of breath over the phone and was speaking in complete sentences. Patient told that a medication would be called in but if her symptoms did not improve with the medication by tomorrow to call clinic to be seen in person or to go to the ED.

## 2021-04-20 ENCOUNTER — Ambulatory Visit (INDEPENDENT_AMBULATORY_CARE_PROVIDER_SITE_OTHER): Payer: Medicare Other | Admitting: Internal Medicine

## 2021-04-20 ENCOUNTER — Telehealth: Payer: Self-pay

## 2021-04-20 DIAGNOSIS — K224 Dyskinesia of esophagus: Secondary | ICD-10-CM

## 2021-04-20 NOTE — Patient Instructions (Signed)
[name], it was a pleasure seeing you today!  Today we discussed: Difficulty with secretions and coughing up phelgm Please call Dr. Ardis Hughs office with [place] at [phone].  Your other appointment with GI is not until 06/2021.  Keep taking Hyoscyamine as you have been.    I have ordered the following labs today:  Lab Orders  No laboratory test(s) ordered today     I will call if any are abnormal. All of your labs can be accessed through "My Chart"   My Chart Access: [email or web address]   Referrals ordered today:   Referral Orders  No referral(s) requested today     I have ordered the following medication/changed the following medications:   Stop the following medications: There are no discontinued medications.   Start the following medications: No orders of the defined types were placed in this encounter.    Follow-up:  1 month    Please make sure to arrive 15 minutes prior to your next appointment. If you arrive late, you may be asked to reschedule.   We look forward to seeing you next time. Please call our clinic at [phone] if you have any questions or concerns. The best time to call is Monday-Friday from 9am-4pm, but there is someone available 24/7. If after hours or the weekend, call the main hospital number and ask for the Internal Medicine Resident On-Call. If you need medication refills, please notify your pharmacy one week in advance and they will send [place] a request.  Thank you for letting us take part in your care. Wishing you the best!  Thank you, Dr. [facility]

## 2021-04-20 NOTE — Progress Notes (Signed)
Subjective:  CC: difficulty managing secretions and productive cough  HPI:  [name] [name] is a 87 y.o. female with a past medical history stated below and presents today for difficulty managing secretions and productive cough. Please see problem based assessment and plan for additional details.  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     AMBULATORY NON FORMULARY MEDICATION Peppermint altoids 1-2 tabs as needed     bismuth subsalicylate (PEPTO BISMOL) 262 MG/15ML suspension Take 30 mLs by mouth as needed.     flecainide (TAMBOCOR) 50 MG tablet TAKE 2 TABLETS EVERY MORNING AND 1 TABLET AT NIGHT 270 tablet 1   Guaifenesin 200 MG/5ML LIQD Take 5 mLs (200 mg total) by mouth every 4 (four) hours as needed. 420 mL 1   LORazepam (ATIVAN) 0.5 MG tablet Take 2 tablets (1 mg total) by mouth 2 (two) times daily as needed for anxiety. 15 tablet 0   metoprolol succinate (TOPROL XL) 25 MG 24 hr tablet May take 1/2 tablet by mouth daily for breakthrough palpitations. 45 tablet 3   nitroGLYCERIN (NITROSTAT) 0.3 MG SL tablet Place 1 tablet (0.3 mg total) under the tongue every 5 (five) minutes as needed (esophageal pain. Do not take any more than 3 doses in 24 hours.). 100 tablet 3   nystatin cream  (MYCOSTATIN) Apply 1 application topically 2 (two) times daily. 30 g 0   No current facility-administered medications on file prior to visit.    Family History  Problem Relation Age of Onset   Stroke Father        @ 28   Hypertension Father    Hypertension Sister    Cancer Sister 34       brain   Breast cancer Other 22       breast   Breast cancer Daughter 2       spread to her brain   Cancer Other 49       colon/MELANOMA   Stroke Mother        medication induced   Kidney disease Brother    Cancer Brother     Social History   Socioeconomic History   Marital status: Widowed    Spouse name: Not on file   Number of children: 2   Years of education: Not on file   Highest education level: Not on file  Occupational History   Occupation: Retired  Tobacco Use   Smoking status: Former    Types: Cigarettes    Quit date: 05/24/1968    Years since quitting: 52.9   Smokeless tobacco: Never   Tobacco comments:  only smoked 7 years  Vaping Use   Vaping Use: Never used  Substance and Sexual Activity   Alcohol use: No    Alcohol/week: 0.0 standard drinks   Drug use: No   Sexual activity: Never    Comment: 1st intercourse 71 yo-1 partner  Other Topics Concern   Not on file  Social History Narrative   Not on file   Social Determinants of Health   Financial Resource Strain: Not on file  Food Insecurity: Not on file  Transportation Needs: Not on file  Physical Activity: Not on file  Stress: Not on file  Social Connections: Not on file  Intimate Partner Violence: Not on file    Review of Systems: ROS negative except for what is noted on the assessment and plan.  Objective:   Vitals:   04/20/21 1409  BP: (!) 168/78  Pulse: 86  Temp: 98 F (36.7 C)  TempSrc: Oral  SpO2: 95%  Weight: 131 lb 11.2 oz (59.7 kg)    Physical Exam: Gen: A&O x3, elderly female in no acute distress HEENT:    Head - normocephalic, atraumatic.    Eye - visual acuity grossly  intact, conjunctiva clear, sclera non-icteric, EOM intact.    Mouth - No obvious caries or periodontal disease. Neck: no masses or nodules, AROM intact. CV: RRR, no murmurs, S1/S2 presents  Resp: Clear to auscultation bilaterally, normal pulmonary effort  Abd: BS (+) x4, soft, non-tender abdomen, without hepatosplenomegaly or masses MSK: Grossly normal AROM and strength x4 extremities. Skin: good skin turgor, no rashes, unusual bruising, or prominent lesions.  Neuro: No focal deficits, grossly normal sensation and coordination.  Psych: Oriented x3 and responding appropriately. Intact memory, normal mood, judgement, affect, and insight.    Assessment & Plan:  See Encounters Tab for problem based charting.  Patient seen with Dr. Jay Schlichter Sedale Jenifer, D.O. [facility]  PGY-1 Pager: [phone]  Phone: [phone] Date 04/21/2021  Time 6:17 PM

## 2021-04-20 NOTE — Telephone Encounter (Signed)
Received TC from patient who states she called o/c physician yesterday for cough w/ "whitish phlegm" and a RX for Guaifenesin was called in for her.  She was instructed to call clinic today if she had no relief.  She states it isn't helping and is requesting an appt.  She denies any worsening SHOB, she is able to speak in complete sentences, she was given an appt w/ red team/ Dr. Howie Ill for 1415 today.  She is instructed if she develops worsening SHOB, or SHOB at rest or CP to go to the ED she verbalized understanding, but states "i'm not going there, I went last week" SChaplin, RN,BSN

## 2021-04-21 NOTE — Assessment & Plan Note (Addendum)
Patient presents due to difficulty swallowing secretions and feeling like she is choking.  She called the clinic last Monday and stated that she felt like her throat was closing and she was choking.  She was advised to go to the emergency department at that time.  In the ED patient was found not to have any signs concerning for esophageal obstruction or allergic reaction.  Chest x-ray at that time showed pulmonary hyperinflation without acute abnormality of the lungs.  ED provider concerned that change of medications from her lorazepam to buspar could have contributed to her presentation.  Physical exam reveals elderly woman with frequent productive coughing episodes.  Her lungs are clear bilaterally, pulmonary effort normal.    Assessment Patient presents with history of hypercontractility esophagus.  She states that she dramatically changed her diet to accommodate for this.  She drinks Ensure shakes as the main source of her nutrition.  Over the last week she states that she has had increased difficulty with managing secretions leading to coughing.  She called our clinic number on Sunday and was given liquid guafenesin and states that this seems to have helped her symptoms.  Symptoms likely secondary to hyper contractile esophagus causing difficulty swallowing secretions.  She was last seen by Dr. Ardis Hughs, gastroenterologist in May 2022.  She states that this time she was told the next step would be to have tube feeds, but this is not reflected in his note from that office visit.  Plan -Continue Hyoscyamine -Continue Guafenesin PRN -Message sent to Dr. Ardis Hughs to clarify next steps for patient and see if he can further evaluate patient. -Patient instructed to also call with Dr. Ardis Hughs office. -Requested second opinion for GI at last office visit in October, this appointment is with [facility] and is not scheduled until July 07, 2021.

## 2021-04-22 ENCOUNTER — Telehealth: Payer: Self-pay

## 2021-04-22 NOTE — Telephone Encounter (Signed)
Appt made to see Dr Ardis Hughs on 06/12/21 at 10:50 am.  Left message on machine to call back

## 2021-04-22 NOTE — Telephone Encounter (Signed)
-----   Message from Milus Banister, MD sent at 04/22/2021  8:23 AM EST ----- Regarding: RE: difficulty with secretions and increased phelgm Katie, Thanks for reaching out.  Her esophagus can spasm so significantly that it causes pains and can limit her oral intake.  Fortunately it looks like her weight is up a few pounds since her office visit with [place] over the spring.  I generally use weight gain, weight loss as a "vital sign" in people with esophageal, swallowing problems like hers.  If they are withering away because of that, significantly losing weight then a gastric tube for nutrition is usually a good idea.  If they are not withering away, holding their weights or even gaining weight then a feeding tube is probably not so important.  We will reach out to her to get her back into the office to discuss this further.  Thanks   Airyana Sprunger, She needs my first available return office visit for chronic swallowing difficulties  thanks ----- Message ----- From: Christiana Fuchs, DO Sent: 04/21/2021   6:02 PM EST To: Milus Banister, MD Subject: difficulty with secretions and increased phe#  Hi Dr. Ardis Hughs,  I saw Ms. Crumm in clinic yesterday.  She has a history of hypercontractile esophagus and has had an increase in her symptoms over the last few weeks.  I am not very familiar with this and would love to learn more.  From reviewing your last office note with her last spring, it sounds like this is a big change from then.  She currently is taking hyoscyamine.  She stated that in the past that you have said the next step is a feeding tube.  I did not see that reflected in your note and wanted to check in with you to see if she could be further evaluate by you.  Thank you,  Christiana Fuchs, DO, PGY1 [facility] Internal Medicine

## 2021-04-22 NOTE — Progress Notes (Signed)
Internal Medicine Clinic Attending  I saw and evaluated the patient.  I personally confirmed the key portions of the history and exam documented by Dr. Masters and I reviewed pertinent patient test results.  The assessment, diagnosis, and plan were formulated together and I agree with the documentation in the resident's note.  

## 2021-04-23 NOTE — Telephone Encounter (Signed)
The pt has returned call and is aware of the follow up appt.

## 2021-05-14 ENCOUNTER — Telehealth: Payer: Self-pay | Admitting: Gastroenterology

## 2021-05-14 NOTE — Telephone Encounter (Signed)
It seems the patient is having issues swallowing.  She is scheduled to follow up with you on 06-12-21.  She is requesting a liquid form of hyoscyamine.  I am not sure if insurance will cover it.    Please advise.  Thank you, Elmyra Ricks

## 2021-05-14 NOTE — Telephone Encounter (Signed)
Patient called stating that she needed a refill for Hyoscyamine. Stated that pharmacy has that in a liquid and that's what she prefers because she is having a hard time swallowing. Please advise.

## 2021-05-15 ENCOUNTER — Other Ambulatory Visit: Payer: Self-pay | Admitting: Gastroenterology

## 2021-05-15 ENCOUNTER — Other Ambulatory Visit: Payer: Self-pay

## 2021-05-15 MED ORDER — HYOSCYAMINE SULFATE ER 0.375 MG PO TB12: 0.3750 mg | ORAL_TABLET | Freq: Two times a day (BID) | ORAL | 3 refills | Status: DC

## 2021-05-15 MED ORDER — HYOSCYAMINE SULFATE 0.125 MG SL SUBL: 0.1250 mg | SUBLINGUAL_TABLET | Freq: Three times a day (TID) | SUBLINGUAL | 6 refills | Status: DC | PRN

## 2021-05-15 NOTE — Telephone Encounter (Signed)
Left VM for patient to call back

## 2021-05-15 NOTE — Telephone Encounter (Signed)
Called patient and let her know that medication had been sent to pharmacy.

## 2021-05-19 ENCOUNTER — Telehealth: Payer: Self-pay

## 2021-05-19 NOTE — Telephone Encounter (Signed)
We received a fax from [place] stating that [name] [name] had reported an allergy to Symax. I called her and she said that a whole pill of the symax 0.375mg  was too strong but that a 1/2 tablet at a time works perfectly. She has only 3 left. She takes 1/2 tablet BID of the symax 0.375mg  and is requesting we approve it. She has an upcoming office visit in January with Dr Ardis Hughs. I spoke with Dr Ardis Hughs RN Koren Shiver and she said it would be okay to approve the fax. I will [facility].

## 2021-06-03 DIAGNOSIS — L578 Other skin changes due to chronic exposure to nonionizing radiation: Secondary | ICD-10-CM | POA: Diagnosis not present

## 2021-06-03 DIAGNOSIS — L538 Other specified erythematous conditions: Secondary | ICD-10-CM | POA: Diagnosis not present

## 2021-06-03 DIAGNOSIS — Z789 Other specified health status: Secondary | ICD-10-CM | POA: Diagnosis not present

## 2021-06-03 DIAGNOSIS — L821 Other seborrheic keratosis: Secondary | ICD-10-CM | POA: Diagnosis not present

## 2021-06-04 ENCOUNTER — Encounter (HOSPITAL_COMMUNITY): Payer: Self-pay

## 2021-06-04 ENCOUNTER — Other Ambulatory Visit: Payer: Self-pay

## 2021-06-04 ENCOUNTER — Emergency Department (HOSPITAL_COMMUNITY): Payer: Medicare Other

## 2021-06-04 ENCOUNTER — Emergency Department (HOSPITAL_COMMUNITY)
Admission: EM | Admit: 2021-06-04 | Discharge: 2021-06-04 | Disposition: A | Discharge status: Home / Self Care | Payer: Medicare Other | Attending: Emergency Medicine | Admitting: Emergency Medicine

## 2021-06-04 DIAGNOSIS — Z87891 Personal history of nicotine dependence: Secondary | ICD-10-CM | POA: Diagnosis not present

## 2021-06-04 DIAGNOSIS — I700 Atherosclerosis of aorta: Secondary | ICD-10-CM | POA: Diagnosis not present

## 2021-06-04 DIAGNOSIS — R531 Weakness: Secondary | ICD-10-CM | POA: Diagnosis not present

## 2021-06-04 DIAGNOSIS — J449 Chronic obstructive pulmonary disease, unspecified: Secondary | ICD-10-CM | POA: Diagnosis not present

## 2021-06-04 DIAGNOSIS — R0789 Other chest pain: Secondary | ICD-10-CM | POA: Insufficient documentation

## 2021-06-04 DIAGNOSIS — R079 Chest pain, unspecified: Secondary | ICD-10-CM | POA: Diagnosis not present

## 2021-06-04 DIAGNOSIS — R252 Cramp and spasm: Secondary | ICD-10-CM | POA: Diagnosis present

## 2021-06-04 DIAGNOSIS — Z743 Need for continuous supervision: Secondary | ICD-10-CM | POA: Diagnosis not present

## 2021-06-04 DIAGNOSIS — Z79899 Other long term (current) drug therapy: Secondary | ICD-10-CM | POA: Insufficient documentation

## 2021-06-04 LAB — BASIC METABOLIC PANEL
Anion gap: 10 (ref 5–15)
BUN: 14 mg/dL (ref 8–23)
CO2: 28 mmol/L (ref 22–32)
Calcium: 9.5 mg/dL (ref 8.9–10.3)
Chloride: 99 mmol/L (ref 98–111)
Creatinine, Ser: 0.7 mg/dL (ref 0.44–1.00)
GFR, Estimated: >60 mL/min (ref >60)
Glucose, Bld: 104 mg/dL — ABNORMAL HIGH (ref 70–99)
Potassium: 4 mmol/L (ref 3.5–5.1)
Sodium: 137 mmol/L (ref 135–145)

## 2021-06-04 LAB — HEPATIC FUNCTION PANEL
ALT: 12 U/L (ref 0–44)
AST: 21 U/L (ref 15–41)
Albumin: 4.1 g/dL (ref 3.5–5.0)
Alkaline Phosphatase: 65 U/L (ref 38–126)
Bilirubin, Direct: <0.1 mg/dL (ref 0.0–0.2)
Total Bilirubin: 0.4 mg/dL (ref 0.3–1.2)
Total Protein: 6.9 g/dL (ref 6.5–8.1)

## 2021-06-04 LAB — CBC
HCT: 38.2 % (ref 36.0–46.0)
Hemoglobin: 12.6 g/dL (ref 12.0–15.0)
MCH: 31.5 pg (ref 26.0–34.0)
MCHC: 33 g/dL (ref 30.0–36.0)
MCV: 95.5 fL (ref 80.0–100.0)
Platelets: 238 10*3/uL (ref 150–400)
RBC: 4 MIL/uL (ref 3.87–5.11)
RDW: 13.2 % (ref 11.5–15.5)
WBC: 3.9 10*3/uL — ABNORMAL LOW (ref 4.0–10.5)
nRBC: 0 % (ref 0.0–0.2)

## 2021-06-04 LAB — TROPONIN I (HIGH SENSITIVITY)
Troponin I (High Sensitivity): 4 ng/L (ref <18)
Troponin I (High Sensitivity): 4 ng/L (ref <18)

## 2021-06-04 LAB — LIPASE, BLOOD: Lipase: 35 U/L (ref 11–51)

## 2021-06-04 MED ORDER — HYOSCYAMINE SULFATE 0.125 MG/5ML PO ELIX: 0.1250 mg | ORAL_SOLUTION | Freq: Four times a day (QID) | ORAL | 0 refills | Status: DC | PRN

## 2021-06-04 NOTE — ED Notes (Signed)
Patient verbalizes understanding of d/c instructions. Opportunities for questions and answers were provided. Pt d/c from ED and wheeled to lobby where son is picking pt up.

## 2021-06-04 NOTE — Discharge Instructions (Signed)
As discussed, your evaluation today has been largely reassuring.  But, it is important that you monitor your condition carefully, and do not hesitate to return to the ED if you develop new, or concerning changes in your condition.  A new prescription has been sent to your pharmacy.  This should replace your current antispasmodic in a more comfortable formulation.  Otherwise, please follow-up with your physician for appropriate ongoing care.

## 2021-06-04 NOTE — ED Provider Notes (Signed)
[facility] EMERGENCY DEPARTMENT Provider Note   CSN: [id number] Arrival date & time: 06/04/21  1158     History  Chief Complaint  Patient presents with   Spasms    [name] [name] is a 88 y.o. female.  HPI Patient presents with concern for chest pain.  Actually symptoms resolved prior to my evaluation, but she notes that where she typically has some episodes of spasm today she had a prolonged episode of discomfort in her chest.  Describes it as a tight, squeezing sensation similar to multiple prior experiences of what has been described as esophageal spasm. Additional details obtained on chart review, the patient has been seen and evaluated by GI multiple times has had interventions, is currently on an antispasmodic.  Today, no fever, no vomiting, and symptoms improved after taking a dose of her typical medication.    Home Medications Prior to Admission medications   Medication Sig Start Date End Date Taking? Authorizing Provider  hyoscyamine (LEVSIN) 0.125 MG/5ML ELIX Take 5 mLs (0.125 mg total) by mouth every 6 (six) hours as needed. 06/04/21  Yes Carmin Muskrat, MD  AMBULATORY NON FORMULARY MEDICATION Peppermint altoids 1-2 tabs as needed    [provider]  bismuth subsalicylate (PEPTO BISMOL) 262 MG/15ML suspension Take 30 mLs by mouth as needed.    [provider]  flecainide (TAMBOCOR) 50 MG tablet TAKE 2 TABLETS EVERY MORNING AND 1 TABLET AT NIGHT 03/31/21   Evans Lance, MD  Guaifenesin 200 MG/5ML LIQD Take 5 mLs (200 mg total) by mouth every 4 (four) hours as needed. 04/19/21   Demaio, Alexa, MD  LORazepam (ATIVAN) 0.5 MG tablet Take 2 tablets (1 mg total) by mouth 2 (two) times daily as needed for anxiety. 04/13/21   Daleen Bo, MD  metoprolol succinate (TOPROL XL) 25 MG 24 hr tablet May take 1/2 tablet by mouth daily for breakthrough palpitations. 04/22/20   Evans Lance, MD  nitroGLYCERIN (NITROSTAT) 0.3 MG SL tablet Place 1  tablet (0.3 mg total) under the tongue every 5 (five) minutes as needed (esophageal pain. Do not take any more than 3 doses in 24 hours.). 03/11/20 03/11/21  [name], MD  nystatin cream (MYCOSTATIN) Apply 1 application topically 2 (two) times daily. 02/19/21   [name], MD      Allergies    Other, Pantoprazole, Zyrtec [cetirizine], Ciprofloxacin, and Nifedipine    Review of Systems   Review of Systems  All other systems reviewed and are negative.  Physical Exam Updated Vital Signs BP (!) 143/68    Pulse 66    Temp 98.7 F (37.1 C)    Resp 16    Ht 5\' 5"  (1.651 m)    Wt 57.6 kg    LMP 05/24/1970    SpO2 95%    BMI 21.13 kg/m  Physical Exam Vitals and nursing note reviewed.  Constitutional:      General: She is not in acute distress.    Appearance: She is well-developed. She is not ill-appearing, toxic-appearing or diaphoretic.  HENT:     Head: Normocephalic and atraumatic.  Eyes:     Conjunctiva/sclera: Conjunctivae normal.  Cardiovascular:     Rate and Rhythm: Normal rate and regular rhythm.  Pulmonary:     Effort: Pulmonary effort is normal. No respiratory distress.     Breath sounds: Normal breath sounds. No stridor.  Abdominal:     General: There is no distension.  Skin:    General: Skin is  warm and dry.  Neurological:     Mental Status: She is alert and oriented to person, place, and time.     Cranial Nerves: No cranial nerve deficit.    ED Results / Procedures / Treatments   Labs (all labs ordered are listed, but only abnormal results are displayed) Labs Reviewed  BASIC METABOLIC PANEL - Abnormal; Notable for the following components:      Result Value   Glucose, Bld 104 (*)    All other components within normal limits  CBC - Abnormal; Notable for the following components:   WBC 3.9 (*)    All other components within normal limits  HEPATIC FUNCTION PANEL  LIPASE, BLOOD  TROPONIN I (HIGH SENSITIVITY)  TROPONIN I (HIGH SENSITIVITY)    EKG Sinus  rhythm, rate 65, unremarkable.  Radiology DG Chest 2 View  Result Date: 06/04/2021 CLINICAL DATA:  Chest pain, history hypertension, former smoker EXAM: CHEST - 2 VIEW COMPARISON:  04/13/2021 FINDINGS: Normal heart size, mediastinal contours, and pulmonary vascularity. Atherosclerotic calcification aorta. Lungs hyperinflated with mild central peribronchial thickening consistent with COPD. No acute infiltrate, pleural effusion, or pneumothorax. Bones demineralized. IMPRESSION: Hyperinflated lungs with peribronchial thickening consistent with COPD. No acute infiltrate. Aortic Atherosclerosis (ICD10-I70.0) Electronically Signed   By: Lavonia Dana M.D.   On: 06/04/2021 13:00    Procedures Procedures    Medications Ordered in ED Medications - No data to display  ED Course/ Medical Decision Making/ A&P Reviewed and patient is awake, alert, no distress we discussed today's evaluation, and I have interpreted her x-ray, negative for pneumonia, subjective of COPD, but with no respiratory complaints, hypoxia, increased work of breathing, not an active issue.  Patient's EKG reviewed, nonischemic, and on hours of monitoring she has had no decompensation, no arrhythmia, no evidence for ischemia. Some suspicion for the patient's baseline esophageal pathology contributing to today's episode.  Chart review performed, notable for notes from her gastroenterologist, Dr. Ardis Hughs with initiation of antispasmodic. With reassuring findings here, low suspicion for concurrent infection, ischemia, bacteremia, sepsis as above, patient discharged with new medication, outpatient follow-up        Final Clinical Impression(s) / ED Diagnoses Final diagnoses:  Atypical chest pain    Rx / DC Orders ED Discharge Orders          Ordered    hyoscyamine (LEVSIN) 0.125 MG/5ML ELIX  Every 6 hours PRN        06/04/21 2113              Carmin Muskrat, MD 06/04/21 2324

## 2021-06-04 NOTE — ED Provider Triage Note (Signed)
Emergency Medicine Provider Triage Evaluation Note  [name] [name] , a 88 y.o. female  was evaluated in triage.  Pt complains of esophageal spasms that started this morning. Spasms originate in epigastric region and radiate up to throat. These symptoms are much worse than normal. It is not unusual for patient to have esophageal spasms, but she was concerned bc symptoms were worse. The symptoms are much better now, but she still feels like something is stuck in her epigastric region. Recent dx of spenic artery aneurism.   Review of Systems  Positive:  Negative:   Physical Exam  BP (!) 150/65 (BP Location: Left Arm)    Pulse 66    Temp 97.6 F (36.4 C) (Oral)    Resp 15    Ht 5\' 5"  (1.651 m)    Wt 57.6 kg    LMP 05/24/1970    SpO2 98%    BMI 21.13 kg/m  Gen:   Awake, no distress   Resp:  Normal effort  MSK:   Moves extremities without difficulty  Other:  Abdomen, soft, nontender  Medical Decision Making  Medically screening exam initiated at 12:35 PM.  Appropriate orders placed.  [name] [name] was informed that the remainder of the evaluation will be completed by another provider, this initial triage assessment does not replace that evaluation, and the importance of remaining in the ED until their evaluation is complete.  ACS rule out   Adolphus Birchwood, PA-C 06/04/21 1237

## 2021-06-04 NOTE — ED Triage Notes (Signed)
Pt BIB [facility] from home c/o esophageal spams. Pt states she has been dealing with them for a year and half but this morning she felt like she was going to choke. Pt states the feelings has subsided now.

## 2021-06-08 ENCOUNTER — Ambulatory Visit (HOSPITAL_COMMUNITY)
Admission: EM | Admit: 2021-06-08 | Discharge: 2021-06-08 | Disposition: A | Discharge status: Home / Self Care | Payer: Medicare Other | Attending: Internal Medicine | Admitting: Internal Medicine

## 2021-06-08 ENCOUNTER — Other Ambulatory Visit: Payer: Self-pay

## 2021-06-08 ENCOUNTER — Telehealth: Payer: Self-pay | Admitting: Student

## 2021-06-08 ENCOUNTER — Encounter (HOSPITAL_COMMUNITY): Payer: Self-pay | Admitting: Emergency Medicine

## 2021-06-08 DIAGNOSIS — R0982 Postnasal drip: Secondary | ICD-10-CM

## 2021-06-08 MED ORDER — ALBUTEROL SULFATE HFA 108 (90 BASE) MCG/ACT IN AERS: 1.0000 | INHALATION_SPRAY | Freq: Four times a day (QID) | RESPIRATORY_TRACT | 0 refills | Status: DC | PRN

## 2021-06-08 MED ORDER — FLUTICASONE PROPIONATE 50 MCG/ACT NA SUSP: 1.0000 | Freq: Every day | NASAL | 0 refills | Status: DC

## 2021-06-08 MED ORDER — BENZONATATE 100 MG PO CAPS: 100.0000 mg | ORAL_CAPSULE | Freq: Three times a day (TID) | ORAL | 0 refills | Status: DC

## 2021-06-08 MED ORDER — SALINE SPRAY 0.65 % NA SOLN: 1.0000 | NASAL | 0 refills | Status: DC | PRN

## 2021-06-08 NOTE — Telephone Encounter (Signed)
Received on-call page from [name]. She reports she was recently in the ED for chest pain, likely due to esophageal spasms. Since this time she reports she is having increased cough and nasal congestion. No known subjective fevers, although she mentions she has been feeling warm. Over the phone, she sounds very dyspneic and tired. She has some anxiety returning to the ED after her 10 hour wait last week. I encouraged her to go to the Emergency Department or Urgent Care to be seen. She agreed to the plan.  Sanjuan Dame, MD Internal Medicine PGY-2 [phone]

## 2021-06-08 NOTE — ED Triage Notes (Signed)
Went to ER Thursday for "phlegm" and "choking".   She is requesting medicine for congestion and phlegm. Requests liquid medicine or something that can be crushed.

## 2021-06-08 NOTE — Discharge Instructions (Addendum)
Please use medications as prescribed Humidifier use will help with symptoms If symptoms worsen please return to urgent care to be reevaluated Lung exam is unremarkable.

## 2021-06-10 NOTE — ED Provider Notes (Signed)
[facility]    CSN: [id number] Arrival date & time: 06/08/21  1859      History   Chief Complaint Chief Complaint  Patient presents with   Nasal Congestion   Cough    HPI [name] [name] is a 88 y.o. female comes to the urgent care with a 3 to 4-day history of ongoing cough, postnasal drip with choking sensation.  Patient denies any shortness of breath.  She has been trying over-the-counter medications with no improvement in symptoms.  Patient was seen in the emergency department few days ago for the same symptoms.  She was discharged home with medications for supportive care.  Patient endorses that she is unable to sleep at night because of postnasal drip and coughing.  No fever or chills.  Sputum is not thick.  No chest tightness.  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   Vitamin D deficiency    2010    Patient Active Problem List   Diagnosis Date Noted   Splenic artery aneurysm ([place]) 04/02/2021   Common bile duct dilation 04/02/2021   Night sweats 02/12/2021   Abdominal pain, chronic, epigastric    Dysphagia 02/05/2020   Gastritis and gastroduodenitis    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   Venous insufficiency of both lower extremities 06/04/2019   Hyperlipidemia 06/04/2019   History of repair of rectocele 03/13/2019   Dysuria 03/07/2019   Rotator cuff arthropathy, right 04/08/2016   Arthritis of knee 03/25/2016   Presbycusis of both ears 01/15/2016   Skin cancer of arm  09/26/2014   History of TIA (transient ischemic attack) 07/19/2011   ATRIAL PREMATURE BEATS 03/20/2009   ANEMIA-IRON DEFICIENCY 04/02/2008   Anxiety 04/02/2008   Essential hypertension 04/02/2008   Osteoporosis 04/02/2008    Past Surgical History:  Procedure Laterality Date   A&P, enterocele repair     s/p 2003   ABDOMINAL HYSTERECTOMY     BIOPSY  02/05/2020   Procedure: BIOPSY;  Surgeon: Thornton Park, MD;  Location: [facility];  Service: Gastroenterology;;   BREAST REDUCTION SURGERY     s/p 1996      PATIENT DENIES BREAST REDUCTION    ESOPHAGEAL DILATION  02/05/2020   Procedure: ESOPHAGEAL DILATION;  Surgeon: Thornton Park, MD;  Location: [facility];  Service: Gastroenterology;;   ESOPHAGEAL MANOMETRY N/A 05/07/2020   Procedure: ESOPHAGEAL MANOMETRY (EM);  Surgeon: Milus Banister, MD;  Location: [facility];  Service: Endoscopy;  Laterality: N/A;   ESOPHAGOGASTRODUODENOSCOPY (EGD) WITH PROPOFOL N/A 02/05/2020   Procedure: ESOPHAGOGASTRODUODENOSCOPY (EGD) WITH PROPOFOL;  Surgeon: Thornton Park, MD;  Location: [facility];  Service: Gastroenterology;  Laterality: N/A;   HEMORRHOID SURGERY     LEFT HEART CATH AND CORONARY ANGIOGRAPHY N/A 05/25/2017   Procedure: LEFT HEART CATH AND CORONARY ANGIOGRAPHY;  Surgeon: Jettie Booze, MD;  Location: [facility];  Service: Cardiovascular;  Laterality: N/A;    OB History     Gravida  2   Para  2   Term      Preterm      AB      Living  1      SAB      IAB      Ectopic      Multiple      Live Births               Home Medications    Prior to Admission medications   Medication Sig Start Date End Date Taking? Authorizing Provider  albuterol (VENTOLIN HFA) 108 (90 Base) MCG/ACT inhaler Inhale 1-2 puffs into the lungs every 6 (six) hours as needed for wheezing or shortness of breath. 06/08/21  Yes Tanisia Yokley, Myrene Galas, MD  benzonatate (TESSALON) 100 MG capsule Take 1 capsule (100 mg total) by  mouth every 8 (eight) hours. 06/08/21  Yes Marzella Miracle, Myrene Galas, MD  fluticasone (FLONASE) 50 MCG/ACT nasal spray Place 1 spray into both nostrils daily. 06/08/21  Yes Meda Dudzinski, Myrene Galas, MD  sodium chloride (OCEAN) 0.65 % SOLN nasal spray Place 1 spray into both nostrils as needed for congestion. 06/08/21  Yes Tomisha Reppucci, Myrene Galas, MD  AMBULATORY NON FORMULARY MEDICATION Peppermint altoids 1-2 tabs as needed    [provider]  bismuth subsalicylate (PEPTO BISMOL) 262 MG/15ML suspension Take 30 mLs by mouth as needed.    [provider]  flecainide (TAMBOCOR) 50 MG tablet TAKE 2 TABLETS EVERY MORNING AND 1 TABLET AT NIGHT 03/31/21   Evans Lance, MD  Guaifenesin 200 MG/5ML LIQD Take 5 mLs (200 mg total) by mouth every 4 (four) hours as needed. 04/19/21   Demaio, Alexa, MD  hyoscyamine (LEVSIN) 0.125 MG/5ML ELIX Take 5 mLs (0.125 mg total) by mouth every 6 (six) hours as needed. 06/04/21   Carmin Muskrat, MD  LORazepam (ATIVAN) 0.5 MG tablet Take 2 tablets (1 mg total) by mouth 2 (two) times daily as needed for anxiety. 04/13/21   Daleen Bo, MD  metoprolol succinate (TOPROL XL) 25 MG 24 hr tablet May take 1/2 tablet by mouth daily for breakthrough palpitations. 04/22/20   Evans Lance, MD  nitroGLYCERIN (NITROSTAT) 0.3 MG SL tablet Place 1 tablet (0.3 mg total) under the tongue every 5 (five) minutes as needed (esophageal pain. Do not take any more than 3 doses in 24 hours.). 03/11/20 03/11/21  [name], MD  nystatin cream (MYCOSTATIN) Apply 1 application topically 2 (two) times daily. 02/19/21   [name], MD    Family History Family History  Problem Relation Age of Onset   Stroke Father        @ 74   Hypertension Father    Hypertension Sister    Cancer Sister 1       brain   Breast cancer Other 4       breast   Breast cancer Daughter 39       spread to her brain   Cancer Other 6       colon/MELANOMA   Stroke Mother        medication induced   Kidney  disease Brother    Cancer Brother     Social History Social History   Tobacco Use   Smoking status: Former    Types: Cigarettes    Quit date: 05/24/1968    Years since quitting: 53.0   Smokeless tobacco: Never   Tobacco comments:  only smoked 7 years  Vaping Use   Vaping Use: Never used  Substance Use Topics   Alcohol use: No    Alcohol/week: 0.0 standard drinks   Drug use: No     Allergies   Other, Pantoprazole, Zyrtec [cetirizine], Ciprofloxacin, and Nifedipine   Review of Systems Review of Systems As per HPI  Physical Exam Triage Vital Signs ED Triage Vitals  Enc Vitals Group     BP 06/08/21 1927 (!) 175/91     Pulse Rate 06/08/21 1927 75     Resp 06/08/21 1927 16     Temp 06/08/21 1927 98.3 F (36.8 C)     Temp Source 06/08/21 1927 Oral     SpO2 06/08/21 1927 97 %     Weight --      Height --      Head Circumference --      Peak Flow --      Pain Score 06/08/21 1925 0     Pain Loc --      Pain Edu? --      Excl. in [place]? --    No data found.  Updated Vital Signs BP (!) 175/91 (BP Location: Left Arm)    Pulse 75    Temp 98.3 F (36.8 C) (Oral)    Resp 16    LMP 05/24/1970    SpO2 97%   Visual Acuity Right Eye Distance:   Left Eye Distance:   Bilateral Distance:    Right Eye Near:   Left Eye Near:    Bilateral Near:     Physical Exam Vitals and nursing note reviewed.  Constitutional:      General: She is not in acute distress.    Appearance: She is not ill-appearing.  HENT:     Right Ear: Tympanic membrane normal.     Left Ear: Tympanic membrane normal.     Nose: Nose normal.     Mouth/Throat:     Mouth: Mucous membranes are moist.  Eyes:     Extraocular Movements: Extraocular movements intact.     Pupils: Pupils are equal, round, and reactive to light.  Cardiovascular:     Rate and Rhythm: Normal rate and regular rhythm.     Pulses: Normal pulses.     Heart sounds: Normal heart sounds.  Pulmonary:     Effort: Pulmonary effort is  normal.     Breath sounds: Normal breath sounds.  Abdominal:     General: Bowel sounds are normal.     Palpations: Abdomen is soft.  Neurological:     Mental Status: She is alert.     UC Treatments / Results  Labs (all labs ordered are listed, but only abnormal results are displayed) Labs Reviewed - No data to display  EKG   Radiology No results found.  Procedures Procedures (including critical care time)  Medications Ordered in UC Medications - No data to display  Initial Impression / Assessment and Plan / UC Course  I have reviewed the triage vital signs and the nursing notes.  Pertinent labs & imaging results that were available during my care of the patient were reviewed by me and considered in my medical decision making (see chart for details).     1.  Postnasal drip with cough: Flonase Tessalon Perles as needed for cough Saline nasal spray Humidifier use will help with symptoms Albuterol inhaler to help with difficulty breathing Return precautions given. Final Clinical Impressions(s) / UC Diagnoses   Final diagnoses:  Postnasal drip     Discharge Instructions      Please use medications as prescribed Humidifier use will help with symptoms If symptoms worsen please return to urgent care to be reevaluated Lung exam is unremarkable.   ED Prescriptions     Medication Sig Dispense Auth. Provider   benzonatate (TESSALON) 100 MG capsule Take 1 capsule (100 mg total) by mouth every 8 (eight) hours. 21 capsule Tangee Marszalek, Myrene Galas, MD   fluticasone (FLONASE) 50 MCG/ACT nasal spray Place 1 spray into both nostrils daily. 16 g Deaisha Welborn, Myrene Galas, MD   sodium chloride (OCEAN) 0.65 % SOLN nasal spray Place 1 spray into both nostrils as needed for congestion. -- Chase Picket, MD   albuterol (VENTOLIN HFA) 108 (90 Base) MCG/ACT inhaler Inhale 1-2 puffs into the lungs every 6 (six) hours as needed for wheezing or shortness of breath. 18 g Tauri Ethington, Myrene Galas, MD       PDMP not reviewed this encounter.   Chase Picket, MD 06/10/21 [phone]

## 2021-06-11 ENCOUNTER — Other Ambulatory Visit: Payer: Self-pay | Admitting: *Deleted

## 2021-06-11 NOTE — Telephone Encounter (Signed)
Patient called in requesting refill on Flonase be sent to [facility] on [address]. States she p/u bottle sent by UC on 06/08/2021 but wants to be sure she has refills when needed.  Also, states she was given Symax 0.375 mg tablets and she almost choked on them 2/2 problems with her esophagus. [facility] and they have an oral disintegrating tablet. She is requesting refill on this sent to [facility]. States pharmacist told her be sure to write "orally" on Rx.

## 2021-06-12 ENCOUNTER — Ambulatory Visit (INDEPENDENT_AMBULATORY_CARE_PROVIDER_SITE_OTHER): Payer: Medicare Other | Admitting: Internal Medicine

## 2021-06-12 ENCOUNTER — Telehealth: Payer: Self-pay | Admitting: Student

## 2021-06-12 ENCOUNTER — Other Ambulatory Visit: Payer: Self-pay

## 2021-06-12 ENCOUNTER — Encounter: Payer: Self-pay | Admitting: Internal Medicine

## 2021-06-12 ENCOUNTER — Ambulatory Visit: Payer: Medicare Other | Admitting: Gastroenterology

## 2021-06-12 VITALS — BP 134/72 | HR 86 | Ht 65.5 in | Wt 132.0 lb

## 2021-06-12 DIAGNOSIS — I491 Atrial premature depolarization: Secondary | ICD-10-CM

## 2021-06-12 MED ORDER — FLUTICASONE PROPIONATE 50 MCG/ACT NA SUSP: 1.0000 | Freq: Every day | NASAL | 0 refills | Status: DC

## 2021-06-12 NOTE — Telephone Encounter (Signed)
Ms. Christie Ordoyne contacted via the after-hours pager, but I was unable to reach her via telephone. Left VM for her to call back.

## 2021-06-12 NOTE — Patient Instructions (Signed)

## 2021-06-12 NOTE — Progress Notes (Signed)
HPI [name] [name] returns today for followup. She has a h/o PAF and palpitations. She has developed rhinorrhea since she got her booster for Covid. She also has a h/o chronic aphagia. She also has esophageal spasm. She has not had palpitations.  Allergies  Allergen Reactions   Other Anaphylaxis    Reaction to GI Cocktail   Pantoprazole Other (See Comments)    Upset stomach and weight loss   Zyrtec [Cetirizine] Palpitations   Ciprofloxacin     Rash with burning all over body   Nifedipine     Caused low BP     Current Outpatient Medications  Medication Sig Dispense Refill   albuterol (VENTOLIN HFA) 108 (90 Base) MCG/ACT inhaler Inhale 1-2 puffs into the lungs every 6 (six) hours as needed for wheezing or shortness of breath. 18 g 0   AMBULATORY NON FORMULARY MEDICATION Peppermint altoids 1-2 tabs as needed     benzonatate (TESSALON) 100 MG capsule Take 1 capsule (100 mg total) by mouth every 8 (eight) hours. 21 capsule 0   bismuth subsalicylate (PEPTO BISMOL) 262 MG/15ML suspension Take 30 mLs by mouth as needed.     flecainide (TAMBOCOR) 50 MG tablet TAKE 2 TABLETS EVERY MORNING AND 1 TABLET AT NIGHT 270 tablet 1   fluticasone (FLONASE) 50 MCG/ACT nasal spray Place 1 spray into both nostrils daily. 16 g 0   Guaifenesin 200 MG/5ML LIQD Take 5 mLs (200 mg total) by mouth every 4 (four) hours as needed. 420 mL 1   hyoscyamine (LEVSIN) 0.125 MG/5ML ELIX Take 5 mLs (0.125 mg total) by mouth every 6 (six) hours as needed. 473 mL 0   LORazepam (ATIVAN) 0.5 MG tablet Take 2 tablets (1 mg total) by mouth 2 (two) times daily as needed for anxiety. 15 tablet 0   metoprolol succinate (TOPROL XL) 25 MG 24 hr tablet May take 1/2 tablet by mouth daily for breakthrough palpitations. 45 tablet 3   nitroGLYCERIN (NITROSTAT) 0.3 MG SL tablet Place 1 tablet (0.3 mg total) under the tongue every 5 (five) minutes as needed (esophageal pain. Do not take any more than 3 doses in 24 hours.). 100 tablet 3    nystatin cream (MYCOSTATIN) Apply 1 application topically 2 (two) times daily. 30 g 0   sodium chloride (OCEAN) 0.65 % SOLN nasal spray Place 1 spray into both nostrils as needed for congestion.  0   No current facility-administered medications for this visit.     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   Past Surgical History:  Procedure Laterality Date   A&P, enterocele repair     s/p 2003   ABDOMINAL HYSTERECTOMY     BIOPSY  02/05/2020  Procedure: BIOPSY;  Surgeon: Thornton Park, MD;  Location: [facility] ENDOSCOPY;  Service: Gastroenterology;;   BREAST REDUCTION SURGERY     s/p 1996      PATIENT DENIES BREAST REDUCTION    ESOPHAGEAL DILATION  02/05/2020   Procedure: ESOPHAGEAL DILATION;  Surgeon: Thornton Park, MD;  Location: [facility] ENDOSCOPY;  Service: Gastroenterology;;   ESOPHAGEAL MANOMETRY N/A 05/07/2020   Procedure: ESOPHAGEAL MANOMETRY (EM);  Surgeon: Milus Banister, MD;  Location: [facility] ENDOSCOPY;  Service: Endoscopy;  Laterality: N/A;   ESOPHAGOGASTRODUODENOSCOPY (EGD) WITH PROPOFOL N/A 02/05/2020   Procedure: ESOPHAGOGASTRODUODENOSCOPY (EGD) WITH PROPOFOL;  Surgeon: Thornton Park, MD;  Location: [facility];  Service: Gastroenterology;  Laterality: N/A;   HEMORRHOID SURGERY     LEFT HEART CATH AND CORONARY ANGIOGRAPHY N/A 05/25/2017   Procedure: LEFT HEART CATH AND CORONARY ANGIOGRAPHY;   Surgeon: Jettie Booze, MD;  Location: [facility];  Service: Cardiovascular;  Laterality: N/A;     Family History  Problem Relation Age of Onset   Stroke Father        @ 52   Hypertension Father    Hypertension Sister    Cancer Sister 61       brain   Breast cancer Other 10       breast   Breast cancer Daughter 88       spread to her brain   Cancer Other 12       colon/MELANOMA   Stroke Mother        medication induced   Kidney disease Brother    Cancer Brother      Social History   Socioeconomic History   Marital status: Widowed    Spouse name: Not on file   Number of children: 2   Years of education: Not on file   Highest education level: Not on file  Occupational History   Occupation: Retired  Tobacco Use   Smoking status: Former    Types: Cigarettes    Quit date: 05/24/1968    Years since quitting: 53.0   Smokeless tobacco: Never   Tobacco comments:    only smoked 7 years  Vaping Use   Vaping Use: Never used  Substance and Sexual Activity   Alcohol use: No    Alcohol/week: 0.0 standard drinks   Drug use: No   Sexual activity: Never    Comment: 1st intercourse 57 yo-1 partner  Other Topics Concern   Not on file  Social History Narrative   Not on file   Social Determinants of Health   Financial Resource Strain: Not on file  Food Insecurity: Not on file  Transportation Needs: Not on file  Physical Activity: Not on file  Stress: Not on file  Social Connections: Not on file  Intimate Partner Violence: Not on file     BP 134/72    Pulse 86    Ht 5' 5.5" (1.664 m)    Wt 132 lb (59.9 kg)    LMP 05/24/1970    SpO2 97%    BMI 21.63 kg/m   Physical Exam:  Well appearing NAD HEENT: Unremarkable Neck:  No JVD, no thyromegally Lymphatics:  No adenopathy Back:  No CVA tenderness Lungs:  Clear with no wheezes HEART:  Regular rate rhythm, no murmurs, no rubs, no clicks Abd:  soft, positive bowel sounds, no organomegally, no rebound, no  guarding Ext:  2 plus pulses, no edema, no cyanosis, no clubbing Skin:  No rashes no nodules Neuro:  CN II through XII  intact, motor grossly intact   Assess/Plan:  Palpitations - these are well controlled. Continue low dose flecainide Rhinorrhea - I suspect that it will improve. Time course is related to Covid vaccine. Dysphagia - she has been followed by GI and is pending referral.   [name] [name]

## 2021-07-06 DIAGNOSIS — Z20822 Contact with and (suspected) exposure to covid-19: Secondary | ICD-10-CM | POA: Diagnosis not present

## 2021-07-07 DIAGNOSIS — K224 Dyskinesia of esophagus: Secondary | ICD-10-CM | POA: Diagnosis not present

## 2021-07-07 DIAGNOSIS — R0789 Other chest pain: Secondary | ICD-10-CM | POA: Diagnosis not present

## 2021-07-09 ENCOUNTER — Ambulatory Visit: Payer: Medicare Other | Admitting: Nurse Practitioner

## 2021-07-20 ENCOUNTER — Ambulatory Visit: Payer: Medicare Other | Admitting: Nurse Practitioner

## 2021-07-23 DIAGNOSIS — Z20822 Contact with and (suspected) exposure to covid-19: Secondary | ICD-10-CM | POA: Diagnosis not present

## 2021-08-11 DIAGNOSIS — Z20822 Contact with and (suspected) exposure to covid-19: Secondary | ICD-10-CM | POA: Diagnosis not present

## 2021-08-25 ENCOUNTER — Ambulatory Visit (INDEPENDENT_AMBULATORY_CARE_PROVIDER_SITE_OTHER): Payer: Medicare Other | Admitting: Internal Medicine

## 2021-08-25 VITALS — BP 148/83 | HR 80 | Temp 98.2°F | Ht 60.5 in | Wt 135.4 lb

## 2021-08-25 DIAGNOSIS — J301 Allergic rhinitis due to pollen: Secondary | ICD-10-CM | POA: Diagnosis not present

## 2021-08-25 DIAGNOSIS — J302 Other seasonal allergic rhinitis: Secondary | ICD-10-CM

## 2021-08-25 DIAGNOSIS — H6122 Impacted cerumen, left ear: Secondary | ICD-10-CM | POA: Diagnosis not present

## 2021-08-25 DIAGNOSIS — H9113 Presbycusis, bilateral: Secondary | ICD-10-CM | POA: Diagnosis not present

## 2021-08-25 MED ORDER — FLUTICASONE PROPIONATE 50 MCG/ACT NA SUSP: 1.0000 | Freq: Every day | NASAL | 0 refills | Status: DC

## 2021-08-25 MED ORDER — CETIRIZINE HCL 10 MG PO TABS: 10.0000 mg | ORAL_TABLET | Freq: Every day | ORAL | 2 refills | Status: DC

## 2021-08-25 NOTE — Patient Instructions (Signed)
Thank you, Ms.Odella Aquas Hughett for allowing [place] to provide your care today. Today we discussed allergic rhinitis.   ? ?Labs/Tests Ordered: ?Lab Orders  ?No laboratory test(s) ordered today  ?  ? ?Referrals Ordered:  ?Referral Orders  ?No referral(s) requested today  ?  ? ?Medication Changes:  ?Medications Discontinued During This Encounter  ?Medication Reason  ? fluticasone (FLONASE) 50 MCG/ACT nasal spray Reorder  ?  ? ?Meds ordered this encounter  ?Medications  ? cetirizine (ZYRTEC ALLERGY) 10 MG tablet  ?  Sig: Take 1 tablet (10 mg total) by mouth daily.  ?  Dispense:  30 tablet  ?  Refill:  2  ? fluticasone (FLONASE) 50 MCG/ACT nasal spray  ?  Sig: Place 1 spray into both nostrils daily.  ?  Dispense:  16 g  ?  Refill:  0  ?  ? ?Health Maintenance Screening: ?There are no preventive care reminders to display for this patient.  ? ?Instructions:  ?- try the back pressure exercises ?- Make sure to take your medications every day ? ?Follow up: 4-6 months  ? ?Remember: If you have any questions or concerns, call our clinic at [phone] or after hours call [phone] and ask for the internal medicine resident on call. ? ?[name], D.O. ?[employer] ? ? ? ?

## 2021-08-25 NOTE — Progress Notes (Signed)
? ? ?Subjective:  ?CC: Allergic rhinitis ? ?HPI: ? ?[name] [name] is a 88 y.o. female with a past medical history stated below and presents today for allergic rhinitis. Please see problem based assessment and plan for additional details. ? ?Past Medical History:  ?Diagnosis Date  ? Anemia   ? iron def  ? Anxiety   ? Arrhythmia   ? paroxysmal SVT and symtomatic PAC's  ? Diverticulosis   ? colon  ? Elevated troponin   ? GERD 04/02/2008  ? Qualifier: Diagnosis of  By: Jenny Reichmann MD, Hunt Oris   ? History of colonic polyps   ? cecal  ? Hypertension   ? 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 Left forearm - CX3 + 5FU  ? Osteoporosis 07/21/2011  ? t score -2.7  ? Palpitations 05/24/2017  ? Stricture esophagus   ? TMJ pain dysfunction syndrome 09/25/2018  ? Vitamin D deficiency   ? 2010  ? ? ?Current Outpatient Medications on File Prior to Visit  ?Medication Sig Dispense Refill  ? albuterol (VENTOLIN HFA) 108 (90 Base) MCG/ACT inhaler Inhale 1-2 puffs into the lungs every 6 (six) hours as needed for wheezing or shortness of breath. 18 g 0  ? AMBULATORY NON FORMULARY MEDICATION Peppermint altoids ?1-2 tabs as needed    ? benzonatate (TESSALON) 100 MG capsule Take 1 capsule (100 mg total) by mouth every 8 (eight) hours. 21 capsule 0  ? bismuth subsalicylate (PEPTO BISMOL) 262 MG/15ML suspension Take 30 mLs by mouth as needed.    ? flecainide (TAMBOCOR) 50 MG tablet TAKE 2 TABLETS EVERY MORNING AND 1 TABLET AT NIGHT 270 tablet 1  ? Guaifenesin 200 MG/5ML LIQD Take 5 mLs (200 mg total) by mouth every 4 (four) hours as needed. 420 mL 1  ? hyoscyamine (LEVSIN) 0.125 MG/5ML ELIX Take 5 mLs (0.125 mg total) by mouth every 6 (six) hours as needed. 473 mL 0  ? LORazepam (ATIVAN) 0.5 MG tablet Take 2 tablets (1 mg total) by mouth 2 (two) times daily as needed for anxiety. 15 tablet 0  ? metoprolol succinate (TOPROL XL) 25 MG 24 hr tablet May take 1/2 tablet by mouth daily for breakthrough palpitations. 45 tablet 3   ? nitroGLYCERIN (NITROSTAT) 0.3 MG SL tablet Place 1 tablet (0.3 mg total) under the tongue every 5 (five) minutes as needed (esophageal pain. Do not take any more than 3 doses in 24 hours.). 100 tablet 3  ? nystatin cream (MYCOSTATIN) Apply 1 application topically 2 (two) times daily. 30 g 0  ? sodium chloride (OCEAN) 0.65 % SOLN nasal spray Place 1 spray into both nostrils as needed for congestion.  0  ? ?No current facility-administered medications on file prior to visit.  ? ? ?Family History  ?Problem Relation Age of Onset  ? Stroke Father   ?     @ 3  ? Hypertension Father   ? Hypertension Sister   ? Cancer Sister 10  ?     brain  ? Breast cancer Other 65  ?     breast  ? Breast cancer Daughter 84  ?     spread to her brain  ? Cancer Other 58  ?     colon/MELANOMA  ? Stroke Mother   ?     medication induced  ? Kidney disease Brother   ? Cancer Brother   ? ? ?Social History  ? ?Socioeconomic History  ? Marital status: Widowed  ?  Spouse name: Not on file  ? Number of children: 2  ? Years of education: Not on file  ? Highest education level: Not on file  ?Occupational History  ? Occupation: Retired  ?Tobacco Use  ? Smoking status: Former  ?  Types: Cigarettes  ?  Quit date: 05/24/1968  ?  Years since quitting: 53.2  ? Smokeless tobacco: Never  ? Tobacco comments:  ?  only smoked 7 years  ?Vaping Use  ? Vaping Use: Never used  ?Substance and Sexual Activity  ? Alcohol use: No  ?  Alcohol/week: 0.0 standard drinks  ? Drug use: No  ? Sexual activity: Never  ?  Comment: 1st intercourse 55 yo-1 partner  ?Other Topics Concern  ? Not on file  ?Social History Narrative  ? Not on file  ? ?Social Determinants of Health  ? ?Financial Resource Strain: Not on file  ?Food Insecurity: Not on file  ?Transportation Needs: Not on file  ?Physical Activity: Not on file  ?Stress: Not on file  ?Social Connections: Not on file  ?Intimate Partner Violence: Not on file  ? ? ?Review of Systems: ?ROS negative except for what is noted on  the assessment and plan. ? ?Objective:  ? ?Vitals:  ? 08/25/21 1545  ?BP: (!) 148/83  ?Pulse: 80  ?Temp: 98.2 ?F (36.8 ?C)  ?TempSrc: Oral  ?SpO2: 100%  ?Weight: 135 lb 6.4 oz (61.4 kg)  ?Height: 5' 0.5" (1.537 m)  ? ? ?Physical Exam: ?Gen: A&O x3 and in no apparent distress, well appearing and nourished. ?HEENT:  ?  Head - normocephalic, atraumatic.  ?  Eye - visual acuity grossly intact, conjunctiva injected, sclera non-icteric, EOM intact.  ?  Mouth - No obvious caries or periodontal disease.  No significant exudates or drainage minimal erythema ?  Nose -edematous and boggy turbinates with some erythema and mucus ?Neck: no masses or nodules, AROM intact. ?CV: RRR, no murmurs, S1/S2 presents  ?Resp: Clear to ascultation bilaterally  ?Skin: good skin turgor, no rashes, unusual bruising, or prominent lesions.  ?Neuro: No focal deficits, grossly normal sensation and coordination.  ?Psych: Oriented x3 and responding appropriately. Intact memory, anxious mood ? ? ?Assessment & Plan:  ?See Encounters Tab for problem based charting. ? ?Patient discussed with Dr. Jimmye Norman ? ? ?[name], D.O. ?[facility]  PGY-3 ?Pager: [phone]  Phone: [phone] ?Date 08/27/2021  Time 11:39 AM  ?

## 2021-08-27 ENCOUNTER — Encounter: Payer: Self-pay | Admitting: Internal Medicine

## 2021-08-27 DIAGNOSIS — H6120 Impacted cerumen, unspecified ear: Secondary | ICD-10-CM

## 2021-08-27 DIAGNOSIS — J309 Allergic rhinitis, unspecified: Secondary | ICD-10-CM | POA: Insufficient documentation

## 2021-08-27 HISTORY — DX: Impacted cerumen, unspecified ear: H61.20

## 2021-08-27 NOTE — Assessment & Plan Note (Signed)
Patient presented with evidence of cerumen impaction of her left ear.  The cerumen was manually disimpacted with otic lavage.  Patient tolerated the procedure well with resolution of the cerumen impaction.  She did have some excoriation within the external ear canal without any evidence of purulence.  Patient counseled to avoid using any cotton Q-tips or inserting any foreign objects into the ear while healing. Patient was given return precautions regarding increased pain or exudate from that ear.  ?

## 2021-08-27 NOTE — Assessment & Plan Note (Signed)
HPI: ?Patient presents for further evaluation and management of allergic rhinitis.  Patient states that she has been having increased cough with mucus production, rhinorrhea, postnasal drip with scratchy throat, itchy watery eyes, and tinnitus of her right ear.  The symptoms have been going on for several weeks now.  She denies any signs or symptoms of systemic infection such as fevers, chills, shortness of breath, wheezing.  She has no sick contacts or recent travel. ? ?On evaluation, patient does have evidence of some fluid in her right ear.  Her nose showed boggy/edematous turbinates with mucus and some erythema.  No significant exudates in her throat and minimal erythema.  No lymphadenopathy present.  No obvious respiratory distress, wheezing, rhonchi, or rales. ? ? ?Assessment/Plan: ?Allergic rhinitis ?-Flonase daily ?- Zyrtec daily ? ?

## 2021-08-27 NOTE — Assessment & Plan Note (Signed)
Patietn would benefit form hearing aids. She will get hearing aids through [facility]  ?

## 2021-09-01 NOTE — Progress Notes (Signed)
Internal Medicine Clinic Attending  Case discussed with Dr. Coe  At the time of the visit.  We reviewed the resident's history and exam and pertinent patient test results.  I agree with the assessment, diagnosis, and plan of care documented in the resident's note.  

## 2021-09-07 ENCOUNTER — Telehealth: Payer: Self-pay | Admitting: Gastroenterology

## 2021-09-07 DIAGNOSIS — Z20822 Contact with and (suspected) exposure to covid-19: Secondary | ICD-10-CM | POA: Diagnosis not present

## 2021-09-07 NOTE — Telephone Encounter (Signed)
Inbound call from patient stating that she had been referred to Dr. Eliberto Ivory in [place] and they were needing her records from her Esophageal Manometry. Patient is seeking advice as to what she needs to do to get them those records. Please advise.  ?

## 2021-09-07 NOTE — Telephone Encounter (Signed)
Manometry has been sent to Dr Eliberto Ivory.  The pt has been advised.  ?

## 2021-09-08 ENCOUNTER — Telehealth: Payer: Self-pay | Admitting: Internal Medicine

## 2021-09-08 NOTE — Telephone Encounter (Signed)
Rec'd  a call from [facility].  This patient was seen by you on 08/25/2021 and mentioned she needed hearing aids and a test. ? ?A referral must be placed (per Ins) in order for  ?[facility] to see her.   ? ?Please advise if a referral can be placed. ? ? ?

## 2021-09-08 NOTE — Telephone Encounter (Signed)
I have resent with the pt demographics as requested.  ?

## 2021-09-08 NOTE — Telephone Encounter (Signed)
Inbound call from [facility] stating they had received patients records, but is requesting them be resent with patients name and dob. Stated it was not on paperwork. ? ?Re fax to: ?[phone] ? ?Att to Dr. Eliberto Ivory  ?

## 2021-09-09 ENCOUNTER — Telehealth: Payer: Self-pay | Admitting: Internal Medicine

## 2021-09-09 DIAGNOSIS — H9113 Presbycusis, bilateral: Secondary | ICD-10-CM

## 2021-09-09 NOTE — Telephone Encounter (Signed)
September 08, 2021 ?  ? ?CB ?  10:40 AM ?You routed this conversation to [name], MD   ?Me ?  ?CB ?  10:40 AM ?Note ?  ?Rec'd  a call from [facility].  This patient was seen by you on 08/25/2021 and mentioned she needed hearing aids and a test. ?  ?A referral must be placed (per Ins) in order for  ?[facility] to see her.   ?  ?Please advise if a referral can be placed. ?   ?  ? ?

## 2021-09-10 DIAGNOSIS — Z20822 Contact with and (suspected) exposure to covid-19: Secondary | ICD-10-CM | POA: Diagnosis not present

## 2021-09-11 NOTE — Telephone Encounter (Signed)
Yes a referral for audiology should be placed.

## 2021-09-13 ENCOUNTER — Encounter (HOSPITAL_COMMUNITY): Payer: Self-pay

## 2021-09-13 ENCOUNTER — Ambulatory Visit (HOSPITAL_COMMUNITY)
Admission: EM | Admit: 2021-09-13 | Discharge: 2021-09-13 | Disposition: A | Discharge status: Home / Self Care | Payer: Medicare Other | Attending: Emergency Medicine | Admitting: Emergency Medicine

## 2021-09-13 DIAGNOSIS — I499 Cardiac arrhythmia, unspecified: Secondary | ICD-10-CM

## 2021-09-13 DIAGNOSIS — Z760 Encounter for issue of repeat prescription: Secondary | ICD-10-CM

## 2021-09-13 MED ORDER — METOPROLOL SUCCINATE ER 25 MG PO TB24: ORAL_TABLET | ORAL | 0 refills | Status: DC

## 2021-09-13 NOTE — ED Provider Notes (Signed)
?[facility] ? ? ?[id number] ?09/13/21 Arrival Time: 4765 ? ? ?Chief Complaint  ?Patient presents with  ? Medication Refill  ? ? ? ?SUBJECTIVE: ?History from: patient. ? ?[name] [name] is a 88 y.o. female who presents to the urgent care with a complaint of medication refill.  States she has been taking metoprolol 25 mg for the past few years due to abnormal rhythm.  States she does have a refill with a short prescription that the medication will be mailed and therefore was told by PCP to come to the urgent care to get a refill.  Denies any alleviating or aggravating factors.  Denies HA, vision changes, dizziness, lightheadedness, chest pain, shortness of breath, numbness or tingling in extremities, abdominal pain,  ? ?ROS: As per HPI.  All other pertinent ROS negative.  ?   ? ?Past Medical History:  ?Diagnosis Date  ? Anemia   ? iron def  ? Anxiety   ? Arrhythmia   ? paroxysmal SVT and symtomatic PAC's  ? Diverticulosis   ? colon  ? Elevated troponin   ? GERD 04/02/2008  ? Qualifier: Diagnosis of  By: Jenny Reichmann MD, Hunt Oris   ? History of colonic polyps   ? cecal  ? Hypertension   ? 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 Left forearm - CX3 + 5FU  ? Osteoporosis 07/21/2011  ? t score -2.7  ? Palpitations 05/24/2017  ? Stricture esophagus   ? TMJ pain dysfunction syndrome 09/25/2018  ? Vitamin D deficiency   ? 2010  ? ?Past Surgical History:  ?Procedure Laterality Date  ? A&P, enterocele repair    ? s/p 2003  ? ABDOMINAL HYSTERECTOMY    ? BIOPSY  02/05/2020  ? Procedure: BIOPSY;  Surgeon: Thornton Park, MD;  Location: [facility];  Service: Gastroenterology;;  ? BREAST REDUCTION SURGERY    ? s/p 1996      PATIENT DENIES BREAST REDUCTION   ? ESOPHAGEAL DILATION  02/05/2020  ? Procedure: ESOPHAGEAL DILATION;  Surgeon: Thornton Park, MD;  Location: [facility];  Service: Gastroenterology;;  ? ESOPHAGEAL MANOMETRY N/A 05/07/2020  ? Procedure: ESOPHAGEAL MANOMETRY (EM);  Surgeon: Milus Banister, MD;  Location: [facility] ENDOSCOPY;  Service: Endoscopy;  Laterality: N/A;  ? ESOPHAGOGASTRODUODENOSCOPY (EGD) WITH PROPOFOL N/A 02/05/2020  ? Procedure: ESOPHAGOGASTRODUODENOSCOPY (EGD) WITH PROPOFOL;  Surgeon: Thornton Park, MD;  Location: [facility];  Service: Gastroenterology;  Laterality: N/A;  ? HEMORRHOID SURGERY    ? LEFT HEART CATH AND CORONARY ANGIOGRAPHY N/A 05/25/2017  ? Procedure: LEFT HEART CATH AND CORONARY ANGIOGRAPHY;  Surgeon: Jettie Booze, MD;  Location: [facility];  Service: Cardiovascular;  Laterality: N/A;  ? ?Allergies  ?Allergen Reactions  ? Other Anaphylaxis  ?  Reaction to GI Cocktail  ? Pantoprazole Other (See Comments)  ?  Upset stomach and weight loss  ? Zyrtec [Cetirizine] Palpitations  ? Ciprofloxacin   ?  Rash with burning all over body  ? Nifedipine   ?  Caused low BP  ? ?No current facility-administered medications on file prior to encounter.  ? ?Current Outpatient Medications on File Prior to Encounter  ?Medication Sig Dispense Refill  ? albuterol (VENTOLIN HFA) 108 (90 Base) MCG/ACT inhaler Inhale 1-2 puffs into the lungs every 6 (six) hours as needed for wheezing or shortness of breath. 18 g 0  ? AMBULATORY NON FORMULARY MEDICATION Peppermint altoids ?1-2 tabs as needed    ? benzonatate (TESSALON) 100 MG capsule Take 1  capsule (100 mg total) by mouth every 8 (eight) hours. 21 capsule 0  ? bismuth subsalicylate (PEPTO BISMOL) 262 MG/15ML suspension Take 30 mLs by mouth as needed.    ? cetirizine (ZYRTEC ALLERGY) 10 MG tablet Take 1 tablet (10 mg total) by mouth daily. 30 tablet 2  ? flecainide (TAMBOCOR) 50 MG tablet TAKE 2 TABLETS EVERY MORNING AND 1 TABLET AT NIGHT 270 tablet 1  ? fluticasone (FLONASE) 50 MCG/ACT nasal spray Place 1 spray into both nostrils daily. 16 g 0  ? Guaifenesin 200 MG/5ML LIQD Take 5 mLs (200 mg total) by mouth every 4 (four) hours as needed. 420 mL 1  ? hyoscyamine (LEVSIN) 0.125 MG/5ML ELIX Take 5 mLs (0.125 mg total) by mouth  every 6 (six) hours as needed. 473 mL 0  ? LORazepam (ATIVAN) 0.5 MG tablet Take 2 tablets (1 mg total) by mouth 2 (two) times daily as needed for anxiety. 15 tablet 0  ? nitroGLYCERIN (NITROSTAT) 0.3 MG SL tablet Place 1 tablet (0.3 mg total) under the tongue every 5 (five) minutes as needed (esophageal pain. Do not take any more than 3 doses in 24 hours.). 100 tablet 3  ? nystatin cream (MYCOSTATIN) Apply 1 application topically 2 (two) times daily. 30 g 0  ? sodium chloride (OCEAN) 0.65 % SOLN nasal spray Place 1 spray into both nostrils as needed for congestion.  0  ? ?Social History  ? ?Socioeconomic History  ? Marital status: Widowed  ?  Spouse name: Not on file  ? Number of children: 2  ? Years of education: Not on file  ? Highest education level: Not on file  ?Occupational History  ? Occupation: Retired  ?Tobacco Use  ? Smoking status: Former  ?  Types: Cigarettes  ?  Quit date: 05/24/1968  ?  Years since quitting: 53.3  ? Smokeless tobacco: Never  ? Tobacco comments:  ?  only smoked 7 years  ?Vaping Use  ? Vaping Use: Never used  ?Substance and Sexual Activity  ? Alcohol use: No  ?  Alcohol/week: 0.0 standard drinks  ? Drug use: No  ? Sexual activity: Never  ?  Comment: 1st intercourse 24 yo-1 partner  ?Other Topics Concern  ? Not on file  ?Social History Narrative  ? Not on file  ? ?Social Determinants of Health  ? ?Financial Resource Strain: Not on file  ?Food Insecurity: Not on file  ?Transportation Needs: Not on file  ?Physical Activity: Not on file  ?Stress: Not on file  ?Social Connections: Not on file  ?Intimate Partner Violence: Not on file  ? ?Family History  ?Problem Relation Age of Onset  ? Stroke Father   ?     @ 62  ? Hypertension Father   ? Hypertension Sister   ? Cancer Sister 7  ?     brain  ? Breast cancer Other 45  ?     breast  ? Breast cancer Daughter 74  ?     spread to her brain  ? Cancer Other 58  ?     colon/MELANOMA  ? Stroke Mother   ?     medication induced  ? Kidney disease  Brother   ? Cancer Brother   ? ? ?OBJECTIVE: ? ?Vitals:  ? 09/13/21 1434  ?BP: (!) 155/78  ?Pulse: 77  ?Resp: 17  ?Temp: 98.1 ?F (36.7 ?C)  ?TempSrc: Oral  ?SpO2: 100%  ?  ? ?Physical Exam ?Vitals and nursing note reviewed.  ?  Constitutional:   ?   General: She is not in acute distress. ?   Appearance: Normal appearance. She is normal weight. She is not ill-appearing, toxic-appearing or diaphoretic.  ?HENT:  ?   Head: Normocephalic.  ?Cardiovascular:  ?   Rate and Rhythm: Normal rate. Rhythm irregular.  ?   Pulses: Normal pulses.  ?   Heart sounds: Normal heart sounds. No murmur heard. ?  No friction rub. No gallop.  ?Pulmonary:  ?   Effort: Pulmonary effort is normal. No respiratory distress.  ?   Breath sounds: Normal breath sounds. No stridor. No wheezing, rhonchi or rales.  ?Chest:  ?   Chest wall: No tenderness.  ?Neurological:  ?   Mental Status: She is alert and oriented to person, place, and time.  ?  ? ?LABS: ? ?No results found for this or any previous visit (from the past 24 hour(s)).  ? ?ASSESSMENT & PLAN: ? ?1. Medication refill   ? ? ?Meds ordered this encounter  ?Medications  ? metoprolol succinate (TOPROL XL) 25 MG 24 hr tablet  ?  Sig: May take 1/2 tablet by mouth daily for breakthrough palpitations.  ?  Dispense:  30 tablet  ?  Refill:  0  ? ? ?Discharge instructions  ? ?Take your medication as prescribed ?Follow-up with PCP ?Return or go to ED if you develop any new or worsening of your symptoms ? ? ?Reviewed expectations re: course of current medical issues. Questions answered. ?Outlined signs and symptoms indicating need for more acute intervention. ?Patient verbalized understanding. ?After Visit Summary given. ? ? ? ? ? ?  ?  ?Emerson Monte, FNP ?09/13/21 1457 ? ?

## 2021-09-13 NOTE — ED Triage Notes (Signed)
Pt presents today for med refill of toprol. [facility] and will not receive a refill for a few days. Pt reports her PCP recommended that she be seen for a refill. ?

## 2021-09-13 NOTE — Discharge Instructions (Addendum)
Take your medication as prescribed ?Follow-up with PCP ?Return or go to ED if you develop any new or worsening of your symptoms ?

## 2021-09-14 ENCOUNTER — Emergency Department (HOSPITAL_COMMUNITY): Payer: Medicare Other

## 2021-09-14 ENCOUNTER — Emergency Department (HOSPITAL_COMMUNITY)
Admission: EM | Admit: 2021-09-14 | Discharge: 2021-09-14 | Discharge status: Against Medical Advice | Payer: Medicare Other | Attending: Emergency Medicine | Admitting: Emergency Medicine

## 2021-09-14 ENCOUNTER — Other Ambulatory Visit: Payer: Self-pay

## 2021-09-14 DIAGNOSIS — J984 Other disorders of lung: Secondary | ICD-10-CM | POA: Diagnosis not present

## 2021-09-14 DIAGNOSIS — R102 Pelvic and perineal pain: Secondary | ICD-10-CM | POA: Diagnosis not present

## 2021-09-14 DIAGNOSIS — S199XXA Unspecified injury of neck, initial encounter: Secondary | ICD-10-CM | POA: Diagnosis not present

## 2021-09-14 DIAGNOSIS — R519 Headache, unspecified: Secondary | ICD-10-CM | POA: Insufficient documentation

## 2021-09-14 DIAGNOSIS — R110 Nausea: Secondary | ICD-10-CM | POA: Insufficient documentation

## 2021-09-14 DIAGNOSIS — R079 Chest pain, unspecified: Secondary | ICD-10-CM | POA: Diagnosis not present

## 2021-09-14 DIAGNOSIS — M50322 Other cervical disc degeneration at C5-C6 level: Secondary | ICD-10-CM | POA: Diagnosis not present

## 2021-09-14 DIAGNOSIS — M542 Cervicalgia: Secondary | ICD-10-CM | POA: Diagnosis not present

## 2021-09-14 DIAGNOSIS — M50321 Other cervical disc degeneration at C4-C5 level: Secondary | ICD-10-CM | POA: Diagnosis not present

## 2021-09-14 DIAGNOSIS — Z5321 Procedure and treatment not carried out due to patient leaving prior to being seen by health care provider: Secondary | ICD-10-CM | POA: Diagnosis not present

## 2021-09-14 DIAGNOSIS — R051 Acute cough: Secondary | ICD-10-CM | POA: Diagnosis not present

## 2021-09-14 DIAGNOSIS — G319 Degenerative disease of nervous system, unspecified: Secondary | ICD-10-CM | POA: Diagnosis not present

## 2021-09-14 DIAGNOSIS — Z20822 Contact with and (suspected) exposure to covid-19: Secondary | ICD-10-CM | POA: Diagnosis not present

## 2021-09-14 DIAGNOSIS — R059 Cough, unspecified: Secondary | ICD-10-CM | POA: Diagnosis not present

## 2021-09-14 DIAGNOSIS — S0990XA Unspecified injury of head, initial encounter: Secondary | ICD-10-CM | POA: Diagnosis not present

## 2021-09-14 DIAGNOSIS — J9811 Atelectasis: Secondary | ICD-10-CM | POA: Diagnosis not present

## 2021-09-14 DIAGNOSIS — I6782 Cerebral ischemia: Secondary | ICD-10-CM | POA: Diagnosis not present

## 2021-09-14 DIAGNOSIS — M533 Sacrococcygeal disorders, not elsewhere classified: Secondary | ICD-10-CM | POA: Diagnosis not present

## 2021-09-14 DIAGNOSIS — J329 Chronic sinusitis, unspecified: Secondary | ICD-10-CM | POA: Diagnosis not present

## 2021-09-14 MED ORDER — METOPROLOL SUCCINATE ER 25 MG PO TB24: ORAL_TABLET | ORAL | 2 refills | Status: DC

## 2021-09-14 NOTE — ED Triage Notes (Signed)
Pt reports reaching for a doorknob, missing the door knob and falling backwards onto her buttocks and hitting her head on the ground. No LOC and denies blood thinners. C/o headache, nausea, upset stomach, and "esophagus pain."   ?

## 2021-09-14 NOTE — ED Provider Triage Note (Signed)
Emergency Medicine Provider Triage Evaluation Note ? ?[name] [name] , a 88 y.o. female  was evaluated in triage.  Pt complains of injuries after fall.  Patient reports that this morning she went to reach for a doorknob and lost her balance after missing the doorknob.  Patient states that she fell backwards landing on her buttocks and hitting her head on wood floors.  Patient denies any loss of consciousness or blood thinners.  Patient reports that she has been dealing with pain to her coccyx, pelvis, chest, and head since her injury.   ? ?Review of Systems  ?Positive: Chest pain, pelvic pain, headache, ?Negative: Numbness, weakness, bowel/bladder dysfunction, visual disturbance, vomiting ? ?Physical Exam  ?BP 126/64 (BP Location: Right Arm)   Pulse 78   Temp 97.8 ?F (36.6 ?C) (Oral)   Resp 16   LMP 05/24/1970   SpO2 97%  ?Gen:   Awake, no distress   ?Resp:  Normal effort  ?MSK:   Moves extremities without difficulty; no midline tenderness or deformity to cervical, thoracic, or lumbar spine. ?Other:  Abdomen soft, nondistended, nontender no guarding or rebound tenderness.  Contusion to occipital region of scalp.  No battle signs. ? ?Medical Decision Making  ?Medically screening exam initiated at 2:46 PM.  Appropriate orders placed.  [name] Aquas Locatelli was informed that the remainder of the evaluation will be completed by another provider, this initial triage assessment does not replace that evaluation, and the importance of remaining in the ED until their evaluation is complete. ? ? ?  ?Loni Beckwith, PA-C ?09/14/21 1448 ? ?

## 2021-09-14 NOTE — ED Notes (Signed)
No response for vitals x1 ?

## 2021-09-14 NOTE — ED Notes (Signed)
Pt stated that she can not wait any longer and is having her son come pick her up.  ?

## 2021-09-22 ENCOUNTER — Other Ambulatory Visit: Payer: Self-pay | Admitting: Internal Medicine

## 2021-09-28 DIAGNOSIS — Z20822 Contact with and (suspected) exposure to covid-19: Secondary | ICD-10-CM | POA: Diagnosis not present

## 2021-10-06 DIAGNOSIS — K224 Dyskinesia of esophagus: Secondary | ICD-10-CM | POA: Diagnosis not present

## 2021-10-23 DIAGNOSIS — H9313 Tinnitus, bilateral: Secondary | ICD-10-CM | POA: Diagnosis not present

## 2021-10-23 DIAGNOSIS — H903 Sensorineural hearing loss, bilateral: Secondary | ICD-10-CM | POA: Diagnosis not present

## 2021-11-14 ENCOUNTER — Encounter: Payer: Self-pay | Admitting: *Deleted

## 2021-12-07 ENCOUNTER — Ambulatory Visit (INDEPENDENT_AMBULATORY_CARE_PROVIDER_SITE_OTHER): Payer: Medicare Other

## 2021-12-07 VITALS — BP 175/74 | HR 75 | Temp 98.1°F | Ht 60.0 in | Wt 140.7 lb

## 2021-12-07 DIAGNOSIS — Z0000 Encounter for general adult medical examination without abnormal findings: Secondary | ICD-10-CM

## 2021-12-07 DIAGNOSIS — I10 Essential (primary) hypertension: Secondary | ICD-10-CM

## 2021-12-07 DIAGNOSIS — M810 Age-related osteoporosis without current pathological fracture: Secondary | ICD-10-CM | POA: Diagnosis not present

## 2021-12-07 DIAGNOSIS — Z87891 Personal history of nicotine dependence: Secondary | ICD-10-CM

## 2021-12-07 DIAGNOSIS — I872 Venous insufficiency (chronic) (peripheral): Secondary | ICD-10-CM

## 2021-12-07 DIAGNOSIS — H9311 Tinnitus, right ear: Secondary | ICD-10-CM

## 2021-12-07 DIAGNOSIS — H9113 Presbycusis, bilateral: Secondary | ICD-10-CM | POA: Diagnosis not present

## 2021-12-07 NOTE — Assessment & Plan Note (Signed)
Patient found to have BP of 160/72 in clinic today, with repeat systolics to 259. This is consistent with age related isolated systolic hypertension. She says that her BP spikes at time but always normalizes when she measures it at home. In the past she has been told to discontinue BP meds due to hypotension. Will not add another medication to the regimen at this time. -Continue metoprolol 25 mg daily

## 2021-12-07 NOTE — Assessment & Plan Note (Signed)
Patient continues to have lower extremity swelling that worsens throughout the day and improves with elevation and overnight. She has a history of varicose veins treated surgically. These are consistent with venous incompetence and ROS for HF was negative. -continue compression stockings -elevate legs when resting

## 2021-12-07 NOTE — Progress Notes (Signed)
Subjective:   [name] [name] is a 88 y.o. female who presents for an Initial Medicare Annual Wellness Visit. I connected with  [name] [name] on 02/03/22 by a  IN PERSON [facility]      Patient Location: Other:  [facility]   Provider Location: Office/Clinic  I discussed the limitations of evaluation and management by telemedicine. The patient expressed understanding and agreed to proceed.   Review of Systems    DEFERRED  TO PCP  Cardiac Risk Factors include: advanced age (>16mn, >>77women);hypertension     Objective:    Today's Vitals   12/07/21 1401  PainSc: 6    There is no height or weight on file to calculate BMI.     12/07/2021    2:08 PM 08/25/2021    3:41 PM 04/13/2021   11:56 AM 04/02/2021   10:10 AM 02/19/2021    4:16 PM 04/24/2020    1:54 PM 03/14/2020   10:57 AM  Advanced Directives  Does Patient Have a Medical Advance Directive? No Yes No Yes Yes Yes Yes  Type of ASocial research officer, governmentLiving will  HFall BranchLiving will H[place]Living will H[place]yned[place]Living will HLenap[place]Living will  Does patient want to make changes to medical advance directive?  No - Patient declined  No - Patient declined  No - Patient declined   Copy of HCampbell Stationin Chart?  No - copy requested  No - copy requested No - copy requested Yes - validated most recent copy scanned in chart (See row information) Yes - validated most recent copy scanned in chart (See row information)  Would patient like information on creating a medical advance directive? No - Guardian declined No - Patient declined No - Patient declined   No - Patient declined No - Patient declined    Current Medications (verified) Outpatient Encounter Medications as of 12/07/2021  Medication Sig   albuterol (VENTOLIN HFA) 108 (90 Base) MCG/ACT inhaler Inhale 1-2 puffs into the lungs every 6 (six) hours as needed for wheezing or  shortness of breath.   AMBULATORY NON FORMULARY MEDICATION Peppermint altoids 1-2 tabs as needed   benzonatate (TESSALON) 100 MG capsule Take 1 capsule (100 mg total) by mouth every 8 (eight) hours.   bismuth subsalicylate (PEPTO BISMOL) 262 MG/15ML suspension Take 30 mLs by mouth as needed.   flecainide (TAMBOCOR) 50 MG tablet TAKE 2 TABLETS EVERY MORNING AND 1 TABLET AT NIGHT   fluticasone (FLONASE) 50 MCG/ACT nasal spray Place 1 spray into both nostrils daily.   Guaifenesin 200 MG/5ML LIQD Take 5 mLs (200 mg total) by mouth every 4 (four) hours as needed.   hyoscyamine (LEVSIN) 0.125 MG/5ML ELIX Take 5 mLs (0.125 mg total) by mouth every 6 (six) hours as needed.   LORazepam (ATIVAN) 0.5 MG tablet Take 2 tablets (1 mg total) by mouth 2 (two) times daily as needed for anxiety.   metoprolol succinate (TOPROL XL) 25 MG 24 hr tablet May take 1/2 tablet by mouth daily for breakthrough palpitations.   nystatin cream (MYCOSTATIN) Apply 1 application topically 2 (two) times daily.   sodium chloride (OCEAN) 0.65 % SOLN nasal spray Place 1 spray into both nostrils as needed for congestion.   cetirizine (ZYRTEC ALLERGY) 10 MG tablet Take 1 tablet (10 mg total) by mouth daily.   nitroGLYCERIN (NITROSTAT) 0.3 MG SL tablet Place 1 tablet (0.3 mg total) under the tongue every 5 (  five) minutes as needed (esophageal pain. Do not take any more than 3 doses in 24 hours.).   No facility-administered encounter medications on file as of 12/07/2021.    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  10/01/2014   Left forearm - CX3 + 5FU   Osteoporosis 07/21/2011   t score -2.7   Palpitations 05/24/2017   Stricture esophagus    TMJ pain dysfunction syndrome 09/25/2018   Vitamin D deficiency    2010   Past Surgical History:  Procedure Laterality Date   A&P, enterocele repair     s/p 2003   ABDOMINAL HYSTERECTOMY     BIOPSY  02/05/2020   Procedure: BIOPSY;  Surgeon: Thornton Park, MD;  Location: [facility] ENDOSCOPY;  Service: Gastroenterology;;   BREAST REDUCTION SURGERY     s/p 1996      PATIENT DENIES BREAST REDUCTION    ESOPHAGEAL DILATION  02/05/2020   Procedure: ESOPHAGEAL DILATION;  Surgeon: Thornton Park, MD;  Location: [facility] ENDOSCOPY;  Service: Gastroenterology;;   ESOPHAGEAL MANOMETRY N/A 05/07/2020   Procedure: ESOPHAGEAL MANOMETRY (EM);  Surgeon: Milus Banister, MD;  Location: [facility] ENDOSCOPY;  Service: Endoscopy;  Laterality: N/A;   ESOPHAGOGASTRODUODENOSCOPY (EGD) WITH PROPOFOL N/A 02/05/2020   Procedure: ESOPHAGOGASTRODUODENOSCOPY (EGD) WITH PROPOFOL;  Surgeon: Thornton Park, MD;  Location: [place];  Service: Gastroenterology;  Laterality: N/A;   HEMORRHOID SURGERY     LEFT HEART CATH AND CORONARY ANGIOGRAPHY N/A 05/25/2017   Procedure: LEFT HEART CATH AND CORONARY ANGIOGRAPHY;  Surgeon: Jettie Booze, MD;  Location: [place] CV LAB;  Service: Cardiovascular;  Laterality: N/A;   Family History  Problem Relation Age of Onset   Stroke Father        @ 62   Hypertension Father    Hypertension Sister    Cancer Sister 58       brain   Breast cancer Other 81       breast   Breast cancer Daughter 52       spread to her brain   Cancer Other 71       colon/MELANOMA   Stroke Mother        medication induced   Kidney disease Brother    Cancer Brother    Social History   Socioeconomic History   Marital status: Widowed    Spouse name: Not on file   Number of children: 2   Years of education: Not on file   Highest education level: Not on file   Occupational History   Occupation: Retired  Tobacco Use   Smoking status: Former    Types: Cigarettes    Quit date: 05/24/1968    Years since quitting: 53.7   Smokeless tobacco: Never   Tobacco comments:    only smoked 7 years  Vaping Use   Vaping Use: Never used  Substance and Sexual Activity   Alcohol use: No    Alcohol/week: 0.0 standard drinks of alcohol   Drug use: No   Sexual activity: Never  Comment: 1st intercourse 83 yo-1 partner  Other Topics Concern   Not on file  Social History Narrative   Not on file   Social Determinants of Health   Financial Resource Strain: Low Risk  (12/07/2021)   Overall Financial Resource Strain (CARDIA)    Difficulty of Paying Living Expenses: Not hard at all  Food Insecurity: No Food Insecurity (12/07/2021)   Hunger Vital Sign    Worried About Running Out of Food in the Last Year: Never true    Ran Out of Food in the Last Year: Never true  Transportation Needs: No Transportation Needs (12/07/2021)   PRAPARE - Hydrologist (Medical): No    Lack of Transportation (Non-Medical): No  Physical Activity: Insufficiently Active (12/07/2021)   Exercise Vital Sign    Days of Exercise per Week: 7 days    Minutes of Exercise per Session: 10 min  Stress: No Stress Concern Present (12/07/2021)   [place]    Feeling of Stress : Not at all  Social Connections: Socially Isolated (12/07/2021)   Social Connection and Isolation Panel [NHANES]    Frequency of Communication with Friends and Family: Once a week    Frequency of Social Gatherings with Friends and Family: Once a week    Attends Religious Services: Never    Marine scientist or Organizations: No    Attends Archivist Meetings: Never    Marital Status: Widowed    Tobacco Counseling Counseling given: Not Answered Tobacco comments: only smoked 7 years   Clinical  Intake:  Pre-visit preparation completed: Yes  Pain : 0-10 Pain Score: 6  Pain Type: Chronic pain Pain Onset: Other (comment)     BMI - recorded: 27 Nutritional Status: BMI 25 -29 Overweight Nutritional Risks: None Diabetes: No  How often do you need to have someone help you when you read instructions, pamphlets, or other written materials from your doctor or pharmacy?: 1 - Never What is the last grade level you completed in school?: 10 GRADE  Diabetic? NO   Interpreter Needed?: No  Information entered by :: Colt Martelle   Activities of Daily Living    12/07/2021    2:08 PM 12/07/2021    1:17 PM  In your present state of health, do you have any difficulty performing the following activities:  Hearing? 1 1  Vision? 0 0  Difficulty concentrating or making decisions? 0 0  Walking or climbing stairs? 1 0  Dressing or bathing? 0 0  Doing errands, shopping? 0 0  Preparing Food and eating ? Y   Comment PT STATED THAT SHE IS ON A LIQUID DIET  DUE TO PROBLEMS WITH HER THROAT   Using the Toilet? N   In the past six months, have you accidently leaked urine? N   Do you have problems with loss of bowel control? N   Managing your Medications? N   Managing your Finances? N   Housekeeping or managing your Housekeeping? N     Patient Care Team: Iona Coach, MD as PCP - General Fontaine, Belinda Block, MD (Inactive) (Obstetrics and Gynecology) Evans Lance, MD (Cardiology)  Indicate any recent Medical Services you may have received from other than [facility] providers in the past year (date may be approximate).     Assessment:   This is a routine wellness examination for Dondra.  Hearing/Vision screen No results found.  Dietary issues and exercise activities discussed:  Current Exercise Habits: Home exercise routine, Type of exercise: Other - see comments (PT STATED THAT SHE SITS IN A CHAIR AND WORK OUT HER LEGS), Time (Minutes): 10, Frequency (Times/Week): 7, Weekly Exercise  (Minutes/Week): 70, Intensity: Mild, Exercise limited by: None identified   Goals Addressed   None   Depression Screen    12/07/2021    2:05 PM 08/25/2021    4:46 PM 08/25/2021    3:43 PM 04/02/2021    1:55 PM 01/12/2021    1:21 PM 12/12/2020   11:01 AM 04/24/2020    4:10 PM  PHQ 2/9 Scores  PHQ - 2 Score 0 0 0 0 0 0 0  PHQ- 9 Score     0 0 7    Fall Risk    12/07/2021    2:08 PM 12/07/2021    1:15 PM 08/25/2021    3:41 PM 04/02/2021   10:09 AM 02/19/2021    4:15 PM  Fall Risk   Falls in the past year? 0 1 0 1 1  Number falls in past yr: 0 1 0 0 0  Injury with Fall? 0 0 0 1 1  Risk for fall due to : No Fall Risks   History of fall(s);Impaired balance/gait History of fall(s)  Follow up Falls evaluation completed;Falls prevention discussed Falls evaluation completed Falls evaluation completed Falls evaluation completed Falls evaluation completed;Falls prevention discussed    FALL RISK PREVENTION PERTAINING TO THE HOME:  Any stairs in or around the home? Yes  If so, are there any without handrails? No  Home free of loose throw rugs in walkways, pet beds, electrical cords, etc? Yes  Adequate lighting in your home to reduce risk of falls? Yes   ASSISTIVE DEVICES UTILIZED TO PREVENT FALLS:  Life alert? Yes  Use of a cane, walker or w/c? No  Grab bars in the bathroom? Yes  Shower chair or bench in shower? No  Elevated toilet seat or a handicapped toilet? No   TIMED UP AND GO:  Was the test performed? No .  Length of time to ambulate 10 feet: N/A sec.     Cognitive Function:        12/07/2021    2:10 PM  6CIT Screen  What Year? 0 points  What month? 0 points  What time? 0 points  Count back from 20 0 points  Months in reverse 0 points  Repeat phrase 0 points  Total Score 0 points    Immunizations Immunization History  Administered Date(s) Administered   Influenza Split 05/25/2011   Influenza,inj,Quad PF,6+ Mos 03/27/2013, 04/11/2014, 03/14/2016, 03/28/2020    Influenza-Unspecified 04/09/2015, 03/24/2017   PFIZER(Purple Top)SARS-COV-2 Vaccination 07/16/2019, 08/06/2019, 04/10/2020, 12/12/2020   Pfizer Covid-19 Vaccine Bivalent Booster 11yr & up 04/04/2021   Pneumococcal-Unspecified 03/24/2016   Tdap 03/25/2016    TDAP status: Up to date  Flu Vaccine status: Up to date  Pneumococcal vaccine status: Due, Education has been provided regarding the importance of this vaccine. Advised may receive this vaccine at local pharmacy or Health Dept. Aware to provide a copy of the vaccination record if obtained from local pharmacy or Health Dept. Verbalized acceptance and understanding.  Covid-19 vaccine status: Completed vaccines  Qualifies for Shingles Vaccine? Yes   Zostavax completed No   Shingrix Completed?: No.    Education has been provided regarding the importance of this vaccine. Patient has been advised to call insurance company to determine out of pocket expense if they have not yet received this vaccine. Advised  may also receive vaccine at local pharmacy or Health Dept. Verbalized acceptance and understanding.  Screening Tests Health Maintenance  Topic Date Due   Zoster Vaccines- Shingrix (1 of 2) Never done   Pneumonia Vaccine 61+ Years old (1 - PCV) 01/22/1997   COVID-19 Vaccine (6 - Pfizer risk series) 05/30/2021   INFLUENZA VACCINE  12/22/2021   TETANUS/TDAP  03/25/2026   DEXA SCAN  Completed   HPV VACCINES  Aged Out    Health Maintenance  Health Maintenance Due  Topic Date Due   Zoster Vaccines- Shingrix (1 of 2) Never done   Pneumonia Vaccine 40+ Years old (1 - PCV) 01/22/1997   COVID-19 Vaccine (6 - Pfizer risk series) 05/30/2021   INFLUENZA VACCINE  12/22/2021    Colorectal cancer screening: No longer required.   Mammogram status: No longer required due to DEFERRED TO PCP .    Lung Cancer Screening: (Low Dose CT Chest recommended if Age 26-80 years, 30 pack-year currently smoking OR have quit w/in 15years.) does not  qualify.   Lung Cancer Screening Referral: DEFERRED TO PCP   Additional Screening:  Hepatitis C Screening: does not qualify; Completed DEFERRED TO PCP   Vision Screening: Recommended annual ophthalmology exams for early detection of glaucoma and other disorders of the eye. Is the patient up to date with their annual eye exam?  Yes  Who is the provider or what is the name of the office in which the patient attends annual eye exams? [facility]  If pt is not established with a provider, would they like to be referred to a provider to establish care? No .   Dental Screening: Recommended annual dental exams for proper oral hygiene  Community Resource Referral / Chronic Care Management: CRR required this visit?  No   CCM required this visit?  No      Plan:     I have personally reviewed and noted the following in the patient's chart:   Medical and social history Use of alcohol, tobacco or illicit drugs  Current medications and supplements including opioid prescriptions. Patient is not currently taking opioid prescriptions. Functional ability and status Nutritional status Physical activity Advanced directives List of other physicians Hospitalizations, surgeries, and ER visits in previous 12 months Vitals Screenings to include cognitive, depression, and falls Referrals and appointments  In addition, I have reviewed and discussed with patient certain preventive protocols, quality metrics, and best practice recommendations. A written personalized care plan for preventive services as well as general preventive health recommendations were provided to patient.     Judyann Munson, CMA   02/03/2022   Nurse Notes: IN PERSON   Ms. Roberson , Thank you for taking time to come for your Medicare Wellness Visit. I appreciate your ongoing commitment to your health goals. Please review the following plan we discussed and let me know if I can assist you in the future.   These are the goals  we discussed:  Goals   None     This is a list of the screening recommended for you and due dates:  Health Maintenance  Topic Date Due   Zoster (Shingles) Vaccine (1 of 2) Never done   Pneumonia Vaccine (1 - PCV) 01/22/1997   COVID-19 Vaccine (6 - Pfizer risk series) 05/30/2021   Flu Shot  12/22/2021   Tetanus Vaccine  03/25/2026   DEXA scan (bone density measurement)  Completed   HPV Vaccine  Aged Out

## 2021-12-07 NOTE — Assessment & Plan Note (Signed)
Patient recently seen by audiology and found to have stable moderate/severe bilateral snhl with a hearing handicap of 66(significant handicap). Patient is having sever tinnitus that is having an impact on her quality of life. Asked patient about depression and she expresses some depressed mood, but is otherwise doing things she enjoys, eating better, sleeping well and able to complete tasks. Do not have concern for clinical depression at this time and patient has good support as her son lives close by and stops by multiple times daily. Referral placed to [facility] with Tillman Abide Ph.D :Joylene Igo [phone], email billing'[email or web address] .[email or web address]

## 2021-12-07 NOTE — Progress Notes (Signed)
Established Patient Office Visit  Subjective   Patient ID: [name] [name], female    DOB: [birth year]  Age: 88 y.o. MRN: [id number]  Chief Complaint  Patient presents with   Otalgia    [name] [name] is a 88 y/o female with a pmh outlined below who presents for follow up of tinnitus. Please see A/P for HPI.  Otalgia  Associated symptoms include hearing loss. Pertinent negatives include no coughing.      Review of Systems  HENT:  Positive for congestion, hearing loss and tinnitus. Negative for ear pain.   Respiratory:  Positive for shortness of breath. Negative for cough.   Cardiovascular:  Positive for leg swelling. Negative for chest pain, palpitations, orthopnea and PND.  Psychiatric/Behavioral:  Positive for depression.       Objective:     BP (!) 175/74 (BP Location: Left Arm, Cuff Size: Normal)   Pulse 75   Temp 98.1 F (36.7 C) (Oral)   Ht 5' (1.524 m)   Wt 140 lb 11.2 oz (63.8 kg)   LMP 05/24/1970   SpO2 100%   BMI 27.48 kg/m    Physical Exam Constitutional:      Appearance: Normal appearance.  Cardiovascular:     Rate and Rhythm: Normal rate and regular rhythm.     Pulses: Normal pulses.     Heart sounds: Normal heart sounds. No murmur heard. Pulmonary:     Effort: Pulmonary effort is normal. No respiratory distress.     Breath sounds: Normal breath sounds. No wheezing or rales.  Abdominal:     General: Bowel sounds are normal.  Musculoskeletal:     Right lower leg: Edema present.     Left lower leg: Edema present.  Skin:    Capillary Refill: Capillary refill takes more than 3 seconds.  Neurological:     Mental Status: She is alert.     Gait: Gait normal.      No results found for any visits on 12/07/21.    The ASCVD Risk score (Arnett DK, et al., 2019) failed to calculate for the following reasons:   The 2019 ASCVD risk score is only valid for ages 24 to 84    Assessment & Plan:   Problem List Items Addressed This Visit        Cardiovascular and Mediastinum   Essential hypertension (Chronic)    Patient found to have BP of 160/72 in clinic today, with repeat systolics to 379. This is consistent with age related isolated systolic hypertension. She says that her BP spikes at time but always normalizes when she measures it at home. In the past she has been told to discontinue BP meds due to hypotension. Will not add another medication to the regimen at this time. -Continue metoprolol 25 mg daily      Venous insufficiency of both lower extremities    Patient continues to have lower extremity swelling that worsens throughout the day and improves with elevation and overnight. She has a history of varicose veins treated surgically. These are consistent with venous incompetence and ROS for HF was negative. -continue compression stockings -elevate legs when resting        Nervous and Auditory   Presbycusis of both ears    Patient recently seen by audiology and found to have stable moderate/severe bilateral snhl with a hearing handicap of 66(significant handicap). Patient is having sever tinnitus that is having an impact on her quality of life. Asked patient about depression and she expresses  some depressed mood, but is otherwise doing things she enjoys, eating better, sleeping well and able to complete tasks. Do not have concern for clinical depression at this time and patient has good support as her son lives close by and stops by multiple times daily. Referral placed to [facility] with Tillman Abide Ph.D :Joylene Igo [phone], email [email or web address]        Musculoskeletal and Integument   Osteoporosis    Patient apparently has a history of osteoporosis as well as vitamin D deficiency. She had a DEXA 06/2011 with a T score of -2.7. She completed a course of bisphosphates at that time. Will repeat DEXA at this time. Patient has esopageal spasms and is not a candidate for alendronate but would be a candidate  for yearly zoledronic acid infusion. -Repeat DEXA, follow up 2-3 months      Relevant Orders   DG Bone Density   Other Visit Diagnoses     Tinnitus of right ear    -  Primary   Relevant Orders   Ambulatory referral to Audiology       No follow-ups on file.    Iona Coach, MD

## 2021-12-07 NOTE — Patient Instructions (Addendum)
Thank you, Ms.Odella Aquas Gumbs for allowing [place] to provide your care today. Today we discussed :  [place]- We have referred you to the [facility]innitus [facility]  Osteoporosis- Your last bone scan 10 years ago showed low bone density. We would like to repeat this and see how the density has changed. If it is still low we can prescribe a once per year infusion to help strengthen your bones.   Referrals ordered today:    Referral Orders         Ambulatory referral to Audiology      I have ordered the following medication/changed the following medications:   Stop the following medications: There are no discontinued medications.   Start the following medications: No orders of the defined types were placed in this encounter.    Follow up: 2-3 months     We look forward to seeing you next time. Please call our clinic at [phone] if you have any questions or concerns. The best time to call is Monday-Friday from 9am-4pm, but there is someone available 24/7. If after hours or the weekend, call the main hospital number and ask for the Internal Medicine Resident On-Call. If you need medication refills, please notify your pharmacy one week in advance and they will send [place] a request.   Thank you for trusting me with your care. Wishing you the best!   [place]

## 2021-12-07 NOTE — Patient Instructions (Signed)

## 2021-12-07 NOTE — Assessment & Plan Note (Addendum)
Patient apparently has a history of osteoporosis as well as vitamin D deficiency. She had a DEXA 06/2011 with a T score of -2.7. She completed a course of bisphosphates at that time. Will repeat DEXA at this time. Patient has esopageal spasms and is not a candidate for alendronate but would be a candidate for yearly zoledronic acid infusion. Most recent calcium  In January 2023 was 9.5. Has a history of vit D deficiency but most recent levels in 2013 were normal at 33. -Repeat DEXA, follow up 2-3 months -Check vitamin D levels at next visit

## 2021-12-07 NOTE — Progress Notes (Deleted)
Tinnitus- refer to [facility] tinittus clinic. Avoid caffeine and alcohol. Mental health screening.  HTN -metoprolol 25 mg daily  Moderate/severe bilateral sensorineural hearing loss-  Hearing handicap score of 66(43-100 significant handicap).Biaural amplification recommended, currently only using right.  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

## 2021-12-09 ENCOUNTER — Other Ambulatory Visit: Payer: Self-pay | Admitting: Internal Medicine

## 2021-12-09 DIAGNOSIS — F419 Anxiety disorder, unspecified: Secondary | ICD-10-CM

## 2021-12-09 NOTE — Progress Notes (Signed)
Internal Medicine Clinic Attending  I saw and evaluated the patient.  I personally confirmed the key portions of the history and exam documented by the resident  and I reviewed pertinent patient test results.  The assessment, diagnosis, and plan were formulated together and I agree with the documentation in the resident's note.  

## 2021-12-15 DIAGNOSIS — H9313 Tinnitus, bilateral: Secondary | ICD-10-CM | POA: Diagnosis not present

## 2021-12-15 DIAGNOSIS — H903 Sensorineural hearing loss, bilateral: Secondary | ICD-10-CM | POA: Diagnosis not present

## 2022-03-18 DIAGNOSIS — Z23 Encounter for immunization: Secondary | ICD-10-CM | POA: Diagnosis not present

## 2022-04-08 DIAGNOSIS — H02052 Trichiasis without entropian right lower eyelid: Secondary | ICD-10-CM | POA: Diagnosis not present

## 2022-04-08 DIAGNOSIS — H2513 Age-related nuclear cataract, bilateral: Secondary | ICD-10-CM | POA: Diagnosis not present

## 2022-04-08 DIAGNOSIS — H02055 Trichiasis without entropian left lower eyelid: Secondary | ICD-10-CM | POA: Diagnosis not present

## 2022-04-08 DIAGNOSIS — H04123 Dry eye syndrome of bilateral lacrimal glands: Secondary | ICD-10-CM | POA: Diagnosis not present

## 2022-04-08 DIAGNOSIS — H1013 Acute atopic conjunctivitis, bilateral: Secondary | ICD-10-CM | POA: Diagnosis not present

## 2022-04-08 DIAGNOSIS — H5703 Miosis: Secondary | ICD-10-CM | POA: Diagnosis not present

## 2022-04-08 DIAGNOSIS — H0102A Squamous blepharitis right eye, upper and lower eyelids: Secondary | ICD-10-CM | POA: Diagnosis not present

## 2022-04-08 DIAGNOSIS — H0102B Squamous blepharitis left eye, upper and lower eyelids: Secondary | ICD-10-CM | POA: Diagnosis not present

## 2022-06-02 ENCOUNTER — Ambulatory Visit (INDEPENDENT_AMBULATORY_CARE_PROVIDER_SITE_OTHER): Payer: Medicare Other | Admitting: Internal Medicine

## 2022-06-02 ENCOUNTER — Telehealth: Payer: Self-pay

## 2022-06-02 ENCOUNTER — Encounter: Payer: Self-pay | Admitting: Internal Medicine

## 2022-06-02 VITALS — BP 179/77 | HR 85 | Temp 97.8°F | Wt 147.9 lb

## 2022-06-02 DIAGNOSIS — M1990 Unspecified osteoarthritis, unspecified site: Secondary | ICD-10-CM | POA: Diagnosis not present

## 2022-06-02 DIAGNOSIS — I10 Essential (primary) hypertension: Secondary | ICD-10-CM | POA: Diagnosis not present

## 2022-06-02 DIAGNOSIS — Z87891 Personal history of nicotine dependence: Secondary | ICD-10-CM | POA: Diagnosis not present

## 2022-06-02 DIAGNOSIS — I872 Venous insufficiency (chronic) (peripheral): Secondary | ICD-10-CM | POA: Diagnosis not present

## 2022-06-02 DIAGNOSIS — L989 Disorder of the skin and subcutaneous tissue, unspecified: Secondary | ICD-10-CM

## 2022-06-02 MED ORDER — AMLODIPINE BESYLATE 5 MG PO TABS: 5.0000 mg | ORAL_TABLET | Freq: Every day | ORAL | 11 refills | Status: DC

## 2022-06-02 MED ORDER — ACETAMINOPHEN 160 MG/5ML PO LIQD: 500.0000 mg | Freq: Four times a day (QID) | ORAL | 0 refills | Status: DC | PRN

## 2022-06-02 NOTE — Patient Instructions (Signed)
Ms.Resa C Court, it was a pleasure seeing you today! You endorsed feeling well today. Below are some of the things we talked about this visit. We look forward to seeing you in the follow up appointment!  Today we discussed: For your leg swelling, use compression stocking. Get the graded compression stockings over the counter. For the pain, I am sending liquid tylenol. Take 15 mililiters every 6 hours.  For your high blood pressure we will add a new medicine. Please take this every day. Check your BP and if lower than 110/60 call our clinic.  We will bring you in next week for a biopsy of the skin lesion that you have.  I have ordered the following labs today:  Lab Orders  No laboratory test(s) ordered today      Referrals ordered today:   Referral Orders  No referral(s) requested today     I have ordered the following medication/changed the following medications:   Stop the following medications: Medications Discontinued During This Encounter  Medication Reason   LORazepam (ATIVAN) 0.5 MG tablet Patient has not taken in last 30 days   albuterol (VENTOLIN HFA) 108 (90 Base) MCG/ACT inhaler Patient has not taken in last 30 days   benzonatate (TESSALON) 100 MG capsule Patient has not taken in last 30 days   bismuth subsalicylate (PEPTO BISMOL) 262 MG/15ML suspension Patient has not taken in last 30 days   Guaifenesin 200 MG/5ML LIQD Patient has not taken in last 30 days     Start the following medications: Meds ordered this encounter  Medications   amLODipine (NORVASC) 5 MG tablet    Sig: Take 1 tablet (5 mg total) by mouth daily.    Dispense:  30 tablet    Refill:  11   acetaminophen (TYLENOL) 160 MG/5ML liquid    Sig: Take 15.6 mLs (500 mg total) by mouth every 6 (six) hours as needed for pain.    Dispense:  120 mL    Refill:  0     Follow-up: 1 week for biopsy  Please make sure to arrive 15 minutes prior to your next appointment. If you arrive late, you may be asked to  reschedule.   We look forward to seeing you next time. Please call our clinic at [phone] if you have any questions or concerns. The best time to call is Monday-Friday from 9am-4pm, but there is someone available 24/7. If after hours or the weekend, call the main hospital number and ask for the Internal Medicine Resident On-Call. If you need medication refills, please notify your pharmacy one week in advance and they will send [place] a request.  Thank you for letting us take part in your care. Wishing you the best!  Thank you, Idamae Schuller, MD

## 2022-06-02 NOTE — Progress Notes (Signed)
[place]  CC: right leg pain and ankle swelling.  HPI:  [name] is a 89 y.o. with medical history of HTN, HLD, venous insufficiency, osteoarthritis, osteoporosis, allergic rhinitis, hypercontractile contractile esophagus presenting to [facility] for a visit for right leg pain and ankle swelling.    Please see problem-based list for further details, assessments, and plans.  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   Skin cancer of arm 09/26/2014   Splenic artery aneurysm ([place]) 04/02/2021   Stricture esophagus    TMJ pain dysfunction syndrome 09/25/2018   Vitamin D deficiency    2010     Current Outpatient Medications (Cardiovascular):    amLODipine (NORVASC) 5 MG tablet, Take 1 tablet (5 mg total) by mouth daily.   flecainide (TAMBOCOR) 50 MG tablet, TAKE 2 TABLETS EVERY MORNING AND 1 TABLET AT NIGHT   metoprolol succinate (TOPROL XL) 25 MG 24 hr tablet, May take 1 tablet by mouth daily.   nitroGLYCERIN (NITROSTAT) 0.3 MG SL tablet, Place 1 tablet (0.3 mg total) under the tongue every 5 (five) minutes as needed (esophageal pain. Do not take any more than 3 doses in 24 hours.).  Current Outpatient Medications (Respiratory):    cetirizine (ZYRTEC ALLERGY) 10 MG tablet, Take 1 tablet (10 mg total) by  mouth daily.   fluticasone (FLONASE) 50 MCG/ACT nasal spray, Place 1 spray into both nostrils daily.   sodium chloride (OCEAN) 0.65 % SOLN nasal spray, Place 1 spray into both nostrils as needed for congestion.  Current Outpatient Medications (Analgesics):    acetaminophen (TYLENOL) 160 MG/5ML liquid, Take 15.6 mLs (500 mg total) by mouth every 6 (six) hours as needed for pain.   Current Outpatient Medications (Other):    AMBULATORY NON FORMULARY MEDICATION, Peppermint altoids 1-2 tabs as needed   busPIRone (BUSPAR) 5 MG tablet, TAKE 1 TABLET TWICE A DAY   hyoscyamine (LEVSIN) 0.125 MG/5ML ELIX, Take 5 mLs (0.125 mg total) by mouth every 6 (six) hours as needed.   nystatin cream (MYCOSTATIN), Apply 1 application topically 2 (two) times daily.  Review of Systems:  Review of system negative unless stated in the problem list or HPI.    Physical Exam:  Vitals:   06/02/22 1316  BP: (!) 179/77  Pulse: 85  Temp: 97.8 F (36.6 C)  TempSrc: Oral  SpO2: 99%  Weight: 147 lb 14.4 oz (67.1 kg)    Physical Exam General: NAD HENT: NCAT Lungs: CTAB, no wheeze, rhonchi or rales.  Cardiovascular: Normal heart sounds, no r/m/g, 2+ pulses in all extremities. No LE edema Abdomen: No TTP, normal bowel sounds MSK: No asymmetry or muscle atrophy.  Skin: tortuous veins present bilateral legs. Right forearm lesion present (see media tab) Neuro: Alert and oriented x4. CN grossly intact Psych: Normal mood and normal affect   Assessment & Plan:   Essential hypertension Pt has HTN and takes 12.5 mg toprol as needed. Her BP was elevated at 179/77 with HR 85. She states her BP has been very sensitive to BP meds. Advised pt to take toprol 25 mg qd as instructed previously and will start amlodipine 5 mg qd. Will follow up in one week.   Venous insufficiency of both lower extremities Pt had complaint of right leg welling and some pain in her right leg. She has hx of venous insufficiency and was  recommended compression stockings. She states compression stockings are too tight and hurt her legs and she has been unable to wear them. Advised pt to try graduated compression socks to avoid the discomfort but emphasized her need for compression socks. Pain is diffuse and not localized to posterior aspect of leg and same diameter of both legs make DVT less likely.   Skin lesion Patient skin lesion is concerning for melanoma. Will plan for excisional biopsy at next visit and send for pathology to rule out melanoma.    See Encounters Tab for problem based charting.  Patient discussed with Dr. Edrick Kins, MD Tillie Rung. [facility] Internal Medicine Residency, PGY-2

## 2022-06-02 NOTE — Telephone Encounter (Signed)
Return pt's call -stated her leg is swollen; hx of swelling for years per pt. Stated her right leg is pink in color, more swollen, and it hurts. Stated she had elevated her leg all day yesterday and night. Wears compression stockings sometimes. Requesting an appt. [place] office had already talked to her son and scheduled an appt today @ 1315 PM w/Dr Humphrey Rolls. Pt was informed.

## 2022-06-02 NOTE — Telephone Encounter (Signed)
Pt calling about her right leg. Pt states her right leg and the right ankle is swollen.  The patient Right ankle is swollen really large and is really concerned about it hurting and is turning pink.

## 2022-06-04 ENCOUNTER — Encounter: Payer: Self-pay | Admitting: Internal Medicine

## 2022-06-04 DIAGNOSIS — L989 Disorder of the skin and subcutaneous tissue, unspecified: Secondary | ICD-10-CM | POA: Insufficient documentation

## 2022-06-04 MED ORDER — METOPROLOL SUCCINATE ER 25 MG PO TB24: ORAL_TABLET | ORAL | 2 refills | Status: DC

## 2022-06-04 NOTE — Assessment & Plan Note (Signed)
Pt has HTN and takes 12.5 mg toprol as needed. Her BP was elevated at 179/77 with HR 85. She states her BP has been very sensitive to BP meds. Advised pt to take toprol 25 mg qd as instructed previously and will start amlodipine 5 mg qd. Will follow up in one week.

## 2022-06-04 NOTE — Assessment & Plan Note (Signed)
Pt had complaint of right leg welling and some pain in her right leg. She has hx of venous insufficiency and was recommended compression stockings. She states compression stockings are too tight and hurt her legs and she has been unable to wear them. Advised pt to try graduated compression socks to avoid the discomfort but emphasized her need for compression socks. Pain is diffuse and not localized to posterior aspect of leg and same diameter of both legs make DVT less likely.

## 2022-06-04 NOTE — Assessment & Plan Note (Signed)
Patient skin lesion is concerning for melanoma. Will plan for excisional biopsy at next visit and send for pathology to rule out melanoma.

## 2022-06-09 NOTE — Progress Notes (Signed)
Internal Medicine Clinic Attending  Case discussed with Dr. Khan  At the time of the visit.  We reviewed the resident's history and exam and pertinent patient test results.  I agree with the assessment, diagnosis, and plan of care documented in the resident's note.  

## 2022-06-10 ENCOUNTER — Ambulatory Visit (INDEPENDENT_AMBULATORY_CARE_PROVIDER_SITE_OTHER): Payer: Medicare Other | Admitting: Internal Medicine

## 2022-06-10 ENCOUNTER — Encounter: Payer: Self-pay | Admitting: Internal Medicine

## 2022-06-10 VITALS — BP 166/70 | HR 75 | Temp 98.3°F | Wt 147.2 lb

## 2022-06-10 DIAGNOSIS — I872 Venous insufficiency (chronic) (peripheral): Secondary | ICD-10-CM | POA: Diagnosis not present

## 2022-06-10 DIAGNOSIS — I10 Essential (primary) hypertension: Secondary | ICD-10-CM | POA: Diagnosis not present

## 2022-06-10 DIAGNOSIS — L989 Disorder of the skin and subcutaneous tissue, unspecified: Secondary | ICD-10-CM

## 2022-06-10 DIAGNOSIS — Z87891 Personal history of nicotine dependence: Secondary | ICD-10-CM

## 2022-06-10 DIAGNOSIS — C44721 Squamous cell carcinoma of skin of unspecified lower limb, including hip: Secondary | ICD-10-CM | POA: Diagnosis not present

## 2022-06-10 NOTE — Patient Instructions (Signed)
Dear [name] [name],  Thank you for trusting [place] with your care today.   We discussed your blood pressure, the skin findings on your arms, and the non-healing spots on your legs.   We are stopping the amlodipine.  The skin findings on your arm do not require any treatment unless they are bothering you.  The non-healing spots on your legs are suspicious for squamous cell carcinomas. We will refer you to the dermatologist for evaluation of these.   Please return in 3 months for a follow up.

## 2022-06-10 NOTE — Progress Notes (Signed)
   CC: skin biopsy  HPI:[name] is a 89 y.o. female who presents for evaluation of skin lesion. Please see individual problem based A/P for details.   Depression, PHQ-9: Based on the patients  [place] Visit from 01/12/2021 in [place]  PHQ-9 Total Score 0      score we have .  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   Skin cancer of arm 09/26/2014   Splenic artery aneurysm ([place]) 04/02/2021   Stricture esophagus    TMJ pain dysfunction syndrome 09/25/2018   Vitamin D deficiency    2010   Review of Systems:   See HPI  Physical Exam: Vitals:   06/10/22 1315 06/10/22 1356  BP: (!) 172/67 (!) 166/70  Pulse: 75 75  Temp: 98.3 F (36.8 C)   TempSrc: Oral   SpO2: 100%   Weight: 147 lb 3.2 oz (66.8 kg)     General: NAD HEENT: Conjunctiva nl , antiicteric sclerae, moist mucous membranes, no exudate or erythema Cardiovascular: Normal rate, regular rhythm.  No murmurs, rubs, or gallops Pulmonary : Equal breath sounds, No wheezes, rales, or rhonchi Abdominal: soft, nontender,  bowel sounds present Ext: No edema in lower extremities, no tenderness to palpation of lower extremities.  Skin: Small hyperkeratotic light brown patches with palpable  elevation bilateral arms. Poorly healing small ulcers on bilateral ankles  Assessment & Plan:   See Encounters Tab for problem based charting.  Patient seen with Dr. Evette Doffing

## 2022-06-10 NOTE — Assessment & Plan Note (Signed)
Patient was recently started on amlodipine after single elevated BP reading at last OV. BP was not rechecked. She has not started taking the amlodipine. Says that her blood pressure is sensitive to BP meds. Reports recently being tried on additional BP agent by cardiology PA but that she subsequently developed hypotension and had to be taken off it. She does not consistently take her metoprolol unless BP is elevated on readings.  She does check her blood pressure at home and reports readings are typically 120's. She checked her blood pressure using her son's electric cuff as well and got very similar readings to her own cuff.  Asymptomatic overall.  This is a 89 year old woman in relatively good health; however, she does not tolerate strict blood pressure control well. Blood pressure readings today show wide pulse pressure, and I am concerned that additional BP agents would only worsen this issue and puts her at risk of syncope/fall. Furthermore, given her advanced age, tight BP control does not seem appropriate given lack of long term benefits. Instead, I think ensuring that patient does not have any hypertensive episodes that would put her at risk for stroke or MI via moderate BP control would be our best course of action. Will continue her metoprolol for now and stop her amlodipine.

## 2022-06-10 NOTE — Assessment & Plan Note (Signed)
Patient has been elevating her legs at night. This has helped with the swelling and pain. She also uses her compression stockings, but is not wearing them today. Continue elevation and stocking.

## 2022-06-10 NOTE — Assessment & Plan Note (Signed)
Patient has skin lesions on both arm. The one on her right arm has been present since at least last year. The one on her left arm has been present for several years. They have not chagned in size. Does report she had basal cell removed from her left arm and had some squamous cell spot on her face that she was treated for by dermatologist some years ago. She does report some poorly healing tender wounds on her bilateral ankles.  Small hyperkeratotic light brown patches with palpable elevation. Poorly healing small ulcers on bilateral ankles  Lesions are not consistent with basal cell, squamous, or melanocyte. Most consistent with benign seborrheic keratosis on both arms. These do not require biopsy or treatment unless they become bothersome. Furthermore, given lack of subcutaneous fat/tissue, I worry that biopsy would only cause damage to underlying structures. The non-healing lesions on her ankles do seem more consistent with squamous cell cancer for which we will refer her to dermatology for further evaluation.

## 2022-06-11 NOTE — Progress Notes (Signed)
Internal Medicine Clinic Attending  I saw and evaluated the patient.  I personally confirmed the key portions of the history and exam documented by Dr. Elliot Gurney and I reviewed pertinent patient test results.  The assessment, diagnosis, and plan were formulated together and I agree with the documentation in the resident's note.   Lesion on the left arm noted at last visit is not consistent with melanoma or any nevus. It appears to be a non-inflammed SK, no treatment is needed. More concerning though is the non-healing ulcerations on her bilateral lower legs. They are nestled within her chronic stasis dermatitis, do not appear to be stasis ulcers. Rather they appear to be at risk for cutaneous SCC. For this reason we will refer her to dermatology to consider biopsy or cryo treatment. Healing will be difficult given her chronic stasis and advanced age. We talked about using compression stockings to treat the venous stasis.

## 2022-07-07 ENCOUNTER — Telehealth: Payer: Self-pay | Admitting: *Deleted

## 2022-07-07 NOTE — Telephone Encounter (Signed)
Call from patient states has rash on leg that was seen at her last  visit.  Patient has had rash before and would like to get a refill on a creme she used before.  Fluorouracil that was ordered in the past for her.  Sates is using now but is old.  States has seen an improvement in her skin.  Refuses to come in for a visit.  Said  creme worked before.  Would like creme sent to [facility].  Patient has a Dermatology appointment scheduled for 08/27/2022.

## 2022-07-10 ENCOUNTER — Other Ambulatory Visit: Payer: Self-pay

## 2022-07-10 ENCOUNTER — Inpatient Hospital Stay (HOSPITAL_COMMUNITY)
Admission: EM | Admit: 2022-07-10 | Discharge: 2022-07-12 | DRG: 866 | Disposition: A | Discharge status: Home / Self Care | Payer: Medicare Other | Attending: Internal Medicine | Admitting: Internal Medicine

## 2022-07-10 ENCOUNTER — Emergency Department (HOSPITAL_COMMUNITY): Payer: Medicare Other

## 2022-07-10 ENCOUNTER — Encounter (HOSPITAL_COMMUNITY): Payer: Self-pay | Admitting: *Deleted

## 2022-07-10 DIAGNOSIS — F419 Anxiety disorder, unspecified: Secondary | ICD-10-CM | POA: Diagnosis present

## 2022-07-10 DIAGNOSIS — Z85828 Personal history of other malignant neoplasm of skin: Secondary | ICD-10-CM

## 2022-07-10 DIAGNOSIS — R509 Fever, unspecified: Principal | ICD-10-CM | POA: Diagnosis present

## 2022-07-10 DIAGNOSIS — Z888 Allergy status to other drugs, medicaments and biological substances status: Secondary | ICD-10-CM

## 2022-07-10 DIAGNOSIS — R918 Other nonspecific abnormal finding of lung field: Secondary | ICD-10-CM | POA: Diagnosis not present

## 2022-07-10 DIAGNOSIS — Z79899 Other long term (current) drug therapy: Secondary | ICD-10-CM | POA: Diagnosis not present

## 2022-07-10 DIAGNOSIS — I10 Essential (primary) hypertension: Secondary | ICD-10-CM | POA: Diagnosis not present

## 2022-07-10 DIAGNOSIS — R1111 Vomiting without nausea: Secondary | ICD-10-CM | POA: Diagnosis not present

## 2022-07-10 DIAGNOSIS — Z8249 Family history of ischemic heart disease and other diseases of the circulatory system: Secondary | ICD-10-CM | POA: Diagnosis not present

## 2022-07-10 DIAGNOSIS — J180 Bronchopneumonia, unspecified organism: Secondary | ICD-10-CM | POA: Diagnosis not present

## 2022-07-10 DIAGNOSIS — K828 Other specified diseases of gallbladder: Secondary | ICD-10-CM | POA: Diagnosis present

## 2022-07-10 DIAGNOSIS — K219 Gastro-esophageal reflux disease without esophagitis: Secondary | ICD-10-CM | POA: Diagnosis present

## 2022-07-10 DIAGNOSIS — R110 Nausea: Secondary | ICD-10-CM | POA: Diagnosis not present

## 2022-07-10 DIAGNOSIS — Z841 Family history of disorders of kidney and ureter: Secondary | ICD-10-CM | POA: Diagnosis not present

## 2022-07-10 DIAGNOSIS — J90 Pleural effusion, not elsewhere classified: Secondary | ICD-10-CM | POA: Diagnosis not present

## 2022-07-10 DIAGNOSIS — K222 Esophageal obstruction: Secondary | ICD-10-CM | POA: Diagnosis present

## 2022-07-10 DIAGNOSIS — I700 Atherosclerosis of aorta: Secondary | ICD-10-CM | POA: Diagnosis not present

## 2022-07-10 DIAGNOSIS — R7401 Elevation of levels of liver transaminase levels: Secondary | ICD-10-CM | POA: Insufficient documentation

## 2022-07-10 DIAGNOSIS — R5081 Fever presenting with conditions classified elsewhere: Secondary | ICD-10-CM | POA: Diagnosis not present

## 2022-07-10 DIAGNOSIS — Z1152 Encounter for screening for COVID-19: Secondary | ICD-10-CM

## 2022-07-10 DIAGNOSIS — I491 Atrial premature depolarization: Secondary | ICD-10-CM | POA: Diagnosis present

## 2022-07-10 DIAGNOSIS — K838 Other specified diseases of biliary tract: Secondary | ICD-10-CM | POA: Diagnosis present

## 2022-07-10 DIAGNOSIS — B349 Viral infection, unspecified: Principal | ICD-10-CM | POA: Diagnosis present

## 2022-07-10 DIAGNOSIS — R112 Nausea with vomiting, unspecified: Secondary | ICD-10-CM | POA: Diagnosis not present

## 2022-07-10 DIAGNOSIS — Z823 Family history of stroke: Secondary | ICD-10-CM

## 2022-07-10 DIAGNOSIS — K5730 Diverticulosis of large intestine without perforation or abscess without bleeding: Secondary | ICD-10-CM | POA: Diagnosis not present

## 2022-07-10 DIAGNOSIS — J8410 Pulmonary fibrosis, unspecified: Secondary | ICD-10-CM | POA: Diagnosis not present

## 2022-07-10 DIAGNOSIS — K224 Dyskinesia of esophagus: Secondary | ICD-10-CM | POA: Diagnosis present

## 2022-07-10 DIAGNOSIS — N3289 Other specified disorders of bladder: Secondary | ICD-10-CM | POA: Diagnosis not present

## 2022-07-10 LAB — CBC WITH DIFFERENTIAL/PLATELET
Abs Immature Granulocytes: 0 10*3/uL (ref 0.00–0.07)
Basophils Absolute: 0 10*3/uL (ref 0.0–0.1)
Basophils Relative: 0 %
Eosinophils Absolute: 0 10*3/uL (ref 0.0–0.5)
Eosinophils Relative: 0 %
HCT: 36.9 % (ref 36.0–46.0)
Hemoglobin: 12.6 g/dL (ref 12.0–15.0)
Immature Granulocytes: 0 %
Lymphocytes Relative: 5 %
Lymphs Abs: 0.3 10*3/uL — ABNORMAL LOW (ref 0.7–4.0)
MCH: 32.8 pg (ref 26.0–34.0)
MCHC: 34.1 g/dL (ref 30.0–36.0)
MCV: 96.1 fL (ref 80.0–100.0)
Monocytes Absolute: 0.4 10*3/uL (ref 0.1–1.0)
Monocytes Relative: 9 %
Neutro Abs: 4.4 10*3/uL (ref 1.7–7.7)
Neutrophils Relative %: 86 %
Platelets: 223 10*3/uL (ref 150–400)
RBC: 3.84 MIL/uL — ABNORMAL LOW (ref 3.87–5.11)
RDW: 12.8 % (ref 11.5–15.5)
WBC: 5.1 10*3/uL (ref 4.0–10.5)
nRBC: 0 % (ref 0.0–0.2)

## 2022-07-10 LAB — COMPREHENSIVE METABOLIC PANEL
ALT: 814 U/L — ABNORMAL HIGH (ref 0–44)
AST: 1391 U/L — ABNORMAL HIGH (ref 15–41)
Albumin: 3.8 g/dL (ref 3.5–5.0)
Alkaline Phosphatase: 202 U/L — ABNORMAL HIGH (ref 38–126)
Anion gap: 12 (ref 5–15)
BUN: 17 mg/dL (ref 8–23)
CO2: 23 mmol/L (ref 22–32)
Calcium: 8.9 mg/dL (ref 8.9–10.3)
Chloride: 101 mmol/L (ref 98–111)
Creatinine, Ser: 0.79 mg/dL (ref 0.44–1.00)
GFR, Estimated: >60 mL/min (ref >60)
Glucose, Bld: 148 mg/dL — ABNORMAL HIGH (ref 70–99)
Potassium: 3.5 mmol/L (ref 3.5–5.1)
Sodium: 136 mmol/L (ref 135–145)
Total Bilirubin: 1.8 mg/dL — ABNORMAL HIGH (ref 0.3–1.2)
Total Protein: 6.8 g/dL (ref 6.5–8.1)

## 2022-07-10 LAB — RESP PANEL BY RT-PCR (RSV, FLU A&B, COVID)  RVPGX2
Influenza A by PCR: NEGATIVE
Influenza B by PCR: NEGATIVE
Resp Syncytial Virus by PCR: NEGATIVE
SARS Coronavirus 2 by RT PCR: NEGATIVE

## 2022-07-10 LAB — LACTIC ACID, PLASMA: Lactic Acid, Venous: 1.6 mmol/L (ref 0.5–1.9)

## 2022-07-10 LAB — LIPASE, BLOOD: Lipase: 33 U/L (ref 11–51)

## 2022-07-10 MED ORDER — SODIUM CHLORIDE 0.9 % IV BOLUS
1000.0000 mL | Freq: Once | INTRAVENOUS | Status: AC
  Administered 2022-07-10: 1000 mL via INTRAVENOUS

## 2022-07-10 MED ORDER — SODIUM CHLORIDE 0.9 % IV SOLN
1.0000 g | Freq: Once | INTRAVENOUS | Status: AC
  Administered 2022-07-10: 1 g via INTRAVENOUS
  Filled 2022-07-10: qty 10

## 2022-07-10 MED ORDER — SODIUM CHLORIDE 0.9 % IV BOLUS
500.0000 mL | Freq: Once | INTRAVENOUS | Status: AC
  Administered 2022-07-10: 500 mL via INTRAVENOUS

## 2022-07-10 MED ORDER — ACETAMINOPHEN 325 MG PO TABS
650.0000 mg | ORAL_TABLET | Freq: Once | ORAL | Status: AC
  Administered 2022-07-10: 650 mg via ORAL
  Filled 2022-07-10: qty 2

## 2022-07-10 MED ORDER — ONDANSETRON HCL 4 MG/2ML IJ SOLN
4.0000 mg | Freq: Once | INTRAMUSCULAR | Status: AC
  Administered 2022-07-10: 4 mg via INTRAVENOUS
  Filled 2022-07-10: qty 2

## 2022-07-10 MED ORDER — IOHEXOL 350 MG/ML SOLN
75.0000 mL | Freq: Once | INTRAVENOUS | Status: AC | PRN
  Administered 2022-07-10: 75 mL via INTRAVENOUS

## 2022-07-10 MED ORDER — METRONIDAZOLE 500 MG/100ML IV SOLN
500.0000 mg | Freq: Two times a day (BID) | INTRAVENOUS | Status: DC
  Administered 2022-07-10 – 2022-07-11 (×3): 500 mg via INTRAVENOUS
  Filled 2022-07-10 (×3): qty 100

## 2022-07-10 MED ORDER — SODIUM CHLORIDE 0.9 % IV SOLN
500.0000 mg | Freq: Once | INTRAVENOUS | Status: AC
  Administered 2022-07-10: 500 mg via INTRAVENOUS
  Filled 2022-07-10: qty 5

## 2022-07-10 NOTE — ED Provider Notes (Signed)
  Provider Note MRN:  [id number]  Arrival date & time: 07/11/22    ED Course and Medical Decision Making  Assumed care from Dr. Darl Householder at shift change.  Fever, parapneumonic effusion on x-ray, also with elevated LFTs.  Awaiting CT chest abdomen pelvis to further delineate infectious source.  Patient endorsing some mild abdominal pain at this time.  Has had some soft pressures over the past few hours upon my evaluation at 11:15 PM her blood pressures are normal and she seems well-perfused.  Will monitor closely.  Will need admission.  .Critical Care  Performed by: Maudie Flakes, MD Authorized by: Maudie Flakes, MD   Critical care provider statement:    Critical care time (minutes):  35   Critical care was necessary to treat or prevent imminent or life-threatening deterioration of the following conditions: Concern for sepsis, unclear source.   Critical care was time spent personally by me on the following activities:  Development of treatment plan with patient or surrogate, discussions with consultants, evaluation of patient's response to treatment, examination of patient, ordering and review of laboratory studies, ordering and review of radiographic studies, ordering and performing treatments and interventions, pulse oximetry, re-evaluation of patient's condition and review of old charts   CT abdomen pelvis without signs of obvious intra-abdominal infection.  Chronic unchanged dilation of common bile duct.  CT does not show parapneumonic effusion or pneumonia of any kind.  And so we are left with fever of unknown origin with some transient soft blood pressures.  Will admit to medicine for further care, patient may need ERCP or MRCP to follow-up on these LFTs.  Urinalysis is still pending.  Final Clinical Impressions(s) / ED Diagnoses     ICD-10-CM   1. Fever, unknown origin  R50.9       ED Discharge Orders     None       Discharge Instructions   None     Barth Kirks. [place]onia Small, [place] [email or web address]    Maudie Flakes, MD 07/11/22 Shelah Lewandowsky

## 2022-07-10 NOTE — ED Provider Notes (Signed)
[facility] Provider Note   CSN: [id number] Arrival date & time: 07/10/22  1942     History  Chief Complaint  Patient presents with   Generalized Body Aches        Fever    [name] [name] is a 89 y.o. female history of anemia, anxiety, hypertension, esophageal stricture here presenting with abdominal pain and vomiting and chills.  Patient is very functional at baseline and lives at home by herself.  Patient has some subjective chills earlier today.  She also has some abdominal pain and vomiting.  Patient denies any sick contacts but does have a nonproductive cough.  The history is provided by the patient.       Home Medications Prior to Admission medications   Medication Sig Start Date End Date Taking? Authorizing Provider  AMBULATORY NON FORMULARY MEDICATION Peppermint altoids 1-2 tabs as needed    [provider]  busPIRone (BUSPAR) 5 MG tablet TAKE 1 TABLET TWICE A DAY 12/09/21   Masters, Katie, DO  flecainide (TAMBOCOR) 50 MG tablet TAKE 2 TABLETS EVERY MORNING AND 1 TABLET AT NIGHT 09/22/21   Evans Lance, MD  metoprolol succinate (TOPROL XL) 25 MG 24 hr tablet May take 1 tablet by mouth daily. 06/04/22   Idamae Schuller, MD      Allergies    Other, Pantoprazole, Zyrtec [cetirizine], Ciprofloxacin, and Nifedipine    Review of Systems   Review of Systems  Constitutional:  Positive for fever.  Gastrointestinal:  Positive for abdominal pain and vomiting.  All other systems reviewed and are negative.   Physical Exam Updated Vital Signs BP (!) 124/53 (BP Location: Right Arm)   Pulse 92   Temp (!) 101.2 F (38.4 C) (Rectal)   Resp 17   Ht 5' (1.524 m)   Wt 66.8 kg   LMP 05/24/1970   SpO2 93%   BMI 28.76 kg/m  Physical Exam Vitals and nursing note reviewed.  Constitutional:      Comments: Chronically ill and slightly confused  HENT:     Head: Normocephalic.     Nose: Nose normal.     Mouth/Throat:      Mouth: Mucous membranes are dry.  Eyes:     Extraocular Movements: Extraocular movements intact.     Pupils: Pupils are equal, round, and reactive to light.  Cardiovascular:     Rate and Rhythm: Normal rate and regular rhythm.     Pulses: Normal pulses.     Heart sounds: Normal heart sounds.  Pulmonary:     Effort: Pulmonary effort is normal.     Breath sounds: Normal breath sounds.  Abdominal:     General: Abdomen is flat.     Comments: + Epigastric tenderness  Musculoskeletal:        General: Normal range of motion.     Cervical back: Normal range of motion and neck supple.  Skin:    General: Skin is warm.     Capillary Refill: Capillary refill takes less than 2 seconds.  Neurological:     Comments: Slightly confused.  Moving all extremities.  Normal strength bilateral arms and legs.  Psychiatric:        Mood and Affect: Mood normal.        Behavior: Behavior normal.     ED Results / Procedures / Treatments   Labs (all labs ordered are listed, but only abnormal results are displayed) Labs Reviewed  CBC WITH DIFFERENTIAL/PLATELET - Abnormal;  Notable for the following components:      Result Value   RBC 3.84 (*)    Lymphs Abs 0.3 (*)    All other components within normal limits  COMPREHENSIVE METABOLIC PANEL - Abnormal; Notable for the following components:   Glucose, Bld 148 (*)    AST 1,391 (*)    ALT 814 (*)    Alkaline Phosphatase 202 (*)    Total Bilirubin 1.8 (*)    All other components within normal limits  RESP PANEL BY RT-PCR (RSV, FLU A&B, COVID)  [id number]  CULTURE, BLOOD (ROUTINE X 2)  CULTURE, BLOOD (ROUTINE X 2)  LACTIC ACID, PLASMA  LIPASE, BLOOD  LACTIC ACID, PLASMA  URINALYSIS, W/ REFLEX TO CULTURE (INFECTION SUSPECTED)    EKG EKG Interpretation  Date/Time:  Saturday July 10 2022 20:04:48 EST Ventricular Rate:  92 PR Interval:  227 QRS Duration: 99 QT Interval:  360 QTC Calculation: 446 R Axis:   91 Text Interpretation: Sinus rhythm  Prolonged PR interval Right axis deviation Low voltage, precordial leads Anteroseptal infarct, age indeterminate Borderline repolarization abnormality No significant change since last tracing Confirmed by Wandra Arthurs [id number]) on 07/10/2022 8:18:20 PM  Radiology DG Chest Port 1 View  Result Date: 07/10/2022 CLINICAL DATA:  Fever EXAM: PORTABLE CHEST 1 VIEW COMPARISON:  09/14/2021 FINDINGS: Single frontal view of the chest demonstrates an unremarkable cardiac silhouette. There is chronic background scarring, greatest at the lung bases. Superimposed patchy consolidation at the left lung base as well as trace left pleural effusion consistent with bronchopneumonia. No pneumothorax. No acute bony abnormalities. IMPRESSION: 1. Patchy left basilar bronchopneumonia small left parapneumonic effusion. Electronically Signed   By: Randa Ngo M.D.   On: 07/10/2022 20:32    Procedures Procedures     CRITICAL CARE Performed by: Wandra Arthurs   Total critical care time: 36 minutes  Critical care time was exclusive of separately billable procedures and treating other patients.  Critical care was necessary to treat or prevent imminent or life-threatening deterioration.  Critical care was time spent personally by me on the following activities: development of treatment plan with patient and/or surrogate as well as nursing, discussions with consultants, evaluation of patient's response to treatment, examination of patient, obtaining history from patient or surrogate, ordering and performing treatments and interventions, ordering and review of laboratory studies, ordering and review of radiographic studies, pulse oximetry and re-evaluation of patient's condition.  Medications Ordered in ED Medications  azithromycin (ZITHROMAX) 500 mg in sodium chloride 0.9 % 250 mL IVPB (500 mg Intravenous New Bag/Given 07/10/22 2135)  sodium chloride 0.9 % bolus 500 mL (0 mLs Intravenous Stopped 07/10/22 2155)  ondansetron  (ZOFRAN) injection 4 mg (4 mg Intravenous Given 07/10/22 2048)  acetaminophen (TYLENOL) tablet 650 mg (650 mg Oral Given 07/10/22 2102)  cefTRIAXone (ROCEPHIN) 1 g in sodium chloride 0.9 % 100 mL IVPB (0 g Intravenous Stopped 07/10/22 2132)    ED Course/ Medical Decision Making/ A&P                             Medical Decision Making Avamae Hauskins Lancour is a 89 y.o. female here presenting with confusion and fever and abdominal pain.  Differential is broad.  Consider pneumonia versus pancreatitis versus gastroenteritis versus viral syndrome.  Plan to get CBC and CMP and chest x-ray and COVID test and UA and CT abdomen pelvis.  11:15 PM Patient's LFTs are elevated with AST of 1300  and ALT is 800.  Chest x-ray showed pneumonia.  Patient is now hypotensive.  Patient is receiving another 1 L bolus.  Also received Rocephin and azithromycin.  Signed out pending CT chest abdomen pelvis and admission.  Patient likely need to be admitted to stepdown.  Signed out to Dr. Sedonia Small in the ED to follow up CT and admit patient.   Amount and/or Complexity of Data Reviewed Labs: ordered. Radiology: ordered.  Risk OTC drugs. Prescription drug management.    Final Clinical Impression(s) / ED Diagnoses Final diagnoses:  None    Rx / DC Orders ED Discharge Orders     None         Drenda Freeze, MD 07/10/22 2316

## 2022-07-10 NOTE — ED Triage Notes (Signed)
Patient arrives via EMS with reported fever and body aches for the past week.  Patient is also reporting nausea.  Patient son is at bedside.  Cbg 185, vs 138/62, 94% roomair, pulse 99.

## 2022-07-10 NOTE — ED Notes (Signed)
MD Darl Householder at bedside, aware of temp 101.2

## 2022-07-11 ENCOUNTER — Encounter (HOSPITAL_COMMUNITY): Payer: Self-pay | Admitting: Internal Medicine

## 2022-07-11 ENCOUNTER — Other Ambulatory Visit: Payer: Self-pay

## 2022-07-11 DIAGNOSIS — K224 Dyskinesia of esophagus: Secondary | ICD-10-CM | POA: Diagnosis present

## 2022-07-11 DIAGNOSIS — R5081 Fever presenting with conditions classified elsewhere: Secondary | ICD-10-CM | POA: Diagnosis not present

## 2022-07-11 DIAGNOSIS — B349 Viral infection, unspecified: Secondary | ICD-10-CM | POA: Diagnosis present

## 2022-07-11 DIAGNOSIS — I491 Atrial premature depolarization: Secondary | ICD-10-CM | POA: Diagnosis present

## 2022-07-11 DIAGNOSIS — Z79899 Other long term (current) drug therapy: Secondary | ICD-10-CM | POA: Diagnosis not present

## 2022-07-11 DIAGNOSIS — R112 Nausea with vomiting, unspecified: Secondary | ICD-10-CM | POA: Diagnosis present

## 2022-07-11 DIAGNOSIS — Z841 Family history of disorders of kidney and ureter: Secondary | ICD-10-CM | POA: Diagnosis not present

## 2022-07-11 DIAGNOSIS — Z823 Family history of stroke: Secondary | ICD-10-CM | POA: Diagnosis not present

## 2022-07-11 DIAGNOSIS — K222 Esophageal obstruction: Secondary | ICD-10-CM | POA: Diagnosis present

## 2022-07-11 DIAGNOSIS — R509 Fever, unspecified: Secondary | ICD-10-CM | POA: Diagnosis present

## 2022-07-11 DIAGNOSIS — I10 Essential (primary) hypertension: Secondary | ICD-10-CM | POA: Diagnosis present

## 2022-07-11 DIAGNOSIS — Z1152 Encounter for screening for COVID-19: Secondary | ICD-10-CM | POA: Diagnosis not present

## 2022-07-11 DIAGNOSIS — F419 Anxiety disorder, unspecified: Secondary | ICD-10-CM | POA: Diagnosis present

## 2022-07-11 DIAGNOSIS — Z8249 Family history of ischemic heart disease and other diseases of the circulatory system: Secondary | ICD-10-CM | POA: Diagnosis not present

## 2022-07-11 DIAGNOSIS — K838 Other specified diseases of biliary tract: Secondary | ICD-10-CM | POA: Diagnosis present

## 2022-07-11 DIAGNOSIS — Z85828 Personal history of other malignant neoplasm of skin: Secondary | ICD-10-CM | POA: Diagnosis not present

## 2022-07-11 DIAGNOSIS — K828 Other specified diseases of gallbladder: Secondary | ICD-10-CM | POA: Diagnosis present

## 2022-07-11 DIAGNOSIS — K219 Gastro-esophageal reflux disease without esophagitis: Secondary | ICD-10-CM | POA: Diagnosis present

## 2022-07-11 DIAGNOSIS — R7401 Elevation of levels of liver transaminase levels: Secondary | ICD-10-CM | POA: Insufficient documentation

## 2022-07-11 DIAGNOSIS — Z888 Allergy status to other drugs, medicaments and biological substances status: Secondary | ICD-10-CM | POA: Diagnosis not present

## 2022-07-11 LAB — CBC
HCT: 36.2 % (ref 36.0–46.0)
Hemoglobin: 11.7 g/dL — ABNORMAL LOW (ref 12.0–15.0)
MCH: 32 pg (ref 26.0–34.0)
MCHC: 32.3 g/dL (ref 30.0–36.0)
MCV: 98.9 fL (ref 80.0–100.0)
Platelets: 201 10*3/uL (ref 150–400)
RBC: 3.66 MIL/uL — ABNORMAL LOW (ref 3.87–5.11)
RDW: 13 % (ref 11.5–15.5)
WBC: 4.8 10*3/uL (ref 4.0–10.5)
nRBC: 0 % (ref 0.0–0.2)

## 2022-07-11 LAB — RESPIRATORY PANEL BY PCR

## 2022-07-11 LAB — PROTIME-INR
INR: 1.3 — ABNORMAL HIGH (ref 0.8–1.2)
Prothrombin Time: 16.5 seconds — ABNORMAL HIGH (ref 11.4–15.2)

## 2022-07-11 LAB — URINALYSIS, W/ REFLEX TO CULTURE (INFECTION SUSPECTED)
Bacteria, UA: NONE SEEN
Bilirubin Urine: NEGATIVE
Glucose, UA: NEGATIVE mg/dL
Ketones, ur: NEGATIVE mg/dL
Leukocytes,Ua: NEGATIVE
Nitrite: NEGATIVE
Protein, ur: NEGATIVE mg/dL
Specific Gravity, Urine: 1.01 (ref 1.005–1.030)
pH: 7.5 (ref 5.0–8.0)

## 2022-07-11 LAB — COMPREHENSIVE METABOLIC PANEL
ALT: 667 U/L — ABNORMAL HIGH (ref 0–44)
AST: 916 U/L — ABNORMAL HIGH (ref 15–41)
Albumin: 3.2 g/dL — ABNORMAL LOW (ref 3.5–5.0)
Alkaline Phosphatase: 156 U/L — ABNORMAL HIGH (ref 38–126)
Anion gap: 7 (ref 5–15)
BUN: 13 mg/dL (ref 8–23)
CO2: 23 mmol/L (ref 22–32)
Calcium: 8.1 mg/dL — ABNORMAL LOW (ref 8.9–10.3)
Chloride: 107 mmol/L (ref 98–111)
Creatinine, Ser: 0.87 mg/dL (ref 0.44–1.00)
GFR, Estimated: >60 mL/min (ref >60)
Glucose, Bld: 113 mg/dL — ABNORMAL HIGH (ref 70–99)
Potassium: 3.7 mmol/L (ref 3.5–5.1)
Sodium: 137 mmol/L (ref 135–145)
Total Bilirubin: 1.4 mg/dL — ABNORMAL HIGH (ref 0.3–1.2)
Total Protein: 6 g/dL — ABNORMAL LOW (ref 6.5–8.1)

## 2022-07-11 LAB — HEPATITIS PANEL, ACUTE
HCV Ab: NONREACTIVE
Hep A IgM: NONREACTIVE
Hep B C IgM: NONREACTIVE
Hepatitis B Surface Ag: NONREACTIVE

## 2022-07-11 LAB — GLUCOSE, CAPILLARY: Glucose-Capillary: 137 mg/dL — ABNORMAL HIGH (ref 70–99)

## 2022-07-11 LAB — ACETAMINOPHEN LEVEL: Acetaminophen (Tylenol), Serum: <10 ug/mL — ABNORMAL LOW (ref 10–30)

## 2022-07-11 LAB — APTT: aPTT: 27 seconds (ref 24–36)

## 2022-07-11 LAB — PROCALCITONIN: Procalcitonin: 1.17 ng/mL

## 2022-07-11 MED ORDER — SODIUM CHLORIDE 0.9 % IV SOLN: INTRAVENOUS | Status: AC

## 2022-07-11 MED ORDER — BUSPIRONE HCL 5 MG PO TABS
5.0000 mg | ORAL_TABLET | Freq: Every day | ORAL | Status: DC
  Administered 2022-07-11: 5 mg via ORAL
  Filled 2022-07-11: qty 1

## 2022-07-11 MED ORDER — SODIUM CHLORIDE 0.9 % IV SOLN
2.0000 g | INTRAVENOUS | Status: DC
  Administered 2022-07-11: 2 g via INTRAVENOUS
  Filled 2022-07-11: qty 20

## 2022-07-11 MED ORDER — IBUPROFEN 400 MG PO TABS
200.0000 mg | ORAL_TABLET | Freq: Once | ORAL | Status: AC
  Administered 2022-07-11: 200 mg via ORAL
  Filled 2022-07-11: qty 1

## 2022-07-11 MED ORDER — FLECAINIDE ACETATE 100 MG PO TABS
100.0000 mg | ORAL_TABLET | Freq: Every day | ORAL | Status: DC
  Administered 2022-07-11 – 2022-07-12 (×2): 100 mg via ORAL
  Filled 2022-07-11 (×2): qty 1

## 2022-07-11 MED ORDER — ORAL CARE MOUTH RINSE: 15.0000 mL | OROMUCOSAL | Status: DC | PRN

## 2022-07-11 MED ORDER — ENOXAPARIN SODIUM 40 MG/0.4ML IJ SOSY
40.0000 mg | PREFILLED_SYRINGE | INTRAMUSCULAR | Status: DC
  Administered 2022-07-11 – 2022-07-12 (×2): 40 mg via SUBCUTANEOUS
  Filled 2022-07-11 (×2): qty 0.4

## 2022-07-11 MED ORDER — FLECAINIDE ACETATE 50 MG PO TABS
50.0000 mg | ORAL_TABLET | Freq: Every day | ORAL | Status: DC
  Administered 2022-07-11: 50 mg via ORAL
  Filled 2022-07-11 (×2): qty 1

## 2022-07-11 NOTE — Plan of Care (Signed)

## 2022-07-11 NOTE — ED Notes (Signed)
ED TO INPATIENT HANDOFF REPORT  ED Nurse Name and Phone #: Caryl Pina RN [phone]   S Name/Age/Gender [name] [name] 89 y.o. female Room/Bed: 030C/030C  Code Status   Code Status: Full Code  Home/SNF/Other Home Patient oriented to: self, place, time, and situation Is this baseline? Yes   Triage Complete: Triage complete  Chief Complaint Fever [R50.9]  Triage Note Patient arrives via EMS with reported fever and body aches for the past week.  Patient is also reporting nausea.  Patient son is at bedside.  Cbg 185, vs 138/62, 94% roomair, pulse 99.   Allergies Allergies  Allergen Reactions   Other Anaphylaxis    Reaction to GI Cocktail   Pantoprazole Other (See Comments)    Upset stomach and weight loss   Zyrtec [Cetirizine] Palpitations   Ciprofloxacin     Rash with burning all over body   Nifedipine     Caused low BP    Level of Care/Admitting Diagnosis ED Disposition     ED Disposition  Admit   Condition  --   Comment  Hospital Area: [facility] [[id number]]  Level of Care: Telemetry Medical [104]  May place patient in observation at [facility] or [facility] if equivalent level of care is available:: No  Covid Evaluation: Confirmed COVID Negative  Diagnosis: Fever [id number]  Admitting Physician: Sid Falcon [phone]  Attending Physician: Cresenciano Lick          B Medical/Surgery History Past Medical History:  Diagnosis Date   Anemia    iron def   Anxiety    Arrhythmia    paroxysmal SVT and symtomatic PAC's   ATRIAL PREMATURE BEATS 03/20/2009       Lovena Le, MD, Leonidas Romberg, Binnie Kand      Cerumen impaction 08/27/2021   Common bile duct dilation 04/02/2021   Diverticulosis    colon   Dysuria 03/07/2019   Elevated troponin    Gastritis and gastroduodenitis    GERD 04/02/2008   Qualifier: Diagnosis of  By: Jenny Reichmann MD, Hunt Oris    History of colonic polyps    cecal   Hypertension    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   Skin cancer of arm 09/26/2014   Splenic artery aneurysm ([place]) 04/02/2021   Stricture esophagus    TMJ pain dysfunction syndrome 09/25/2018   Vitamin D deficiency    2010   Past Surgical History:  Procedure Laterality Date   A&P, enterocele repair     s/p 2003   ABDOMINAL HYSTERECTOMY     BIOPSY  02/05/2020   Procedure: BIOPSY;  Surgeon: Thornton Park, MD;  [facility] ENDOSCOPY;  Service: Gastroenterology;;   BREAST REDUCTION SURGERY     s/p 1996      PATIENT DENIES BREAST REDUCTION    ESOPHAGEAL DILATION  02/05/2020   Procedure: ESOPHAGEAL DILATION;  Surgeon: Thornton Park, MD;  Location: [facility] ENDOSCOPY;  Service: Gastroenterology;;   ESOPHAGEAL MANOMETRY N/A 05/07/2020   Procedure: ESOPHAGEAL MANOMETRY (EM);  Surgeon: Milus Banister, MD;  Location: [facility] ENDOSCOPY;  Service: Endoscopy;  Laterality: N/A;   ESOPHAGOGASTRODUODENOSCOPY (EGD) WITH PROPOFOL N/A 02/05/2020   Procedure: ESOPHAGOGASTRODUODENOSCOPY (EGD) WITH PROPOFOL;  Surgeon: Thornton Park, MD;  Location: [facility];  Service: Gastroenterology;  Laterality: N/A;   HEMORRHOID  SURGERY     LEFT HEART CATH AND CORONARY ANGIOGRAPHY N/A 05/25/2017   Procedure: LEFT HEART CATH AND CORONARY ANGIOGRAPHY;  Surgeon: Jettie Booze, MD;  Location: [facility] CV LAB;  Service: Cardiovascular;  Laterality: N/A;     A IV Location/Drains/Wounds Patient Lines/Drains/Airways Status     Active Line/Drains/Airways     Name Placement date Placement time Site Days   Peripheral IV 07/10/22 20 G Anterior;Right Forearm 07/10/22  2033  Forearm  1   Peripheral IV 07/10/22 20 G Anterior;Left Forearm 07/10/22  2043  Forearm  1            Intake/Output Last 24 hours  Intake/Output Summary (Last 24 hours) at 07/11/2022 0143 Last data filed  at 07/11/2022 0008 Gross per 24 hour  Intake 1000 ml  Output --  Net 1000 ml    Labs/Imaging Results for orders placed or performed during the hospital encounter of 07/10/22 (from the past 48 hour(s))  CBC with Differential     Status: Abnormal   Collection Time: 07/10/22  8:33 PM  Result Value Ref Range   WBC 5.1 4.0 - 10.5 K/uL   RBC 3.84 (L) 3.87 - 5.11 MIL/uL   Hemoglobin 12.6 12.0 - 15.0 g/dL   HCT 36.9 36.0 - 46.0 %   MCV 96.1 80.0 - 100.0 fL   MCH 32.8 26.0 - 34.0 pg   MCHC 34.1 30.0 - 36.0 g/dL   RDW 12.8 11.5 - 15.5 %   Platelets 223 150 - 400 K/uL   nRBC 0.0 0.0 - 0.2 %   Neutrophils Relative % 86 %   Neutro Abs 4.4 1.7 - 7.7 K/uL   Lymphocytes Relative 5 %   Lymphs Abs 0.3 (L) 0.7 - 4.0 K/uL   Monocytes Relative 9 %   Monocytes Absolute 0.4 0.1 - 1.0 K/uL   Eosinophils Relative 0 %   Eosinophils Absolute 0.0 0.0 - 0.5 K/uL   Basophils Relative 0 %   Basophils Absolute 0.0 0.0 - 0.1 K/uL   Immature Granulocytes 0 %   Abs Immature Granulocytes 0.00 0.00 - 0.07 K/uL    Comment: Performed at [facility], [address] [place], [place] [address]  Comprehensive metabolic panel     Status: Abnormal   Collection Time: 07/10/22  8:33 PM  Result Value Ref Range   Sodium 136 135 - 145 mmol/L   Potassium 3.5 3.5 - 5.1 mmol/L   Chloride 101 98 - 111 mmol/L   CO2 23 22 - 32 mmol/L   Glucose, Bld 148 (H) 70 - 99 mg/dL    Comment: Glucose reference range applies only to samples taken after fasting for at least 8 hours.   BUN 17 8 - 23 mg/dL   Creatinine, Ser 0.79 0.44 - 1.00 mg/dL   Calcium 8.9 8.9 - 10.3 mg/dL   Total Protein 6.8 6.5 - 8.1 g/dL   Albumin 3.8 3.5 - 5.0 g/dL   AST 1,391 (H) 15 - 41 U/L   ALT 814 (H) 0 - 44 U/L   Alkaline Phosphatase 202 (H) 38 - 126 U/L   Total Bilirubin 1.8 (H) 0.3 - 1.2 mg/dL   GFR, Estimated >60 >60 mL/min    Comment: (NOTE) Calculated using the CKD-EPI Creatinine Equation (2021)    Anion gap 12 5 - 15    Comment:  Performed at [facility], [place] [address], [place], [place] [address]  Lactic acid, plasma     Status: None  Collection Time: 07/10/22  8:33 PM  Result Value Ref Range   Lactic Acid, Venous 1.6 0.5 - 1.9 mmol/L    Comment: Performed at [facility], [place] [address] [place], [place] [address]  Lipase, blood     Status: None   Collection Time: 07/10/22  8:33 PM  Result Value Ref Range   Lipase 33 11 - 51 U/L    Comment: Performed at [facility] [address], [place], [place] [address]  Resp panel by RT-PCR (RSV, Flu A&B, Covid) Anterior Nasal Swab     Status: None   Collection Time: 07/10/22  8:37 PM   Specimen: Anterior Nasal Swab  Result Value Ref Range   SARS Coronavirus 2 by RT PCR NEGATIVE NEGATIVE   Influenza A by PCR NEGATIVE NEGATIVE   Influenza B by PCR NEGATIVE NEGATIVE    Comment: (NOTE) The Xpert Xpress SARS-CoV-2/FLU/RSV plus assay is intended as an aid in the diagnosis of influenza from Nasopharyngeal swab specimens and should not be used as a sole basis for treatment. Nasal washings and aspirates are unacceptable for Xpert Xpress SARS-CoV-2/FLU/RSV testing.  Fact Sheet for Patients: EntrepreneurPulse.com.au  Fact Sheet for Healthcare Providers: IncredibleEmployment.be  This test is not yet approved or cleared by the [place] FDA and has been authorized for detection and/or diagnosis of SARS-CoV-2 by FDA under an Emergency Use Authorization (EUA). This EUA will remain in effect (meaning this test can be used) for the duration of the COVID-19 declaration under Section 564(b)(1) of the Act, 21 U.S.C. section 360bbb-3(b)(1), unless the authorization is terminated or revoked.     Resp Syncytial Virus by PCR NEGATIVE NEGATIVE    Comment: (NOTE) Fact Sheet for Patients: EntrepreneurPulse.com.au  Fact Sheet for Healthcare Providers: IncredibleEmployment.be  This test  is not yet approved or cleared by the [place] FDA and has been authorized for detection and/or diagnosis of SARS-CoV-2 by FDA under an Emergency Use Authorization (EUA). This EUA will remain in effect (meaning this test can be used) for the duration of the COVID-19 declaration under Section 564(b)(1) of the Act, 21 U.S.C. section 360bbb-3(b)(1), unless the authorization is terminated or revoked.  Performed at [facility], [place] [address], [place], [place] [address]   Urinalysis, w/ Reflex to Culture (Infection Suspected) -Urine, Catheterized     Status: Abnormal   Collection Time: 07/11/22 12:46 AM  Result Value Ref Range   Specimen Source URINE, CATHETERIZED    Color, Urine YELLOW YELLOW   APPearance CLEAR CLEAR   Specific Gravity, Urine 1.010 1.005 - 1.030   pH 7.5 5.0 - 8.0   Glucose, UA NEGATIVE NEGATIVE mg/dL   Hgb urine dipstick TRACE (A) NEGATIVE   Bilirubin Urine NEGATIVE NEGATIVE   Ketones, ur NEGATIVE NEGATIVE mg/dL   Protein, ur NEGATIVE NEGATIVE mg/dL   Nitrite NEGATIVE NEGATIVE   Leukocytes,Ua NEGATIVE NEGATIVE   Squamous Epithelial / HPF 0-5 0 - 5 /HPF   WBC, UA 0-5 0 - 5 WBC/hpf    Comment: Reflex urine culture not performed if WBC <=10, OR if Squamous epithelial cells >5. If Squamous epithelial cells >5, suggest recollection.   RBC / HPF 0-5 0 - 5 RBC/hpf   Bacteria, UA NONE SEEN NONE SEEN    Comment: Performed at [facility], [place] [address] [place], [place] [address]   CT CHEST ABDOMEN PELVIS W CONTRAST  Result Date: 07/11/2022 CLINICAL DATA:  Possible sepsis EXAM: CT CHEST, ABDOMEN, AND PELVIS WITH CONTRAST  TECHNIQUE: Multidetector CT imaging of the chest, abdomen and pelvis was performed following the standard protocol during bolus administration of intravenous contrast. RADIATION DOSE REDUCTION: This exam was performed according to the departmental dose-optimization program which includes automated exposure control, adjustment of the mA  and/or kV according to patient size and/or use of iterative reconstruction technique. CONTRAST:  17m OMNIPAQUE IOHEXOL 350 MG/ML SOLN COMPARISON:  Chest x-ray from earlier in the same day. FINDINGS: CT CHEST FINDINGS Cardiovascular: Atherosclerotic calcifications are noted without aneurysmal dilatation or dissection. Coronary calcifications are noted. No cardiac enlargement is seen. The pulmonary artery as visualized shows no evidence of pulmonary embolus although not timed for embolus evaluation. Mediastinum/Nodes: Thoracic inlet is within normal limits. No hilar or mediastinal adenopathy is noted. The esophagus is within normal limits. Lungs/Pleura: Subpleural fibrotic changes are noted. The basilar infiltrate seen on prior chest x-ray is not borne out on today's CT. No focal confluent infiltrate is seen. No sizable effusion is noted. Some nodularity along the superior aspect of the major fissure on the right is noted best seen on image number 41 of series 5. This is new from a prior CT from 2022 but likely represents a Peri fissural lymph node. No follow-up is recommended. Musculoskeletal: Degenerative changes of the thoracic spine are noted. CT ABDOMEN PELVIS FINDINGS Hepatobiliary: Gallbladder is well distended although no cholelithiasis is seen. Dilatation of the common bile duct is noted to the level of the ampulla of Vater similar to that seen on the prior exam. No obstructing lesion is noted. This may represent the patient's steady-state. Correlate with laboratory values. Pancreas: Unremarkable. No pancreatic ductal dilatation or surrounding inflammatory changes. Spleen: Normal in size without focal abnormality. Adrenals/Urinary Tract: Adrenal glands are within normal limits. Kidneys demonstrate a normal enhancement pattern bilaterally. A upper pole cyst is noted on the left measuring 1.5 cm. This is simple in nature and no follow-up is recommended. This is stable from the prior exam. The bladder is  partially distended. Stomach/Bowel: Diverticular change of the colon is noted without evidence of diverticulitis. Fecal material is noted scattered throughout the colon without obstructive change. The appendix is within normal limits. No obstructive or inflammatory changes of the small bowel stomach. Vascular/Lymphatic: Aortic atherosclerosis. No enlarged abdominal or pelvic lymph nodes. Reproductive: Status post hysterectomy. No adnexal masses. Other: No abdominal wall hernia or abnormality. No abdominopelvic ascites. Musculoskeletal: Degenerative changes of lumbar spine are noted. IMPRESSION: CT of the chest: No acute abnormality is noted in the chest. CT of the abdomen and pelvis: Distended gallbladder without evidence of cholelithiasis. Stable dilatation of the common bile duct is noted unchanged from 2022. This likely represents the patient's steady state. Diverticulosis without diverticulitis. No other focal abnormality is noted. Electronically Signed   By: MInez CatalinaM.D.   On: 07/11/2022 00:10   DG Chest Port 1 View  Result Date: 07/10/2022 CLINICAL DATA:  Fever EXAM: PORTABLE CHEST 1 VIEW COMPARISON:  09/14/2021 FINDINGS: Single frontal view of the chest demonstrates an unremarkable cardiac silhouette. There is chronic background scarring, greatest at the lung bases. Superimposed patchy consolidation at the left lung base as well as trace left pleural effusion consistent with bronchopneumonia. No pneumothorax. No acute bony abnormalities. IMPRESSION: 1. Patchy left basilar bronchopneumonia small left parapneumonic effusion. Electronically Signed   By: MRanda NgoM.D.   On: 07/10/2022 20:32    Pending Labs Unresulted Labs (From admission, onward)     Start     Ordered   07/11/22 0500  CBC  Tomorrow morning,   R        07/11/22 0137   07/11/22 0500  Comprehensive metabolic panel  Tomorrow morning,   R        07/11/22 0137   07/11/22 0500  Protime-INR  Tomorrow morning,   R        07/11/22  0137   07/11/22 0500  APTT  Tomorrow morning,   R        07/11/22 0137   07/11/22 0137  Protime-INR  Once,   R        07/11/22 0137   07/11/22 0137  APTT  Once,   R        07/11/22 0137   07/11/22 0137  Procalcitonin - Baseline  ONCE - URGENT,   URGENT        07/11/22 0137   07/11/22 0137  Respiratory (~20 pathogens) panel by PCR  (Respiratory panel by PCR (~20 pathogens, ~24 hr TAT)  w precautions)  Once,   R        07/11/22 0137   07/10/22 2231  Hepatitis panel, acute  Once,   URGENT        07/10/22 2230   07/10/22 2015  Blood culture (routine x 2)  BLOOD CULTURE X 2,   R (with STAT occurrences)      07/10/22 2014            Vitals/Pain Today's Vitals   07/10/22 2330 07/10/22 2354 07/11/22 0100 07/11/22 0115  BP: (!) 105/49 (!) 105/53 122/67   Pulse: 78 78 79   Resp: (!) 25 14 19   $ Temp:    98.2 F (36.8 C)  TempSrc:    Rectal  SpO2: 96% 99% 100%   Weight:      Height:      PainSc:        Isolation Precautions Droplet precaution  Medications Medications  metroNIDAZOLE (FLAGYL) IVPB 500 mg (0 mg Intravenous Stopped 07/11/22 0059)  enoxaparin (LOVENOX) injection 40 mg (has no administration in time range)  busPIRone (BUSPAR) tablet 5 mg (has no administration in time range)  flecainide (TAMBOCOR) tablet 100 mg (has no administration in time range)    And  flecainide (TAMBOCOR) tablet 50 mg (has no administration in time range)  sodium chloride 0.9 % bolus 500 mL (0 mLs Intravenous Stopped 07/10/22 2155)  ondansetron (ZOFRAN) injection 4 mg (4 mg Intravenous Given 07/10/22 2048)  acetaminophen (TYLENOL) tablet 650 mg (650 mg Oral Given 07/10/22 2102)  cefTRIAXone (ROCEPHIN) 1 g in sodium chloride 0.9 % 100 mL IVPB (0 g Intravenous Stopped 07/10/22 2132)  azithromycin (ZITHROMAX) 500 mg in sodium chloride 0.9 % 250 mL IVPB (0 mg Intravenous Stopped 07/10/22 2239)  sodium chloride 0.9 % bolus 1,000 mL (0 mLs Intravenous Stopped 07/11/22 0008)  iohexol (OMNIPAQUE) 350 MG/ML  injection 75 mL (75 mLs Intravenous Contrast Given 07/10/22 2354)    Mobility walks with person assist     Focused Assessments     R Recommendations: See Admitting Provider Note  Report given to:   Additional Notes:

## 2022-07-11 NOTE — Evaluation (Signed)
Physical Therapy Evaluation Patient Details Name: [name] [name] MRN: [id number] DOB: 06/05/1929 Today's Date: 07/11/2022  History of Present Illness  Pt is a 89 y.o female presenting 2/17 with fever, vomiting abdominal pain, and body aches over the past week. CT abdomen/pelvis without signs of intra-abdominal infection. PMH significant for anemia, anxiety, HTN, esophageal stricture, symptomatic PACs, diverticulosis, chronic CBD dilation.  Clinical Impression  Patient mobilizing well and able to walk hallway and negotiate some steps.  She was previously living independent with her son right next door and able to complete most IADL's though son provided transportation.  She will benefit from skilled PT in the acute setting to ensure safety for d/c home with intermittent assist.    Likely will not need follow up PT at d/c.      Recommendations for follow up therapy are one component of a multi-disciplinary discharge planning process, led by the attending physician.  Recommendations may be updated based on patient status, additional functional criteria and insurance authorization.  Follow Up Recommendations No PT follow up      Assistance Recommended at Discharge Intermittent Supervision/Assistance  Patient can return home with the following  Assistance with cooking/housework;Assist for transportation;Help with stairs or ramp for entrance    Equipment Recommendations None recommended by PT  Recommendations for Other Services       Functional Status Assessment Patient has had a recent decline in their functional status and demonstrates the ability to make significant improvements in function in a reasonable and predictable amount of time.     Precautions / Restrictions Precautions Precautions: Fall Restrictions Weight Bearing Restrictions: No      Mobility  Bed Mobility               General bed mobility comments: up at sink with OT    Transfers     Transfers: Sit to/from  Stand Sit to Stand: Supervision           General transfer comment: stand to sit to recliner    Ambulation/Gait Ambulation/Gait assistance: Supervision Gait Distance (Feet): 200 Feet Assistive device: None Gait Pattern/deviations: Step-through pattern, Decreased stride length, Drifts right/left, Trunk flexed       General Gait Details: mild veering L to R but no LOB and no UE support provided  Stairs Stairs: Yes Stairs assistance: Min guard Stair Management: One rail Left, Alternating pattern, Forwards Number of Stairs: 3 (x 2) General stair comments: assist for safety, using rail appropriately  Wheelchair Mobility    Modified Rankin (Stroke Patients Only)       Balance Overall balance assessment: Needs assistance   Sitting balance-Leahy Scale: Good     Standing balance support: During functional activity, No upper extremity supported Standing balance-Leahy Scale: Good Standing balance comment: standing at sink with OT brushing teeth                             Pertinent Vitals/Pain Pain Assessment Pain Assessment: No/denies pain    Home Living Family/patient expects to be discharged to:: Private residence Living Arrangements: Alone Available Help at Discharge: Family;Available 24 hours/day Type of Home: House Home Access: Stairs to enter   [employer] of Steps: threshold Alternate Level Stairs-Number of Steps: 13 to basement Home Layout: Laundry or work area in basement;Two level Home Equipment: Cane - single Barista (2 wheels);Rollator (4 wheels);Shower seat Additional Comments: son lives beside her    Prior Function Prior Level of Function :  Independent/Modified Independent             Mobility Comments: no AD ADLs Comments: independent, does not drive; son performs home management tasks/yard work. Son goes with her to doctors appointments and grocery store.     Hand Dominance   Dominant Hand: Right     Extremity/Trunk Assessment   Upper Extremity Assessment Upper Extremity Assessment: Defer to OT evaluation    Lower Extremity Assessment Lower Extremity Assessment: Overall WFL for tasks assessed    Cervical / Trunk Assessment Cervical / Trunk Assessment: Normal  Communication   Communication: No difficulties  Cognition Arousal/Alertness: Awake/alert Behavior During Therapy: WFL for tasks assessed/performed Overall Cognitive Status: Within Functional Limits for tasks assessed                                          General Comments General comments (skin integrity, edema, etc.): Pt reports she has been looking to move into an apartment or smaller house    Exercises     Assessment/Plan    PT Assessment Patient needs continued PT services  PT Problem List Decreased strength;Decreased activity tolerance;Decreased balance       PT Treatment Interventions DME instruction;Functional mobility training;Balance training;Patient/family education;Gait training;Therapeutic activities;Stair training;Therapeutic exercise    PT Goals (Current goals can be found in the Care Plan section)  Acute Rehab PT Goals Patient Stated Goal: to return to independent PT Goal Formulation: With patient Time For Goal Achievement: 07/25/22 Potential to Achieve Goals: Good    Frequency Min 3X/week     Co-evaluation               AM-PAC PT "6 Clicks" Mobility  Outcome Measure Help needed turning from your back to your side while in a flat bed without using bedrails?: A Little Help needed moving from lying on your back to sitting on the side of a flat bed without using bedrails?: A Little Help needed moving to and from a bed to a chair (including a wheelchair)?: A Little Help needed standing up from a chair using your arms (e.g., wheelchair or bedside chair)?: None Help needed to walk in hospital room?: None Help needed climbing 3-5 steps with a railing? : A Little 6  Click Score: 20    End of Session Equipment Utilized During Treatment: Gait belt Activity Tolerance: Patient tolerated treatment well Patient left: in chair;with chair alarm set;with call bell/phone within reach Nurse Communication: Mobility status PT Visit Diagnosis: Muscle weakness (generalized) (M62.81)    Time: [id number] PT Time Calculation (min) (ACUTE ONLY): 17 min   Charges:   PT Evaluation $PT Eval Low Complexity: 1 Low          Magda Kiel, PT Acute Rehabilitation Services [phone] 07/11/2022   Reginia Naas 07/11/2022, 1:50 PM

## 2022-07-11 NOTE — Progress Notes (Addendum)
NAME:  [name] [name], MRN:  [id number], DOB:  1929/10/07, LOS: 0 ADMISSION DATE:  07/10/2022  Subjective  Patient evaluated at bedside this AM.  Reports doing well, denies abdominal pain, nausea, or vomiting.  She also has not had any chills since admission.  Reports anxiety is under control.  She reports diminished appetite and poor oral intake.  Objective   Blood pressure (!) 109/55, pulse 76, temperature 98.3 F (36.8 C), temperature source Oral, resp. rate 16, height 5' 5.5" (1.664 m), weight 66.5 kg, last menstrual period 05/24/1970, SpO2 98 %.     Intake/Output Summary (Last 24 hours) at 07/11/2022 1354 Last data filed at 07/11/2022 0800 Gross per 24 hour  Intake 1196.5 ml  Output 400 ml  Net 796.5 ml   Filed Weights   07/10/22 2003 07/11/22 0256  Weight: 66.8 kg 66.5 kg   Physical Exam General: No acute distress. Awake and conversant. Eyes: Normal conjunctiva, anicteric. Round symmetric pupils. ENT: Hearing grossly intact. No nasal discharge. Neck: Neck is supple. No masses or thyromegaly. Respiratory: Respirations are non-labored. No wheezing. Skin: Warm. No rashes or ulcers. Psych: Alert and oriented. Cooperative, Appropriate mood and affect, Normal judgment. CV: No lower extremity edema. MSK: Normal ambulation. No clubbing or cyanosis. Neuro: Sensation and CN II-XII grossly normal.   Labs       Latest Ref Rng & Units 07/11/2022    2:42 AM 07/10/2022    8:33 PM 06/04/2021   12:44 PM  CBC  WBC 4.0 - 10.5 K/uL 4.8  5.1  3.9   Hemoglobin 12.0 - 15.0 g/dL 11.7  12.6  12.6   Hematocrit 36.0 - 46.0 % 36.2  36.9  38.2   Platelets 150 - 400 K/uL 201  223  238       Latest Ref Rng & Units 07/11/2022    2:42 AM 07/10/2022    8:33 PM 06/04/2021   12:44 PM  BMP  Glucose 70 - 99 mg/dL 113  148  104   BUN 8 - 23 mg/dL 13  17  14   $ Creatinine 0.44 - 1.00 mg/dL 0.87  0.79  0.70   Sodium 135 - 145 mmol/L 137  136  137   Potassium 3.5 - 5.1 mmol/L 3.7  3.5  4.0   Chloride 98 -  111 mmol/L 107  101  99   CO2 22 - 32 mmol/L 23  23  28   $ Calcium 8.9 - 10.3 mg/dL 8.1  8.9  9.5     Summary  [name] [name] is a 89 y/o female with a pmh of HTN,symptomatic PACs, diverticulosis, chronic CBD dilatation, esophageal strictures and spasm who presents with 1 day of N/V unrelated to food and a 2-3 days of worsening chills   Assessment & Plan:  Principal Problem:   Fever Active Problems:   Anxiety   Essential hypertension   PAC (premature atrial contraction)   Hypercontractile esophagus   Transaminitis  FeverN/V Afebrile overnight, no chills, denies n/v. BCx showed no growth at 24 hours. COVID and Influenza negative. PT 16.5, aPTT 27, and INR 1.3. UA findings unremarkable. Hgb 11.7, likely dilutional in the setting of IV fluids. CBC otherwise unremarkable. Acetaminophen <10. Pro-calcitonin 1.17. Currently suspect elevated LFT's 2/2/ ischemic hepatitis vs viral hepatitis int he setting of poor PO intake and dehydration as patient reports she has had very poor appetite. . Could consider MRCP if persistent transaminase elevation to evaluate biliary system, intrahepatic mass, hepatic vasculature. - F/u Resp panel -  F/u cultures - Add NS 75 mL/hr x1 - Encourage po hydration    HTN Normotensive this morning.  -Continue to hold metoprolol 25   Symptomatic PACs -Continue Flecainide 169m daily and 533mqhs   Esophageal stricture Esophageal spasm Concern for poor oral intake.  -dyspaghia 1 diet   Anxiety Stable today.  -Buspirone 17m57maily     Dispo: Admit patient to Observation with expected length of stay less than 2 midnights. Best practice:  DIET: Dysphagia 1 IVF: 0.9 NS DVT PPX: Lovenox BOWEL: None CODE:Full  [name] SlatesD Internal Medicine Resident PGY-1 PAGER: [phone]/2024 1:54 PM  If after hours (below), please contact on-call pager: [phone]-7AM Monday-Friday 1PM-7AM Saturday-Sunday

## 2022-07-11 NOTE — H&P (Signed)
Date: 07/11/2022               Patient Name:  [name] [name] MRN: [id number]  DOB: 01-30-1930 Age / Sex: 89 y.o., female   PCP: Iona Coach, MD         Medical Service: Internal Medicine Teaching Service         Attending Physician: Dr. Gilles Chiquito, MD    First Contact: Dr. Nikki Dom, Joe,DO Pager: [phone]   Second Contact: Dr. Jennet Maduro Pager: [phone]        After Hours (After 5p/  First Contact Pager: [phone]  weekends / holidays): Second Contact Pager: [phone]   Chief Complaint: Chills  History of Present Illness:  [name] [name] is a 89 y/o female with a pmh of HTN,symptomatic PACs, diverticulosis, chronic CBD dilatation, esophageal strictures and spasm who presents with 1 day of N/V unrelated to food and a 2-3 days of worsening chills.Abdominal pain x 2 last week. Not this week. No pain after eating.  Tiny amount emesis x 3, non bloody. No diarrhea or constipation. No new food, raw seafood. Just recently start back on solid food. Good liquid intake. No hematochezia Last night, chills got worse, couldn't get warm. Endorses headache but no jaw pain. No new vision change. Some non-positional dizziness.Feeling tired and weak. No CP or cough. Some heart racing and some R lung tightness last week.No Dysuria but strong odor of urine. No hematuria.No new joint pain.New rash on neck and face. Have been able to function last couple days until today.No tylenol or alcohol. Compliance w meds. Taking carotene.No sick contacts.  ED course: Febrile to 101.2. CMP significant for AST/ALT 1391/814, alk phos 202, tbili 1.8. CBC wnl.Lactate wnl at 1.6. CT A/P with distended gallbladder and stable chronic cbd dilatation,without acute intraabdominal or pelvic pathology. RPP negative. EKG with no ischemic changes. Meds:  Current Outpatient Medications  Medication Instructions   AMBULATORY NON FORMULARY MEDICATION Peppermint altoids<BR>1-2 tabs as needed    busPIRone (BUSPAR) 5 mg, Oral, 2 times  daily   flecainide (TAMBOCOR) 50 MG tablet TAKE 2 TABLETS EVERY MORNING AND 1 TABLET AT NIGHT   metoprolol succinate (TOPROL XL) 25 MG 24 hr tablet May take 1 tablet by mouth daily.     Allergies: Allergies as of 07/10/2022 - Review Complete 07/10/2022  Allergen Reaction Noted   Other Anaphylaxis 07/22/2017   Pantoprazole Other (See Comments) 07/22/2017   Zyrtec [cetirizine] Palpitations 04/11/2014   Ciprofloxacin  03/09/2019   Nifedipine  06/25/2020   Past Medical History:  Diagnosis Date   Anemia    iron def   Anxiety    Arrhythmia    paroxysmal SVT and symtomatic PAC's   ATRIAL PREMATURE BEATS 03/20/2009       Lovena Le, MD, Leonidas Romberg, Binnie Kand      Cerumen impaction 08/27/2021   Common bile duct dilation 04/02/2021   Diverticulosis    colon   Dysuria 03/07/2019   Elevated troponin    Gastritis and gastroduodenitis    GERD 04/02/2008   Qualifier: Diagnosis of  By: Jenny Reichmann MD, Hunt Oris    History of colonic polyps    cecal   Hypertension    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 Requires assistance driving, shopping, cooking. Able to clean, bath, toilet,  manage money and medications, walk without assistance. Son lives next door. No alcohol, tobacco or other illicit substances.  Review of Systems: A complete ROS was negative except as per HPI.   Physical Exam: Blood pressure 122/67, pulse 79, temperature 98.2 F (36.8 C), temperature source Rectal, resp. rate 19, height 5' (1.524 m), weight 66.8 kg, last menstrual period 05/24/1970, SpO2 100 %. [id number], well nourished, well developed, no acute distress HEENT:no scleral icterus, EOMI, PERRL, dry mucous membranes CV:RRR, normal s1/s2, no m/r/g, 2+ bilateral radial and DP pulses  Pulm:normal wob, Crackles at the left lung base Abd: soft, NT,ND, tympanitic to percussion, no rebound or guarding, no murphy sign, no palpable masses or organomegaly Skin: no juandice Extremities: warm, dry, no LE edema  EKG: personally reviewed my interpretation is NSR, regular rate, first degree AV block, no ST/T wave changes  CXR: personally reviewed my interpretation is no consolidation, effusion, increased interstitial markings or pneumothorax. No acute cardiopulmonary process.  Assessment & Plan by Problem: Principal Problem:   Fever Active Problems:   Anxiety   Essential hypertension   PAC (premature atrial contraction)   Hypercontractile esophagus   Transaminitis  [name] [name] is a 89 y/o female with a pmh of HTN,symptomatic PACs, diverticulosis, chronic CBD dilatation, esophageal strictures and spasm who presents with 1 day of N/V unrelated to food and a 2-3 days of worsening chills  FeverN/V Presented with N/V for 1 day and febrile to 101.2 in the ED but otherwise afebrile at home with chills. Reports some epigastric abdominal pain worse than her esophageal spasm last week unrelated to food that resolved after a few days. Denies URI symptoms,diarrhea or urinary symptoms.EKG without cardiac ischemia.CBC showing wbc wnl at 5.1 and also otherwise grossly normal. CMP significant for AST/ALT 1391/814, alk  phos 202, tbili 1.8. Lactate wnl at 1.6. Lipase normal. CT A/P with distended gallbladder and stable chronic cbd dilatation,without acute intraabdominal or pelvic pathology. RPP negative. Does not meet sepsis criteria. Acute rise in transaminases is generally concerning for hepatitis secondary to alcohol, acute viral hepatitis, EBV/CMV/other viral illness, tylenol, shock liver. Given no juandice, minimal N/V, benign abdominal exam and no risk factors do not suspect viral hepatitis. Given no use of alcohol or tylenol do not suspect these as etiologies. Given no observed low BP and no AKI not as likely that this is ischemic hepatitis. This could be an EBV/CMV viral infection. Low concern for veno occlusive disease which was not visualized on CT w contrast. Would not expect any malignancy to acutely present in this way, also nothing  visualized on CT. Low concern for bacteremia given wbc count wnl ,no tachycardia,no pulmonary symptoms or pulmonary pathology on imaging, UA normal, skin exam normal. Unclear what is causing her hepatitis. Will get INR but she is A&O x4 and not encephalopathic so does not meet criteria for acute liver failure regardless. Could consider MRCP if persistent transaminase elevation to evaluate biliary system, intrahepatic mass, hepatic vasculature. -f/u tylenol level -f/u bcx, full RVP -f/u acute hepatitis panel -f/u INR -f/u procal -trend CMP  -IV flagyl, IV ceftriaxone  HTN BP down to [id number] systolic, holding home meds. -hold metoprolol 25  Symptomatic PACs -flecainide 112m daily and 549mqhs  Esophageal stricture Esophageal spasm -dyspaghia 1 diet  Anxiety -Buspirone 28m20maily   Dispo: Admit patient to Observation with expected length of stay less than 2 midnights.  Signed: RogIona CoachD 07/11/2022, 1:42 AM  Pager: TylIona CoachD Internal Medicine Resident, PGY-1 [facility]  Pager: [phone] 5pm on weekdays and 1pm on  weekends: On Call pager: [phone]

## 2022-07-11 NOTE — Hospital Course (Addendum)
-  Chills 2-3 days. Not checking temp at home  Last night, chills got worse, couldn't get warm.  -Headache but no jaw pain. No new vision change -Dizzy, tired, heart racing -SOB today. No CP or cough  -Abd pain x 2 last week. Not this week. No pain after eating. Nausea. Tiny amount emesis x 3, non bloody. No diarrhea or constipation. No new food, raw seafood. Just recently start back on solid food. Good liquid intake. No hematochezia  -No Dysuria but strong odor of urine. No hematuria. -No new joint pain -New rash on neck and face.  -Have been able to function last couple days until today. -No tylenol or alcohol. Compliance w meds. Taking carotene  -No sick contact  Family Family History  Problem Relation Age of Onset   Stroke Father        @ 58   Hypertension Father    Hypertension Sister    Cancer Sister 37       brain   Breast cancer Other 62       breast   Breast cancer Daughter 72       spread to her brain   Cancer Other 64       colon/MELANOMA   Stroke Mother        medication induced   Kidney disease Brother    Cancer Brother      Social  -Independent w ADLS.  -No smoking, alcohol, drugs PCP  -------------- 2/19: Patient is feeling well overall today. She reports that she has decreased appetite at baseline. Patient reports that she is drinking appropriately but hasn't eaten much as she has not not been hungry.

## 2022-07-11 NOTE — Evaluation (Signed)
Occupational Therapy Evaluation Patient Details Name: [name] [name] MRN: [id number] DOB: 1929/06/13 Today's Date: 07/11/2022   History of Present Illness Pt is a 89 y.o female presenting 2/17 with fever, vomiting abdominal pain, and body aches over the past week. CT abdomen/pelvis without signs of intra-abdominal infection. PMH significant for anemia, anxiety, HTN, esophageal stricture, symptomatic PACs, diverticulosis, chronic CBD dilation.   Clinical Impression   PTA, pt lived alone and was independent for ADL and IADL within the home; son drives her to appointments, the grocery store, and does yard work. Upon eval, pt performing ADL at distant supervision to mod I level. With good recall of events leading up to hospitalization. Recommending discharge home with no OT follow up at this time.      Recommendations for follow up therapy are one component of a multi-disciplinary discharge planning process, led by the attending physician.  Recommendations may be updated based on patient status, additional functional criteria and insurance authorization.   Follow Up Recommendations  No OT follow up     Assistance Recommended at Discharge Intermittent Supervision/Assistance  Patient can return home with the following Help with stairs or ramp for entrance;Assist for transportation;Assistance with cooking/housework    Functional Status Assessment  Patient has had a recent decline in their functional status and demonstrates the ability to make significant improvements in function in a reasonable and predictable amount of time.  Equipment Recommendations  None recommended by OT    Recommendations for Other Services       Precautions / Restrictions Precautions Precautions: Fall Restrictions Weight Bearing Restrictions: No      Mobility Bed Mobility Overal bed mobility: Modified Independent                  Transfers Overall transfer level: Needs assistance Equipment used:  None Transfers: Sit to/from Stand Sit to Stand: Supervision           General transfer comment: for safety/management of lines/leads      Balance                                           ADL either performed or assessed with clinical judgement   ADL Overall ADL's : Needs assistance/impaired Eating/Feeding: Independent   Grooming: Wash/dry hands;Wash/dry face;Oral care;Supervision/safety;Standing Grooming Details (indicate cue type and reason): distant supervision for safety Upper Body Bathing: Set up;Sitting   Lower Body Bathing: Supervison/ safety;Sit to/from stand   Upper Body Dressing : Set up;Sitting   Lower Body Dressing: Supervision/safety;Sit to/from stand Lower Body Dressing Details (indicate cue type and reason): donning new socks during session Toilet Transfer: Supervision/safety;Ambulation Toilet Transfer Details (indicate cue type and reason): supervision for lines/leads Toileting- Clothing Manipulation and Hygiene: Supervision/safety;Sitting/lateral lean       Functional mobility during ADLs: Supervision/safety General ADL Comments: Handoff to PT     Vision Baseline Vision/History: 0 No visual deficits Ability to See in Adequate Light: 0 Adequate Patient Visual Report: No change from baseline Vision Assessment?: No apparent visual deficits Additional Comments: able to locate all items needed for grooming at sink     Perception Perception Perception Tested?: No   Praxis Praxis Praxis tested?: Not tested    Pertinent Vitals/Pain Pain Assessment Pain Assessment: No/denies pain     Hand Dominance Right   Extremity/Trunk Assessment Upper Extremity Assessment Upper Extremity Assessment: Generalized weakness   Lower Extremity Assessment Lower  Extremity Assessment: Defer to PT evaluation   Cervical / Trunk Assessment Cervical / Trunk Assessment: Normal   Communication Communication Communication: No difficulties    Cognition Arousal/Alertness: Awake/alert Behavior During Therapy: WFL for tasks assessed/performed Overall Cognitive Status: Impaired/Different from baseline Area of Impairment: Problem solving                             Problem Solving: Slow processing General Comments: intermittent increased time for processing, otherwise following up to two step commands and [facility] for tasks assessed     General Comments  Pt reports she has been looking to move into an apartment or smaller house    Exercises     Shoulder Instructions      Home Living Family/patient expects to be discharged to:: Private residence Living Arrangements: Alone Available Help at Discharge: Family;Available 24 hours/day Type of Home: House Home Access: Stairs to enter [employer] of Steps: threshold   Home Layout: Laundry or work area in basement;Two level (has basement) Alternate Level Stairs-Number of Steps: 13 Alternate Level Stairs-Rails: Right;Left (spiral staircase) Bathroom Shower/Tub: Tub/shower unit;Walk-in shower (uses tub shower)         Home Equipment: Cane - single Barista (2 wheels);Rollator (4 wheels);Shower seat   Additional Comments: son lives beside her      Prior Functioning/Environment Prior Level of Function : Independent/Modified Independent             Mobility Comments: no AD ADLs Comments: independent, does not drive; son performs home management tasks/yard work. Son goes with her to doctors appointments and grocery store.        OT Problem List: Decreased strength;Impaired balance (sitting and/or standing);Decreased activity tolerance;Decreased cognition      OT Treatment/Interventions: Self-care/ADL training;Therapeutic exercise;Patient/family education;Balance training;Therapeutic activities    OT Goals(Current goals can be found in the care plan section) Acute Rehab OT Goals Patient Stated Goal: go home OT Goal Formulation: With  patient Time For Goal Achievement: 07/25/22 Potential to Achieve Goals: Good  OT Frequency: Min 2X/week    Co-evaluation              AM-PAC OT "6 Clicks" Daily Activity     Outcome Measure Help from another person eating meals?: None Help from another person taking care of personal grooming?: A Little Help from another person toileting, which includes using toliet, bedpan, or urinal?: A Little Help from another person bathing (including washing, rinsing, drying)?: A Little Help from another person to put on and taking off regular upper body clothing?: A Little Help from another person to put on and taking off regular lower body clothing?: A Little 6 Click Score: 19   End of Session Equipment Utilized During Treatment: Gait belt Nurse Communication: Mobility status  Activity Tolerance:   Patient left: Other (comment) (in hall with PT)  OT Visit Diagnosis: Unsteadiness on feet (R26.81);Muscle weakness (generalized) (M62.81);Other abnormalities of gait and mobility (R26.89);Other symptoms and signs involving cognitive function                Time: [id number] OT Time Calculation (min): 22 min Charges:  OT General Charges $OT Visit: 1 Visit OT Evaluation $OT Eval Low Complexity: 1 Low  Elder Cyphers, OTR/L [facility] Acute Rehabilitation Office: [phone]   Magnus Ivan 07/11/2022, 1:39 PM

## 2022-07-11 NOTE — Plan of Care (Signed)
  Problem: Education: Goal: Knowledge of General Education information will improve Description Including pain rating scale, medication(s)/side effects and non-pharmacologic comfort measures Outcome: Progressing   

## 2022-07-12 DIAGNOSIS — R5081 Fever presenting with conditions classified elsewhere: Secondary | ICD-10-CM | POA: Diagnosis not present

## 2022-07-12 LAB — GLUCOSE, CAPILLARY
Glucose-Capillary: 94 mg/dL (ref 70–99)
Glucose-Capillary: 77 mg/dL (ref 70–99)

## 2022-07-12 LAB — CBC
HCT: 31.6 % — ABNORMAL LOW (ref 36.0–46.0)
Hemoglobin: 10.6 g/dL — ABNORMAL LOW (ref 12.0–15.0)
MCH: 32.6 pg (ref 26.0–34.0)
MCHC: 33.5 g/dL (ref 30.0–36.0)
MCV: 97.2 fL (ref 80.0–100.0)
Platelets: 170 10*3/uL (ref 150–400)
RBC: 3.25 MIL/uL — ABNORMAL LOW (ref 3.87–5.11)
RDW: 13.2 % (ref 11.5–15.5)
WBC: 3.4 10*3/uL — ABNORMAL LOW (ref 4.0–10.5)
nRBC: 0 % (ref 0.0–0.2)

## 2022-07-12 LAB — COMPREHENSIVE METABOLIC PANEL
ALT: 353 U/L — ABNORMAL HIGH (ref 0–44)
AST: 272 U/L — ABNORMAL HIGH (ref 15–41)
Albumin: 2.8 g/dL — ABNORMAL LOW (ref 3.5–5.0)
Alkaline Phosphatase: 143 U/L — ABNORMAL HIGH (ref 38–126)
Anion gap: 9 (ref 5–15)
BUN: 14 mg/dL (ref 8–23)
CO2: 23 mmol/L (ref 22–32)
Calcium: 8.4 mg/dL — ABNORMAL LOW (ref 8.9–10.3)
Chloride: 108 mmol/L (ref 98–111)
Creatinine, Ser: 0.64 mg/dL (ref 0.44–1.00)
GFR, Estimated: >60 mL/min (ref >60)
Glucose, Bld: 92 mg/dL (ref 70–99)
Potassium: 3.5 mmol/L (ref 3.5–5.1)
Sodium: 140 mmol/L (ref 135–145)
Total Bilirubin: 0.9 mg/dL (ref 0.3–1.2)
Total Protein: 5.2 g/dL — ABNORMAL LOW (ref 6.5–8.1)

## 2022-07-12 NOTE — Plan of Care (Signed)

## 2022-07-12 NOTE — Discharge Summary (Signed)
Name: [name] [name] MRN: [id number] DOB: 07/29/1929 89 y.o. PCP: Iona Coach, MD  Date of Admission: 07/10/2022  7:42 PM Date of Discharge: 07/12/22  Attending Physician: Angelica Pou, MD  Discharge Diagnosis: 1. Principal Problem:   Fever Active Problems:   Anxiety   Essential hypertension   PAC (premature atrial contraction)   Hypercontractile esophagus   Transaminitis  Discharge Medications: Allergies as of 07/12/2022       Reactions   Other Anaphylaxis   Reaction to GI Cocktail   Pantoprazole Other (See Comments)   Upset stomach and weight loss   Zyrtec [cetirizine] Palpitations   Ciprofloxacin    Rash with burning all over body   Nifedipine    Caused low BP        Medication List     TAKE these medications    AMBULATORY NON FORMULARY MEDICATION Peppermint altoids 1-2 tabs as needed   busPIRone 5 MG tablet Commonly known as: BUSPAR TAKE 1 TABLET TWICE A DAY What changed: when to take this   Ensure Take 237 mLs by mouth 4 (four) times daily.   flecainide 50 MG tablet Commonly known as: TAMBOCOR TAKE 2 TABLETS EVERY MORNING AND 1 TABLET AT NIGHT What changed: See the new instructions.   hyoscyamine 0.125 MG tablet Commonly known as: LEVSIN Take 0.125 mg by mouth daily as needed (for esophogeal spasms).        Disposition and follow-up:   [name] [name] was discharged from [facility] in Stable condition.  At the hospital follow up visit please address:  1. Fever, nausea, vomiting in the setting of transaminitis Differential: Acute Viral Hepatitis, Ischemic Hepatitis, Idiosyncratic drug interaction (flecainide, most commonly occurs 1-6 weeks of starting medication) -Ensure patient has followed up with IMTS clinic 2/22 -AST/ALT stable at discharge, f/u levels  HTN -Continue home metoprolol, consider adjusting dosage or d/c'ing if normotensive  Esophageal stricture Esophageal spasm  - Stable at discharge -  Ensure adequate oral intake at home  Symptomatic PACs - Ensure she has f/u appt with Cardiology 4/10 - Continue home meds: flecainide 146m qd + 56mqhs  2.  Labs / imaging needed at time of follow-up: LFTs  3.  Pending labs/ test needing follow-up: None  Follow-up Appointments:  Future Appointments  Date Time Provider DeAyr2/22/2024  9:45 AM RoIona CoachMD [facility]20[facility]  9:45 AM TaEvans LanceMD [facility] by problem list:  Fever, n/v, transaminitis, like 2/2 viral illness, resolved Patient presented to ED for 1 day hx of nausea and vomiting, found to be febrile to 101.2 F. EKG without cardiac ischemia.CBC showing wbc wnl at 5.1 and also otherwise grossly normal. CMP significant for AST/ALT 1391/814, alk phos 202, tbili 1.8. Lactate wnl at 1.6. Lipase normal. CT A/P with distended gallbladder and stable chronic cbd dilatation,without acute intraabdominal or pelvic pathology. RPP negative. Does not meet sepsis criteria. Acute rise in transaminases is generally concerning for hepatitis secondary to alcohol, acute viral hepatitis, EBV/CMV/other viral illness, tylenol, shock liver, all of which were ruled out during hospitalization. No tylenol toxicity (<10), negative ethanol,  negative hepatitis panel (A/B/C), negative respiratory panel, and no growth on blood cultures. IV antibiotics, flagyl and rocephin, discontinued.  Patient remained afebrile for remainder of hospitalization, no chills, n/v on day of discharge . Repeat CMP showed downtrending LFTs AST(1,391>916>272), ALT (8TY:[id number] ALP (2IT:6250817 and bilirubin (1.4>0.9).   HTN, chronic stable Remained normotensive  during hospitalization. Home metoprolol held.    Symptomatic PACs, chronic stable Continued home medications on admission, Flecainide 136m daily and 568mqhs. Asymptomatic.    Esophageal stricture Esophageal spasm, chronic stable Concern for poor  oral intake, was given IVF and placed on dysphagia 1 diet.    Anxiety, chronic stable Remained stable. Continued home Buspirone 16m29maily.  Discharge Exam:   BP (!) 134/56 (BP Location: Right Arm)   Pulse (!) 57   Temp 98.2 F (36.8 C)   Resp 18   Ht 5' 5.5" (1.664 m)   Wt 66.5 kg   LMP 05/24/1970   SpO2 96%   BMI 24.03 kg/m  Physical Exam General: No acute distress. Awake and conversant. Eyes: Normal conjunctiva, anicteric. Round symmetric pupils. ENT: Hearing grossly intact. No nasal discharge. Neck: Neck is supple. No masses or thyromegaly. Respiratory: Respirations are non-labored. No wheezing. Skin: Warm. No rashes or ulcers. Psych: Alert and oriented. Cooperative, Appropriate mood and affect, Normal judgment. CV: No lower extremity edema. MSK: Normal ambulation. No clubbing or cyanosis. Neuro: Sensation and CN II-XII grossly normal.   Pertinent Labs, Studies, and Procedures:    Latest Ref Rng & Units 07/12/2022    2:45 AM 07/11/2022    2:42 AM 07/10/2022    8:33 PM  CBC  WBC 4.0 - 10.5 K/uL 3.4  4.8  5.1   Hemoglobin 12.0 - 15.0 g/dL 10.6  11.7  12.6   Hematocrit 36.0 - 46.0 % 31.6  36.2  36.9   Platelets 150 - 400 K/uL 170  201  223        Latest Ref Rng & Units 07/12/2022    2:45 AM 07/11/2022    2:42 AM 07/10/2022    8:33 PM  CMP  Glucose 70 - 99 mg/dL 92  113  148   BUN 8 - 23 mg/dL 14  13  17   $ Creatinine 0.44 - 1.00 mg/dL 0.64  0.87  0.79   Sodium 135 - 145 mmol/L 140  137  136   Potassium 3.5 - 5.1 mmol/L 3.5  3.7  3.5   Chloride 98 - 111 mmol/L 108  107  101   CO2 22 - 32 mmol/L 23  23  23   $ Calcium 8.9 - 10.3 mg/dL 8.4  8.1  8.9   Total Protein 6.5 - 8.1 g/dL 5.2  6.0  6.8   Total Bilirubin 0.3 - 1.2 mg/dL 0.9  1.4  1.8   Alkaline Phos 38 - 126 U/L 143  156  202   AST 15 - 41 U/L 272  916  1,391   ALT 0 - 44 U/L 353  667  814     Recent Results (from the past 240 hour(s))  Blood culture (routine x 2)     Status: None (Preliminary result)    Collection Time: 07/10/22  8:33 PM   Specimen: BLOOD RIGHT FOREARM  Result Value Ref Range Status   Specimen Description BLOOD RIGHT FOREARM  Final   Special Requests   Final    BOTTLES DRAWN AEROBIC AND ANAEROBIC Blood Culture adequate volume   Culture   Final    NO GROWTH 2 DAYS Performed at [facility] [id number] Report Status PENDING  Incomplete  Resp panel by RT-PCR (RSV, Flu A&B, Covid) Anterior Nasal Swab     Status: None   Collection Time: 07/10/22  8:37 PM   Specimen: Anterior Nasal Swab  Result Value Ref Range Status   SARS Coronavirus 2 by RT PCR NEGATIVE NEGATIVE Final   Influenza A by PCR NEGATIVE NEGATIVE Final   Influenza B by PCR NEGATIVE NEGATIVE Final    Comment: (NOTE) The Xpert Xpress SARS-CoV-2/FLU/RSV plus assay is intended as an aid in the diagnosis of influenza from Nasopharyngeal swab specimens and should not be used as a sole basis for treatment. Nasal washings and aspirates are unacceptable for Xpert Xpress SARS-CoV-2/FLU/RSV testing.  Fact Sheet for Patients: EntrepreneurPulse.com.au  Fact Sheet for Healthcare Providers: IncredibleEmployment.be  This test is not yet approved or cleared by the [place] FDA and has been authorized for detection and/or diagnosis of SARS-CoV-2 by FDA under an Emergency Use Authorization (EUA). This EUA will remain in effect (meaning this test can be used) for the duration of the COVID-19 declaration under Section 564(b)(1) of the Act, 21 U.S.C. section 360bbb-3(b)(1), unless the authorization is terminated or revoked.     Resp Syncytial Virus by PCR NEGATIVE NEGATIVE Final    Comment: (NOTE) Fact Sheet for Patients: EntrepreneurPulse.com.au  Fact Sheet for Healthcare Providers: IncredibleEmployment.be  This test is not yet approved or cleared by the [place] FDA and has been authorized for  detection and/or diagnosis of SARS-CoV-2 by FDA under an Emergency Use Authorization (EUA). This EUA will remain in effect (meaning this test can be used) for the duration of the COVID-19 declaration under Section 564(b)(1) of the Act, 21 U.S.C. section 360bbb-3(b)(1), unless the authorization is terminated or revoked.  Performed at [facility], [place] [address], [place], [place] [address]   Respiratory (~20 pathogens) panel by PCR     Status: None   Collection Time: 07/10/22  8:37 PM   Specimen: Nasopharyngeal Swab; Respiratory  Result Value Ref Range Status   Adenovirus NOT DETECTED NOT DETECTED Final   Coronavirus 229E NOT DETECTED NOT DETECTED Final    Comment: (NOTE) The Coronavirus on the Respiratory Panel, DOES NOT test for the novel  Coronavirus (2019 nCoV)    Coronavirus HKU1 NOT DETECTED NOT DETECTED Final   Coronavirus NL63 NOT DETECTED NOT DETECTED Final   Coronavirus OC43 NOT DETECTED NOT DETECTED Final   Metapneumovirus NOT DETECTED NOT DETECTED Final   Rhinovirus / Enterovirus NOT DETECTED NOT DETECTED Final   Influenza A NOT DETECTED NOT DETECTED Final   Influenza B NOT DETECTED NOT DETECTED Final   Parainfluenza Virus 1 NOT DETECTED NOT DETECTED Final   Parainfluenza Virus 2 NOT DETECTED NOT DETECTED Final   Parainfluenza Virus 3 NOT DETECTED NOT DETECTED Final   Parainfluenza Virus 4 NOT DETECTED NOT DETECTED Final   Respiratory Syncytial Virus NOT DETECTED NOT DETECTED Final   Bordetella pertussis NOT DETECTED NOT DETECTED Final   Bordetella Parapertussis NOT DETECTED NOT DETECTED Final   Chlamydophila pneumoniae NOT DETECTED NOT DETECTED Final   Mycoplasma pneumoniae NOT DETECTED NOT DETECTED Final    Comment: Performed at [facility], [place]. [address] [place], [place] [address]  Blood culture (routine x 2)     Status: None (Preliminary result)   Collection Time: 07/10/22  8:43 PM   Specimen: BLOOD LEFT FOREARM  Result Value Ref Range Status    Specimen Description BLOOD LEFT FOREARM  Final   Special Requests   Final    BOTTLES DRAWN AEROBIC AND ANAEROBIC Blood Culture results may not be optimal due to an excessive volume of blood received in culture bottles   Culture   Final    NO  GROWTH 2 DAYS Performed at [facility], [place] [address], [place],  [address]    Report Status PENDING  Incomplete  CT CHEST ABDOMEN PELVIS W CONTRAST  Result Date: 07/11/2022 CLINICAL DATA:  Possible sepsis EXAM: CT CHEST, ABDOMEN, AND PELVIS WITH CONTRAST TECHNIQUE: Multidetector CT imaging of the chest, abdomen and pelvis was performed following the standard protocol during bolus administration of intravenous contrast. RADIATION DOSE REDUCTION: This exam was performed according to the departmental dose-optimization program which includes automated exposure control, adjustment of the mA and/or kV according to patient size and/or use of iterative reconstruction technique. CONTRAST:  78m OMNIPAQUE IOHEXOL 350 MG/ML SOLN COMPARISON:  Chest x-ray from earlier in the same day. FINDINGS: CT CHEST FINDINGS Cardiovascular: Atherosclerotic calcifications are noted without aneurysmal dilatation or dissection. Coronary calcifications are noted. No cardiac enlargement is seen. The pulmonary artery as visualized shows no evidence of pulmonary embolus although not timed for embolus evaluation. Mediastinum/Nodes: Thoracic inlet is within normal limits. No hilar or mediastinal adenopathy is noted. The esophagus is within normal limits. Lungs/Pleura: Subpleural fibrotic changes are noted. The basilar infiltrate seen on prior chest x-ray is not borne out on today's CT. No focal confluent infiltrate is seen. No sizable effusion is noted. Some nodularity along the superior aspect of the major fissure on the right is noted best seen on image number 41 of series 5. This is new from a prior CT from 2022 but likely represents a Peri fissural lymph node. No follow-up is  recommended. Musculoskeletal: Degenerative changes of the thoracic spine are noted. CT ABDOMEN PELVIS FINDINGS Hepatobiliary: Gallbladder is well distended although no cholelithiasis is seen. Dilatation of the common bile duct is noted to the level of the ampulla of Vater similar to that seen on the prior exam. No obstructing lesion is noted. This may represent the patient's steady-state. Correlate with laboratory values. Pancreas: Unremarkable. No pancreatic ductal dilatation or surrounding inflammatory changes. Spleen: Normal in size without focal abnormality. Adrenals/Urinary Tract: Adrenal glands are within normal limits. Kidneys demonstrate a normal enhancement pattern bilaterally. A upper pole cyst is noted on the left measuring 1.5 cm. This is simple in nature and no follow-up is recommended. This is stable from the prior exam. The bladder is partially distended. Stomach/Bowel: Diverticular change of the colon is noted without evidence of diverticulitis. Fecal material is noted scattered throughout the colon without obstructive change. The appendix is within normal limits. No obstructive or inflammatory changes of the small bowel stomach. Vascular/Lymphatic: Aortic atherosclerosis. No enlarged abdominal or pelvic lymph nodes. Reproductive: Status post hysterectomy. No adnexal masses. Other: No abdominal wall hernia or abnormality. No abdominopelvic ascites. Musculoskeletal: Degenerative changes of lumbar spine are noted. IMPRESSION: CT of the chest: No acute abnormality is noted in the chest. CT of the abdomen and pelvis: Distended gallbladder without evidence of cholelithiasis. Stable dilatation of the common bile duct is noted unchanged from 2022. This likely represents the patient's steady state. Diverticulosis without diverticulitis. No other focal abnormality is noted. Electronically Signed   By: MInez CatalinaM.D.   On: 07/11/2022 00:10   DG Chest Port 1 View  Result Date: 07/10/2022 CLINICAL DATA:   Fever EXAM: PORTABLE CHEST 1 VIEW COMPARISON:  09/14/2021 FINDINGS: Single frontal view of the chest demonstrates an unremarkable cardiac silhouette. There is chronic background scarring, greatest at the lung bases. Superimposed patchy consolidation at the left lung base as well as trace left pleural effusion consistent with bronchopneumonia. No pneumothorax. No acute bony abnormalities. IMPRESSION:  1. Patchy left basilar bronchopneumonia small left parapneumonic effusion. Electronically Signed   By: Randa Ngo M.D.   On: 07/10/2022 20:32    Discharge Instructions:  Discharge Instructions     Call MD for:  persistant nausea and vomiting   Complete by: As directed    Call MD for:  severe uncontrolled pain   Complete by: As directed    Call MD for:  temperature >100.4   Complete by: As directed    Diet - low sodium heart healthy   Complete by: As directed    Discharge instructions   Complete by: As directed    Dear Ms. Villacis,  On behalf of the Internal Medicine team, it has been a pleasure caring for you. You were brought to the hospital because you were experiencing chills, fever, nausea, and vomiting at home. When you came to the hospital you were found to have a fever. You were started on antibiotics and tested for any infection. Luckily, while you have been here, your fever and symptoms have improved! All of your laboratory tests were negative for any type of bacterial infection. We believe you may have had a viral infection that improved and resolved while you were hospitalized.   Please continue taking your home medications. It is important you stay hydrated and supplement your diet with boost shakes whenever needed.  There are no new medications added.  You have a follow up appoitnemt with our clinic this week, on Thursday 07/15/2021 at 9:45 AM.  You have an upcoming appointment with the Cardiologist, Dr. Lovena Le, on 08/31/2021 at 9:30 AM. You should arrive by 9:30 for this  appointment.  It has been a pleasure caring for you and we wish you a happy healthy life.  Sincerely,   Dr. Christene Slates, MD Internal Medicine   Increase activity slowly   Complete by: As directed        Signed: Christene Slates, MD 07/12/2022, 1:49 PM   Pager: [phone]

## 2022-07-12 NOTE — Telephone Encounter (Signed)
RTC to patient message was left that Clinics had called and she will need an appointment in the Clinics prior to getting the creme she requested.

## 2022-07-12 NOTE — Progress Notes (Signed)
Mobility Specialist Progress Note   07/12/22 1029  Mobility  Activity Ambulated independently in hallway  Level of Assistance Standby assist, set-up cues, supervision of patient - no hands on  Assistive Device None  Distance Ambulated (ft) 500 ft  Activity Response Tolerated well  Mobility Referral Yes  $Mobility charge 1 Mobility   Received pt in chair having no complaints and agreeable to mobility. Pt was asymptomatic throughout ambulation and returned to room w/o fault. Left back in chair w/ call bell in reach and all needs met.  Holland Falling Mobility Specialist Please contact via SecureChat or  Rehab office at [phone]

## 2022-07-12 NOTE — Progress Notes (Signed)
DISCHARGE NOTE HOME Odella [facility]as [place] to be discharged Home per MD order. Diagnosis, treatment, medications, and follow up appointments discussed with patient. Medication list explained in detail. Patient verbalized knowledge ans an understanding. Patient advised to return to ED if symptoms return. VSS. No complaints of pain.  Skin clean, dry and intact without evidence of skin break down, no evidence of skin tears noted. IV catheter discontinued intact. Site without signs and symptoms of complications. Dressing and pressure applied. Pt denies pain at the site currently. No complaints noted.  Patient free of lines, drains, and wounds.     Virgina Jock, RN

## 2022-07-12 NOTE — TOC Transition Note (Addendum)
Transition of Care [facility]) - CM/SW Discharge Note   Patient Details  Name: [name] [name] MRN: [id number] Date of Birth: [birth year]  Transition of Care [facility] [address]) CM/SW Contact:  Tom-Johnson, Renea Ee, RN Phone Number: 07/12/2022, 3:18 PM   Clinical Narrative:     Patient is scheduled for discharge today. Outpatient f/u and instructions on AVS. No PT/OT f/u noted. TOC needs or recommendations noted. Son to transport at discharge. No further TOC ned noted.         Final next level of care: Home/Self Care Barriers to Discharge: Barriers Resolved   Patient Goals and CMS Choice CMS Medicare.gov Compare Post Acute Care list provided to:: Patient Choice offered to / list presented to : NA  Discharge Placement                  Patient to be transferred to facility by: Son      Discharge Plan and Services Additional resources added to the After Visit Summary for                  DME Arranged: N/A DME Agency: NA       HH Arranged: NA HH Agency: NA        Social Determinants of Health (SDOH) Interventions SDOH Screenings   Food Insecurity: No Food Insecurity (07/11/2022)  Housing: Low Risk  (07/11/2022)  Transportation Needs: No Transportation Needs (07/11/2022)  Utilities: Not At Risk (07/11/2022)  Alcohol Screen: Low Risk  (12/07/2021)  Depression (PHQ2-9): Low Risk  (06/02/2022)  Financial Resource Strain: Low Risk  (12/07/2021)  Physical Activity: Insufficiently Active (12/07/2021)  Social Connections: Socially Isolated (12/07/2021)  Stress: No Stress Concern Present (12/07/2021)  Tobacco Use: Medium Risk (07/11/2022)     Readmission Risk Interventions     No data to display

## 2022-07-13 ENCOUNTER — Telehealth: Payer: Self-pay

## 2022-07-13 NOTE — Transitions of Care (Post Inpatient/ED Visit) (Signed)
   07/13/2022  Name: [name] [name] MRN: [id number] DOB: Jun 01, 1929  Today's TOC FU Call Status: Today's TOC FU Call Status:: Successful TOC FU Call Competed TOC FU Call Complete Date: 07/13/22  Transition Care Management Follow-up Telephone Call Date of Discharge: 07/12/22 Discharge Facility[facility]) Type of Discharge: Inpatient Admission Primary Inpatient Discharge Diagnosis:: Fever, unknown orgin How have you been since you were released from the hospital?: Better Any questions or concerns?: No  Items Reviewed: Did you receive and understand the discharge instructions provided?: Yes Medications obtained and verified?: Yes (Medications Reviewed) Any new allergies since your discharge?: No Dietary orders reviewed?: NA Do you have support at home?: Yes People in Home: child(ren), adult Name of Support/Comfort Primary Source: son- [employer] and Equipment/Supplies: [place] Ordered?: No Any new equipment or medical supplies ordered?: No  Functional Questionnaire: Do you need assistance with bathing/showering or dressing?: No Do you need assistance with meal preparation?: No Do you need assistance with eating?: No Do you have difficulty maintaining continence: No Do you need assistance with getting out of bed/getting out of a chair/moving?: No Do you have difficulty managing or taking your medications?: No  Folllow up appointments reviewed: PCP Follow-up appointment confirmed?: Yes Date of PCP follow-up appointment?: 07/15/22 Follow-up Provider: Dr. Stann Mainland Specialist [facility] Follow-up appointment confirmed?: NA Do you need transportation to your follow-up appointment?: No Do you understand care options if your condition(s) worsen?: Yes-patient verbalized understanding  SDOH Interventions Today    Flowsheet Row Most Recent Value  SDOH Interventions   Food Insecurity Interventions Intervention Not Indicated  Housing Interventions Intervention  Not Indicated      Johnney Killian, RN, BSN, CCM Care Management Coordinator [facility] Phone: [phone]: [phone]

## 2022-07-13 NOTE — Telephone Encounter (Signed)
Patient has an appointment scheduled for 07/15/2022 at 9:45 am.

## 2022-07-15 ENCOUNTER — Telehealth: Payer: Self-pay | Admitting: *Deleted

## 2022-07-15 ENCOUNTER — Ambulatory Visit (INDEPENDENT_AMBULATORY_CARE_PROVIDER_SITE_OTHER): Payer: Medicare Other

## 2022-07-15 VITALS — BP 143/71 | HR 72 | Temp 97.9°F | Ht 66.0 in | Wt 144.9 lb

## 2022-07-15 DIAGNOSIS — L989 Disorder of the skin and subcutaneous tissue, unspecified: Secondary | ICD-10-CM | POA: Diagnosis not present

## 2022-07-15 DIAGNOSIS — Z87891 Personal history of nicotine dependence: Secondary | ICD-10-CM | POA: Diagnosis not present

## 2022-07-15 DIAGNOSIS — K7200 Acute and subacute hepatic failure without coma: Secondary | ICD-10-CM | POA: Diagnosis not present

## 2022-07-15 DIAGNOSIS — M810 Age-related osteoporosis without current pathological fracture: Secondary | ICD-10-CM | POA: Diagnosis not present

## 2022-07-15 DIAGNOSIS — I10 Essential (primary) hypertension: Secondary | ICD-10-CM | POA: Diagnosis not present

## 2022-07-15 DIAGNOSIS — J301 Allergic rhinitis due to pollen: Secondary | ICD-10-CM | POA: Diagnosis not present

## 2022-07-15 LAB — CULTURE, BLOOD (ROUTINE X 2)
Culture: NO GROWTH
Culture: NO GROWTH
Special Requests: ADEQUATE

## 2022-07-15 MED ORDER — FEXOFENADINE HCL 180 MG PO TABS: 180.0000 mg | ORAL_TABLET | Freq: Every day | ORAL | 2 refills | Status: DC

## 2022-07-15 NOTE — Progress Notes (Deleted)
Tinnitus- refer to [facility]. Avoid caffeine and alcohol. Mental health screening.  HTN -metoprolol 25 mg daily  Presbycusis- hearing aids/ hearing amplification of right ear.  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    Patient has skin lesions on both arm. The one on her right arm has been present since at least last year. The one on her left arm has been present for several years. They have not chagned in size. Does report she had basal cell removed from her left arm and had some squamous cell spot on her face that she was treated for by dermatologist some years ago. She does report some poorly healing tender wounds on her bilateral ankles.   Small hyperkeratotic light brown patches with palpable elevation. Poorly healing small ulcers on bilateral ankles   Lesions are not consistent with basal cell, squamous, or melanocyte. Most consistent with benign seborrheic keratosis on both arms. These do not require biopsy or treatment unless they become bothersome. Furthermore, given lack of subcutaneous fat/tissue, I worry that biopsy would only cause damage to underlying structures. The non-healing lesions on her ankles do seem more consistent with squamous cell cancer for which we will refer her to dermatology for further evaluation.   HCM Flu covid pneumonia zoster

## 2022-07-15 NOTE — Telephone Encounter (Signed)
Called and spoke to the patient on the phone.Patient reports swelling in back of knee and feels like swelling in back of thigh also since hospital discharge. The pain is a dull ache and is constant. No trauma. Says it has been there before. Thought it would go away so didn't mention it at our visit earlier. Will avoid tylenol given recent ischemic hepatitis and cmp today not resulted. Can do ibuprofen 428m q6 hours. Told patient to call in the AM and schedule first available appointment as it is difficult to determine if this is a bakers cys, a muscle strain, DVT, varicose vein, etc.

## 2022-07-15 NOTE — Patient Instructions (Signed)
Thank you, Ms.Odella Aquas [place] for allowing [place] to provide your care today. Today we discussed :  Ischemic hepatitis: This was resolving in the hospital, we will get labs to make sure it is still doing okay.  Osteoporosis: I will measure your vitamin D level. We will likely start vitamin D and calcium supplementation.  Leg skin changes/lesions: Follow up with dermatology. Stop using the flurouracil.   I have ordered the following labs for you:   Lab Orders         Vitamin D (25 hydroxy)         CMP14 + Anion Gap        I have ordered the following medication/changed the following medications:   Stop the following medications: There are no discontinued medications.   Start the following medications: Meds ordered this encounter  Medications   fexofenadine (ALLEGRA) 180 MG tablet    Sig: Take 1 tablet (180 mg total) by mouth daily.    Dispense:  30 tablet    Refill:  2     Follow up: 6 months     We look forward to seeing you next time. Please call our clinic at [phone] if you have any questions or concerns. The best time to call is Monday-Friday from 9am-4pm, but there is someone available 24/7. If after hours or the weekend, call the main hospital number and ask for the Internal Medicine Resident On-Call. If you need medication refills, please notify your pharmacy one week in advance and they will send [place] a request.   Thank you for trusting me with your care. Wishing you the best!   Iona Coach, MD [place]

## 2022-07-15 NOTE — Telephone Encounter (Signed)
Patient called in sounding distressed c/o pain and swelling behind right knee that extends up right thigh. Rates 10/10 at present. Stated she has a hx of varicose veins in that area. States area is so painful she can't stand to have anything touch it such as chair seat. She is requesting something for pain be sent to [facility].

## 2022-07-16 LAB — CMP14 + ANION GAP
ALT: 221 IU/L — ABNORMAL HIGH (ref 0–32)
AST: 119 IU/L — ABNORMAL HIGH (ref 0–40)
Albumin/Globulin Ratio: 1.9 (ref 1.2–2.2)
Albumin: 4.6 g/dL (ref 3.6–4.6)
Alkaline Phosphatase: 291 IU/L — ABNORMAL HIGH (ref 44–121)
Anion Gap: 17 mmol/L (ref 10.0–18.0)
BUN/Creatinine Ratio: 20 (ref 12–28)
BUN: 12 mg/dL (ref 10–36)
Bilirubin Total: 0.7 mg/dL (ref 0.0–1.2)
CO2: 23 mmol/L (ref 20–29)
Calcium: 9.7 mg/dL (ref 8.7–10.3)
Chloride: 103 mmol/L (ref 96–106)
Creatinine, Ser: 0.61 mg/dL (ref 0.57–1.00)
Globulin, Total: 2.4 g/dL (ref 1.5–4.5)
Glucose: 94 mg/dL (ref 70–99)
Potassium: 4.3 mmol/L (ref 3.5–5.2)
Sodium: 143 mmol/L (ref 134–144)
Total Protein: 7 g/dL (ref 6.0–8.5)
eGFR: 85 mL/min/{1.73_m2} (ref >59)

## 2022-07-16 LAB — VITAMIN D 25 HYDROXY (VIT D DEFICIENCY, FRACTURES): Vit D, 25-Hydroxy: 30.2 ng/mL (ref 30.0–100.0)

## 2022-07-18 NOTE — Progress Notes (Signed)
Pneumonia ruled out

## 2022-07-19 ENCOUNTER — Other Ambulatory Visit: Payer: Self-pay

## 2022-07-19 DIAGNOSIS — J301 Allergic rhinitis due to pollen: Secondary | ICD-10-CM

## 2022-07-19 DIAGNOSIS — K7200 Acute and subacute hepatic failure without coma: Secondary | ICD-10-CM | POA: Insufficient documentation

## 2022-07-19 NOTE — Assessment & Plan Note (Signed)
Recently seen in clinic for bilateral ankle lesions c/f squamous cell carcinoma. Patient using old fluorouracil prescription. Discussed that she should no longer use this. Will follow up with dermatology in March.

## 2022-07-19 NOTE — Assessment & Plan Note (Signed)
Patient recently discharged 2/19 for ischemic hepatitis, here for hospital follow up. Says her N/V has resolved, getting up and walking around well without any lightheadedness, dizziness or weakness. Still not eating a ton of solid food but does eat 4-5 ensure drinks daily. AST and ALT continuing to downtrend now at 119 and 221 from 272 and 353 at discharge.

## 2022-07-19 NOTE — Telephone Encounter (Signed)
Incoming fax from pharmacy  Message to prescriber: Your patient has reported allergy to certrizine hcl please review and evaluate if fexofenadine hcl tabs-otc would be appropriate to dispense.

## 2022-07-19 NOTE — Assessment & Plan Note (Signed)
She had a DEXA 06/2011 with a T score of -2.7, due for repeat at this time. Says she never completed bisphosphonate therapy in the past. Does not wish to get a DEXA scan as she says she is 88 years old and would not complete bisphosphonate's at this time either. Vitamin D level was 30 at this visit. Should supplement diet with 800u vitamin D and 500-'100mg'$  calcium daily. -vitamin D 800u -calcium [phone] mg daily

## 2022-07-19 NOTE — Addendum Note (Signed)
Addended by: Jodean Lima on: 07/19/2022 10:59 AM   Modules accepted: Level of Service

## 2022-07-19 NOTE — Progress Notes (Signed)
Called patient and discussed lab results. Ischemic hepatitis continuing to improve, no change in management at this time. Vitamin D low normal with a history of osteoporosis. Recommended starting 800u Vitamin D daily and 500 to 600 mg calcium daily.

## 2022-07-19 NOTE — Progress Notes (Signed)
Internal Medicine Clinic Attending  Case discussed with Dr. Stann Mainland  At the time of the visit.  We reviewed the resident's history and exam and pertinent patient test results.  I agree with the assessment, diagnosis, and plan of care documented in the resident's note.

## 2022-07-19 NOTE — Assessment & Plan Note (Signed)
BP today 143/71. Patient reports not taking metoprolol. Has had trouble with BP meds in past due to labile pressures. Reports normal pressures at home. Recently admitted for ischemic hepatitis secondary to low pressures. Will not use medications for BP control at this time.

## 2022-07-19 NOTE — Progress Notes (Signed)
Established Patient Office Visit  Subjective   Patient ID: [name] [name], female    DOB: 1929-06-28  Age: 89 y.o. MRN: [id number]  Chief Complaint  Patient presents with   Hospitalization Follow-up    [name] [name] is a 89 y/o female with a pmh outlined below. Please see A&P for HPI.      Review of Systems  All other systems reviewed and are negative.     Objective:     BP (!) 143/71 (BP Location: Left Arm, Patient Position: Sitting, Cuff Size: Small)   Pulse 72   Temp 97.9 F (36.6 C) (Oral)   Ht '5\' 6"'$  (1.676 m)   Wt 144 lb 14.4 oz (65.7 kg)   LMP 05/24/1970   SpO2 100%   BMI 23.39 kg/m    Physical Exam Constitutional:      General: She is not in acute distress.    Appearance: Normal appearance. She is normal weight.  Eyes:     General: No scleral icterus.    Extraocular Movements: Extraocular movements intact.     Conjunctiva/sclera: Conjunctivae normal.  Cardiovascular:     Rate and Rhythm: Normal rate and regular rhythm.     Pulses: Normal pulses.     Heart sounds: Normal heart sounds. No murmur heard.    No gallop.  Pulmonary:     Effort: Pulmonary effort is normal. No respiratory distress.     Breath sounds: Normal breath sounds. No wheezing or rales.  Musculoskeletal:     Right lower leg: No edema.     Left lower leg: No edema.  Skin:    General: Skin is warm and dry.     Capillary Refill: Capillary refill takes less than 2 seconds.     Comments: Bilateral healing scabbed wounds above the medial malleolus  Neurological:     Mental Status: She is alert.      Results for orders placed or performed in visit on 07/15/22  Vitamin D (25 hydroxy)  Result Value Ref Range   Vit D, 25-Hydroxy 30.2 30.0 - 100.0 ng/mL  CMP14 + Anion Gap  Result Value Ref Range   Glucose 94 70 - 99 mg/dL   BUN 12 10 - 36 mg/dL   Creatinine, Ser 0.61 0.57 - 1.00 mg/dL   eGFR 85 >59 mL/min/1.73   BUN/Creatinine Ratio 20 12 - 28   Sodium 143 134 - 144 mmol/L   Potassium  4.3 3.5 - 5.2 mmol/L   Chloride 103 96 - 106 mmol/L   CO2 23 20 - 29 mmol/L   Anion Gap 17.0 10.0 - 18.0 mmol/L   Calcium 9.7 8.7 - 10.3 mg/dL   Total Protein 7.0 6.0 - 8.5 g/dL   Albumin 4.6 3.6 - 4.6 g/dL   Globulin, Total 2.4 1.5 - 4.5 g/dL   Albumin/Globulin Ratio 1.9 1.2 - 2.2   Bilirubin Total 0.7 0.0 - 1.2 mg/dL   Alkaline Phosphatase 291 (H) 44 - 121 IU/L   AST 119 (H) 0 - 40 IU/L   ALT 221 (H) 0 - 32 IU/L      The ASCVD Risk score (Arnett DK, et al., 2019) failed to calculate for the following reasons:   The 2019 ASCVD risk score is only valid for ages 73 to 45    Assessment & Plan:   Problem List Items Addressed This Visit       Cardiovascular and Mediastinum   Essential hypertension (Chronic)    BP today 143/71. Patient reports not  taking metoprolol. Has had trouble with BP meds in past due to labile pressures. Reports normal pressures at home. Recently admitted for ischemic hepatitis secondary to low pressures. Will not use medications for BP control at this time.        Respiratory   Allergic rhinitis   Relevant Medications   fexofenadine (ALLEGRA) 180 MG tablet     Digestive   Ischemic hepatitis    Patient recently discharged 2/19 for ischemic hepatitis, here for hospital follow up. Says her N/V has resolved, getting up and walking around well without any lightheadedness, dizziness or weakness. Still not eating a ton of solid food but does eat 4-5 ensure drinks daily. AST and ALT continuing to downtrend now at 119 and 221 from 272 and 353 at discharge.      Relevant Orders   CMP14 + Anion Gap (Completed)     Musculoskeletal and Integument   Osteoporosis - Primary    She had a DEXA 06/2011 with a T score of -2.7, due for repeat at this time. Says she never completed bisphosphonate therapy in the past. Does not wish to get a DEXA scan as she says she is 89 years old and would not complete bisphosphonate's at this time either. Vitamin D level was 30 at this  visit. Should supplement diet with 800u vitamin D and 500-'100mg'$  calcium daily. -vitamin D 800u -calcium [phone] mg daily      Relevant Orders   Vitamin D (25 hydroxy) (Completed)   Skin lesion    Recently seen in clinic for bilateral ankle lesions c/f squamous cell carcinoma. Patient using old fluorouracil prescription. Discussed that she should no longer use this. Will follow up with dermatology in March.       No follow-ups on file.    Iona Coach, MD

## 2022-07-21 MED ORDER — FEXOFENADINE HCL 180 MG PO TABS: 180.0000 mg | ORAL_TABLET | Freq: Every day | ORAL | 2 refills | Status: DC

## 2022-07-29 ENCOUNTER — Telehealth: Payer: Self-pay

## 2022-07-29 DIAGNOSIS — J301 Allergic rhinitis due to pollen: Secondary | ICD-10-CM

## 2022-07-29 MED ORDER — FEXOFENADINE HCL 30 MG/5ML PO SUSP: 30.0000 mg | Freq: Every day | ORAL | 12 refills | Status: DC

## 2022-07-29 NOTE — Telephone Encounter (Signed)
Return pt's call who stated she cannot swallow large tablets. Requesting Fexofenadine '180mg'$  liquid rx - send to [facility]. She stated Allegra liquid is for children, not strong enough. THANKS

## 2022-07-29 NOTE — Telephone Encounter (Signed)
Pt was called and informed of refill. Stated she received a call from [facility]; not in stock but it's on order.

## 2022-07-29 NOTE — Telephone Encounter (Signed)
Pt states she need fexofenadine (ALLEGRA) 180 MG tablet to be liquid, and not tablet. Pt is using [facility] on [address].

## 2022-07-31 ENCOUNTER — Ambulatory Visit (HOSPITAL_COMMUNITY)
Admission: EM | Admit: 2022-07-31 | Discharge: 2022-07-31 | Disposition: A | Discharge status: Home / Self Care | Payer: Medicare Other | Attending: Emergency Medicine | Admitting: Emergency Medicine

## 2022-07-31 ENCOUNTER — Encounter (HOSPITAL_COMMUNITY): Payer: Self-pay

## 2022-07-31 DIAGNOSIS — N368 Other specified disorders of urethra: Secondary | ICD-10-CM

## 2022-07-31 LAB — POCT URINALYSIS DIPSTICK, ED / UC
Bilirubin Urine: NEGATIVE
Glucose, UA: NEGATIVE mg/dL
Hgb urine dipstick: NEGATIVE
Ketones, ur: NEGATIVE mg/dL
Leukocytes,Ua: NEGATIVE
Nitrite: NEGATIVE
Protein, ur: NEGATIVE mg/dL
Specific Gravity, Urine: 1.01 (ref 1.005–1.030)
Urobilinogen, UA: 0.2 mg/dL (ref 0.0–1.0)
pH: 8.5 — ABNORMAL HIGH (ref 5.0–8.0)

## 2022-07-31 MED ORDER — HYDROCORTISONE 1 % EX CREA: TOPICAL_CREAM | CUTANEOUS | 0 refills | Status: DC

## 2022-07-31 MED ORDER — IBUPROFEN 100 MG/5ML PO SUSP: 200.0000 mg | Freq: Three times a day (TID) | ORAL | 0 refills | Status: DC

## 2022-07-31 NOTE — ED Triage Notes (Signed)
Patient here today for frequent urination and burning X 1 week. She had some Nystatin and Triamcinolone ointment from 10 years ago and has been applying that vaginally which helped some. Rash underneath breast X 1 day. She went to the hospital on 07/10/2022 and had a catheter placed.

## 2022-07-31 NOTE — Discharge Instructions (Addendum)
Take the ibuprofen 3x daily (every 6 hours) for the next 4-5 days. This is an anti-inflammatory medicine that should help with pain.  Apply cool compress to the area for 10-15 minutes at a time, several times daily.  Please call the urology clinic first thing Monday morning to make an appointment for follow up.

## 2022-07-31 NOTE — ED Provider Notes (Signed)
[place]    CSN: [id number] Arrival date & time: 07/31/22  1219     History   Chief Complaint Chief Complaint  Patient presents with   Urinary Frequency    HPI [name] [name] is a 89 y.o. female.  1 week history of urethral pain. Started a few days after she had a catheter on 2/17. She feels the pain is "inside". Not worse with urination. No bleeding or abrasion.  Denies hematuria, fever, flank pain She has applied 89 year old nystatin and triamcinolone to the vaginal area to relieve symptoms No history of this  Past Medical History:  Diagnosis Date   Anemia    iron def   Anxiety    Arrhythmia    paroxysmal SVT and symtomatic PAC's   ATRIAL PREMATURE BEATS 03/20/2009       Lovena Le, MD, Leonidas Romberg, Binnie Kand      Cerumen impaction 08/27/2021   Common bile duct dilation 04/02/2021   Diverticulosis    colon   Dysuria 03/07/2019   Elevated troponin    Gastritis and gastroduodenitis    GERD 04/02/2008   Qualifier: Diagnosis of  By: Jenny Reichmann MD, Hunt Oris    History of colonic polyps    cecal   Hypertension    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   Venous insufficiency of both lower extremities 06/04/2019   Hyperlipidemia 06/04/2019   History of repair of rectocele 03/13/2019    Arthritis of knee 03/25/2016   Presbycusis of both ears 01/15/2016   History of TIA (transient ischemic attack) 07/19/2011   PAC (premature atrial contraction) 03/20/2009   ANEMIA-IRON DEFICIENCY 04/02/2008   Anxiety 04/02/2008   Essential hypertension 04/02/2008   Osteoporosis 04/02/2008    Past Surgical History:  Procedure Laterality Date   A&P, enterocele repair     s/p 2003   ABDOMINAL HYSTERECTOMY     BIOPSY  02/05/2020   Procedure: BIOPSY;  Surgeon: Thornton Park, MD;  Location: [facility];  Service: Gastroenterology;;   BREAST REDUCTION SURGERY     s/p 1996      PATIENT DENIES BREAST REDUCTION    ESOPHAGEAL DILATION  02/05/2020   Procedure: ESOPHAGEAL DILATION;  Surgeon: Thornton Park, MD;  Location: [facility];  Service: Gastroenterology;;   ESOPHAGEAL MANOMETRY N/A 05/07/2020   Procedure: ESOPHAGEAL MANOMETRY (EM);  Surgeon: Milus Banister, MD;  Location: [facility];  Service: Endoscopy;  Laterality: N/A;   ESOPHAGOGASTRODUODENOSCOPY (EGD) WITH PROPOFOL N/A 02/05/2020   Procedure: ESOPHAGOGASTRODUODENOSCOPY (EGD) WITH PROPOFOL;  Surgeon: Thornton Park, MD;  Location: [facility];  Service: Gastroenterology;  Laterality: N/A;   HEMORRHOID SURGERY     LEFT HEART CATH AND CORONARY ANGIOGRAPHY N/A 05/25/2017   Procedure: LEFT HEART CATH AND CORONARY ANGIOGRAPHY;  Surgeon: Larae Grooms  S, MD;  Location: [facility];  Service: Cardiovascular;  Laterality: N/A;    OB History     Gravida  2   Para  2   Term      Preterm      AB      Living  1      SAB      IAB      Ectopic      Multiple      Live Births               Home Medications    Prior to Admission medications   Medication Sig Start Date End Date Taking? Authorizing Provider  AMBULATORY NON FORMULARY MEDICATION Peppermint altoids 1-2 tabs as needed   Yes [provider]  busPIRone (BUSPAR) 5 MG tablet TAKE 1 TABLET TWICE A DAY Patient taking  differently: Take 5 mg by mouth daily. 12/09/21  Yes Masters, Katie, DO  Ensure (ENSURE) Take 237 mLs by mouth 4 (four) times daily.   Yes [provider]  fexofenadine (ALLEGRA) 30 MG/5ML suspension Take 5 mLs (30 mg total) by mouth daily. 07/29/22  Yes Iona Coach, MD  flecainide (TAMBOCOR) 50 MG tablet TAKE 2 TABLETS EVERY MORNING AND 1 TABLET AT NIGHT Patient taking differently: Take 50-100 mg by mouth See admin instructions. Take 2 tablets by mouth in the morning and 1 tablet at night 09/22/21  Yes Evans Lance, MD  hydrocortisone cream 1 % Apply to rectal area 2 times daily. Do not use on urethra or vagina 07/31/22  Yes Darica Goren, Wells Guiles, PA-C  hyoscyamine (LEVSIN) 0.125 MG tablet Take 0.125 mg by mouth daily as needed (for esophogeal spasms).   Yes [provider]  ibuprofen (ADVIL) 100 MG/5ML suspension Take 10 mLs (200 mg total) by mouth 3 (three) times daily. 07/31/22  Yes Giada Schoppe, Wells Guiles, PA-C    Family History Family History  Problem Relation Age of Onset   Stroke Father        @ 6   Hypertension Father    Hypertension Sister    Cancer Sister 24       brain   Breast cancer Other 8       breast   Breast cancer Daughter 34       spread to her brain   Cancer Other 48       colon/MELANOMA   Stroke Mother        medication induced   Kidney disease Brother    Cancer Brother     Social History Social History   Tobacco Use   Smoking status: Former    Types: Cigarettes    Quit date: 05/24/1968    Years since quitting: 54.2   Smokeless tobacco: Never   Tobacco comments:    only smoked 7 years  Vaping Use   Vaping Use: Never used  Substance Use Topics   Alcohol use: No    Alcohol/week: 0.0 standard drinks of alcohol   Drug use: No     Allergies   Other, Pantoprazole, Zyrtec [cetirizine], Ciprofloxacin, and Nifedipine   Review of Systems Review of Systems As per HPI  Physical Exam Triage Vital Signs ED Triage Vitals  Enc Vitals Group     BP       Pulse      Resp      Temp      Temp src      SpO2      Weight  Height      Head Circumference      Peak Flow      Pain Score      Pain Loc      Pain Edu?      Excl. in [place]?    No data found.  Updated Vital Signs BP (!) 156/83 (BP Location: Left Arm)   Pulse 90   Temp 97.9 F (36.6 C) (Oral)   Resp 16   Ht '5\' 5"'$  (1.651 m)   Wt 139 lb (63 kg)   LMP 05/24/1970   SpO2 96%   BMI 23.13 kg/m    Physical Exam Vitals and nursing note reviewed.  Constitutional:      General: She is not in acute distress. HENT:     Mouth/Throat:     Pharynx: Oropharynx is clear.  Cardiovascular:     Rate and Rhythm: Normal rate and regular rhythm.  Pulmonary:     Effort: Pulmonary effort is normal.  Abdominal:     Palpations: Abdomen is soft.     Tenderness: There is no abdominal tenderness. There is no right CVA tenderness, left CVA tenderness or guarding.  Neurological:     Mental Status: She is alert and oriented to person, place, and time.     UC Treatments / Results  Labs (all labs ordered are listed, but only abnormal results are displayed) Labs Reviewed  POCT URINALYSIS DIPSTICK, ED / UC - Abnormal; Notable for the following components:      Result Value   pH 8.5 (*)    All other components within normal limits    EKG  Radiology No results found.  Procedures Procedures (including critical care time)  Medications Ordered in UC Medications - No data to display  Initial Impression / Assessment and Plan / UC Course  I have reviewed the triage vital signs and the nursing notes.  Pertinent labs & imaging results that were available during my care of the patient were reviewed by me and considered in my medical decision making (see chart for details).  UA is negative. No sign of infection today. Seems to be urethral irritation post-catheter. Without infection discussed we will treat pain and have her follow closely with urology. She has great kidney function.  Will try 200 mg ibuprofen 3x daily for the next 4-5 days. Cool compress to the urethra. She will call urology first thing Monday morning for follow up Discussed return precautions. Advised to NOT use any more triamcinolone cream on the vagina   At end of visit after patient was discharged, she reports she needs some cream for her hemorrhoids. Sent preparation H, advised to use only at the rectum. Stressed importance of not using any steroid creams in the vaginal area.  Final Clinical Impressions(s) / UC Diagnoses   Final diagnoses:  Urethral irritation     Discharge Instructions      Take the ibuprofen 3x daily (every 6 hours) for the next 4-5 days. This is an anti-inflammatory medicine that should help with pain.  Apply cool compress to the area for 10-15 minutes at a time, several times daily.  Please call the urology clinic first thing Monday morning to make an appointment for follow up.     ED Prescriptions     Medication Sig Dispense Auth. Provider   ibuprofen (ADVIL) 100 MG/5ML suspension Take 10 mLs (200 mg total) by mouth 3 (three) times daily. 150 mL Macklin Jacquin, PA-C   hydrocortisone cream 1 % Apply to rectal  area 2 times daily. Do not use on urethra or vagina 15 g Adhrit Krenz, Wells Guiles, PA-C      PDMP not reviewed this encounter.   Kaylah Chiasson, Wells Guiles, PA-C 07/31/22 1440

## 2022-08-02 ENCOUNTER — Telehealth: Payer: Self-pay

## 2022-08-02 NOTE — Telephone Encounter (Signed)
Pt requesting a call back. Pt reporting she went to the ED on yesterday and is having a lot of vaginal burning and swelling.  Pt was instructed to call [facility] this Morning to schedule a follow up.  Pt has been given an appt for 08/03/2022 tomorrow for [facility] at her request.  Pt is requesting what to do until she is seen because she is really hurting and can't get through to sch an appt with [facility].  Please call the patient back.

## 2022-08-02 NOTE — Telephone Encounter (Addendum)
RTC to patient having a lot of burning in the Vaginal area as well as swelling on 1 side of the labia.  Very uncomfortable.  Advised not to use any soap.  Try to keep as dry as possible.  Has been using bottled water and patting clean for relief. Advised not to put any cremes in area as well.  Has an appointment with Dr. Earnest Bailey for 08/03/2022 at 9:45 AM..  Patient has also been scheduled with [facility] for 08/18/2022 at 9:45 AM.

## 2022-08-03 ENCOUNTER — Encounter: Payer: Self-pay | Admitting: Student

## 2022-08-03 ENCOUNTER — Other Ambulatory Visit: Payer: Self-pay

## 2022-08-03 ENCOUNTER — Ambulatory Visit (INDEPENDENT_AMBULATORY_CARE_PROVIDER_SITE_OTHER): Payer: Medicare Other | Admitting: Student

## 2022-08-03 VITALS — BP 162/68 | HR 71 | Temp 97.8°F

## 2022-08-03 DIAGNOSIS — N342 Other urethritis: Secondary | ICD-10-CM | POA: Diagnosis not present

## 2022-08-03 DIAGNOSIS — R300 Dysuria: Secondary | ICD-10-CM

## 2022-08-03 NOTE — Patient Instructions (Signed)
Thank you, [name] for allowing [place] to provide your care today. Today we discussed .  Burning with urination I believe you have urethritis, which is inflammation of the tract that goes into you bladder. We will repeat your urine studies to make sure there is not infection present. Otherwise I would avoid any vaginal creams and take ibuprofen 400 mg every 6-8 hours. Please follow up with your urologist, unless this resolves completely before the appointment. IF you notice new symptoms, please call us back and we will re-evaluate you    I have ordered the following labs for you:   Lab Orders         Urinalysis, Reflex Microscopic       Referrals ordered today:   Referral Orders  No referral(s) requested today     I have ordered the following medication/changed the following medications:   Stop the following medications: There are no discontinued medications.   Start the following medications: No orders of the defined types were placed in this encounter.    Follow up: As needed    Should you have any questions or concerns please call the internal medicine clinic at [phone].    [name] [name], D.O. [place]

## 2022-08-03 NOTE — Progress Notes (Signed)
CC: pelvic burning  HPI:  [name] [name] is a 89 y.o. female living with a history stated below and presents today for pelvic concern. Please see problem based assessment and plan for additional details.  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     fexofenadine (ALLEGRA) 30 MG/5ML suspension Take 5 mLs (30 mg total) by mouth daily. 240 mL 12   flecainide (TAMBOCOR) 50 MG tablet TAKE 2 TABLETS EVERY MORNING AND 1 TABLET AT NIGHT (Patient taking differently: Take 50-100 mg by mouth See admin instructions. Take 2 tablets by mouth in the morning and 1 tablet at night) 270 tablet 3   hydrocortisone cream 1 % Apply to rectal area 2 times daily. Do not use on urethra or vagina 15 g  0   hyoscyamine (LEVSIN) 0.125 MG tablet Take 0.125 mg by mouth daily as needed (for esophogeal spasms).     ibuprofen (ADVIL) 100 MG/5ML suspension Take 10 mLs (200 mg total) by mouth 3 (three) times daily. 150 mL 0   No current facility-administered medications on file prior to visit.    Family History  Problem Relation Age of Onset   Stroke Father        @ 85   Hypertension Father    Hypertension Sister    Cancer Sister 1       brain   Breast cancer Other 61       breast   Breast cancer Daughter 44       spread to her brain   Cancer Other 10       colon/MELANOMA   Stroke Mother        medication induced   Kidney disease Brother    Cancer Brother     Social History   Socioeconomic History   Marital status: Widowed    Spouse name: Not on file   Number of children: 2   Years of education: Not on file   Highest education level: Not on file  Occupational History   Occupation:  Retired  Tobacco Use   Smoking status: Former    Types: Cigarettes    Quit date: 05/24/1968    Years since quitting: 54.2   Smokeless tobacco: Never   Tobacco comments:    only smoked 7 years  Vaping Use   Vaping Use: Never used  Substance and Sexual Activity   Alcohol use: No    Alcohol/week: 0.0 standard drinks of alcohol   Drug use: No   Sexual activity: Never    Comment: 1st intercourse 53 yo-1 partner  Other Topics Concern   Not on file  Social History Narrative   Not on file   Social Determinants of Health   Financial Resource Strain: Low Risk  (12/07/2021)   Overall Financial Resource Strain (CARDIA)    Difficulty of Paying Living Expenses: Not hard at all  Food Insecurity: No Food Insecurity (07/13/2022)   Hunger Vital Sign    Worried About Running Out of Food in the Last Year: Never true    Ran Out of Food in the Last Year: Never true  Transportation Needs: No Transportation Needs (07/11/2022)   PRAPARE - Hydrologist (Medical): No    Lack of  Transportation (Non-Medical): No  Physical Activity: Insufficiently Active (12/07/2021)   Exercise Vital Sign    Days of Exercise per Week: 7 days    Minutes of Exercise per Session: 10 min  Stress: No Stress Concern Present (12/07/2021)   [place]    Feeling of Stress : Not at all  Social Connections: Socially Isolated (12/07/2021)   Social Connection and Isolation Panel [NHANES]    Frequency of Communication with Friends and Family: Once a week    Frequency of Social Gatherings with Friends and Family: Once a week    Attends Religious Services: Never    Marine scientist or Organizations: No    Attends Archivist Meetings: Never    Marital Status: Widowed  Intimate Partner Violence: Not At Risk (07/11/2022)   Humiliation, Afraid, Rape, and Kick questionnaire    Fear of Current or Ex-Partner: No    Emotionally Abused: No    Physically Abused: No    Sexually Abused: No    Review of Systems: ROS negative except for what is noted on the assessment and plan.  Vitals:   08/03/22 0944 08/03/22 1029  BP: (!) 153/58 (!) 162/68  Pulse: 71   Temp: 97.8 F (36.6 C)   TempSrc: Oral   SpO2: 100%    Physical Exam: Constitutional: elderly appearing HENT: normocephalic atraumatic Eyes: conjunctiva non-erythematous Neck: supple Cardiovascular: regular rate and rhythm, no m/r/g Pulmonary/Chest: normal work of breathing on room air MSK: normal bulk and tone GU: Erythema around both the labia majora/minora with some erythema surrounding the urethral opening. No evidence of discharge, masses, fluctuations.  Neurological: alert Skin: warm and dry Psych: normal mood  RN Lauren present for vaginal examination  Assessment & Plan:   Urethritis Assessment: [name] presents with approximately one month of pelvic discomfort. She notes it started after an in/out catheterization during her hospitalization one  month ago. Over the past week she has had a burning pain with urination. When asked where the pain is located and she states inside her pelvic area. She had some relief with external application of triamcinolone and nystatin cream. It became so severe she presented to urgent care 5 days ago. Urinalysis at this visit was benign. She was  discharged home with follow up in our clinic and instructed to discontinue any creams, and prescribed liquid ibuprofen (she notes difficulty swallowing pills.)  She has yet to use the ibuprofen as she was nervous about side effects.The burning has improved for the past 3 days. She denies systemic symptoms of fevers, chills, suprapubic pain, vaginal discharge, gross hematuria. She denies similar symptoms in the past and notes it feels different than prior kidney infections.   On examination she has erythema around both the labia majora/minora with some erythema surrounding the urethral opening. No evidence of discharge, masses, fluctuations.   Suspect non-infectious urethritis and potential further irritation with nystatin/triamcinalone. I expect her symptoms will continue to improve and we discussed short course of ibuprofen. She has follow up scheduled with urology 03/28 if her symptoms persist. We will repeat a UA since it has been three days since her last.  Plan: - conservative management with ibuprofen 200 mg TID (63m) - follow up with urology if persistence - return to clinic if new symptoms occur - follow up UA  Patient discussed with Dr. VLaurena Slimmer D.O. [facility], PGY-3 Phone: [phone] 08/03/2022 Time 1:48 PM

## 2022-08-03 NOTE — Assessment & Plan Note (Addendum)
Assessment: [name] [name] presents with approximately one month of pelvic discomfort. She notes it started after an in/out catheterization during her hospitalization one month ago. Over the past week she has had a burning pain with urination. When asked where the pain is located and she states inside her pelvic area. She had some relief with external application of triamcinolone and nystatin cream. It became so severe she presented to urgent care 5 days ago. Urinalysis at this visit was benign. She was discharged home with follow up in our clinic and instructed to discontinue any creams, and prescribed liquid ibuprofen (she notes difficulty swallowing pills.)  She has yet to use the ibuprofen as she was nervous about side effects.The burning has improved for the past 3 days. She denies systemic symptoms of fevers, chills, suprapubic pain, vaginal discharge, gross hematuria. She denies similar symptoms in the past and notes it feels different than prior kidney infections.   On examination she has erythema around both the labia majora/minora with some erythema surrounding the urethral opening. No evidence of discharge, masses, fluctuations.   Suspect non-infectious urethritis and potential further irritation with nystatin/triamcinalone. I expect her symptoms will continue to improve and we discussed short course of ibuprofen. She has follow up scheduled with urology 03/28 if her symptoms persist. We will repeat a UA since it has been three days since her last.  Plan: - conservative management with ibuprofen 200 mg TID (10mL) - follow up with urology if persistence - return to clinic if new symptoms occur - follow up UA  Addendum - UA unremarkable. Continue to monitor symptoms

## 2022-08-04 ENCOUNTER — Telehealth: Payer: Self-pay | Admitting: Student

## 2022-08-04 DIAGNOSIS — R7401 Elevation of levels of liver transaminase levels: Secondary | ICD-10-CM

## 2022-08-04 LAB — URINALYSIS, ROUTINE W REFLEX MICROSCOPIC
Bilirubin, UA: NEGATIVE
Glucose, UA: NEGATIVE
Ketones, UA: NEGATIVE
Leukocytes,UA: NEGATIVE
Nitrite, UA: NEGATIVE
Protein,UA: NEGATIVE
RBC, UA: NEGATIVE
Specific Gravity, UA: 1.007 (ref 1.005–1.030)
Urobilinogen, Ur: 0.2 mg/dL (ref 0.2–1.0)
pH, UA: 7.5 (ref 5.0–7.5)

## 2022-08-04 NOTE — Telephone Encounter (Signed)
Called [name] [name] and discussed urinalysis results. She notes she has not taken the oral NSAID as she was told it may disturb her esophagus and the pharmacist she spoke with recommended she call us to discuss further.   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 We discussed trying a few days of oral NSAID and see how she tolerates it but she would rather avoid this completely. We will try tylenol. She is continuing to have improvement in her pelvic discomfort.

## 2022-08-05 NOTE — Progress Notes (Signed)
Internal Medicine Clinic Attending  Case discussed with Dr. Katsadouros  At the time of the visit.  We reviewed the resident's history and exam and pertinent patient test results.  I agree with the assessment, diagnosis, and plan of care documented in the resident's note.  

## 2022-08-06 NOTE — Telephone Encounter (Signed)
Patient called back stating she is still burning, and swollen. States, "I can't stand it, I can't take it no more," States she has tried sitting in warm water and hot water. Advised not sitting in hot water as this may make burning and swelling worse. Advised she try cool water instead. States she cannot take Tylenol 2/2 her liver and can't take ibuprofen 2/2 esophagus. States this has been going on for 10 days now. She believes these sx are 2/2 an antibiotic or another med she was given. She wants to know what she can do. States, "I can't go through another weekend like this."

## 2022-08-06 NOTE — Telephone Encounter (Signed)
Agree she should avoid tylenol, I have reviewed her prior notes and she had significant transaminitis. She will need a follow up appointment to repeat these labs since we are unsure if they have normalized.   She states her symptoms are continuing to improve. Encouraged her to take the NSAID for a few days to help with inflammation. She will call next week if symptoms begin to worsen. Again, UA without evidence for infectious etiology. She denies vaginal discharge or bleeding. No evidence of rash on my pelvic during her appointment

## 2022-08-06 NOTE — Addendum Note (Signed)
Addended by: Riesa Pope on: 08/06/2022 04:38 PM   Modules accepted: Orders

## 2022-08-12 NOTE — Telephone Encounter (Signed)
Will overbook and see her tomorrow. Thanks

## 2022-08-12 NOTE — Telephone Encounter (Signed)
Patient called she stated she is still having burning and  the medication she was prescribed is not working, patients son stated she has a appointment next week with her urologist and will discuss it then with them.

## 2022-08-13 ENCOUNTER — Ambulatory Visit (INDEPENDENT_AMBULATORY_CARE_PROVIDER_SITE_OTHER): Payer: Medicare Other | Admitting: Student

## 2022-08-13 ENCOUNTER — Encounter: Payer: Self-pay | Admitting: Student

## 2022-08-13 VITALS — BP 171/64 | HR 75 | Ht 65.0 in | Wt 141.4 lb

## 2022-08-13 DIAGNOSIS — K7200 Acute and subacute hepatic failure without coma: Secondary | ICD-10-CM | POA: Diagnosis not present

## 2022-08-13 DIAGNOSIS — N952 Postmenopausal atrophic vaginitis: Secondary | ICD-10-CM | POA: Diagnosis not present

## 2022-08-13 DIAGNOSIS — I10 Essential (primary) hypertension: Secondary | ICD-10-CM

## 2022-08-13 DIAGNOSIS — R7401 Elevation of levels of liver transaminase levels: Secondary | ICD-10-CM | POA: Diagnosis not present

## 2022-08-13 MED ORDER — REPLENS VAGINAL MOISTURIZER VA GEL: 1.0000 | Freq: Every morning | VAGINAL | 1 refills | Status: AC

## 2022-08-13 MED ORDER — ESTRADIOL 0.1 MG/GM VA CREA: 1.0000 g | TOPICAL_CREAM | Freq: Every day | VAGINAL | 12 refills | Status: DC

## 2022-08-13 MED ORDER — ESTRADIOL 0.1 MG/GM VA CREA: 1.0000 | TOPICAL_CREAM | Freq: Every day | VAGINAL | 12 refills | Status: DC

## 2022-08-13 NOTE — Patient Instructions (Addendum)
Thank you, Ms.Odella Aquas Kaplan for allowing [place] to provide your care today. Today we discussed  Use replens every morning  Use estrogen cream every evening  If you do not have relief from these, please follow up with the [facility]. If you do have relief and everything improves, you do not need to see them  I would cancel the appointment with the urologist   I have ordered the following labs for you:  Lab Orders  No laboratory test(s) ordered today      Referrals ordered today:   Referral Orders  No referral(s) requested today     I have ordered the following medication/changed the following medications:   Stop the following medications: There are no discontinued medications.   Start the following medications: No orders of the defined types were placed in this encounter.     Should you have any questions or concerns please call the internal medicine clinic at [phone].    Sanjuana Letters, D.O. [place]

## 2022-08-14 DIAGNOSIS — N952 Postmenopausal atrophic vaginitis: Secondary | ICD-10-CM | POA: Insufficient documentation

## 2022-08-14 LAB — HEPATIC FUNCTION PANEL
ALT: 14 IU/L (ref 0–32)
AST: 23 IU/L (ref 0–40)
Albumin: 4.7 g/dL — ABNORMAL HIGH (ref 3.6–4.6)
Alkaline Phosphatase: 102 IU/L (ref 44–121)
Bilirubin Total: 0.3 mg/dL (ref 0.0–1.2)
Bilirubin, Direct: 0.15 mg/dL (ref 0.00–0.40)
Total Protein: 7.5 g/dL (ref 6.0–8.5)

## 2022-08-14 NOTE — Assessment & Plan Note (Signed)
Assessment: Hx of significantly labile pressures and has not been prescribed anti-hypertensives. It was thought her elevated AST/ALT were secondary to ischemic hepatitis secondary to hypotension. She will need to return in one month to discuss blood pressures further and potential low dose treatment.  Plan: - continue to monitor BP's

## 2022-08-14 NOTE — Assessment & Plan Note (Signed)
Assessment: AST/ALT have resolved  Plan: - continue to monitor

## 2022-08-14 NOTE — Assessment & Plan Note (Signed)
Assessment: Patient presents for follow up after persistent vaginal burning. In short, she has been having vaginal burning that she describes as "inside" her vagina for the past month. Vaginal exam last week with enlarged and erythematous labia. Initially thought to be secondary to non-infectious urethritis with a normal UA and contact irritation from steroid and anti-fungal vaginal creams she applied and frequent washing of the vagina with soapy water.   For a week she kept the area clean and dry as well as a trial 4-5 days of ibuprofen. Her pain initially improved but over the last day or so returned. Today she endorses the pain being located on her labia when she urinates. Vaginal exam today consistent with erythematous and friable labia majora with regression of the labia minora. She has 3-4 72mm vesicular lesions. There is no further lesions, rashes, or discharge appreciated.   Likely component of atrophic vaginitis. Other differential includes lichen sclerosus. Will trial vaginal moisturizer in the morning and topical estrogen in the evening. Will place referral to [facility] and discussed with patient if no improvement in symptoms she should follow up with them. She denied a history of vesicular lesions so low suspicion that these are herpetic lesions but will continue to monitor.   Plan: - topical vaginal moisturizer and topical estrogen QHS - referral to OBGYN, instructed patient to schedule appointmetn if no improvement in symptoms - monitor for further vesicular lesions

## 2022-08-14 NOTE — Progress Notes (Signed)
CC: persistent vaginal pain  HPI:  [name] is a 89 y.o. female living with a history stated below and presents today for persistent vaginal pain. Please see problem based assessment and plan for additional details.  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   Skin cancer of arm 09/26/2014   Splenic artery aneurysm ([place]) 04/02/2021   Stricture esophagus    TMJ pain dysfunction syndrome 09/25/2018   Vitamin D deficiency    2010    Current Outpatient Medications on File Prior to Visit  Medication Sig Dispense Refill   AMBULATORY NON FORMULARY MEDICATION Peppermint altoids 1-2 tabs as needed     busPIRone (BUSPAR) 5 MG tablet TAKE 1 TABLET TWICE A DAY (Patient taking differently: Take 5 mg by mouth daily.) 180 tablet 3   Ensure (ENSURE) Take 237 mLs by mouth 4 (four) times daily.     fexofenadine (ALLEGRA) 30 MG/5ML suspension Take 5 mLs (30 mg total) by mouth daily. 240 mL 12   flecainide (TAMBOCOR) 50 MG tablet TAKE 2 TABLETS EVERY MORNING AND 1 TABLET AT NIGHT (Patient taking differently: Take 50-100 mg by mouth See admin instructions. Take 2 tablets by mouth in the morning and 1 tablet at night) 270 tablet 3   hydrocortisone cream 1 % Apply to rectal area 2 times daily. Do not use on  urethra or vagina 15 g 0   hyoscyamine (LEVSIN) 0.125 MG tablet Take 0.125 mg by mouth daily as needed (for esophogeal spasms).     ibuprofen (ADVIL) 100 MG/5ML suspension Take 10 mLs (200 mg total) by mouth 3 (three) times daily. 150 mL 0   No current facility-administered medications on file prior to visit.    Review of Systems: ROS negative except for what is noted on the assessment and plan.  Vitals:   08/13/22 1020  BP: (!) 171/64  Pulse: 75  SpO2: 98%  Weight: 141 lb 6.4 oz (64.1 kg)  Height: 5\' 5"  (1.651 m)    Physical Exam: Constitutional: well-appearing, in no acute distress HENT: normocephalic atraumatic Neck: supple Pulmonary/Chest: normal work of breathing on room air MSK: normal bulk and tone GU: erythematous and friable labia majora with regression of the labia minora. She has 3-4 81mm vesicular lesions. There is no further lesions, rashes, or discharge appreciated  Neurological: alert Psych: normal mood  Attending Dr. Jimmye Norman present for vaginal examination  Assessment & Plan:   Atrophic vaginitis Assessment: Patient presents for follow up after persistent vaginal burning. In short, she has been having vaginal burning that she describes as "inside" her vagina for the past month. Vaginal exam last week  with enlarged and erythematous labia. Initially thought to be secondary to non-infectious urethritis with a normal UA and contact irritation from steroid and anti-fungal vaginal creams she applied and frequent washing of the vagina with soapy water.   For a week she kept the area clean and dry as well as a trial 4-5 days of ibuprofen. Her pain initially improved but over the last day or so returned. Today she endorses the pain being located on her labia when she urinates. Vaginal exam today consistent with erythematous and friable labia majora with regression of the labia minora. She has 3-4 72mm vesicular lesions. There is no further lesions, rashes, or discharge  appreciated.   Likely component of atrophic vaginitis. Other differential includes lichen sclerosus. Will trial vaginal moisturizer in the morning and topical estrogen in the evening. Will place referral to [facility] and discussed with patient if no improvement in symptoms she should follow up with them. She denied a history of vesicular lesions so low suspicion that these are herpetic lesions but will continue to monitor.   Plan: - topical vaginal moisturizer and topical estrogen QHS - referral to OBGYN, instructed patient to schedule appointmetn if no improvement in symptoms - monitor for further vesicular lesions   Essential hypertension Assessment: Hx of significantly labile pressures and has not been prescribed anti-hypertensives. It was thought her elevated AST/ALT were secondary to ischemic hepatitis secondary to hypotension. She will need to return in one month to discuss blood pressures further and potential low dose treatment.  Plan: - continue to monitor BP's  Ischemic hepatitis Assessment: AST/ALT have resolved  Plan: - continue to monitor  Patient seen with Dr. Newell Coral, D.O. [facility], PGY-3 Phone: [phone] Date 08/14/2022 Time 10:48 AM

## 2022-08-19 NOTE — Progress Notes (Signed)
Internal Medicine Clinic Attending  I saw and evaluated the patient.  I personally confirmed the key portions of the history and exam documented by Dr. Johnney Ou and I reviewed pertinent patient test results.  The assessment, diagnosis, and plan were formulated together and I agree with the documentation in the resident's note.  Indeed Ms. Kilgour seems to have developed secondary irritant inflammation of the vulva, which exhibits atrophic tissue susceptible to aggressive cleansing, topicals, or even contact with urine (hence dysuria).  The tiny, non-clustered clear and painless vesicles on the vulva are unusual and while herpes was considered, we do not feel it is very likely.  I agree with the treatment plan outlined by Dr. Johnney Ou which includes referral to GYN pending response to treatment.

## 2022-08-23 ENCOUNTER — Ambulatory Visit (INDEPENDENT_AMBULATORY_CARE_PROVIDER_SITE_OTHER): Payer: Medicare Other | Admitting: Family Medicine

## 2022-08-23 ENCOUNTER — Other Ambulatory Visit: Payer: Self-pay

## 2022-08-23 ENCOUNTER — Encounter: Payer: Self-pay | Admitting: Family Medicine

## 2022-08-23 VITALS — BP 158/80 | HR 73 | Ht 65.0 in | Wt 141.0 lb

## 2022-08-23 DIAGNOSIS — N952 Postmenopausal atrophic vaginitis: Secondary | ICD-10-CM | POA: Diagnosis not present

## 2022-08-23 LAB — POCT URINALYSIS DIP (DEVICE)
Bilirubin Urine: NEGATIVE
Glucose, UA: NEGATIVE mg/dL
Hgb urine dipstick: NEGATIVE
Ketones, ur: NEGATIVE mg/dL
Leukocytes,Ua: NEGATIVE
Nitrite: NEGATIVE
Protein, ur: NEGATIVE mg/dL
Specific Gravity, Urine: 1.015 (ref 1.005–1.030)
Urobilinogen, UA: 0.2 mg/dL (ref 0.0–1.0)
pH: 7.5 (ref 5.0–8.0)

## 2022-08-23 NOTE — Progress Notes (Signed)
   GYNECOLOGY PROBLEM  VISIT ENCOUNTER NOTE  Subjective:   [name] [name] is a 89 y.o. G2P2 female here for a problem GYN visit.  Current complaints: Reports having vaginal irritation for the past 3 weeks.   Symptoms started after an ER visit. She reports having a catheter placed.   She reports since that time she has had burning and vulvar/vaginal irritation. She was given estrogen cream and replse. She reports the estrogen cream has helped. The replense was burning a little. She reports good relief from the estrogen cream. She has been putting in on the outside and trying to avoid where her urine exits. She had her uterus removed in her 61s. She reports prior 3 wks ago she did not have these symptoms.   Denies abnormal vaginal bleeding, discharge, pelvic pain, problems with intercourse or other gynecologic concerns.    Gynecologic History Patient's last menstrual period was 05/24/1970.  Contraception: status post hysterectomy  Health Maintenance Due  Topic Date Due   Zoster Vaccines- Shingrix (1 of 2) Never done   Pneumonia Vaccine 35+ Years old (1 of 1 - PCV) 01/22/1997   COVID-19 Vaccine (6 - 2023-24 season) 01/22/2022    The following portions of the patient's history were reviewed and updated as appropriate: allergies, current medications, past family history, past medical history, past social history, past surgical history and problem list.  Review of Systems Pertinent items are noted in HPI.   Objective:  BP (!) 158/80   Pulse 73   Ht 5\' 5"  (1.651 m)   Wt 141 lb (64 kg)   LMP 05/24/1970   BMI 23.46 kg/m  Gen: well appearing, NAD HEENT: no scleral icterus CV: RR Lung: Normal WOB Ext: warm well perfused  PELVIC: Erythematous labia majora, external genitalia; No white patches. Has a small white nodule at the introitus most consistent with a clogged oil gland. Atrophic appearing vaginal mucosa and cervix.  No abnormal discharge noted.  Pap smear obtained.  Normal uterine  size, no other palpable masses, no uterine or adnexal tenderness.   Assessment and Plan:   1. Atrophic vaginitis Sx improving with vaginal estrogen Recommended replense q3d Continue vaginal estrogen nightly with applicator Recommended use of crisco for barrier/lubrication of labia.  Udip negative, unlikely urinary source. Do not feel bx if warranted at this point and does not appear like lichen sclerosis or vulvar cancer F/u in 1 month to see if there is improvement   Please refer to After Visit Summary for other counseling recommendations.   Return in about 4 weeks (around 09/20/2022) for atrophic vaginitis with Dr Ernestina Patches .  Caren Macadam, MD, MPH, ABFM Attending [place] for [facility]

## 2022-08-23 NOTE — Patient Instructions (Addendum)
Use Replens every 3 days on labia(outside of vagina) Estradiol cream daily inside vagina-- you can use the applicator Crisco (blue can- shortening) place all over vagina at least two times per day but you can use after peeing after you have dried yourself.

## 2022-09-01 ENCOUNTER — Ambulatory Visit: Payer: Medicare Other | Admitting: Internal Medicine

## 2022-09-07 ENCOUNTER — Ambulatory Visit: Payer: Medicare Other | Admitting: Student

## 2022-09-17 ENCOUNTER — Other Ambulatory Visit: Payer: Self-pay | Admitting: Internal Medicine

## 2022-09-20 ENCOUNTER — Encounter: Payer: Medicare Other | Admitting: Family Medicine

## 2022-10-02 ENCOUNTER — Telehealth: Payer: Self-pay | Admitting: Student

## 2022-10-02 NOTE — Telephone Encounter (Signed)
Received page [name] [name] would like to speak to on-call provider. She has been having vaginal dryness that has manifested as dysuria over the last few months. During her most recent visit in April to the [facility], she was prescribed estrogen cream. Since that time she reports her symptoms have vastly improved. She states on Tuesday she began having extreme sharp pain that radiates to her back with urination. Further mentions this has occurred everyday since and she has been urinating more frequently. She reports increase in chills and "feeling more hot." She has eating and drinking well without difficulties, denies dizziness or lightheadedness.   Discussed with [name] [name] concern for potential urinary tract infection given her dysuria and polyuria in the setting of improving atrophic vaginitis treated with vaginal cream. I encouraged her to visit an urgent care to have her urine tested over the next day. Patient agrees with plan.  - Follow-up with urgent care - ED precautions given - Continue vaginal estrogen cream  Evlyn Kanner, MD Internal Medicine PGY-3 Pager: [phone]

## 2022-10-05 ENCOUNTER — Ambulatory Visit (INDEPENDENT_AMBULATORY_CARE_PROVIDER_SITE_OTHER): Payer: Medicare Other

## 2022-10-05 ENCOUNTER — Other Ambulatory Visit: Payer: Self-pay

## 2022-10-05 VITALS — BP 168/77 | HR 80 | Temp 98.6°F | Wt 140.1 lb

## 2022-10-05 DIAGNOSIS — R300 Dysuria: Secondary | ICD-10-CM | POA: Diagnosis not present

## 2022-10-05 LAB — URINALYSIS
Bilirubin, UA: NEGATIVE
Glucose, UA: NEGATIVE
Ketones, UA: NEGATIVE
Nitrite, UA: NEGATIVE
Specific Gravity, UA: 1.011 (ref 1.005–1.030)
Urobilinogen, Ur: 0.2 mg/dL (ref 0.2–1.0)
pH, UA: 8 — ABNORMAL HIGH (ref 5.0–7.5)

## 2022-10-05 LAB — POCT URINALYSIS DIP (DEVICE)
Bilirubin Urine: NEGATIVE
Glucose, UA: NEGATIVE mg/dL
Ketones, ur: NEGATIVE mg/dL
Nitrite: NEGATIVE
Protein, ur: NEGATIVE mg/dL
Specific Gravity, Urine: 1.02 (ref 1.005–1.030)
Urobilinogen, UA: 0.2 mg/dL (ref 0.0–1.0)
pH: 7.5 (ref 5.0–8.0)

## 2022-10-05 NOTE — Progress Notes (Signed)
Pt here today for burning with urination. She states she had these symptoms since Thursday along with mild back pain and shaking while on the toilet.  Has not taken any medications for these symptoms. Has had lost of appetite and chills on Saturday. No fever today. Pt states has hx of UTI. Pt gave urine sample.  UA in office showed moderate leuks and blood.    Reviewed UA results with Dr Vergie Living. Advised to have urine sent for UA and UC by [facility].  Will advise treatment plan once results are back. In the meantime, reviewed any worsening symptoms, plan to be seen at urgent care. Pt also advised to increase water intake along with cranberry juice and take ASA or Tylenol for discomfort as needed. Pt agreeable to plan of care and verbalized understanding.   Judeth Cornfield, RNC

## 2022-10-08 ENCOUNTER — Other Ambulatory Visit: Payer: Self-pay | Admitting: Obstetrics and Gynecology

## 2022-10-08 LAB — URINE CULTURE

## 2022-10-08 MED ORDER — CEFADROXIL 500 MG PO CAPS: 500.0000 mg | ORAL_CAPSULE | Freq: Two times a day (BID) | ORAL | 0 refills | Status: DC

## 2022-10-08 MED ORDER — FOSFOMYCIN TROMETHAMINE 3 G PO PACK: 3.0000 g | PACK | Freq: Once | ORAL | 0 refills | Status: AC

## 2022-12-14 ENCOUNTER — Encounter: Payer: Self-pay | Admitting: Internal Medicine

## 2022-12-14 ENCOUNTER — Ambulatory Visit: Payer: Medicare Other | Admitting: Internal Medicine

## 2022-12-14 VITALS — BP 152/80 | HR 71 | Ht 66.0 in | Wt 143.6 lb

## 2022-12-14 DIAGNOSIS — I491 Atrial premature depolarization: Secondary | ICD-10-CM

## 2022-12-14 DIAGNOSIS — R002 Palpitations: Secondary | ICD-10-CM | POA: Diagnosis not present

## 2022-12-14 NOTE — Patient Instructions (Signed)
Medication Instructions:  Your physician recommends that you continue on your current medications as directed. Please refer to the Current Medication list given to you today.  *If you need a refill on your cardiac medications before your next appointment, please call your pharmacy*  Lab Work: None ordered.  If you have labs (blood work) drawn today and your tests are completely normal, you will receive your results only by: MyChart Message (if you have MyChart) OR A paper copy in the mail If you have any lab test that is abnormal or we need to change your treatment, we will call you to review the results.  Testing/Procedures: None ordered.  Follow-Up: At [facility], you and your health needs are our priority.  As part of our continuing mission to provide you with exceptional heart care, we have created designated Provider Care Teams.  These Care Teams include your primary Cardiologist (physician) and Advanced Practice Providers (APPs -  Physician Assistants and Nurse Practitioners) who all work together to provide you with the care you need, when you need it.  We recommend signing up for the patient portal called "MyChart".  Sign up information is provided on this After Visit Summary.  MyChart is used to connect with patients for Virtual Visits (Telemedicine).  Patients are able to view lab/test results, encounter notes, upcoming appointments, etc.  Non-urgent messages can be sent to your provider as well.   To learn more about what you can do with MyChart, go to [email or web address].    Your next appointment:   1 year(s)  The format for your next appointment:   In Person  Provider:   Lewayne Bunting, MD{or one of the following Advanced Practice Providers on your designated Care Team:   Francis Dowse, [place] Casimiro Needle "Mardelle Matte" [place], [place] Earnest Rosier, NP   Important Information About Sugar

## 2022-12-14 NOTE — Progress Notes (Signed)
HPI  [name] [name] returns today for followup. She has a h/o PAF and palpitations. She also has a h/o chronic aphagia. She also has esophageal spasm. She has not had palpitations. She noted that she was very ill back in February. She eventually improved. She was found to have a UTI. She has become more sedentary but has not fallen.      Current Outpatient Medications  Medication Sig Dispense Refill   AMBULATORY NON FORMULARY MEDICATION Peppermint altoids 1-2 tabs as needed     busPIRone (BUSPAR) 5 MG tablet TAKE 1 TABLET TWICE A DAY (Patient taking differently: Take 5 mg by mouth daily.) 180 tablet 3   Ensure (ENSURE) Take 237 mLs by mouth 4 (four) times daily.     estradiol (ESTRACE VAGINAL) 0.1 MG/GM vaginal cream Place 1 g vaginally at bedtime. 42.5 g 12   fexofenadine (ALLEGRA) 30 MG/5ML suspension Take 5 mLs (30 mg total) by mouth daily. 240 mL 12   flecainide (TAMBOCOR) 50 MG tablet TAKE 2 TABLETS EVERY MORNING AND 1 TABLET AT NIGHT 270 tablet 0   hydrocortisone cream 1 % Apply to rectal area 2 times daily. Do not use on urethra or vagina 15 g 0   hyoscyamine (LEVSIN) 0.125 MG tablet Take 0.125 mg by mouth daily as needed (for esophogeal spasms).     ibuprofen (ADVIL) 100 MG/5ML suspension Take 10 mLs (200 mg total) by mouth 3 (three) times daily. 150 mL 0   Replens Vaginal Moisturizer GEL Place 1 Application vaginally every morning. 6.7 g 1   No current facility-administered medications for this visit.     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   Vitamin D deficiency    2010    ROS:   All systems reviewed and negative except as noted in the HPI.   Past Surgical History:  Procedure Laterality Date   A&P, enterocele repair     s/p 2003   ABDOMINAL HYSTERECTOMY     BIOPSY  02/05/2020   Procedure: BIOPSY;  Surgeon: Tressia Danas, MD;  Location[facility] ENDOSCOPY;  Service: Gastroenterology;;   BREAST REDUCTION SURGERY     s/p 1996      PATIENT DENIES BREAST REDUCTION    ESOPHAGEAL DILATION  02/05/2020   Procedure: ESOPHAGEAL DILATION;  Surgeon: Tressia Danas, MD;  Location[facility] ENDOSCOPY;  Service: Gastroenterology;;   ESOPHAGEAL MANOMETRY N/A 05/07/2020   Procedure: ESOPHAGEAL MANOMETRY (EM);  Surgeon: Rachael Fee, MD;  Location: [facility] ENDOSCOPY;  Service: Endoscopy;  Laterality: N/A;   ESOPHAGOGASTRODUODENOSCOPY (EGD) WITH PROPOFOL N/A 02/05/2020   Procedure: ESOPHAGOGASTRODUODENOSCOPY (EGD) WITH PROPOFOL;  Surgeon: Tressia Danas, MD;  Location: [facility] ENDOSCOPY;  Service: Gastroenterology;  Laterality: N/A;   HEMORRHOID SURGERY     LEFT HEART CATH AND CORONARY ANGIOGRAPHY N/A 05/25/2017   Procedure: LEFT HEART CATH AND CORONARY ANGIOGRAPHY;  Surgeon: Corky Crafts, MD;  Location: [facility] INVASIVE CV LAB;  Service: Cardiovascular;  Laterality: N/A;     Family History  Problem Relation Age of Onset   Stroke Father        @ 31   Hypertension Father    Hypertension Sister    Cancer Sister 98       brain   Breast cancer Other 76       breast   Breast cancer Daughter 56       spread to her brain   Cancer Other  26       colon/MELANOMA   Stroke Mother        medication induced   Kidney disease Brother    Cancer Brother      Social History   Socioeconomic History   Marital status: Widowed    Spouse name: Not on file   Number of children: 2   Years of education: Not on file   Highest education level: Not on file  Occupational History   Occupation: Retired  Tobacco Use   Smoking status: Former    Current packs/day: 0.00    Types: Cigarettes    Quit date: 05/24/1968    Years since quitting: 54.5   Smokeless tobacco: Never   Tobacco comments:    only smoked 7 years  Vaping Use   Vaping status: Never Used  Substance and Sexual Activity   Alcohol use: No    Alcohol/week: 0.0 standard drinks of alcohol   Drug use: No   Sexual activity: Never    Comment: 1st intercourse 76 yo-1 partner  Other Topics Concern   Not on file  Social History Narrative   Not on file   Social Determinants of Health   Financial Resource Strain: Low Risk  (12/07/2021)   Overall Financial Resource Strain (CARDIA)    Difficulty of Paying Living Expenses: Not hard at all  Food Insecurity: No Food Insecurity (07/13/2022)   Hunger Vital Sign    Worried About Running Out of Food in the Last Year: Never true    Ran Out of Food in the Last Year: Never true  Transportation Needs: No Transportation Needs (07/11/2022)   PRAPARE - Administrator, Civil Service (Medical): No    Lack of Transportation (Non-Medical): No  Physical Activity: Insufficiently Active (12/07/2021)   Exercise Vital Sign    Days of Exercise per Week: 7 days    Minutes of Exercise per Session: 10 min  Stress: No Stress Concern Present (12/07/2021)   [employer] of Occupational Health - Occupational Stress Questionnaire    Feeling of Stress : Not at all  Social Connections: Socially Isolated (12/07/2021)   Social Connection and Isolation Panel [NHANES]    Frequency of Communication with Friends and Family: Once a week     Frequency of Social Gatherings with Friends and Family: Once a week    Attends Religious Services: Never    Database administrator or Organizations: No    Attends Banker Meetings: Never    Marital Status: Widowed  Intimate Partner Violence: Not  At Risk (07/11/2022)   Humiliation, Afraid, Rape, and Kick questionnaire    Fear of Current or Ex-Partner: No    Emotionally Abused: No    Physically Abused: No    Sexually Abused: No     BP (!) 152/80   Pulse 71   Ht 5\' 6"  (1.676 m)   Wt 143 lb 9.6 oz (65.1 kg)   LMP 05/24/1970   SpO2 98%   BMI 23.18 kg/m   Physical Exam:  Well appearing NAD HEENT: Unremarkable Neck:  No JVD, no thyromegally Lymphatics:  No adenopathy Back:  No CVA tenderness Lungs:  Clear HEART:  Regular rate rhythm, no murmurs, no rubs, no clicks Abd:  soft, positive bowel sounds, no organomegally, no rebound, no guarding Ext:  2 plus pulses, no edema, no cyanosis, no clubbing Skin:  No rashes no nodules Neuro:  CN II through XII intact, motor grossly intact  Assess/Plan: PAC's/PAF - she is doing well and will continue her flecainide which she has been taking for over 20 years.  HTN - her bp is elevated. However she has had trouble with low bp in the past. She may require additional bp lowering.  Sharlot Gowda Arrian Manson,MD

## 2023-01-17 ENCOUNTER — Other Ambulatory Visit: Payer: Self-pay | Admitting: Internal Medicine

## 2023-01-19 ENCOUNTER — Ambulatory Visit (INDEPENDENT_AMBULATORY_CARE_PROVIDER_SITE_OTHER): Payer: Medicare Other

## 2023-01-19 VITALS — Ht 66.0 in | Wt 143.0 lb

## 2023-01-19 DIAGNOSIS — Z0000 Encounter for general adult medical examination without abnormal findings: Secondary | ICD-10-CM | POA: Diagnosis not present

## 2023-01-19 NOTE — Progress Notes (Signed)
Subjective:   [name] [name] is a 89 y.o. female who presents for Medicare Annual (Subsequent) preventive examination.  Visit Complete: Virtual  I connected with  [name] [name] on 01/19/23 by a audio enabled telemedicine application and verified that I am speaking with the correct person using two identifiers.  Patient Location: Home  Provider Location: Home Office  I discussed the limitations of evaluation and management by telemedicine. The patient expressed understanding and agreed to proceed.  Review of Systems    Cardiac Risk Factors include: advanced age (>65men, >47 women);hypertension;Other (see comment);dyslipidemia, Risk factor comments: PAC, Osteoporosis     Objective:    Today's Vitals   01/19/23 1120  Weight: 143 lb (64.9 kg)  Height: 5\' 6"  (1.676 m)   Body mass index is 23.08 kg/m.     01/19/2023   11:30 AM 08/13/2022   10:27 AM 08/03/2022    9:46 AM 07/15/2022    9:53 AM 07/11/2022    3:06 AM 07/10/2022    7:54 PM 12/07/2021    2:08 PM  Advanced Directives  Does Patient Have a Medical Advance Directive? Yes No Yes No No Yes No  Type of Estate agent of [place];Living will Healthcare Power of [place];Living will Living will;Healthcare Power of Attorney      Does patient want to make changes to medical advance directive? No - Patient declined No - Patient declined No - Patient declined      Copy of Healthcare Power of Attorney in Chart? Yes - validated most recent copy scanned in chart (See row information) Yes - validated most recent copy scanned in chart (See row information) Yes - validated most recent copy scanned in chart (See row information)      Would patient like information on creating a medical advance directive?   No - Patient declined No - Patient declined No - Patient declined  No - Guardian declined    Current Medications (verified) Outpatient Encounter Medications as of 01/19/2023  Medication Sig   AMBULATORY NON FORMULARY  MEDICATION Peppermint altoids 1-2 tabs as needed   busPIRone (BUSPAR) 5 MG tablet TAKE 1 TABLET TWICE A DAY (Patient taking differently: Take 5 mg by mouth daily.)   Ensure (ENSURE) Take 237 mLs by mouth 4 (four) times daily.   estradiol (ESTRACE VAGINAL) 0.1 MG/GM vaginal cream Place 1 g vaginally at bedtime.   fexofenadine (ALLEGRA) 30 MG/5ML suspension Take 5 mLs (30 mg total) by mouth daily.   flecainide (TAMBOCOR) 50 MG tablet TAKE 2 TABLETS EVERY MORNING AND 1 TABLET AT NIGHT   hydrocortisone cream 1 % Apply to rectal area 2 times daily. Do not use on urethra or vagina   hyoscyamine (LEVSIN) 0.125 MG tablet Take 0.125 mg by mouth daily as needed (for esophogeal spasms).   ibuprofen (ADVIL) 100 MG/5ML suspension Take 10 mLs (200 mg total) by mouth 3 (three) times daily.   Replens Vaginal Moisturizer GEL Place 1 Application vaginally every morning.   No facility-administered encounter medications on file as of 01/19/2023.    Allergies (verified) Other, Pantoprazole, Zyrtec [cetirizine], Ciprofloxacin, and Nifedipine   History: Past Medical History:  Diagnosis Date   Anemia    iron def   Anxiety    Arrhythmia    paroxysmal SVT and symtomatic PAC's   ATRIAL PREMATURE BEATS 03/20/2009       Ladona Ridgel, MD, [facility], [facility]      Cerumen impaction 08/27/2021   Common bile duct dilation 04/02/2021   Diverticulosis  colon   Dysuria 03/07/2019   Elevated troponin    Gastritis and gastroduodenitis    GERD 04/02/2008   Qualifier: Diagnosis of  By: Jonny Ruiz MD, Len Blalock    History of colonic polyps    cecal   Hypertension    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   Skin cancer of arm 09/26/2014   Splenic artery aneurysm (HCC) 04/02/2021   Stricture esophagus    TMJ pain dysfunction syndrome  09/25/2018   Vitamin D deficiency    2010   Past Surgical History:  Procedure Laterality Date   A&P, enterocele repair     s/p 2003   ABDOMINAL HYSTERECTOMY     BIOPSY  02/05/2020   Procedure: BIOPSY;  Surgeon: Tressia Danas, MD;  Location: [facility] ENDOSCOPY;  Service: Gastroenterology;;   BREAST REDUCTION SURGERY     s/p 1996      PATIENT DENIES BREAST REDUCTION    ESOPHAGEAL DILATION  02/05/2020   Procedure: ESOPHAGEAL DILATION;  Surgeon: Tressia Danas, MD;  Location[facility] END[facility]COPY;  Service: Gastroenterology;;   ESOPHAGEAL MANOMETRY N/A 05/07/2020   Procedure: ESOPHAGEAL MANOMETRY (EM);  Surgeon: Rachael Fee, MD;  Location: [facility] ENDOSCOPY;  Service: Endoscopy;  Laterality: N/A;   ESOPHAGOGASTRODUODENOSCOPY (EGD) WITH PROPOFOL N/A 02/05/2020   Procedure: ESOPHAGOGASTRODUODENOSCOPY (EGD) WITH PROPOFOL;  Surgeon: Tressia Danas, MD;  Location[facility] ENDOSCOPY;  Service: Gastroenterology;  Laterality: N/A;   HEMORRHOID SURGERY     LEFT HEART CATH AND CORONARY ANGIOGRAPHY N/A 05/25/2017   Procedure: LEFT HEART CATH AND CORONARY ANGIOGRAPHY;  Surgeon: Corky Crafts, MD;  Location: [facility] INVASIVE CV LAB;  Service: Cardiovascular;  Laterality: N/A;   Family History  Problem Relation Age of Onset   Stroke Father        @ 36   Hypertension Father    Hypertension Sister    Cancer Sister 52       brain   Breast cancer Other 35       breast   Breast cancer Daughter 58       spread to her brain   Cancer Other 16       colon/MELANOMA   Stroke Mother        medication induced   Kidney disease Brother    Cancer Brother    Social History   Socioeconomic History   Marital status: Widowed    Spouse name: Not on file   Number of children: 2   Years of education: Not on file   Highest education level: Not on file  Occupational History   Occupation: Retired  Tobacco Use   Smoking status: Former    Current packs/day: 0.00    Types: Cigarettes    Quit date: 05/24/1968    Years  since quitting: 54.6   Smokeless tobacco: Never   Tobacco comments:    only smoked 7 years  Vaping Use   Vaping status: Never Used  Substance and Sexual Activity   Alcohol use: No    Alcohol/week: 0.0 standard drinks of alcohol   Drug use: No   Sexual activity: Never    Comment: 1st intercourse 27 yo-1 partner  Other Topics Concern   Not on file  Social History Narrative   Lives alone   Social Determinants of Corporate investment banker  Strain: Low Risk  (12/07/2021)   Overall Financial Resource Strain (CARDIA)    Difficulty of Paying Living Expenses: Not hard at all  Food Insecurity: No Food Insecurity (07/13/2022)   Hunger Vital Sign    Worried About Running Out of Food in the Last Year: Never true    Ran Out of Food in the Last Year: Never true  Transportation Needs: No Transportation Needs (07/11/2022)   PRAPARE - Administrator, Civil Service (Medical): No    Lack of Transportation (Non-Medical): No  Physical Activity: Insufficiently Active (12/07/2021)   Exercise Vital Sign    Days of Exercise per Week: 7 days    Minutes of Exercise per Session: 10 min  Stress: No Stress Concern Present (12/07/2021)   [employer] of Occupational Health - Occupational Stress Questionnaire    Feeling of Stress : Not at all  Social Connections: Socially Isolated (12/07/2021)   Social Connection and Isolation Panel [NHANES]    Frequency of Communication with Friends and Family: Once a week    Frequency of Social Gatherings with Friends and Family: Once a week    Attends Religious Services: Never    Database administrator or Organizations: No    Attends Banker Meetings: Never    Marital Status: Widowed    Tobacco Counseling Counseling given: Not Answered Tobacco comments: only smoked 7 years   Clinical Intake:  Pre-visit preparation completed: Yes        BMI - recorded: 23.08 Nutritional Status: BMI of 19-24  Normal Nutritional Risks:  None Diabetes: No  How often do you need to have someone help you when you read instructions, pamphlets, or other written materials from your doctor or pharmacy?: 3 - Sometimes  Interpreter Needed?: No  Information entered by :: Carlisle Enke, RMA   Activities of Daily Living    01/19/2023   11:22 AM 08/13/2022   10:26 AM  In your present state of health, do you have any difficulty performing the following activities:  Hearing? 1 0  Comment Wears hearing aides   Vision? 0 0  Difficulty concentrating or making decisions? 0 0  Walking or climbing stairs? 0 1  Dressing or bathing? 0 0  Doing errands, shopping? 1 1  Comment Pt son drives her around   Quarry manager and eating ? N   Using the Toilet? N   In the past six months, have you accidently leaked urine? N   Do you have problems with loss of bowel control? N   Managing your Medications? N   Managing your Finances? N   Housekeeping or managing your Housekeeping? N     Patient Care Team: Kathleen Lime, MD as PCP - General Fontaine, Nadyne Coombes, MD (Inactive) (Obstetrics and Gynecology) Marinus Maw, MD (Cardiology)  Indicate any recent Medical Services you may have received from other than [facility] providers in the past year (date may be approximate).     Assessment:   This is a routine wellness examination for [name] [name].  Hearing/Vision screen Hearing Screening - Comments:: Wears hearing aides Vision Screening - Comments:: Denies vision issues.  Dietary issues and exercise activities discussed:     Goals Addressed   None   Depression Screen    08/13/2022   10:26 AM 08/03/2022   10:12 AM 07/15/2022    9:57 AM 06/02/2022    1:21 PM 12/07/2021    2:05 PM 08/25/2021    4:46 PM 08/25/2021    3:43 PM  PHQ 2/9 Scores  PHQ - 2 Score 0 0 0 0 0 0 0    Fall Risk    01/19/2023   11:30 AM 08/13/2022   10:26 AM 08/03/2022   10:12 AM 07/15/2022    9:53 AM 06/02/2022    1:19 PM  Fall Risk   Falls in the past year? 0 0 0 1 1   Number falls in past yr: 0 0 0 0 0  Injury with Fall? 0 0 0 0 0  Risk for fall due to : No Fall Risks No Fall Risks Other (Comment)  History of fall(s);Impaired balance/gait  Risk for fall due to: Comment   > 46 yo    Follow up Falls prevention discussed;Falls evaluation completed Falls evaluation completed;Falls prevention discussed Falls evaluation completed Falls evaluation completed Falls evaluation completed    MEDICARE RISK AT HOME: Medicare Risk at Home Any stairs in or around the home?: Yes (going into the basement) If so, are there any without handrails?: Yes Home free of loose throw rugs in walkways, pet beds, electrical cords, etc?: Yes Adequate lighting in your home to reduce risk of falls?: Yes Life alert?: Yes (has one but does wear it) Use of a cane, walker or w/c?: No Grab bars in the bathroom?: No Shower chair or bench in shower?: Yes Elevated toilet seat or a handicapped toilet?: No  TIMED UP AND GO:  Was the test performed?  No    Cognitive Function:        01/19/2023   11:32 AM 12/07/2021    2:10 PM  6CIT Screen  What Year? 0 points 0 points  What month? 0 points 0 points  What time? 0 points 0 points  Count back from 20 0 points 0 points  Months in reverse 0 points 0 points  Repeat phrase 4 points 0 points  Total Score 4 points 0 points    Immunizations Immunization History  Administered Date(s) Administered   Influenza Split 05/25/2011   Influenza, High Dose Seasonal PF 03/10/2018, 02/17/2019   Influenza,inj,Quad PF,6+ Mos 03/27/2013, 04/11/2014, 03/14/2016, 03/28/2020   Influenza-Unspecified 04/09/2015, 03/24/2017   PFIZER(Purple Top)SARS-COV-2 Vaccination 07/16/2019, 08/06/2019, 04/10/2020, 12/12/2020   Pfizer Covid-19 Vaccine Bivalent Booster 38yrs & up 04/04/2021   Pneumococcal-Unspecified 03/24/2016   Tdap 03/25/2016    TDAP status: Up to date  Flu Vaccine status: Due, Education has been provided regarding the importance of this  vaccine. Advised may receive this vaccine at local pharmacy or Health Dept. Aware to provide a copy of the vaccination record if obtained from local pharmacy or Health Dept. Verbalized acceptance and understanding.  Pneumococcal vaccine status: Due, Education has been provided regarding the importance of this vaccine. Advised may receive this vaccine at local pharmacy or Health Dept. Aware to provide a copy of the vaccination record if obtained from local pharmacy or Health Dept. Verbalized acceptance and understanding.  Covid-19 vaccine status: Completed vaccines  Qualifies for Shingles Vaccine? Yes   Zostavax completed Yes   Shingrix Completed?: No.    Education has been provided regarding the importance of this vaccine. Patient has been advised to call insurance company to determine out of pocket expense if they have not yet received this vaccine. Advised may also receive vaccine at local pharmacy or Health Dept. Verbalized acceptance and understanding.  Screening Tests Health Maintenance  Topic Date Due   Zoster Vaccines- Shingrix (1 of 2) Never done   Pneumonia Vaccine 45+ Years old (1 of 1 - PCV) 01/22/1997  COVID-19 Vaccine (6 - 2023-24 season) 01/22/2022   INFLUENZA VACCINE  12/23/2022   Medicare Annual Wellness (AWV)  01/19/2024   DTaP/Tdap/Td (2 - Td or Tdap) 03/25/2026   DEXA SCAN  Completed   HPV VACCINES  Aged Out    Health Maintenance  Health Maintenance Due  Topic Date Due   Zoster Vaccines- Shingrix (1 of 2) Never done   Pneumonia Vaccine 9+ Years old (1 of 1 - PCV) 01/22/1997   COVID-19 Vaccine (6 - 2023-24 season) 01/22/2022   INFLUENZA VACCINE  12/23/2022    Colorectal cancer screening: No longer required.   Mammogram status: No longer required due to age.  Bone Density status: Completed 07/12/2011. Results reflect: Bone density results: OSTEOPOROSIS. Repeat every 2 years.  Lung Cancer Screening: (Low Dose CT Chest recommended if Age 34-80 years, 20  pack-year currently smoking OR have quit w/in 15years.) does not qualify.   Lung Cancer Screening Referral: N/A  Additional Screening:  Hepatitis C Screening: does not qualify;   Vision Screening: Recommended annual ophthalmology exams for early detection of glaucoma and other disorders of the eye. Is the patient up to date with their annual eye exam?  Yes  Who is the provider or what is the name of the office in which the patient attends annual eye exams? Dr. Dione Booze If pt is not established with a provider, would they like to be referred to a provider to establish care? No .   Dental Screening: Recommended annual dental exams for proper oral hygiene   Community Resource Referral / Chronic Care Management: CRR required this visit?  No   CCM required this visit?  No     Plan:     I have personally reviewed and noted the following in the patient's chart:   Medical and social history Use of alcohol, tobacco or illicit drugs  Current medications and supplements including opioid prescriptions. Patient is not currently taking opioid prescriptions. Functional ability and status Nutritional status Physical activity Advanced directives List of other physicians Hospitalizations, surgeries, and ER visits in previous 12 months Vitals Screenings to include cognitive, depression, and falls Referrals and appointments  In addition, I have reviewed and discussed with patient certain preventive protocols, quality metrics, and best practice recommendations. A written personalized care plan for preventive services as well as general preventive health recommendations were provided to patient.     Rylend Pietrzak L Chaddrick Brue, CMA   01/19/2023   After Visit Summary: (MyChart) Due to this being a telephonic visit, the after visit summary with patients personalized plan was offered to patient via MyChart   Nurse Notes: Patient is due for Pneumonia vaccine and Shingrix vaccines, however she declines due to  her age.  Patient explains that she would perfer to have a female PCP from here on out.  She had no other concerns today.

## 2023-01-19 NOTE — Patient Instructions (Signed)
Ms. Poznanski , Thank you for taking time to come for your Medicare Wellness Visit. I appreciate your ongoing commitment to your health goals. Please review the following plan we discussed and let me know if I can assist you in the future.   Referrals/Orders/Follow-Ups/Clinician Recommendations: You should think about getting your Pneumonia vaccine, as you can get this at your local pharmacy.  It was nice talking with you today and keep up the good work.  This is a list of the screening recommended for you and due dates:  Health Maintenance  Topic Date Due   Zoster (Shingles) Vaccine (1 of 2) 01/23/1949   Pneumonia Vaccine (2 of 2 - PCV) 03/24/2017   COVID-19 Vaccine (6 - 2023-24 season) 01/22/2022   Flu Shot  12/23/2022   Medicare Annual Wellness Visit  01/19/2024   DTaP/Tdap/Td vaccine (2 - Td or Tdap) 03/25/2026   DEXA scan (bone density measurement)  Completed   HPV Vaccine  Aged Out    Advanced directives: (In Chart) A copy of your advanced directives are scanned into your chart should your provider ever need it.  Next Medicare Annual Wellness Visit scheduled for next year: Yes

## 2023-02-02 ENCOUNTER — Other Ambulatory Visit: Payer: Self-pay | Admitting: Internal Medicine

## 2023-02-02 DIAGNOSIS — F419 Anxiety disorder, unspecified: Secondary | ICD-10-CM

## 2023-02-18 ENCOUNTER — Emergency Department (HOSPITAL_COMMUNITY): Payer: Medicare Other

## 2023-02-18 ENCOUNTER — Other Ambulatory Visit: Payer: Self-pay

## 2023-02-18 ENCOUNTER — Emergency Department (HOSPITAL_COMMUNITY)
Admission: EM | Admit: 2023-02-18 | Discharge: 2023-02-18 | Disposition: A | Discharge status: Home / Self Care | Payer: Medicare Other | Attending: Emergency Medicine | Admitting: Emergency Medicine

## 2023-02-18 ENCOUNTER — Encounter (HOSPITAL_COMMUNITY): Payer: Self-pay

## 2023-02-18 DIAGNOSIS — R001 Bradycardia, unspecified: Secondary | ICD-10-CM | POA: Diagnosis not present

## 2023-02-18 DIAGNOSIS — I700 Atherosclerosis of aorta: Secondary | ICD-10-CM | POA: Diagnosis not present

## 2023-02-18 DIAGNOSIS — I491 Atrial premature depolarization: Secondary | ICD-10-CM | POA: Diagnosis not present

## 2023-02-18 DIAGNOSIS — I10 Essential (primary) hypertension: Secondary | ICD-10-CM | POA: Diagnosis not present

## 2023-02-18 DIAGNOSIS — Z85828 Personal history of other malignant neoplasm of skin: Secondary | ICD-10-CM | POA: Insufficient documentation

## 2023-02-18 DIAGNOSIS — Z87891 Personal history of nicotine dependence: Secondary | ICD-10-CM | POA: Insufficient documentation

## 2023-02-18 DIAGNOSIS — R079 Chest pain, unspecified: Secondary | ICD-10-CM | POA: Diagnosis not present

## 2023-02-18 DIAGNOSIS — R0789 Other chest pain: Secondary | ICD-10-CM | POA: Diagnosis not present

## 2023-02-18 LAB — BASIC METABOLIC PANEL
Anion gap: 12 (ref 5–15)
BUN: 19 mg/dL (ref 8–23)
CO2: 26 mmol/L (ref 22–32)
Calcium: 9 mg/dL (ref 8.9–10.3)
Chloride: 102 mmol/L (ref 98–111)
Creatinine, Ser: 0.76 mg/dL (ref 0.44–1.00)
GFR, Estimated: >60 mL/min (ref >60)
Glucose, Bld: 99 mg/dL (ref 70–99)
Potassium: 3.7 mmol/L (ref 3.5–5.1)
Sodium: 140 mmol/L (ref 135–145)

## 2023-02-18 LAB — TROPONIN I (HIGH SENSITIVITY)
Troponin I (High Sensitivity): 6 ng/L (ref <18)
Troponin I (High Sensitivity): 5 ng/L (ref <18)

## 2023-02-18 LAB — CBC
HCT: 38 % (ref 36.0–46.0)
Hemoglobin: 12.5 g/dL (ref 12.0–15.0)
MCH: 31.4 pg (ref 26.0–34.0)
MCHC: 32.9 g/dL (ref 30.0–36.0)
MCV: 95.5 fL (ref 80.0–100.0)
Platelets: 230 10*3/uL (ref 150–400)
RBC: 3.98 MIL/uL (ref 3.87–5.11)
RDW: 12.9 % (ref 11.5–15.5)
WBC: 4.1 10*3/uL (ref 4.0–10.5)
nRBC: 0 % (ref 0.0–0.2)

## 2023-02-18 LAB — LIPASE, BLOOD: Lipase: 25 U/L (ref 11–51)

## 2023-02-18 NOTE — Discharge Instructions (Signed)
You were evaluated in the Emergency Department and after careful evaluation, we did not find any emergent condition requiring admission or further testing in the hospital. ° °Your exam/testing today is overall reassuring.  Recommend follow-up with your regular doctors to discuss your symptoms. ° °Please return to the Emergency Department if you experience any worsening of your condition.   Thank you for allowing us to be a part of your care. °

## 2023-02-18 NOTE — ED Provider Notes (Signed)
[facility] Emergency Department Provider Note MRN:  [id number]  Arrival date & time: 02/18/23     Chief Complaint   Bradycardia   History of Present Illness   [name] [name] is a 89 y.o. year-old female with a history of arrhythmia presenting to the ED with chief complaint of bradycardia.  Episode of chest discomfort after drinking water this evening.  Also ate a hard-boiled egg for the first time in years and is wondering if that caused her issues.  Immediately after drinking the cold water she felt a funny feeling in her chest, malaise, discomfort, then noted that her heart rate was low in the 40s.  Here for evaluation.  Feels better now.  Review of Systems  A thorough review of systems was obtained and all systems are negative except as noted in the HPI and PMH.   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   Vitamin D deficiency    2010    Past Surgical History:  Procedure Laterality Date   A&P, enterocele repair     s/p 2003   ABDOMINAL HYSTERECTOMY     BIOPSY  02/05/2020   Procedure: BIOPSY;   Surgeon: Tressia Danas, MD;  Location: [facility] ENDOSCOPY;  Service: Gastroenterology;;   BREAST REDUCTION SURGERY     s/p 1996      PATIENT DENIES BREAST REDUCTION    ESOPHAGEAL DILATION  02/05/2020   Procedure: ESOPHAGEAL DILATION;  Surgeon: Tressia Danas, MD;  Location: [facility] ENDOSCOPY;  Service: Gastroenterology;;   ESOPHAGEAL MANOMETRY N/A 05/07/2020   Procedure: ESOPHAGEAL MANOMETRY (EM);  Surgeon: Rachael Fee, MD;  Location: [facility] ENDOSCOPY;  Service: Endoscopy;  Laterality: N/A;   ESOPHAGOGASTRODUODENOSCOPY (EGD) WITH PROPOFOL N/A 02/05/2020   Procedure: ESOPHAGOGASTRODUODENOSCOPY (EGD) WITH PROPOFOL;  Surgeon: Tressia Danas, MD;  Location: [facility] ENDOSCOPY;  Service: Gastroenterology;  Laterality: N/A;   HEMORRHOID SURGERY     LEFT HEART CATH AND CORONARY ANGIOGRAPHY N/A 05/25/2017   Procedure: LEFT HEART CATH AND CORONARY ANGIOGRAPHY;  Surgeon: Corky Crafts, MD;  Location: [facility] INVASIVE CV LAB;  Service: Cardiovascular;  Laterality: N/A;    Family History  Problem Relation Age of Onset   Stroke Father        @ 74   Hypertension Father    Hypertension Sister    Cancer Sister 52       brain   Breast cancer Other 63       breast  Breast cancer Daughter 77       spread to her brain   Cancer Other 26       colon/MELANOMA   Stroke Mother        medication induced   Kidney disease Brother    Cancer Brother     Social History   Socioeconomic History   Marital status: Widowed    Spouse name: Not on file   Number of children: 2   Years of education: Not on file   Highest education level: Not on file  Occupational History   Occupation: Retired  Tobacco Use   Smoking status: Former    Current packs/day: 0.00    Types: Cigarettes    Quit date: 05/24/1968    Years since quitting: 54.7   Smokeless tobacco: Never   Tobacco comments:    only smoked 7 years  Vaping Use   Vaping status: Never Used  Substance and Sexual Activity   Alcohol use: No    Alcohol/week: 0.0  standard drinks of alcohol   Drug use: No   Sexual activity: Never    Comment: 1st intercourse 18 yo-1 partner  Other Topics Concern   Not on file  Social History Narrative   Lives alone   Social Determinants of Health   Financial Resource Strain: Low Risk  (01/19/2023)   Overall Financial Resource Strain (CARDIA)    Difficulty of Paying Living Expenses: Not hard at all  Food Insecurity: No Food Insecurity (01/19/2023)   Hunger Vital Sign    Worried About Running Out of Food in the Last Year: Never true    Ran Out of Food in the Last Year: Never true  Transportation Needs: No Transportation Needs (01/19/2023)   PRAPARE - Administrator, Civil Service (Medical): No    Lack of Transportation (Non-Medical): No  Physical Activity: Insufficiently Active (01/19/2023)   Exercise Vital Sign    Days of Exercise per Week: 3 days    Minutes of Exercise per Session: 20 min  Stress: No Stress Concern Present (01/19/2023)   [employer] of Occupational Health - Occupational Stress Questionnaire    Feeling of Stress : Not at all  Social Connections: Socially Isolated (01/19/2023)   Social Connection and Isolation Panel [NHANES]    Frequency of Communication with Friends and Family: Never    Frequency of Social Gatherings with Friends and Family: More than three times a week    Attends Religious Services: Never    Database administrator or Organizations: No    Attends Banker Meetings: Never    Marital Status: Widowed  Intimate Partner Violence: Patient Unable To Answer (01/19/2023)   Humiliation, Afraid, Rape, and Kick questionnaire    Fear of Current or Ex-Partner: Patient unable to answer    Emotionally Abused: Patient unable to answer    Physically Abused: Patient unable to answer    Sexually Abused: Patient unable to answer     Physical Exam   Vitals:   02/18/23 0545 02/18/23 0600  BP: (!) 127/53 (!) 120/46  Pulse: 77 69  Resp: 17 13  Temp:    SpO2: 96%  98%    CONSTITUTIONAL: Well-appearing, NAD NEURO/PSYCH:  Alert and oriented x 3, no focal deficits EYES:  eyes equal and reactive ENT/NECK:  no LAD, no JVD CARDIO: Regular rate, well-perfused, normal S1 and S2 PULM:  CTAB no wheezing or rhonchi GI/GU:  non-distended, non-tender MSK/SPINE:  No gross deformities, no edema SKIN:  no  rash, atraumatic   *Additional and/or pertinent findings included in MDM below  Diagnostic and Interventional Summary    EKG Interpretation Date/Time:  Friday February 18 2023 03:28:31 EDT Ventricular Rate:  83 PR Interval:  204 QRS Duration:  92 QT Interval:  401 QTC Calculation: 398 R Axis:   64  Text Interpretation: Sinus rhythm Supraventricular bigeminy Low voltage, precordial leads Confirmed by Kennis Carina [phone]) on 02/18/2023 4:30:31 AM       Labs Reviewed  CBC  BASIC METABOLIC PANEL  LIPASE, BLOOD  TROPONIN I (HIGH SENSITIVITY)  TROPONIN I (HIGH SENSITIVITY)    DG Chest Port 1 View  Final Result      Medications - No data to display   Procedures  /  Critical Care Procedures  ED Course and Medical Decision Making  Initial Impression and Ddx Suspect GERD related discomfort with a vagal reaction causing the bradycardia.  Heart rate in the 80s now, occasional PACs.  No real complaints at this time.  ACS is also considered.  Past medical/surgical history that increases complexity of ED encounter: Arrhythmia on flecainide  Interpretation of Diagnostics I personally reviewed the EKG and my interpretation is as follows: Atrial bigeminal  Labs reassuring with no significant blood count or electrolyte disturbance.  Troponin negative x 2.  Patient Reassessment and Ultimate Disposition/Management     Patient feeling well, would like to go home.  Vitals remain normal, with reassuring workup she is appropriate for discharge.  Patient management required discussion with the following services or consulting groups:  None  Complexity  of Problems Addressed Acute illness or injury that poses threat of life of bodily function  Additional Data Reviewed and Analyzed Further history obtained from: Recent Consult notes and Prior labs/imaging results  Additional Factors Impacting ED Encounter Risk Consideration of hospitalization  Elmer Sow. Pilar Plate, MD [facility] Emergency Medicine [facility] [email or web address]  Final Clinical Impressions(s) / ED Diagnoses     ICD-10-CM   1. Chest pain, unspecified type  R07.9       ED Discharge Orders     None        Discharge Instructions Discussed with and Provided to Patient:     Discharge Instructions      You were evaluated in the Emergency Department and after careful evaluation, we did not find any emergent condition requiring admission or further testing in the hospital.  Your exam/testing today is overall reassuring.  Recommend follow-up with your regular doctors to discuss your symptoms.  Please return to the Emergency Department if you experience any worsening of your condition.   Thank you for allowing [place] to be a part of your care.       Sabas Sous, MD 02/18/23 [phone]

## 2023-02-18 NOTE — ED Triage Notes (Signed)
Patient BIB [facility] from home due to bradycardia. Patient stated she was feeling funky so she checked her HR and BP and her HR was low. Patient has hx of arrythmias. EMS reports sinus brady. Patient denies CP, SOB, & N/V/D. Patient is A&Ox4.

## 2023-02-25 ENCOUNTER — Telehealth: Payer: Self-pay | Admitting: Internal Medicine

## 2023-02-25 NOTE — Telephone Encounter (Signed)
Patient called after hours pager number.  This is a 89 year old with PMH of PAF and palpitations is concerned as her heart rate remains low. She was seen in ED 09/27 for same. At that visit, HR was in 40s and she had some chest discomfort. EKG was completed that showed sinus rhythm with bigeminy and HR in 80s. Troponins were negative.  This afternoon, she developed some numbness under he left breast. She took her blood pressure and her HR was 39 with BP of 145/83.   She just retook her Vitals and her BP remains normal and HR is now in 80s.  She denies chest pain/ discomfort, shortness of breath, dizziness. The numbness in her left breast has resolved.   P: Patient with asymptomatic bradycardia. I reviewed return precautions to ED with patient. As her HR is now in 80s and she had only numbness earlier today I do not think she need emergent evaluation today. This is her 2nd encounter for bradycardia in last week. I do question if flecainide might be playing into bradycardia. She notes that she has been on this medication for 20+ years. I still think it would be beneficial for patient to be seen by her cardiology, Dr. Ladona Ridgel, with concerns for flecainide induced bradycardia.  -message sent to Dr. Ladona Ridgel for follow-up. Patient can be followed up by [facility] as well if he does not have opening soon.

## 2023-03-04 ENCOUNTER — Emergency Department (HOSPITAL_COMMUNITY)
Admission: EM | Admit: 2023-03-04 | Discharge: 2023-03-05 | Disposition: A | Discharge status: Home / Self Care | Payer: Medicare Other | Attending: Emergency Medicine | Admitting: Emergency Medicine

## 2023-03-04 ENCOUNTER — Other Ambulatory Visit: Payer: Self-pay

## 2023-03-04 ENCOUNTER — Encounter (HOSPITAL_COMMUNITY): Payer: Self-pay

## 2023-03-04 DIAGNOSIS — R000 Tachycardia, unspecified: Secondary | ICD-10-CM | POA: Diagnosis not present

## 2023-03-04 DIAGNOSIS — R300 Dysuria: Secondary | ICD-10-CM

## 2023-03-04 DIAGNOSIS — R109 Unspecified abdominal pain: Secondary | ICD-10-CM | POA: Diagnosis not present

## 2023-03-04 DIAGNOSIS — R079 Chest pain, unspecified: Secondary | ICD-10-CM | POA: Insufficient documentation

## 2023-03-04 DIAGNOSIS — K5730 Diverticulosis of large intestine without perforation or abscess without bleeding: Secondary | ICD-10-CM | POA: Diagnosis not present

## 2023-03-04 DIAGNOSIS — R1030 Lower abdominal pain, unspecified: Secondary | ICD-10-CM | POA: Insufficient documentation

## 2023-03-04 DIAGNOSIS — I700 Atherosclerosis of aorta: Secondary | ICD-10-CM | POA: Diagnosis not present

## 2023-03-04 DIAGNOSIS — R350 Frequency of micturition: Secondary | ICD-10-CM | POA: Insufficient documentation

## 2023-03-04 DIAGNOSIS — I213 ST elevation (STEMI) myocardial infarction of unspecified site: Secondary | ICD-10-CM | POA: Diagnosis not present

## 2023-03-04 DIAGNOSIS — K5900 Constipation, unspecified: Secondary | ICD-10-CM

## 2023-03-04 DIAGNOSIS — K838 Other specified diseases of biliary tract: Secondary | ICD-10-CM | POA: Diagnosis not present

## 2023-03-04 DIAGNOSIS — I10 Essential (primary) hypertension: Secondary | ICD-10-CM | POA: Diagnosis not present

## 2023-03-04 NOTE — ED Provider Notes (Signed)
[place] EMERGENCY DEPARTMENT AT [facility] Provider Note   CSN: [id number] Arrival date & time: 03/04/23  2242     History  Chief Complaint  Patient presents with   Urinary Tract Infection    [name] [name] is a 89 y.o. female.  The history is provided by the patient.  Urinary Tract Infection [name] [name] is a 89 y.o. female who presents to the Emergency Department complaining of area.  She presents emergency department for evaluation of dysuria that started 430 this evening.  Pain radiates to her chest and shoulders.  She has associated urinary frequency.  No fever, nausea, vomiting.  No constipation.  No wounds or rashes.   Home Medications Prior to Admission medications   Medication Sig Start Date End Date Taking? Authorizing Provider  AMBULATORY NON FORMULARY MEDICATION Peppermint altoids 1-2 tabs as needed    [provider]  busPIRone (BUSPAR) 5 MG tablet TAKE 1 TABLET TWICE A DAY 02/09/23   Kathleen Lime, MD  Ensure (ENSURE) Take 237 mLs by mouth 4 (four) times daily.    [provider]  estradiol (ESTRACE VAGINAL) 0.1 MG/GM vaginal cream Place 1 g vaginally at bedtime. 08/13/22   Belva Agee, MD  fexofenadine (ALLEGRA) 30 MG/5ML suspension Take 5 mLs (30 mg total) by mouth daily. 07/29/22   Willette Cluster, MD  flecainide (TAMBOCOR) 50 MG tablet TAKE 2 TABLETS EVERY MORNING AND 1 TABLET AT NIGHT 01/19/23   Marinus Maw, MD  hydrocortisone cream 1 % Apply to rectal area 2 times daily. Do not use on urethra or vagina 07/31/22   Rising, Lurena Joiner, PA-C  hyoscyamine (LEVSIN) 0.125 MG tablet Take 0.125 mg by mouth daily as needed (for esophogeal spasms).    [provider]  ibuprofen (ADVIL) 100 MG/5ML suspension Take 10 mLs (200 mg total) by mouth 3 (three) times daily. 07/31/22   Rising, Lurena Joiner, PA-C  Replens Vaginal Moisturizer GEL Place 1 Application vaginally every morning. 08/13/22   Belva Agee, MD      Allergies     Other, Pantoprazole, Zyrtec [cetirizine], Ciprofloxacin, and Nifedipine    Review of Systems   Review of Systems  All other systems reviewed and are negative.   Physical Exam Updated Vital Signs BP 132/62   Pulse 66   Temp 97.7 F (36.5 C) (Oral)   Resp 14   LMP 05/24/1970   SpO2 98%  Physical Exam Vitals and nursing note reviewed.  Constitutional:      Appearance: She is well-developed.  HENT:     Head: Normocephalic and atraumatic.  Cardiovascular:     Rate and Rhythm: Normal rate and regular rhythm.     Heart sounds: No murmur heard. Pulmonary:     Effort: Pulmonary effort is normal. No respiratory distress.     Breath sounds: Normal breath sounds.  Abdominal:     Palpations: Abdomen is soft.     Tenderness: There is no abdominal tenderness. There is no guarding or rebound.  Genitourinary:    General: Normal vulva.  Musculoskeletal:        General: No swelling or tenderness.  Skin:    General: Skin is warm and dry.  Neurological:     Mental Status: She is alert and oriented to person, place, and time.  Psychiatric:        Behavior: Behavior normal.     ED Results / Procedures / Treatments   Labs (all labs ordered are listed, but only abnormal results are displayed)  Labs Reviewed  URINALYSIS, W/ REFLEX TO CULTURE (INFECTION SUSPECTED) - Abnormal; Notable for the following components:      Result Value   Color, Urine STRAW (*)    Specific Gravity, Urine 1.003 (*)    Leukocytes,Ua TRACE (*)    All other components within normal limits  COMPREHENSIVE METABOLIC PANEL - Abnormal; Notable for the following components:   Glucose, Bld 106 (*)    All other components within normal limits  CBC WITH DIFFERENTIAL/PLATELET - Abnormal; Notable for the following components:   WBC 3.8 (*)    All other components within normal limits  LIPASE, BLOOD  TROPONIN I (HIGH SENSITIVITY)  TROPONIN I (HIGH SENSITIVITY)    EKG None  Radiology CT Angio Chest/Abd/Pel for  Dissection W and/or W/WO  Result Date: 03/05/2023 CLINICAL DATA:  Acute aortic syndrome suspected. Severe lower abdominal pain radiating to chest and bilateral shoulders. Burning with urination. EXAM: CT ANGIOGRAPHY CHEST, ABDOMEN AND PELVIS TECHNIQUE: Non-contrast CT of the chest was initially obtained. Multidetector CT imaging through the chest, abdomen and pelvis was performed using the standard protocol during bolus administration of intravenous contrast. Multiplanar reconstructed images and MIPs were obtained and reviewed to evaluate the vascular anatomy. RADIATION DOSE REDUCTION: This exam was performed according to the departmental dose-optimization program which includes automated exposure control, adjustment of the mA and/or kV according to patient size and/or use of iterative reconstruction technique. CONTRAST:  OMNIPAQUE IOHEXOL 350 MG/ML SOLN COMPARISON:  07/10/2022. FINDINGS: CTA CHEST FINDINGS Cardiovascular: The heart is normal in size and there is no pericardial effusion. Coronary artery calcifications are noted. There is atherosclerotic calcification of the aorta without evidence of aneurysm. The pulmonary trunk is normal in caliber. No dissection is seen. Mediastinum/Nodes: No enlarged mediastinal, hilar, or axillary lymph nodes. Thyroid gland, trachea, and esophagus demonstrate no significant findings. There is a small hiatal hernia. Lungs/Pleura: Mild atelectasis or scarring is noted bilaterally. No effusion or pneumothorax. Musculoskeletal: Degenerative changes are present in the thoracic spine. No acute or suspicious osseous abnormality is seen. Review of the MIP images confirms the above findings. CTA ABDOMEN AND PELVIS FINDINGS VASCULAR Aorta: Normal caliber aorta without aneurysm, dissection, vasculitis or significant stenosis. Aortic atherosclerosis. Celiac: Patent without evidence of aneurysm, dissection, vasculitis. There is focal narrowing of the celiac artery at the origin  without evidence of poststenotic aneurysm and may be due to breathing phase. SMA: Patent without evidence of aneurysm, dissection, vasculitis or significant stenosis. Renals: Both renal arteries are patent without evidence of aneurysm, dissection, vasculitis, fibromuscular dysplasia or significant stenosis. IMA: Patent. Inflow: Patent without evidence of aneurysm, dissection, vasculitis or significant stenosis. Veins: No obvious venous abnormality within the limitations of this arterial phase study. Review of the MIP images confirms the above findings. NON-VASCULAR Hepatobiliary: No focal liver abnormality is seen. No gallstones or gallbladder wall thickening. There stable dilatation of the common bile duct measuring 1.1 cm, unchanged from 2022. Pancreas: Unremarkable. No pancreatic ductal dilatation or surrounding inflammatory changes. Spleen: Normal in size without focal abnormality. Adrenals/Urinary Tract: The adrenal glands are within normal limits. Kidneys enhance symmetrically. There is a cyst in the upper pole of the left kidney. Hypodensities are present in the kidneys bilaterally which are too small to further characterize. No renal calculus or hydronephrosis. Bladder is unremarkable. Stomach/Bowel: There is a small hiatal hernia. Stomach is within normal limits. Appendix is not seen. No evidence of bowel wall thickening, distention, or inflammatory changes. No free air or pneumatosis. Scattered diverticula are present along  the colon without evidence of diverticulitis. Moderate amount of retained stool is present in the colon. Lymphatic: No abdominal or pelvic lymphadenopathy. Reproductive: Status post hysterectomy. No adnexal masses. Other: No abdominopelvic ascites. Musculoskeletal: Degenerative changes in the lumbar spine. No acute osseous abnormality. Review of the MIP images confirms the above findings. IMPRESSION: 1. Aortic atherosclerosis without any evidence aneurysm or dissection. 2. Small hiatal  hernia. 3. Stable dilatation of the common bile duct, unchanged from 2022. 4. Moderate amount of retained stool in the colon suggesting constipation. 5. Diverticulosis without diverticulitis. Electronically Signed   By: Thornell Sartorius M.D.   On: 03/05/2023 04:56   DG Chest Port 1 View  Result Date: 03/05/2023 CLINICAL DATA:  Chest pain EXAM: PORTABLE CHEST 1 VIEW COMPARISON:  02/18/2023 FINDINGS: Heart and mediastinal contours are within normal limits. No focal opacities or effusions. No acute bony abnormality. Aortic atherosclerosis. IMPRESSION: No active cardiopulmonary disease. Electronically Signed   By: Charlett Nose M.D.   On: 03/05/2023 03:37    Procedures Procedures    Medications Ordered in ED Medications  iohexol (OMNIPAQUE) 350 MG/ML injection 100 mL (100 mLs Intravenous Contrast Given 03/05/23 0245)    ED Course/ Medical Decision Making/ A&P                                 Medical Decision Making Amount and/or Complexity of Data Reviewed Labs: ordered. Radiology: ordered.  Risk Prescription drug management.   Patient here for evaluation of dysuria and lower abdominal pain that radiates to her chest and bilateral shoulders.  UA is not consistent with UTI.  No significant tenderness on examination.  Given extensive range of her pain, symptoms a CTA dissection was obtained, which is negative for acute dissection or acute abnormality.  Discussed with patient incidental findings of constipation, atherosclerosis.  EKG is without acute ischemic changes and troponins are negative x 2.  No external evidence of genital infection or lesion that would contribute to her dysuria.  Discussed with patient findings of constipation, this may be contributing to her symptoms.  Offered to contact patient's son and discuss findings of studies with him and she declines.  Plan to discharge home with outpatient follow-up and return precautions.        Final Clinical Impression(s) / ED  Diagnoses Final diagnoses:  Dysuria  Constipation, unspecified constipation type    Rx / DC Orders ED Discharge Orders     None         Tilden Fossa, MD 03/05/23 [phone]

## 2023-03-04 NOTE — ED Triage Notes (Signed)
Pt BIB [facility] complaining of burning with urination x 3-4 hours with pain radiating into lower abdomen.

## 2023-03-05 ENCOUNTER — Emergency Department (HOSPITAL_COMMUNITY): Payer: Medicare Other

## 2023-03-05 DIAGNOSIS — K5730 Diverticulosis of large intestine without perforation or abscess without bleeding: Secondary | ICD-10-CM | POA: Diagnosis not present

## 2023-03-05 DIAGNOSIS — R079 Chest pain, unspecified: Secondary | ICD-10-CM | POA: Diagnosis not present

## 2023-03-05 DIAGNOSIS — K838 Other specified diseases of biliary tract: Secondary | ICD-10-CM | POA: Diagnosis not present

## 2023-03-05 DIAGNOSIS — R300 Dysuria: Secondary | ICD-10-CM | POA: Diagnosis not present

## 2023-03-05 DIAGNOSIS — R109 Unspecified abdominal pain: Secondary | ICD-10-CM | POA: Diagnosis not present

## 2023-03-05 DIAGNOSIS — I700 Atherosclerosis of aorta: Secondary | ICD-10-CM | POA: Diagnosis not present

## 2023-03-05 DIAGNOSIS — K5900 Constipation, unspecified: Secondary | ICD-10-CM | POA: Diagnosis not present

## 2023-03-05 LAB — COMPREHENSIVE METABOLIC PANEL
ALT: 19 U/L (ref 0–44)
AST: 24 U/L (ref 15–41)
Albumin: 4.1 g/dL (ref 3.5–5.0)
Alkaline Phosphatase: 70 U/L (ref 38–126)
Anion gap: 14 (ref 5–15)
BUN: 12 mg/dL (ref 8–23)
CO2: 26 mmol/L (ref 22–32)
Calcium: 9.7 mg/dL (ref 8.9–10.3)
Chloride: 100 mmol/L (ref 98–111)
Creatinine, Ser: 0.8 mg/dL (ref 0.44–1.00)
GFR, Estimated: >60 mL/min (ref >60)
Glucose, Bld: 106 mg/dL — ABNORMAL HIGH (ref 70–99)
Potassium: 3.8 mmol/L (ref 3.5–5.1)
Sodium: 140 mmol/L (ref 135–145)
Total Bilirubin: 0.5 mg/dL (ref 0.3–1.2)
Total Protein: 7 g/dL (ref 6.5–8.1)

## 2023-03-05 LAB — CBC WITH DIFFERENTIAL/PLATELET
Abs Immature Granulocytes: 0 10*3/uL (ref 0.00–0.07)
Basophils Absolute: 0 10*3/uL (ref 0.0–0.1)
Basophils Relative: 1 %
Eosinophils Absolute: 0 10*3/uL (ref 0.0–0.5)
Eosinophils Relative: 1 %
HCT: 38.9 % (ref 36.0–46.0)
Hemoglobin: 12.6 g/dL (ref 12.0–15.0)
Immature Granulocytes: 0 %
Lymphocytes Relative: 34 %
Lymphs Abs: 1.3 10*3/uL (ref 0.7–4.0)
MCH: 31 pg (ref 26.0–34.0)
MCHC: 32.4 g/dL (ref 30.0–36.0)
MCV: 95.6 fL (ref 80.0–100.0)
Monocytes Absolute: 0.5 10*3/uL (ref 0.1–1.0)
Monocytes Relative: 12 %
Neutro Abs: 2 10*3/uL (ref 1.7–7.7)
Neutrophils Relative %: 52 %
Platelets: 221 10*3/uL (ref 150–400)
RBC: 4.07 MIL/uL (ref 3.87–5.11)
RDW: 12.7 % (ref 11.5–15.5)
WBC: 3.8 10*3/uL — ABNORMAL LOW (ref 4.0–10.5)
nRBC: 0 % (ref 0.0–0.2)

## 2023-03-05 LAB — URINALYSIS, W/ REFLEX TO CULTURE (INFECTION SUSPECTED)
Bacteria, UA: NONE SEEN
Bilirubin Urine: NEGATIVE
Glucose, UA: NEGATIVE mg/dL
Hgb urine dipstick: NEGATIVE
Ketones, ur: NEGATIVE mg/dL
Nitrite: NEGATIVE
Protein, ur: NEGATIVE mg/dL
Specific Gravity, Urine: 1.003 — ABNORMAL LOW (ref 1.005–1.030)
pH: 7 (ref 5.0–8.0)

## 2023-03-05 LAB — TROPONIN I (HIGH SENSITIVITY)
Troponin I (High Sensitivity): 6 ng/L (ref <18)
Troponin I (High Sensitivity): 4 ng/L (ref <18)

## 2023-03-05 LAB — LIPASE, BLOOD: Lipase: 25 U/L (ref 11–51)

## 2023-03-05 MED ORDER — IOHEXOL 350 MG/ML SOLN
100.0000 mL | Freq: Once | INTRAVENOUS | Status: AC | PRN
  Administered 2023-03-05: 100 mL via INTRAVENOUS

## 2023-03-05 NOTE — ED Notes (Signed)
Pt getting dressed at this time; pt also calling son for discharge.

## 2023-03-05 NOTE — ED Notes (Signed)
Pt provided with AVS.  Education complete; all questions answered.  Pt leaving ED in stable condition at this time via wheelchair with all belongings.  Pt taken to ED lobby to await ride for discharge; states son will be here around 0705-0710.

## 2023-03-07 ENCOUNTER — Telehealth: Payer: Self-pay | Admitting: *Deleted

## 2023-03-07 ENCOUNTER — Ambulatory Visit (INDEPENDENT_AMBULATORY_CARE_PROVIDER_SITE_OTHER): Payer: Medicare Other

## 2023-03-07 VITALS — BP 152/73 | HR 76 | Wt 144.0 lb

## 2023-03-07 DIAGNOSIS — R300 Dysuria: Secondary | ICD-10-CM

## 2023-03-07 LAB — POCT URINALYSIS DIP (DEVICE)
Bilirubin Urine: NEGATIVE
Glucose, UA: NEGATIVE mg/dL
Hgb urine dipstick: NEGATIVE
Ketones, ur: NEGATIVE mg/dL
Nitrite: NEGATIVE
Protein, ur: NEGATIVE mg/dL
Specific Gravity, Urine: 1.015 (ref 1.005–1.030)
Urobilinogen, UA: 0.2 mg/dL (ref 0.0–1.0)
pH: 7.5 (ref 5.0–8.0)

## 2023-03-07 MED ORDER — CEFADROXIL 500 MG/5ML PO SUSR
500.0000 mg | Freq: Two times a day (BID) | ORAL | 0 refills | Status: AC
Start: 2023-03-07 — End: 2023-03-14

## 2023-03-07 NOTE — Progress Notes (Signed)
Patient here for urinary frequency and burning sensation with urination. Urine sample obtained and cultures sent. Notified patient that the office will call with results that needs treatment. Urinalysis was done and showed small leukocytes. BP was elevated at 152/70. Recheck BP 10 minutes later was 152/73. Patient advised to schedule an appointment with PCP regarding her BP. Patient reports that she feels "miserable" which is why it is elevated. Patient reports taking BP at home which is normally 130s/70s.   1610 Called patient and verified patient by full name and date of birth. Informed patient that Dr. Alvester Morin prescribed patient antibiotics which was sent to her pharmacy for pick up. Patient informed that the medication will not be ready until tomorrow. Educated patient to take medication every 12 hours for 7 days once she gets it. Informed patient that her urine culture is still pending. Patient verbalized understanding and all questions were answered.  Marcelino Duster, RN

## 2023-03-07 NOTE — Telephone Encounter (Signed)
[name] [name] called and left a message on nurse line this am stating she needs to talk with someone. She states she is expecting nurse to call her. States she thinks she has another kidney infection. States it is burning bad and wants to know if doctor can approve a refill from her UTI in May with Dr. Vergie Living. States she is 3 and needs relief. From chart see that she went to ER 10/12 and ua was done.  Patient also called front desk again before nurse called her. I asked front desk to schedule appointment for nurse visit for ua for anytime today patient can come.  I spoke with Lucendia Herrlich and she confirms no meds given from ER and symtoms are worse with severe burning pain when she urinates and pain.  I explained we need to get a urine first and send for culture to be sure we are treating her correctly, but there are meds we can give her once we get a urine. She voices understanding. [name]

## 2023-03-10 LAB — URINE CULTURE

## 2023-03-10 MED ORDER — PHENAZOPYRIDINE HCL 200 MG PO TABS
200.0000 mg | ORAL_TABLET | Freq: Three times a day (TID) | ORAL | 0 refills | Status: AC
Start: 2023-03-10 — End: 2023-03-12

## 2023-03-10 NOTE — Addendum Note (Signed)
Addended byQuintella Reichert on: 03/10/2023 02:34 PM   Modules accepted: Orders

## 2023-03-10 NOTE — Progress Notes (Deleted)
03/10/2023 Patient came back to office stating she was afraid to take the prescribed antibiotics for possible UTI and would like to get the antibiotic (fosfomycin) she was previously prescribed back in May 2024 for UTI. Informed patient that we cannot prescribe the previous antibiotic she received since the urine cultures has not resulted back yet and we do not want her to build any resistance to the antibiotic. Reassured patient that the antibiotic is a broad spectrum antibiotic that is typically safe to take but we can prescribe Pyridium to help relieve symptoms. Patient states she still endorses burning and frequent urination. Patient would like to hold off on taking the prescribed antibiotics until urine cultures come back.  Pyridium orders sent to patient's pharmacy based on protocol.  Marcelino Duster, RN

## 2023-03-10 NOTE — Progress Notes (Signed)
Patient came back to office stating she was afraid to take the prescribed antibiotics for possible UTI and would like to get the antibiotic (fosfomycin) she was previously prescribed back in May 2024 for UTI. Informed patient that we cannot prescribe the previous antibiotic she received since the urine cultures has not resulted back yet and we do not want her to build any resistance to the antibiotic. Reassured patient that the antibiotic is a broad spectrum antibiotic that is typically safe to take but we can prescribe Pyridium to help relieve symptoms. Patient states she still endorses burning and frequent urination. Patient would like to hold off on taking the prescribed antibiotics until urine cultures come back.  Pyridium orders sent to patient's pharmacy based on protocol.   Marcelino Duster, RN

## 2023-03-11 ENCOUNTER — Telehealth: Payer: Self-pay

## 2023-03-11 DIAGNOSIS — R300 Dysuria: Secondary | ICD-10-CM

## 2023-03-11 MED ORDER — FOSFOMYCIN TROMETHAMINE 3 G PO PACK: 3.0000 g | PACK | Freq: Once | ORAL | 0 refills | Status: AC

## 2023-03-11 MED ORDER — NITROFURANTOIN MONOHYD MACRO 100 MG PO CAPS
100.0000 mg | ORAL_CAPSULE | Freq: Two times a day (BID) | ORAL | 0 refills | Status: DC
Start: 2023-03-11 — End: 2023-03-28

## 2023-03-11 NOTE — Telephone Encounter (Signed)
-----   Message from Federico Flake sent at 03/11/2023 10:56 AM EDT ----- Regarding: Check in with patient This patient needs to be directed to their PCP or the hospital. I dont know why this provider got the call but we are also not the right place for her care if she had systemic symptoms. ----- Message ----- From: Alfredo Martinez, MD Sent: 03/11/2023   7:04 AM EDT To: Federico Flake, MD  Hey Dr. Alvester Morin,   I got an after hours call for this patient (although she does not come to our clinic) and we spoke briefly. She has the UTI symptoms, for which I believe you saw her and gave antibiotic/pyridium. She was tachy when they called to about 110, dizzy, and light headed. I instructed them to be seen by Healthcare provider this AM given her symptoms and that I would updated the doc that saw her. I see the her sensitivities are back and resistant to a few things.  Thank you! Allee Geophysical data processor

## 2023-03-11 NOTE — Telephone Encounter (Signed)
Returned call to patient regarding her concerns about the results of her urine culture. Patient would like to be prescribed antibiotic based on results. Followed up with Dr. Crissie Reese. Per physician, order fosfomycin packet once and Macrobid 100 mg capsule every 12 hours for 5 days. Orders placed per physician. Informed patient to pick up at her pharmacy. Patient verbalized understanding and all questions were answered.   Marcelino Duster, RN

## 2023-03-28 ENCOUNTER — Ambulatory Visit (INDEPENDENT_AMBULATORY_CARE_PROVIDER_SITE_OTHER): Payer: Medicare Other | Admitting: Obstetrics and Gynecology

## 2023-03-28 ENCOUNTER — Other Ambulatory Visit: Payer: Self-pay

## 2023-03-28 ENCOUNTER — Encounter: Payer: Self-pay | Admitting: Obstetrics and Gynecology

## 2023-03-28 VITALS — BP 139/80 | HR 75 | Ht 64.0 in | Wt 146.0 lb

## 2023-03-28 DIAGNOSIS — N3000 Acute cystitis without hematuria: Secondary | ICD-10-CM

## 2023-03-28 NOTE — Progress Notes (Signed)
GYNECOLOGY OFFICE NOTE  History:  89 y.o. G2P2 here today for follow up for UTI. Feels like the antibiotics has worked, she is no longer having burning. Is feeling some irritation at the urethral opening but has started using replense and estrogen cream and feels like that is helping with some dryness that she has been having.    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   Vitamin D deficiency    2010    Past Surgical History:  Procedure Laterality Date   A&P, enterocele repair     s/p 2003   ABDOMINAL HYSTERECTOMY     BIOPSY  02/05/2020   Procedure: BIOPSY;  Surgeon: Tressia Danas, MD;  Location: [facility] ENDOSCOPY;  Service: Gastroenterology;;   BREAST REDUCTION SURGERY     s/p 1996      PATIENT DENIES BREAST REDUCTION    ESOPHAGEAL DILATION  02/05/2020   Procedure: ESOPHAGEAL DILATION;  Surgeon: Tressia Danas, MD;  Location: [facility] ENDOSCOPY;  Service: Gastroenterology;;   ESOPHAGEAL MANOMETRY N/A 05/07/2020   Procedure: ESOPHAGEAL MANOMETRY (EM);  Surgeon: Rachael Fee, MD;  Location: [facility] ENDOSCOPY;  Service: Endoscopy;  Laterality: N/A;    ESOPHAGOGASTRODUODENOSCOPY (EGD) WITH PROPOFOL N/A 02/05/2020   Procedure: ESOPHAGOGASTRODUODENOSCOPY (EGD) WITH PROPOFOL;  Surgeon: Tressia Danas, MD;  Location: [facility] ENDOSCOPY;  Service: Gastroenterology;  Laterality: N/A;   HEMORRHOID SURGERY     LEFT HEART CATH AND CORONARY ANGIOGRAPHY N/A 05/25/2017   Procedure: LEFT HEART CATH AND CORONARY ANGIOGRAPHY;  Surgeon: Corky Crafts, MD;  Location: [facility] INVASIVE CV LAB;  Service: Cardiovascular;  Laterality: N/A;     Current Outpatient Medications:    busPIRone (BUSPAR) 5 MG tablet, TAKE 1 TABLET TWICE A DAY, Disp: 180 tablet, Rfl: 3   flecainide (TAMBOCOR) 50 MG tablet, TAKE 2 TABLETS EVERY MORNING AND 1 TABLET AT NIGHT, Disp: 270 tablet, Rfl: 3   Replens Vaginal Moisturizer GEL, Place 1 Application vaginally every morning., Disp: 6.7 g, Rfl: 1  The following portions of the patient's history were reviewed and updated as appropriate: allergies, current medications, past family history, past medical history, past social history, past surgical history and problem list.   Review of Systems:  Pertinent items noted in HPI and remainder of comprehensive ROS otherwise negative.   Objective:  Physical Exam BP 139/80   Pulse 75   Ht 5\' 4"  (1.626 m)   Wt 146 lb (66.2 kg)   LMP 05/24/1970   BMI 25.06 kg/m  CONSTITUTIONAL: Well-developed,  well-nourished female in no acute distress.  HENT:  Normocephalic, atraumatic. External right and left ear normal. Mask in place. EYES: Conjunctivae and EOM are normal. Pupils are equal, round, and reactive to light. No scleral icterus.  NECK: Normal range of motion, supple, no masses SKIN: Skin is warm and dry. No rash noted. Not diaphoretic. No erythema. No pallor. NEUROLOGIC: Alert and oriented to person, place, and time. Normal reflexes, muscle tone coordination. No cranial nerve deficit noted. PSYCHIATRIC: Normal mood and affect. Normal behavior. Normal judgment and thought content. CARDIOVASCULAR:  Normal heart rate noted RESPIRATORY: Effort normal, no problems with respiration noted ABDOMEN: deferred   PELVIC: deferred MUSCULOSKELETAL: Normal range of motion. No edema noted.   Assessment & Plan:   1. Acute cystitis without hematuria UA normal Cont replense and estrogen cream Return with any UTI symptoms   Routine preventative health maintenance measures emphasized. Please refer to After Visit Summary for other counseling recommendations.   Return if symptoms worsen or fail to improve.   Baldemar Lenis, MD, [facility] Attending Center for [facility])

## 2023-03-29 LAB — POCT URINALYSIS DIP (DEVICE)
Bilirubin Urine: NEGATIVE
Glucose, UA: NEGATIVE mg/dL
Hgb urine dipstick: NEGATIVE
Ketones, ur: NEGATIVE mg/dL
Leukocytes,Ua: NEGATIVE
Nitrite: NEGATIVE
Protein, ur: NEGATIVE mg/dL
Specific Gravity, Urine: 1.025 (ref 1.005–1.030)
Urobilinogen, UA: 0.2 mg/dL (ref 0.0–1.0)
pH: 7 (ref 5.0–8.0)

## 2023-04-09 ENCOUNTER — Emergency Department (HOSPITAL_COMMUNITY)
Admission: EM | Admit: 2023-04-09 | Discharge: 2023-04-09 | Disposition: A | Discharge status: Home / Self Care | Payer: Medicare Other | Attending: Emergency Medicine | Admitting: Emergency Medicine

## 2023-04-09 ENCOUNTER — Emergency Department (HOSPITAL_COMMUNITY): Payer: Medicare Other

## 2023-04-09 ENCOUNTER — Other Ambulatory Visit: Payer: Self-pay

## 2023-04-09 DIAGNOSIS — I4430 Unspecified atrioventricular block: Secondary | ICD-10-CM | POA: Diagnosis not present

## 2023-04-09 DIAGNOSIS — I10 Essential (primary) hypertension: Secondary | ICD-10-CM | POA: Diagnosis not present

## 2023-04-09 DIAGNOSIS — R002 Palpitations: Secondary | ICD-10-CM | POA: Diagnosis not present

## 2023-04-09 DIAGNOSIS — Z87891 Personal history of nicotine dependence: Secondary | ICD-10-CM | POA: Insufficient documentation

## 2023-04-09 DIAGNOSIS — M79604 Pain in right leg: Secondary | ICD-10-CM | POA: Diagnosis not present

## 2023-04-09 DIAGNOSIS — R42 Dizziness and giddiness: Secondary | ICD-10-CM | POA: Diagnosis not present

## 2023-04-09 DIAGNOSIS — R0602 Shortness of breath: Secondary | ICD-10-CM | POA: Diagnosis not present

## 2023-04-09 DIAGNOSIS — I491 Atrial premature depolarization: Secondary | ICD-10-CM | POA: Diagnosis not present

## 2023-04-09 LAB — D-DIMER, QUANTITATIVE: D-Dimer, Quant: 0.49 ug{FEU}/mL (ref 0.00–0.50)

## 2023-04-09 LAB — BASIC METABOLIC PANEL
Anion gap: 11 (ref 5–15)
BUN: 11 mg/dL (ref 8–23)
CO2: 21 mmol/L — ABNORMAL LOW (ref 22–32)
Calcium: 9.2 mg/dL (ref 8.9–10.3)
Chloride: 106 mmol/L (ref 98–111)
Creatinine, Ser: 0.63 mg/dL (ref 0.44–1.00)
GFR, Estimated: >60 mL/min (ref >60)
Glucose, Bld: 92 mg/dL (ref 70–99)
Potassium: 3.8 mmol/L (ref 3.5–5.1)
Sodium: 138 mmol/L (ref 135–145)

## 2023-04-09 LAB — CBC
HCT: 40.2 % (ref 36.0–46.0)
Hemoglobin: 13 g/dL (ref 12.0–15.0)
MCH: 31 pg (ref 26.0–34.0)
MCHC: 32.3 g/dL (ref 30.0–36.0)
MCV: 95.7 fL (ref 80.0–100.0)
Platelets: 215 10*3/uL (ref 150–400)
RBC: 4.2 MIL/uL (ref 3.87–5.11)
RDW: 13.1 % (ref 11.5–15.5)
WBC: 4 10*3/uL (ref 4.0–10.5)
nRBC: 0 % (ref 0.0–0.2)

## 2023-04-09 LAB — TROPONIN I (HIGH SENSITIVITY): Troponin I (High Sensitivity): 5 ng/L (ref <18)

## 2023-04-09 NOTE — ED Notes (Signed)
Discharge instructions provided by edp were discussed with pt. Pt verbalized understanding with no additional questions at this time. Pt called son for ride home.

## 2023-04-09 NOTE — ED Provider Notes (Signed)
[facility]-EMERGENCY DEPT [facility] Emergency Department Provider Note MRN:  [id number]  Arrival date & time: 04/09/23     Chief Complaint   Palpitations   History of Present Illness   [name] [name] is a 89 y.o. year-old female with no pertinent past medical history presenting to the ED with chief complaint of palpitations.  Palpitations while waking up this morning associated with shortness of breath.  Has been having some right leg swelling and pain recently.  Denies chest pain.  Review of Systems  A thorough review of systems was obtained and all systems are negative except as noted in the HPI and PMH.   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   Vitamin D deficiency    2010    Past Surgical History:  Procedure Laterality Date   A&P, enterocele repair     s/p 2003   ABDOMINAL HYSTERECTOMY     BIOPSY  02/05/2020   Procedure: BIOPSY;  Surgeon: Tressia Danas, MD;  Location: [facility] ENDOSCOPY;  Service: Gastroenterology;;   BREAST REDUCTION SURGERY     s/p 1996      PATIENT DENIES BREAST REDUCTION    ESOPHAGEAL DILATION   02/05/2020   Procedure: ESOPHAGEAL DILATION;  Surgeon: Tressia Danas, MD;  Location[facility] ENDOSCOPY;  Service: Gastroenterology;;   ESOPHAGEAL MANOMETRY N/A 05/07/2020   Procedure: ESOPHAGEAL MANOMETRY (EM);  Surgeon: Rachael Fee, MD;  Location: [facility] ENDOSCOPY;  Service: Endoscopy;  Laterality: N/A;   ESOPHAGOGASTRODUODENOSCOPY (EGD) WITH PROPOFOL N/A 02/05/2020   Procedure: ESOPHAGOGASTRODUODENOSCOPY (EGD) WITH PROPOFOL;  Surgeon: Tressia Danas, MD;  Location: [facility] ENDOSCOPY;  Service: Gastroenterology;  Laterality: N/A;   HEMORRHOID SURGERY     LEFT HEART CATH AND CORONARY ANGIOGRAPHY N/A 05/25/2017   Procedure: LEFT HEART CATH AND CORONARY ANGIOGRAPHY;  Surgeon: Corky Crafts, MD;  Location: [facility] INVASIVE CV LAB;  Service: Cardiovascular;  Laterality: N/A;    Family History  Problem Relation Age of Onset   Stroke Father        @ 61   Hypertension Father    Hypertension Sister    Cancer Sister 67       brain   Breast cancer Other 60       breast   Breast cancer Daughter 70       spread to her brain   Cancer Other 87       colon/MELANOMA   Stroke Mother  medication induced   Kidney disease Brother    Cancer Brother     Social History   Socioeconomic History   Marital status: Widowed    Spouse name: Not on file   Number of children: 2   Years of education: Not on file   Highest education level: Not on file  Occupational History   Occupation: Retired  Tobacco Use   Smoking status: Former    Current packs/day: 0.00    Types: Cigarettes    Quit date: 05/24/1968    Years since quitting: 54.9   Smokeless tobacco: Never   Tobacco comments:    only smoked 7 years  Vaping Use   Vaping status: Never Used  Substance and Sexual Activity   Alcohol use: No    Alcohol/week: 0.0 standard drinks of alcohol   Drug use: No   Sexual activity: Never    Comment: 1st intercourse 48 yo-1 partner  Other Topics Concern   Not on file  Social History Narrative   Lives  alone   Social Determinants of Health   Financial Resource Strain: Low Risk  (01/19/2023)   Overall Financial Resource Strain (CARDIA)    Difficulty of Paying Living Expenses: Not hard at all  Food Insecurity: No Food Insecurity (01/19/2023)   Hunger Vital Sign    Worried About Running Out of Food in the Last Year: Never true    Ran Out of Food in the Last Year: Never true  Transportation Needs: No Transportation Needs (01/19/2023)   PRAPARE - Administrator, Civil Service (Medical): No    Lack of Transportation (Non-Medical): No  Physical Activity: Insufficiently Active (01/19/2023)   Exercise Vital Sign    Days of Exercise per Week: 3 days    Minutes of Exercise per Session: 20 min  Stress: No Stress Concern Present (01/19/2023)   [employer] of Occupational Health - Occupational Stress Questionnaire    Feeling of Stress : Not at all  Social Connections: Socially Isolated (01/19/2023)   Social Connection and Isolation Panel [NHANES]    Frequency of Communication with Friends and Family: Never    Frequency of Social Gatherings with Friends and Family: More than three times a week    Attends Religious Services: Never    Database administrator or Organizations: No    Attends Banker Meetings: Never    Marital Status: Widowed  Intimate Partner Violence: Patient Unable To Answer (01/19/2023)   Humiliation, Afraid, Rape, and Kick questionnaire    Fear of Current or Ex-Partner: Patient unable to answer    Emotionally Abused: Patient unable to answer    Physically Abused: Patient unable to answer    Sexually Abused: Patient unable to answer     Physical Exam   Vitals:   04/09/23 0630 04/09/23 0645  BP: (!) 148/74 135/76  Pulse: 77 81  Resp: 12 14  Temp:    SpO2: 100% 100%    CONSTITUTIONAL: Well-appearing, NAD NEURO/PSYCH:  Alert and oriented x 3, no focal deficits EYES:  eyes equal and reactive ENT/NECK:  no LAD, no JVD CARDIO: Regular rate,  well-perfused, normal S1 and S2 PULM:  CTAB no wheezing or rhonchi GI/GU:  non-distended, non-tender MSK/SPINE:  No gross deformities, no edema SKIN:  no rash, atraumatic   *Additional and/or pertinent findings included in MDM below  Diagnostic and Interventional Summary    EKG Interpretation Date/Time:  04-09-2023 at 04:34:07 Ventricular Rate:   76 PR Interval:   220 QRS  Duration:   96 QT Interval:   384 QTC Calculation:  432 R Axis:      Text Interpretation: Sinus rhythm       Labs Reviewed  BASIC METABOLIC PANEL - Abnormal; Notable for the following components:      Result Value   CO2 21 (*)    All other components within normal limits  CBC  D-DIMER, QUANTITATIVE (NOT AT [facility])  TROPONIN I (HIGH SENSITIVITY)  TROPONIN I (HIGH SENSITIVITY)    DG Chest Port 1 View  Final Result      Medications - No data to display   Procedures  /  Critical Care Procedures  ED Course and Medical Decision Making  Initial Impression and Ddx Swelling and tenderness and some mild bruising to the medial right knee, question recent trauma.  DVT PE also considered.  Past medical/surgical history that increases complexity of ED encounter: Paroxysmal SVT  Interpretation of Diagnostics I personally reviewed the EKG and my interpretation is as follows: Sinus rhythm  Labs reassuring with no significant blood count or electrolyte disturbance.  Troponin negative.  D-dimer negative.  Patient Reassessment and Ultimate Disposition/Management     Patient had some brief episodes of ectopy during cardiac monitoring.  At one point was exhibiting atrial bigeminy with a pulse rate in the 40s.  Despite this patient asymptomatic and tolerating this rate well and it lasted very briefly.  With reassuring workup and patient well-appearing with no symptoms at this time, no real benefit to admission, appropriate for discharge with cardiology follow-up.  Patient management required discussion with the  following services or consulting groups:  None  Complexity of Problems Addressed Acute illness or injury that poses threat of life of bodily function  Additional Data Reviewed and Analyzed Further history obtained from: Prior labs/imaging results  Additional Factors Impacting ED Encounter Risk Consideration of hospitalization  Elmer Sow. Pilar Plate, MD [facility] Emergency Medicine [facility] [email or web address]  Final Clinical Impressions(s) / ED Diagnoses     ICD-10-CM   1. Palpitations  R00.2       ED Discharge Orders     None        Discharge Instructions Discussed with and Provided to Patient:     Discharge Instructions      You were evaluated in the Emergency Department and after careful evaluation, we did not find any emergent condition requiring admission or further testing in the hospital.  Your exam/testing today is overall reassuring.  Follow-up with Dr. Ladona Ridgel to discuss your symptoms.  Please return to the Emergency Department if you experience any worsening of your condition.   Thank you for allowing [place] to be a part of your care.       Sabas Sous, MD 04/09/23 [phone]

## 2023-04-09 NOTE — ED Notes (Signed)
Edp informed pt having irregular rhythms with PVCs. RN validated VS. Unable to get EKG. EDP at bedside

## 2023-04-09 NOTE — ED Notes (Signed)
Pt assisted to bedside commode

## 2023-04-09 NOTE — ED Triage Notes (Signed)
Pt BIB [facility] from home. Sts she was awaken from her sleep s/t palpitations associated with SOB

## 2023-04-09 NOTE — ED Notes (Signed)
CCM called to place pt on cardiac monitor

## 2023-04-09 NOTE — ED Notes (Signed)
I just called son to confirm he is cominig.  He is leaving and will be here in about 15 minutes.

## 2023-04-09 NOTE — Discharge Instructions (Addendum)
You were evaluated in the Emergency Department and after careful evaluation, we did not find any emergent condition requiring admission or further testing in the hospital.  Your exam/testing today is overall reassuring.  Follow-up with Dr. Ladona Ridgel to discuss your symptoms.  Please return to the Emergency Department if you experience any worsening of your condition.   Thank you for allowing [place] to be a part of your care.

## 2023-04-11 ENCOUNTER — Encounter: Payer: Self-pay | Admitting: Internal Medicine

## 2023-04-11 NOTE — Telephone Encounter (Signed)
Patient's son, Clide Cliff, is following up requesting a call back regarding this matter at [phone]

## 2023-04-12 ENCOUNTER — Telehealth: Payer: Self-pay

## 2023-04-12 NOTE — Telephone Encounter (Signed)
Spoke with son Clide Cliff per [place]. Set up Appt with Dr Ladona Ridgel for this coming Friday (04/15/23) Gave Ricky 911/ED precautions incase of incased symptoms.

## 2023-04-12 NOTE — Telephone Encounter (Signed)
Documented in phone note.

## 2023-04-12 NOTE — Telephone Encounter (Signed)
**  Possible re-document. Unclear to where first document is.  Spoke with Pt son Clide Cliff. Appt set with Dr Ladona Ridgel for 04/15/23. ED/911 precautions given for increased symptoms.

## 2023-04-14 ENCOUNTER — Other Ambulatory Visit: Payer: Self-pay

## 2023-04-14 ENCOUNTER — Telehealth: Payer: Self-pay | Admitting: Gastroenterology

## 2023-04-14 MED ORDER — HYOSCYAMINE SULFATE ER 0.375 MG PO TBCR: 0.3750 mg | EXTENDED_RELEASE_TABLET | Freq: Two times a day (BID) | ORAL | 3 refills | Status: DC

## 2023-04-14 NOTE — Telephone Encounter (Signed)
Prescription sent to pharmacy.

## 2023-04-14 NOTE — Telephone Encounter (Signed)
Inbound call from patient' son, would like refill for Hyoscyamine. They were advised patient hasn't been seen in over two years. He stated when Dr. Christella Hartigan was here, he said patient could call back at any time for the refill.

## 2023-04-14 NOTE — Telephone Encounter (Signed)
Yes that's fine, if she is on it chronically and stable, can refill it. Thanks

## 2023-04-14 NOTE — Telephone Encounter (Signed)
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 Pts son is calling to get a refill on her hyocyamine and states Dr. Christella Hartigan told them all they had to do was call to get a refill. Please advise if ok to refill for pt as DOD.

## 2023-04-15 ENCOUNTER — Ambulatory Visit (INDEPENDENT_AMBULATORY_CARE_PROVIDER_SITE_OTHER): Payer: Medicare Other

## 2023-04-15 ENCOUNTER — Ambulatory Visit: Payer: Medicare Other | Attending: Internal Medicine | Admitting: Internal Medicine

## 2023-04-15 ENCOUNTER — Encounter: Payer: Self-pay | Admitting: Internal Medicine

## 2023-04-15 VITALS — BP 152/80 | HR 81 | Ht 64.0 in | Wt 143.0 lb

## 2023-04-15 DIAGNOSIS — I491 Atrial premature depolarization: Secondary | ICD-10-CM | POA: Insufficient documentation

## 2023-04-15 NOTE — Progress Notes (Unsigned)
Applied a 3 day Zio XT monitor to patient in the office 

## 2023-04-15 NOTE — Patient Instructions (Addendum)
Medication Instructions:  Your physician recommends that you continue on your current medications as directed. Please refer to the Current Medication list given to you today.  *If you need a refill on your cardiac medications before your next appointment, please call your pharmacy*  Lab Work: None ordered.  If you have labs (blood work) drawn today and your tests are completely normal, you will receive your results only by: MyChart Message (if you have MyChart) OR A paper copy in the mail If you have any lab test that is abnormal or we need to change your treatment, we will call you to review the results.  Testing/Procedures: Your physician has requested that you wear a Zio heart monitor for 3 days. This will be mailed to your home with instructions on how to apply the monitor and how to return it when finished. Please allow 2 weeks after returning the heart monitor before our office calls you with the results.   Follow-Up: At [facility], you and your health needs are our priority.  As part of our continuing mission to provide you with exceptional heart care, we have created designated Provider Care Teams.  These Care Teams include your primary Cardiologist (physician) and Advanced Practice Providers (APPs -  Physician Assistants and Nurse Practitioners) who all work together to provide you with the care you need, when you need it.   Your next appointment:   To be determined base on testing  The format for your next appointment:   In Person  Provider:   Lewayne Bunting, MD{or one of the following Advanced Practice Providers on your designated Care Team:   Francis Dowse, [place] Casimiro Needle "Mardelle Matte" [place], [place] Earnest Rosier, NP    Important Information About Sugar

## 2023-04-15 NOTE — Progress Notes (Signed)
HPI [name] [name] returns today for followup. She has a h/o PAF and palpitations. She also has a h/o chronic aphagia. She also has esophageal spasm. She has not had palpitations. She noted that she was very ill back in February. She eventually improved. She was found to have a UTI. She has become more sedentary but has not fallen.   Allergies  Allergen Reactions   Other Anaphylaxis    Reaction to GI Cocktail   Pantoprazole Other (See Comments)    Upset stomach and weight loss   Zyrtec [Cetirizine] Palpitations   Ciprofloxacin     Rash with burning all over body   Nifedipine     Caused low BP     Current Outpatient Medications  Medication Sig Dispense Refill   busPIRone (BUSPAR) 5 MG tablet TAKE 1 TABLET TWICE A DAY 180 tablet 3   flecainide (TAMBOCOR) 50 MG tablet TAKE 2 TABLETS EVERY MORNING AND 1 TABLET AT NIGHT 270 tablet 3   Hyoscyamine Sulfate 0.375 MG TBCR Take 1 tablet (0.375 mg total) by mouth 2 (two) times daily. 90 tablet 3   Replens Vaginal Moisturizer GEL Place 1 Application vaginally every morning. 6.7 g 1   No current facility-administered medications for this visit.     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   Skin cancer of arm 09/26/2014   Splenic artery aneurysm (HCC)  04/02/2021   Stricture esophagus    TMJ pain dysfunction syndrome 09/25/2018   Vitamin D deficiency    2010    ROS:   All systems reviewed and negative except as noted in the HPI.   Past Surgical History:  Procedure Laterality Date   A&P, enterocele repair     s/p 2003   ABDOMINAL HYSTERECTOMY     BIOPSY  02/05/2020   Procedure: BIOPSY;  Surgeon: Tressia Danas, MD;  Location: [facility] ENDOSCOPY;  Service: Gastroenterology;;   BREAST REDUCTION SURGERY     s/p 1996      PATIENT DENIES BREAST REDUCTION    ESOPHAGEAL DILATION  02/05/2020   Procedure: ESOPHAGEAL DILATION;  Surgeon: Tressia Danas, MD;  Location: [facility] ENDOSCOPY;  Service: Gastroenterology;;   ESOPHAGEAL MANOMETRY N/A 05/07/2020   Procedure: ESOPHAGEAL MANOMETRY (EM);  Surgeon: Rachael Fee, MD;  Location: [facility] ENDOSCOPY;  Service: Endoscopy;  Laterality: N/A;   ESOPHAGOGASTRODUODENOSCOPY (EGD) WITH PROPOFOL N/A 02/05/2020   Procedure: ESOPHAGOGASTRODUODENOSCOPY (EGD) WITH PROPOFOL;  Surgeon: Tressia Danas, MD;  Location: [facility] ENDOSCOPY;  Service: Gastroenterology;  Laterality: N/A;   HEMORRHOID SURGERY     LEFT HEART CATH AND CORONARY ANGIOGRAPHY N/A 05/25/2017   Procedure: LEFT HEART  CATH AND CORONARY ANGIOGRAPHY;  Surgeon: Corky Crafts, MD;  Location: [facility] INVASIVE CV LAB;  Service: Cardiovascular;  Laterality: N/A;     Family History  Problem Relation Age of Onset   Stroke Father        @ 52   Hypertension Father    Hypertension Sister    Cancer Sister 64       brain   Breast cancer Other 21       breast   Breast cancer Daughter 20       spread to her brain   Cancer Other 38       colon/MELANOMA   Stroke Mother        medication induced   Kidney disease Brother    Cancer Brother      Social History   Socioeconomic History   Marital status: Widowed    Spouse name: Not on file   Number of children: 2   Years of education: Not on file   Highest education level: Not on file  Occupational  History   Occupation: Retired  Tobacco Use   Smoking status: Former    Current packs/day: 0.00    Types: Cigarettes    Quit date: 05/24/1968    Years since quitting: 54.9   Smokeless tobacco: Never   Tobacco comments:    only smoked 7 years  Vaping Use   Vaping status: Never Used  Substance and Sexual Activity   Alcohol use: No    Alcohol/week: 0.0 standard drinks of alcohol   Drug use: No   Sexual activity: Never    Comment: 1st intercourse 33 yo-1 partner  Other Topics Concern   Not on file  Social History Narrative   Lives alone   Social Determinants of Health   Financial Resource Strain: Low Risk  (01/19/2023)   Overall Financial Resource Strain (CARDIA)    Difficulty of Paying Living Expenses: Not hard at all  Food Insecurity: No Food Insecurity (01/19/2023)   Hunger Vital Sign    Worried About Running Out of Food in the Last Year: Never true    Ran Out of Food in the Last Year: Never true  Transportation Needs: No Transportation Needs (01/19/2023)   PRAPARE - Administrator, Civil Service (Medical): No    Lack of Transportation (Non-Medical): No  Physical Activity: Insufficiently Active (01/19/2023)   Exercise Vital Sign    Days of Exercise per Week: 3 days    Minutes of Exercise per Session: 20 min  Stress: No Stress Concern Present (01/19/2023)   [employer] of Occupational Health - Occupational Stress Questionnaire    Feeling of Stress : Not at all  Social Connections: Socially Isolated (01/19/2023)   Social Connection and Isolation Panel [NHANES]    Frequency of Communication with Friends and Family: Never    Frequency of Social Gatherings with Friends and Family: More than three times a week    Attends Religious Services: Never    Database administrator or Organizations: No    Attends Banker Meetings: Never    Marital Status: Widowed  Intimate Partner Violence: Patient Unable To Answer (01/19/2023)   Humiliation, Afraid, Rape, and  Kick questionnaire    Fear of Current or Ex-Partner: Patient unable to answer    Emotionally Abused: Patient unable to answer    Physically Abused: Patient unable to answer    Sexually Abused: Patient unable to answer     BP (!) 152/80  Pulse 81   Ht 5\' 4"  (1.626 m)   Wt 143 lb (64.9 kg)   LMP 05/24/1970   SpO2 98%   BMI 24.55 kg/m   Physical Exam:  Well appearing NAD HEENT: Unremarkable Neck:  No JVD, no thyromegally Lymphatics:  No adenopathy Back:  No CVA tenderness Lungs:  Clear HEART:  Regular rate rhythm, no murmurs, no rubs, no clicks Abd:  soft, positive bowel sounds, no organomegally, no rebound, no guarding Ext:  2 plus pulses, no edema, no cyanosis, no clubbing Skin:  No rashes no nodules Neuro:  CN II through XII intact, motor grossly intact  EKG  DEVICE  Normal device function.  See PaceArt for details.   Assess/Plan:  PAC's/PAF - she is doing well and will continue her flecainide which she has been taking for over 20 years.  HTN - her bp is elevated. However she has had trouble with low bp in the past. She may require additional bp lowering. Bradycardia - the patient has had HR's in the 40's and 140's. I have asked her to obtain a 3 day zio to confirm symptomatic tachy-brady and if this is the case, PPM will be recommended.  Loman Chroman Nissan Frazzini,MD

## 2023-04-15 NOTE — Telephone Encounter (Signed)
Patient Son called and stated that they no longer use [facility] and would like her medicine Hyocyamine to [facility] on [address]. Please advise.

## 2023-04-18 MED ORDER — HYOSCYAMINE SULFATE ER 0.375 MG PO TBCR: 0.3750 mg | EXTENDED_RELEASE_TABLET | Freq: Two times a day (BID) | ORAL | 3 refills | Status: DC

## 2023-04-18 NOTE — Telephone Encounter (Signed)
Script sent to [facility]

## 2023-04-18 NOTE — Addendum Note (Signed)
Addended by: Cooper Render on: 04/18/2023 08:58 AM   Modules accepted: Orders

## 2023-04-19 NOTE — Telephone Encounter (Signed)
Spoke with patient's son [name] [name] and made him aware that hyoscyamine was sent to [facility] pharmacy and not [facility] as requested.   Advised [name] [name] that they medication should be ready at [facility] and if they have any further questions or issues to give our office a call.  Patient's son verbalized understanding.  No further questions.

## 2023-04-19 NOTE — Telephone Encounter (Signed)
Inbound call from patient requesting a call to discuss medication. States she was previously on a medication that was working for her. States [facility] pharmacy requested a refill but has not heard anything back. States to leave her a message if she is unable to come to the phone. Please advise, thank you.

## 2023-04-25 DIAGNOSIS — I491 Atrial premature depolarization: Secondary | ICD-10-CM | POA: Diagnosis not present

## 2023-04-29 ENCOUNTER — Telehealth: Payer: Self-pay

## 2023-04-29 NOTE — Telephone Encounter (Signed)
Transition Care Management Unsuccessful Follow-up Telephone Call  Date of discharge and from where:  04/09/2023 [facility]  Attempts:  1st Attempt  Reason for unsuccessful TCM follow-up call:  Voice mail full  [name] [name] [facility]  [facility], [facility] Resource Care Guide Direct Dial: [phone]  Website: [email or web address]

## 2023-05-06 ENCOUNTER — Telehealth: Payer: Self-pay | Admitting: Internal Medicine

## 2023-05-06 NOTE — Telephone Encounter (Signed)
Returned pts call. Let pt know results. All questions answered if asked at that time.

## 2023-05-06 NOTE — Telephone Encounter (Signed)
Patient is returning call in regards to heart monitor results. Please advise.

## 2023-06-08 NOTE — Progress Notes (Signed)
[facility] Return Visit   Referring Provider Kathleen Lime, MD [address] [place],  [place] [address]  Primary Care Provider Kathleen Lime, MD  Patient Profile: [name] [name] is a 89 y.o. female with a past medical history noteworthy for paroxysmal SVT, HTN, IDA, vitamin D deficiency, who returns to the [facility] for follow-up of the problem(s) noted below.  Problem List: Nutcracker/jackhammer esophagus diagnosed 2021 Epigastric abdominal pain   History of Present Illness   [name] [name] was last seen in the GI office 07/22/2020 by Dr. Christella Hartigan -I reviewed previous GI notes  Previous GI History Dysphagia, weight loss:  - 12/2019: UGI series: Moderate presbyesophagus.  Small prox duodenal tic.  No HH or GERD.    - 12/2019 MBSS: limited due to pt reticence to participate.   "functional oropharyngeal swallow ability with no aspiration or deep laryngeal penetration of any consistency tested. Trace laryngeal penetration on swallow of thin via straw is [facility] for age. In addition, no pharyngeal residuals post-swallow.  Minimal difficulty orally transiting pudding bolus with appearance of near gagging but with encouragement pt able to propel posterior and swallow.  No oral issues with cracker, nectar or thin. SLP oral holding/near gagging with puree was anxiety driven."  - EGD September 2021 Dr. Orvan Falconer while in hospital showed a normal esophagus that was dilated and then biopsied, also showed mild gastropathy.  The biopsies from these showed no clear sign of etiology.   - High-res esophageal manometry December 2021, normal GE junction, + evidence of nutcracker esophagus.  Trial of nifedipine 10 mg capsules 1 cap 3 times daily.  This caused some lightheadedness, low blood pressure and so she has been relying on hyoscyamine, blender food.   [facility] Second Opinion 2023 w/ Dr. Ruthell Rummage - [place] [facility] manometry reviewed and concurred with diagnosis of jackhammer esophagus -  Agreed with ongoing use of hyoscyamine; risks outweigh benefits of POEM at her age   Current GI Meds  Hyoscyamine 0.375 mg by mouth twice daily as needed for abdominal pain/esophageal symptoms   Interval History  [name] [name] presents to the office today alone.  Records indicate that her son contacted the office in fall 2024 stating a refill of hyoscyamine for been of jackhammer esophagus as the medication seem to be working for her.  At today's visit she informs me that she is taking hyoscyamine 0.375 mg as needed with good effect She will take hyoscyamine if she begins develop symptoms of pain in her epigastric region Sometimes she can go a couple of months without needing the medication Has some difficulty taking pills and therefore uses a sublingual formulation of hyoscyamine  Notes that her diet is limited to soft and pured foods because of her jackhammer esophagus She is consuming banana and peach smoothies Drinks 4 cans of Ensure a day Also consumes a form of organic shredded wheat that she softens in Ensure to make it easier to eat  She is currently maintaining a stable weight at 143 pounds  Bowel movements are fairly regular with the consumption of prunes If she does not eat prunes she may skip a day going to the bathroom  She expresses concern today regarding pain in the epigastrium When querying her about the pain it was difficult to discern if pain was different from that associated with her jackhammer esophagus No associated nausea or vomiting She is worried about this symptom and we discussed that we could perform an abdominal ultrasound CT imaging 02/2023 suggested  constipation which could also be contributing to some of her symptoms  Last colonoscopy:  Last endoscopy: 01/2020 -normal, empiric dilation to 18 mm, negative esophageal biopsies  Last Abd CT/CTE/MRE: CT angio 02/2023 -aortic atherosclerosis without aneurysm or dissection, small HH, stable dilation of CBD,  moderate amount of retained stool in the colon suggesting constipation, diverticulosis without diverticulitis  GI Review of Symptoms Significant for dysphagia and epigastric abdominal pain. Otherwise negative.  General Review of Systems  Review of systems is significant for the pertinent positives and negatives as listed per the HPI.  Full ROS is otherwise negative.  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   Vitamin D deficiency    2010     Past Surgical History   Past Surgical History:  Procedure Laterality Date   A&P, enterocele repair     s/p 2003   ABDOMINAL HYSTERECTOMY     BIOPSY  02/05/2020   Procedure: BIOPSY;  Surgeon: Tressia Danas, MD;  Location: [facility] ENDOSCOPY;  Service: Gastroenterology;;   BREAST REDUCTION SURGERY     s/p 1996      PATIENT DENIES BREAST REDUCTION    ESOPHAGEAL DILATION  02/05/2020   Procedure: ESOPHAGEAL DILATION;  Surgeon: Tressia Danas, MD;  Location: [facility] ENDOSCOPY;  Service: Gastroenterology;;   ESOPHAGEAL MANOMETRY N/A 05/07/2020   Procedure: ESOPHAGEAL MANOMETRY (EM);  Surgeon: Rachael Fee, MD;  Location: [facility] ENDOSCOPY;  Service: Endoscopy;  Laterality: N/A;   ESOPHAGOGASTRODUODENOSCOPY (EGD) WITH PROPOFOL N/A 02/05/2020   Procedure: ESOPHAGOGASTRODUODENOSCOPY (EGD) WITH PROPOFOL;  Surgeon: Tressia Danas, MD;  Location: [facility] ENDOSCOPY;  Service: Gastroenterology;  Laterality: N/A;   HEMORRHOID SURGERY     LEFT HEART CATH AND CORONARY ANGIOGRAPHY N/A 05/25/2017   Procedure: LEFT HEART CATH AND CORONARY ANGIOGRAPHY;  Surgeon: Corky Crafts, MD;  Location: [facility] INVASIVE CV LAB;  Service: Cardiovascular;  Laterality: N/A;     Allergies and Medications   Allergies  Allergen Reactions   Other Anaphylaxis    Reaction to GI Cocktail   Pantoprazole Other (See Comments)    Upset stomach and weight loss   Zyrtec [Cetirizine] Palpitations   Ciprofloxacin     Rash with burning all over body   Nifedipine     Caused low BP    Current Meds  Medication Sig   busPIRone (BUSPAR) 5 MG tablet TAKE 1 TABLET TWICE A DAY   flecainide (TAMBOCOR) 50 MG tablet TAKE 2 TABLETS EVERY MORNING AND 1  TABLET AT NIGHT   Replens Vaginal Moisturizer GEL Place 1 Application vaginally every morning.     Family History   Family History  Problem Relation Age of Onset   Stroke Father        @ 69   Hypertension Father    Hypertension Sister    Cancer Sister 18       brain   Breast cancer Other 72       breast   Breast cancer Daughter 35       spread to her brain   Cancer Other 5       colon/MELANOMA   Stroke Mother        medication induced   Kidney disease Brother    Cancer Brother    Social History   Social History   Tobacco Use   Smoking status: Former    Current packs/day: 0.00    Types: Cigarettes    Quit date: 05/24/1968    Years since quitting: 55.0   Smokeless tobacco: Never   Tobacco comments:    only smoked 7 years  Vaping Use   Vaping status: Never Used  Substance Use Topics   Alcohol use: No    Alcohol/week: 0.0 standard drinks of alcohol   Drug use: No    Quasha reports that she quit smoking about 55 years ago. Her smoking use included cigarettes. She has never used smokeless tobacco. She reports that she does not drink alcohol and does not use drugs.  Vital Signs and Physical Examination   Vitals:   06/09/23 1333  BP: 130/70  Pulse: 98  Height: 5\' 4"  (1.626 m)  Weight: 143 lb (64.9 kg)  BMI (Calculated): 24.53    General: Elderly, well nourished, no acute distress Head: Normocephalic and atraumatic Eyes: Sclerae anicteric, EOMI Ears: Normal auditory acuity Mouth: No deformities or lesions noted Lungs: Clear throughout to auscultation Heart: Regular rate and rhythm; Abdomen: Soft, non tender and non distended. No masses, hepatosplenomegaly or hernias noted. Normal Bowel sounds Rectal: Musculoskeletal: Symmetrical with no gross deformities  Pulses:  Normal pulses noted Extremities: No edema or deformities noted Neurological: Alert oriented x 4, grossly nonfocal Psychological:  Alert and cooperative. Normal mood and affect   Review of Data  The following data was reviewed at the time of this encounter:  Laboratory Studies      Latest Ref Rng & Units 04/09/2023    4:38 AM 03/05/2023   12:32 AM 02/18/2023    4:40 AM  CBC  WBC 4.0 - 10.5 K/uL 4.0  3.8  4.1   Hemoglobin 12.0 - 15.0 g/dL 96.0  45.4  09.8   Hematocrit 36.0 - 46.0 % 40.2  38.9  38.0   Platelets 150 - 400 K/uL 215  221  230     Lab Results  Component Value Date   LIPASE 25 03/05/2023      Latest Ref Rng & Units 04/09/2023    4:38 AM 03/05/2023   12:32 AM 02/18/2023    4:40 AM  CMP  Glucose 70 - 99 mg/dL 92  119  99   BUN 8 - 23 mg/dL 11  12  19    Creatinine 0.44 - 1.00 mg/dL 1.47  8.29  5.62   Sodium 135 - 145 mmol/L 138  140  140   Potassium 3.5 - 5.1 mmol/L 3.8  3.8  3.7   Chloride 98 - 111 mmol/L 106  100  102   CO2 22 - 32 mmol/L 21  26  26   Calcium 8.9 - 10.3 mg/dL 9.2  9.7  9.0   Total Protein 6.5 - 8.1 g/dL  7.0    Total Bilirubin 0.3 -  1.2 mg/dL  0.5    Alkaline Phos 38 - 126 U/L  70    AST 15 - 41 U/L  24    ALT 0 - 44 U/L  19       Imaging Studies  CT angio 03/05/2023 1. Aortic atherosclerosis without any evidence aneurysm or dissection. 2. Small hiatal hernia. 3. Stable dilatation of the common bile duct, unchanged from 2022. 4. Moderate amount of retained stool in the colon suggesting constipation. 5. Diverticulosis without diverticulitis.  CTAP 07/11/2022 CT of the chest: No acute abnormality is noted in the chest.   CT of the abdomen and pelvis: Distended gallbladder without evidence of cholelithiasis. Stable dilatation of the common bile duct is noted unchanged from 2022. This likely represents the patient's steady state.   Diverticulosis without diverticulitis. No other focal abnormality is noted.     GI Procedures and Studies  Dysphagia, weight loss:  - 12/2019: UGI series: Moderate presbyesophagus.  Small prox duodenal tic.  No HH or GERD.    - 12/2019 MBSS: limited due to pt reticence to participate.   "functional oropharyngeal swallow ability with no aspiration or deep laryngeal penetration of any consistency tested. Trace laryngeal penetration on swallow of thin via straw is [facility] for age. In addition, no pharyngeal residuals post-swallow.  Minimal difficulty orally transiting pudding bolus with appearance of near gagging but with encouragement pt able to propel posterior and swallow.  No oral issues with cracker, nectar or thin. SLP oral holding/near gagging with puree was anxiety driven."  - EGD September 2021 Dr. Orvan Falconer while in hospital showed a normal esophagus that was dilated and then biopsied, also showed mild gastropathy.  The biopsies from these showed no clear sign of etiology.   - High-res esophageal manometry December 2021, normal GE junction, + evidence of nutcracker esophagus.  Trial of nifedipine 10 mg capsules 1 cap 3 times daily.  This caused some lightheadedness, low blood pressure and  so she has been relying on hyoscyamine, blender food.   [facility] Second Opinion 2023 w/ Dr. Ruthell Rummage -  GI manometry reviewed and concurred with diagnosis of jackhammer esophagus - Agreed with ongoing use of hyoscyamine; risks outweigh benefits of POEM at her age   Clinical Impression  It is my clinical impression that Ms. Cona is a 89 y.o. female with;  Jackhammer/nutcracker esophagus diagnosed 2021 Epigastric abdominal pain  Ms. Whidbee presents to the office today for follow-up of jackhammer esophagus and epigastric abdominal pain.  She has a history of dysphagia dating back to 2021 demonstrating normal upper GI series and EGD.  High-resolution esophageal manometry in 2021 disclosed evidence of jackhammer/nutcracker esophagus.  She was given a trial of nifedipine but this resulted in lightheadedness and low blood pressure salting and cessation of the medication.  Since that time she has been treated with high-dose hyoscyamine 0.375 mg on an as needed basis as well as a soft/pured diet.  She participated in a second opinion at [facility] gastroenterology in 2023 with Dr. Ruthell Rummage who concurred with the manometry findings and ongoing management with hyoscyamine.  Her son had inquired about the possibility of POEM, however, Dr. Sheppard Penton felt that the risks would outweigh the benefits at Ms. Napoli's age.  At today's visit she reports intermittent symptoms of dysphagia and pain. In  discussing her epigastric abdominal pain it was difficult to discern if this is new or different from the previous pain she has been evaluated for.  CT scans of the chest and abdomen in 2024 have generally been reassuring.  She expressed concern regarding the symptoms and I offered to further explore this with an abdominal ultrasound.  At this juncture, I feel that continuing with high-dose hyoscyamine is likely in her best interest.  If the medication stops working or symptoms progress can consider further evaluation or  revisiting nitrates or calcium channel blockers which she had difficulty tolerating in the past.  I am of the same opinion as Dr. Sheppard Penton that POEM has risks at her age I would not advise pursuing that as a modality.  Plan  Continue hyoscyamine 0.375 mg sublingual twice daily as needed for management of jackhammer esophagus; reserve nitrates or CCB's for refractory symptoms Continue soft/pured diet conjunction with Ensure daily Abdominal ultrasound ordered to evaluate epigastric abdominal pain Monitor weight and anthropometrics  Planned Follow Up 12 months  The patient or caregiver verbalized understanding of the material covered, with no barriers to understanding. All questions were answered. Patient or caregiver is agreeable with the plan outlined above.    It was a pleasure to see [place].  If you have any questions or concerns regarding this evaluation, do not hesitate to contact me.  Maren Beach, MD [facility]   I spent total of  30 minutes in both face-to-face and non-face-to-face activities, excluding procedures performed, for the visit on the date of this encounter.

## 2023-06-09 ENCOUNTER — Ambulatory Visit (INDEPENDENT_AMBULATORY_CARE_PROVIDER_SITE_OTHER): Payer: Medicare Other | Admitting: Pediatrics

## 2023-06-09 ENCOUNTER — Encounter: Payer: Self-pay | Admitting: Pediatrics

## 2023-06-09 VITALS — BP 130/70 | HR 98 | Ht 64.0 in | Wt 143.0 lb

## 2023-06-09 DIAGNOSIS — R1013 Epigastric pain: Secondary | ICD-10-CM | POA: Diagnosis not present

## 2023-06-09 DIAGNOSIS — K224 Dyskinesia of esophagus: Secondary | ICD-10-CM | POA: Diagnosis not present

## 2023-06-09 MED ORDER — HYOSCYAMINE SULFATE ER 0.375 MG PO TBCR: 0.3750 mg | EXTENDED_RELEASE_TABLET | Freq: Two times a day (BID) | ORAL | 3 refills | Status: DC

## 2023-06-09 NOTE — Patient Instructions (Signed)
_______________________________________________________  If your blood pressure at your visit was 140/90 or greater, please contact your primary care physician to follow up on this. _______________________________________________________  If you are age 88 or older, your body mass index should be between 23-30. Your Body mass index is 24.55 kg/m. If this is out of the aforementioned range listed, please consider follow up with your Primary Care Provider.  If you are age 39 or younger, your body mass index should be between 19-25. Your Body mass index is 24.55 kg/m. If this is out of the aformentioned range listed, please consider follow up with your Primary Care Provider.  _______________________________________________________  The [facility] providers would like to encourage you to use [facility] to communicate with providers for non-urgent requests or questions.  Due to long hold times on the telephone, sending your provider a message by [facility] may be a faster and more efficient way to get a response.  Please allow 48 business hours for a response.  Please remember that this is for non-urgent requests.  _______________________________________________________  We have sent the following medications to your pharmacy for you to pick up at your convenience:  CONTINUE: Hyoscyamine 0.375 mg one tablet twice daily  You have been scheduled for an abdominal ultrasound at [facility] Radiology (1st floor of hospital) on 06-20-23 at 10:30 am. Please arrive 15 minutes prior to your appointment for registration. Make certain not to have anything to eat or drink after midnight the night prior to your appointment. Should you need to reschedule your appointment, please contact radiology at [phone]. This test typically takes about 30 minutes to perform.  You will need a follow up appointment in 1 year.  We will contact you to schedule this appointment.  Due to recent changes in healthcare laws, you may  see the results of your imaging and laboratory studies on MyChart before your provider has had a chance to review them.  We understand that in some cases there may be results that are confusing or concerning to you. Not all laboratory results come back in the same time frame and the provider may be waiting for multiple results in order to interpret others.  Please give [place] 48 hours in order for your provider to thoroughly review all the results before contacting the office for clarification of your results.   Thank you for entrusting me with your care and choosing [facility].  Dr Doy Hutching

## 2023-06-10 ENCOUNTER — Telehealth: Payer: Self-pay | Admitting: Pediatrics

## 2023-06-10 NOTE — Telephone Encounter (Signed)
Patient pharmacy rep called and stated that the medication prescribed to her Hyoscyamine 0.375 mg one tablet twice daily was no long available and the are requesting a alternative. Pharmacy rep provide a call back number [phone] and the reference number is also [id number].Please advise.

## 2023-06-13 MED ORDER — HYOSCYAMINE SULFATE 0.125 MG SL SUBL: 0.1250 mg | SUBLINGUAL_TABLET | Freq: Two times a day (BID) | SUBLINGUAL | 0 refills | Status: DC | PRN

## 2023-06-13 NOTE — Telephone Encounter (Signed)
Phone call returned to [facility] regarding hyoscyamine. Representative stated that patient canceled the Rx for medication.  Phone call to patient to ask for the reason she canceled the Rx and she stated that she has been taking hyoscyamine 0.125mg  instead of 0.375mg .  She prefers to continue taking hyoscyamine 0.125mg  BID until after she has abdominal ultrasound on 06-20-23.  If medication needs to be increased at that time, she will take the increased dose.  Is it OK to send in hyoscyamine 0.125mg  BID as needed?

## 2023-06-13 NOTE — Telephone Encounter (Signed)
Rx for hyoscyamine 0.125mg  sent to [facility] as requested.

## 2023-06-20 ENCOUNTER — Ambulatory Visit (HOSPITAL_COMMUNITY)
Admission: RE | Admit: 2023-06-20 | Discharge: 2023-06-20 | Disposition: A | Discharge status: Home / Self Care | Payer: Medicare Other | Source: Ambulatory Visit | Attending: Pediatrics | Admitting: Pediatrics

## 2023-06-20 DIAGNOSIS — R1013 Epigastric pain: Secondary | ICD-10-CM | POA: Insufficient documentation

## 2023-06-20 DIAGNOSIS — K224 Dyskinesia of esophagus: Secondary | ICD-10-CM | POA: Diagnosis not present

## 2023-06-20 DIAGNOSIS — K838 Other specified diseases of biliary tract: Secondary | ICD-10-CM | POA: Diagnosis not present

## 2023-06-20 DIAGNOSIS — K8020 Calculus of gallbladder without cholecystitis without obstruction: Secondary | ICD-10-CM | POA: Diagnosis not present

## 2023-07-05 ENCOUNTER — Encounter: Payer: Self-pay | Admitting: Pediatrics

## 2023-07-10 ENCOUNTER — Emergency Department (HOSPITAL_COMMUNITY)
Admission: EM | Admit: 2023-07-10 | Discharge: 2023-07-10 | Disposition: A | Discharge status: Home / Self Care | Payer: Medicare Other | Attending: Emergency Medicine | Admitting: Emergency Medicine

## 2023-07-10 ENCOUNTER — Emergency Department (HOSPITAL_COMMUNITY): Payer: Medicare Other

## 2023-07-10 ENCOUNTER — Other Ambulatory Visit: Payer: Self-pay

## 2023-07-10 ENCOUNTER — Encounter (HOSPITAL_COMMUNITY): Payer: Self-pay | Admitting: Emergency Medicine

## 2023-07-10 DIAGNOSIS — K8020 Calculus of gallbladder without cholecystitis without obstruction: Secondary | ICD-10-CM | POA: Diagnosis not present

## 2023-07-10 DIAGNOSIS — R109 Unspecified abdominal pain: Secondary | ICD-10-CM | POA: Diagnosis present

## 2023-07-10 DIAGNOSIS — R1010 Upper abdominal pain, unspecified: Secondary | ICD-10-CM | POA: Diagnosis not present

## 2023-07-10 DIAGNOSIS — R140 Abdominal distension (gaseous): Secondary | ICD-10-CM | POA: Diagnosis not present

## 2023-07-10 DIAGNOSIS — R1011 Right upper quadrant pain: Secondary | ICD-10-CM | POA: Diagnosis not present

## 2023-07-10 DIAGNOSIS — R1084 Generalized abdominal pain: Secondary | ICD-10-CM | POA: Diagnosis not present

## 2023-07-10 DIAGNOSIS — R110 Nausea: Secondary | ICD-10-CM | POA: Diagnosis not present

## 2023-07-10 DIAGNOSIS — R9431 Abnormal electrocardiogram [ECG] [EKG]: Secondary | ICD-10-CM | POA: Diagnosis not present

## 2023-07-10 LAB — CBC
HCT: 39 % (ref 36.0–46.0)
Hemoglobin: 13.1 g/dL (ref 12.0–15.0)
MCH: 32 pg (ref 26.0–34.0)
MCHC: 33.6 g/dL (ref 30.0–36.0)
MCV: 95.1 fL (ref 80.0–100.0)
Platelets: 223 K/uL (ref 150–400)
RBC: 4.1 MIL/uL (ref 3.87–5.11)
RDW: 13.5 % (ref 11.5–15.5)
WBC: 6.7 K/uL (ref 4.0–10.5)
nRBC: 0 % (ref 0.0–0.2)

## 2023-07-10 LAB — COMPREHENSIVE METABOLIC PANEL WITH GFR
ALT: 128 U/L — ABNORMAL HIGH (ref 0–44)
AST: 171 U/L — ABNORMAL HIGH (ref 15–41)
Albumin: 4 g/dL (ref 3.5–5.0)
Alkaline Phosphatase: 102 U/L (ref 38–126)
Anion gap: 12 (ref 5–15)
BUN: 17 mg/dL (ref 8–23)
CO2: 24 mmol/L (ref 22–32)
Calcium: 9.4 mg/dL (ref 8.9–10.3)
Chloride: 100 mmol/L (ref 98–111)
Creatinine, Ser: 0.89 mg/dL (ref 0.44–1.00)
GFR, Estimated: >60 mL/min (ref >60)
Glucose, Bld: 131 mg/dL — ABNORMAL HIGH (ref 70–99)
Potassium: 3.8 mmol/L (ref 3.5–5.1)
Sodium: 136 mmol/L (ref 135–145)
Total Bilirubin: 1.1 mg/dL (ref 0.0–1.2)
Total Protein: 6.9 g/dL (ref 6.5–8.1)

## 2023-07-10 LAB — URINALYSIS, ROUTINE W REFLEX MICROSCOPIC
Bacteria, UA: NONE SEEN
Bilirubin Urine: NEGATIVE
Glucose, UA: NEGATIVE mg/dL
Hgb urine dipstick: NEGATIVE
Ketones, ur: NEGATIVE mg/dL
Nitrite: NEGATIVE
Protein, ur: NEGATIVE mg/dL
Specific Gravity, Urine: 1.01 (ref 1.005–1.030)
pH: 8 (ref 5.0–8.0)

## 2023-07-10 LAB — LIPASE, BLOOD: Lipase: 22 U/L (ref 11–51)

## 2023-07-10 NOTE — Discharge Instructions (Addendum)
Your pain today is likely related to your esophagus problems.  You need to follow-up with your gastroenterologist to have blood work rechecked in the next couple of weeks.  If you develop fever or worsening pain, please come back to the emergency department.

## 2023-07-10 NOTE — ED Triage Notes (Signed)
  Patient BIB EMS for abdominal pain and nausea that started yesterday morning.  Pain is in RUQ and R flank.  States pain has resolved now but area "feels dead".  Endorses nausea with no emesis.  Hx of esophageal issues and had RUQ  [place] on 1/27 for similar symptoms.

## 2023-07-10 NOTE — ED Provider Notes (Signed)
[name] [name] EMERGENCY DEPARTMENT AT [facility] Provider Note   CSN: [id number] Arrival date & time: 07/10/23  0049     History  Chief Complaint  Patient presents with   Abdominal Pain   Nausea    [name] [name] is a 89 y.o. female.  Patient presents to the emergency department for evaluation of abdominal discomfort.  Patient reports that the pain was in the upper central and right side of her abdomen.  She did take her medications given to her by gastroenterology and the pain has improved.       Home Medications Prior to Admission medications   Medication Sig Start Date End Date Taking? Authorizing Provider  busPIRone (BUSPAR) 5 MG tablet TAKE 1 TABLET TWICE A DAY 02/09/23   Kathleen Lime, MD  flecainide (TAMBOCOR) 50 MG tablet TAKE 2 TABLETS EVERY MORNING AND 1 TABLET AT NIGHT 01/19/23   Marinus Maw, MD  hyoscyamine (LEVSIN SL) 0.125 MG SL tablet Place 1 tablet (0.125 mg total) under the tongue 2 (two) times daily as needed. 06/13/23   Ottie Glazier, MD  Replens Vaginal Moisturizer GEL Place 1 Application vaginally every morning. 08/13/22   Belva Agee, MD      Allergies    Other, Pantoprazole, Zyrtec [cetirizine], Ciprofloxacin, and Nifedipine    Review of Systems   Review of Systems  Physical Exam Updated Vital Signs BP (!) 125/50   Pulse 80   Temp 98.7 F (37.1 C)   Resp 17   Ht 5\' 4"  (1.626 m)   Wt 64.9 kg   LMP 05/24/1970   SpO2 98%   BMI 24.55 kg/m  Physical Exam Vitals and nursing note reviewed.  Constitutional:      General: She is not in acute distress.    Appearance: She is well-developed.  HENT:     Head: Normocephalic and atraumatic.     Mouth/Throat:     Mouth: Mucous membranes are moist.  Eyes:     General: Vision grossly intact. Gaze aligned appropriately.     Extraocular Movements: Extraocular movements intact.     Conjunctiva/sclera: Conjunctivae normal.  Cardiovascular:     Rate and Rhythm: Normal rate and  regular rhythm.     Pulses: Normal pulses.     Heart sounds: Normal heart sounds, S1 normal and S2 normal. No murmur heard.    No friction rub. No gallop.  Pulmonary:     Effort: Pulmonary effort is normal. No respiratory distress.     Breath sounds: Normal breath sounds.  Abdominal:     General: Bowel sounds are normal.     Palpations: Abdomen is soft.     Tenderness: There is no abdominal tenderness. There is no guarding or rebound.     Hernia: No hernia is present.  Musculoskeletal:        General: No swelling.     Cervical back: Full passive range of motion without pain, normal range of motion and neck supple. No spinous process tenderness or muscular tenderness. Normal range of motion.     Right lower leg: No edema.     Left lower leg: No edema.  Skin:    General: Skin is warm and dry.     Capillary Refill: Capillary refill takes less than 2 seconds.     Findings: No ecchymosis, erythema, rash or wound.  Neurological:     General: No focal deficit present.     Mental Status: She is alert and oriented to person, place,  and time.     GCS: GCS eye subscore is 4. GCS verbal subscore is 5. GCS motor subscore is 6.     Cranial Nerves: Cranial nerves 2-12 are intact.     Sensory: Sensation is intact.     Motor: Motor function is intact.     Coordination: Coordination is intact.  Psychiatric:        Attention and Perception: Attention normal.        Mood and Affect: Mood normal.        Speech: Speech normal.        Behavior: Behavior normal.     ED Results / Procedures / Treatments   Labs (all labs ordered are listed, but only abnormal results are displayed) Labs Reviewed  COMPREHENSIVE METABOLIC PANEL - Abnormal; Notable for the following components:      Result Value   Glucose, Bld 131 (*)    AST 171 (*)    ALT 128 (*)    All other components within normal limits  URINALYSIS, ROUTINE W REFLEX MICROSCOPIC - Abnormal; Notable for the following components:   APPearance  HAZY (*)    Leukocytes,Ua TRACE (*)    All other components within normal limits  LIPASE, BLOOD  CBC    EKG EKG Interpretation Date/Time:  Sunday July 10 2023 00:56:15 EST Ventricular Rate:  92 PR Interval:  233 QRS Duration:  99 QT Interval:  366 QTC Calculation: 453 R Axis:   68  Text Interpretation: Sinus rhythm Prolonged PR interval Low voltage, precordial leads Confirmed by Gilda Crease [phone]) on 07/10/2023 1:08:25 AM  Radiology US ABDOMEN LIMITED RUQ (LIVER/GB) Result Date: 07/10/2023 CLINICAL DATA:  Upper abdominal pain with known gallstones. EXAM: ULTRASOUND ABDOMEN LIMITED RIGHT UPPER QUADRANT COMPARISON:  06/20/2023 FINDINGS: Gallbladder: Cholelithiasis with multiple echogenic and shadowing calculi. No wall thickening or focal tenderness. Common bile duct: Diameter: 10 mm-stable from at least 2022. Where visualized, no filling defect. Liver: No focal lesion identified. Within normal limits in parenchymal echogenicity. Portal vein is patent on color Doppler imaging with normal direction of blood flow towards the liver. IMPRESSION: 1. Cholelithiasis without indication of acute cholecystitis. 2. Chronic distension of the CBD without visible stone. Electronically Signed   By: Tiburcio Pea M.D.   On: 07/10/2023 04:29    Procedures Procedures    Medications Ordered in ED Medications - No data to display  ED Course/ Medical Decision Making/ A&P                                 Medical Decision Making Amount and/or Complexity of Data Reviewed Labs: ordered. Radiology: ordered.   Patient is anxious at arrival.  She is concerned because she was called and told that her ultrasound had some abnormalities.  This was just performed as an outpatient by gastroenterology.  Ultrasound was reviewed and there were no abnormalities noted -has a gallstone without signs of cholecystitis, no change in, bile duct diameter.  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  She is prescribed hyoscyamine to be used for pain secondary to hyperactive esophagus.  She did use it tonight with some improvement.  Patient noted to have slightly elevated transaminases today.  Reviewing her records reveals presentation 1 year ago where she had elevated LFTs and was admitted.  She had extensive workup at that time and no cause  was found, LFTs corrected without intervention.  Because of the LFTs, repeat ultrasound was performed to make sure she does not have signs of acute cholecystitis tonight.  There is no change seen today.  Patient will be appropriate for discharge and follow-up with gastroenterology in the office.        Final Clinical Impression(s) / ED Diagnoses Final diagnoses:  Right upper quadrant abdominal pain    Rx / DC Orders ED Discharge Orders     None         Gilda Crease, MD 07/10/23 [phone]

## 2023-07-15 ENCOUNTER — Telehealth: Payer: Self-pay | Admitting: Pediatrics

## 2023-07-15 DIAGNOSIS — R7401 Elevation of levels of liver transaminase levels: Secondary | ICD-10-CM

## 2023-07-15 NOTE — Telephone Encounter (Signed)
Patient called and stated that she was told by her son that she needing to call us regarding her Ultrasound results. Patient is requesting a call back. Please advise.

## 2023-07-15 NOTE — Telephone Encounter (Signed)
 Left message for patient to call back

## 2023-07-15 NOTE — Telephone Encounter (Signed)
Patient states that she is concerned because she was told that the ultrasound she had done was abnormal and wanted to discuss. I advised that ultrasound completed 05/2023 as ordered by Dr Doy Hutching did not reveal any acute abnormalities. She did have a gallstone, but no inflammation to suggest infection or need for intervention. Her CBD was dilated but this was a stable finding. Patient states that she did go to the hospital not long ago and they repeated u/s because she had elevated LFT's. Again, no acute changes noted. ED report notates that although LFT's are elevated, she had this also occur during hospitalization 1 year ago with negative workup and normalization over time without intervention.  Patient days she is "feeling pretty good today" but goes on to question results of urinalysis, CBC, BMP etc and discusses UTI symptoms, hematuria etc. I have recommended that she contact her PCP about any concerns related to this. She verbalizes understanding.  Dr Doy Hutching, just FYI since LFT's did elevate at hospital visit 07/10/23.

## 2023-07-18 NOTE — Telephone Encounter (Signed)
 Left message for patient to call back

## 2023-07-19 NOTE — Addendum Note (Signed)
 Addended by: Richardson Chiquito on: 07/19/2023 09:20 AM   Modules accepted: Orders

## 2023-07-19 NOTE — Telephone Encounter (Signed)
 I have spoken to patient to advise of Dr Osie Bond' recommendations. She verbalizes understanding and is in agreement with this plan. Lab orders have been placed in EPIC.  MRCP has been scheduled at [facility] Radiology on Tuesday 08/02/23 at 9 am, 830 am arrival, NPO 4 hours prior. Patient has been made aware of this appointment as well and verbalizes understanding of this.

## 2023-08-02 ENCOUNTER — Other Ambulatory Visit: Payer: Self-pay | Admitting: Pediatrics

## 2023-08-02 ENCOUNTER — Ambulatory Visit (HOSPITAL_COMMUNITY)
Admission: RE | Admit: 2023-08-02 | Discharge: 2023-08-02 | Disposition: A | Discharge status: Home / Self Care | Payer: Medicare Other | Source: Ambulatory Visit | Attending: Pediatrics | Admitting: Pediatrics

## 2023-08-02 DIAGNOSIS — K8070 Calculus of gallbladder and bile duct without cholecystitis without obstruction: Secondary | ICD-10-CM | POA: Diagnosis not present

## 2023-08-02 DIAGNOSIS — R7401 Elevation of levels of liver transaminase levels: Secondary | ICD-10-CM | POA: Insufficient documentation

## 2023-08-02 DIAGNOSIS — N281 Cyst of kidney, acquired: Secondary | ICD-10-CM | POA: Diagnosis not present

## 2023-08-02 DIAGNOSIS — K838 Other specified diseases of biliary tract: Secondary | ICD-10-CM | POA: Diagnosis not present

## 2023-08-02 DIAGNOSIS — R7989 Other specified abnormal findings of blood chemistry: Secondary | ICD-10-CM | POA: Diagnosis not present

## 2023-08-02 MED ORDER — GADOBUTROL 1 MMOL/ML IV SOLN
6.0000 mL | Freq: Once | INTRAVENOUS | Status: AC | PRN
  Administered 2023-08-02: 6 mL via INTRAVENOUS

## 2023-08-23 ENCOUNTER — Encounter: Payer: Self-pay | Admitting: Pediatrics

## 2023-08-23 ENCOUNTER — Other Ambulatory Visit (INDEPENDENT_AMBULATORY_CARE_PROVIDER_SITE_OTHER)

## 2023-08-23 ENCOUNTER — Telehealth: Payer: Self-pay | Admitting: Pediatrics

## 2023-08-23 DIAGNOSIS — K8060 Calculus of gallbladder and bile duct with cholecystitis, unspecified, without obstruction: Secondary | ICD-10-CM

## 2023-08-23 DIAGNOSIS — R7401 Elevation of levels of liver transaminase levels: Secondary | ICD-10-CM | POA: Diagnosis not present

## 2023-08-23 LAB — CBC WITH DIFFERENTIAL/PLATELET
Basophils Absolute: 0 10*3/uL (ref 0.0–0.1)
Basophils Relative: 0.7 % (ref 0.0–3.0)
Eosinophils Absolute: 0.1 10*3/uL (ref 0.0–0.7)
Eosinophils Relative: 1.3 % (ref 0.0–5.0)
HCT: 39.1 % (ref 36.0–46.0)
Hemoglobin: 13 g/dL (ref 12.0–15.0)
Lymphocytes Relative: 26.9 % (ref 12.0–46.0)
Lymphs Abs: 1.5 10*3/uL (ref 0.7–4.0)
MCHC: 33.3 g/dL (ref 30.0–36.0)
MCV: 97.3 fl (ref 78.0–100.0)
Monocytes Absolute: 0.6 10*3/uL (ref 0.1–1.0)
Monocytes Relative: 10.5 % (ref 3.0–12.0)
Neutro Abs: 3.3 10*3/uL (ref 1.4–7.7)
Neutrophils Relative %: 60.6 % (ref 43.0–77.0)
Platelets: 230 10*3/uL (ref 150.0–400.0)
RBC: 4.02 Mil/uL (ref 3.87–5.11)
RDW: 13.4 % (ref 11.5–15.5)
WBC: 5.4 10*3/uL (ref 4.0–10.5)

## 2023-08-23 LAB — HEPATIC FUNCTION PANEL
ALT: 24 U/L (ref 0–35)
AST: 23 U/L (ref 0–37)
Albumin: 4.4 g/dL (ref 3.5–5.2)
Alkaline Phosphatase: 91 U/L (ref 39–117)
Bilirubin, Direct: 0.1 mg/dL (ref 0.0–0.3)
Total Bilirubin: 0.3 mg/dL (ref 0.2–1.2)
Total Protein: 7.5 g/dL (ref 6.0–8.3)

## 2023-08-23 LAB — GAMMA GT: GGT: 52 U/L — ABNORMAL HIGH (ref 7–51)

## 2023-08-23 NOTE — Telephone Encounter (Signed)
 MRI report available in Epic, thanks.

## 2023-08-23 NOTE — Telephone Encounter (Signed)
 Called and spoke with patient regarding MRI results. Patient states that she is not having pain at this time. Patient will stop by on Thursday for labs. Patient is aware of lab hours and location.

## 2023-08-23 NOTE — Telephone Encounter (Signed)
 [name] Glazier, MD to Me     08/23/23 12:10 PM I reviewed the report which shows suspicion for choledocholithiasis at the time study was performed on March 11.  Please call [name] [name] to find out if she is still having ongoing symptoms currently.  If she is still having abdominal pain I would recommend she go to the emergency department as she may need to be admitted for consideration of ERCP to alleviate stone obstruction.  If her symptoms have resolved I would at least like her to have labs done with a CBC and hepatic function panel.  Thanks,  Harriett Sine

## 2023-08-23 NOTE — Addendum Note (Signed)
 Addended by: Marisa Sprinkles on: 08/23/2023 01:24 PM   Modules accepted: Orders

## 2023-08-23 NOTE — Telephone Encounter (Signed)
 Radiology is calling with a report. They wanted to make sure that we received the details on the MRI. Confirmed results.

## 2023-09-08 ENCOUNTER — Ambulatory Visit (INDEPENDENT_AMBULATORY_CARE_PROVIDER_SITE_OTHER): Admitting: Student

## 2023-09-08 VITALS — BP 141/75 | HR 79 | Temp 97.7°F | Ht 64.0 in | Wt 147.0 lb

## 2023-09-08 DIAGNOSIS — F419 Anxiety disorder, unspecified: Secondary | ICD-10-CM | POA: Diagnosis not present

## 2023-09-08 DIAGNOSIS — K8050 Calculus of bile duct without cholangitis or cholecystitis without obstruction: Secondary | ICD-10-CM | POA: Diagnosis not present

## 2023-09-08 DIAGNOSIS — Z8673 Personal history of transient ischemic attack (TIA), and cerebral infarction without residual deficits: Secondary | ICD-10-CM

## 2023-09-08 MED ORDER — HYDROCORTISONE 1 % EX CREA: TOPICAL_CREAM | CUTANEOUS | 1 refills | Status: AC

## 2023-09-08 NOTE — Patient Instructions (Addendum)
 Thank you, Ms.Isidor Marek Fojtik for allowing us  to provide your care today. Today we discussed your concerns regarding your liver, parasite infection, and labs.   I have ordered the following labs for you:  Lab Orders  No laboratory test(s) ordered today     Tests ordered today:  None  Referrals ordered today:   Referral Orders  No referral(s) requested today     I have ordered the following medication/changed the following medications:   Stop the following medications: There are no discontinued medications.   Start the following medications: No orders of the defined types were placed in this encounter.    Follow up: as needed   Remember:   Results of your MRI March 2025:  Choledocholithiasis (stones in the common bile duct), with two suspected distal CBD stones measuring up to 12 mm. Consider ERCP, as clinically warranted.   If you have pain in the right side that happens more frequently, please consider calling Dr. Yvone Herd (your gastroenterologist) again as you may be having issues with your gallbladder that she needs to be aware of.   If you have any right sided sharp pain that is constant and worsens with eating, develop a fever, notice skin changes such as a yellowing of your skin and eyes, or you experience vomiting/can't keep drink or food down, please go to urgent care or your nearest emergency room as you may be having a medical emergency.   Your recent labs did not show concern for an infection, such as a parasite infection. No labs needed today.   Someone will call you to go over the health concerns that may be giving you some anxiety. If you need additional support please call our clinic to let us  know.   Your hydrocortisone cream will be sent to your preferred pharmacy.   Should you have any questions or concerns please call the internal medicine clinic at [phone].     Demyan Fugate Arellano Zameza, MD PGY-1 Internal Medicine Teaching Progam [facility]  Internal Medicine Center

## 2023-09-08 NOTE — Progress Notes (Signed)
 Established Patient Office Visit  Subjective   Patient ID: [name] [name], female    DOB: Oct 01, 1929  Age: 89 y.o. MRN: [id number]  Chief Complaint  Patient presents with   Follow-up    Patient is a 89 y.o. with a past medical history stated below who presents today for concerns regarding recent liver imaging, rash/parasitic infection, and recent headache. Please see problem based assessment and plan for additional details.    Current medications:  - Buspirone  BID  - Flecainide  - Replens - Aspirin   - Hydrocortisone  cream, provided refill today, patient uses for hemorrhoids as needed *Not taking hyoscyamine    Past Medical History:  Diagnosis Date   Anemia    iron def   Anxiety    Arrhythmia    paroxysmal SVT and symtomatic PAC's   ATRIAL PREMATURE BEATS 03/20/2009       Carolynne Citron, MD, Ardella Konig, Evelene Hint      Cerumen impaction 08/27/2021   Common bile duct dilation 04/02/2021   Diverticulosis    colon   Dysuria 03/07/2019   Elevated troponin    Gastritis and gastroduodenitis    GERD 04/02/2008   Qualifier: Diagnosis of  By: Autry Legions MD, Alveda Aures    History of colonic polyps    cecal   Hypertension    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  Gastrointestinal:  Negative for abdominal pain, diarrhea, nausea and vomiting.  Genitourinary:  Negative for dysuria.  Skin:        Skin lesions on sun exposed areas  Neurological:  Negative for dizziness, speech change and focal weakness.       Recent headache     Objective:     BP (!) 141/75 (BP  Location: Left Arm, Patient Position: Sitting, Cuff Size: Normal)   Pulse 79   Temp 97.7 F (36.5 C) (Oral)   Ht 5\' 4"  (1.626 m)   Wt 147 lb (66.7 kg)   LMP 05/24/1970   SpO2 99%   BMI 25.23 kg/m  BP Readings from Last 3 Encounters:  09/08/23 (!) 141/75  07/10/23 122/62  06/09/23 130/70   Wt Readings from Last 3 Encounters:  09/08/23 147 lb (66.7 kg)  07/10/23 143 lb (64.9 kg)  06/09/23 143 lb (64.9 kg)      Physical Exam HENT:     Head: Normocephalic and atraumatic.  Cardiovascular:     Rate and Rhythm: Normal rate.  Pulmonary:     Effort: Pulmonary effort is normal.     Breath sounds: Normal breath sounds.  Abdominal:     General: Bowel sounds are normal. There is no distension.     Palpations: Abdomen is soft. There is no mass.     Tenderness: There is abdominal tenderness. There is no guarding.     Comments: Negative murphy's sign, no splenomegaly, epigastric tenderness on deep palpation  Skin:    General: Skin is warm and dry.     Comments: Scattered rough scaly papules/areas on sun exposed areas such as the neck, forearms, and  dorsal surfaces of the hands  Neurological:     Mental Status: She is alert.    No results found for any visits on 09/08/23.  Last CBC Lab Results  Component Value Date   WBC 5.4 08/23/2023   HGB 13.0 08/23/2023   HCT 39.1 08/23/2023   MCV 97.3 08/23/2023   MCH 32.0 07/10/2023   RDW 13.4 08/23/2023   PLT 230.0 08/23/2023   The ASCVD Risk score (Arnett DK, et al., 2019) failed to calculate for the following reasons:   The 2019 ASCVD risk score is only valid for ages 17 to 15    Assessment & Plan:   Problem List Items Addressed This Visit     Anxiety - Primary   Patient endorsed some anxiety regarding worry she is infected with parasites. When asked why she feels that way she explained she read 'parasite' on some paperwork she received at a previous visit. Chart review indicates it may have been on the differential for a  vaginal infection. Reviewed recent CBC with diff which showed no concern for present infection, discussed eosinophils WNL. Patient still seemed anxious and asked if spots on her skin were related to parasites. On physical exam, these spots appeared to be consistent with actinic keratoses. Explained that these were due to sun damage, recommended further evaluation by dermatologist. Patient seemed to be more reassured following this explanation.   Patient dose endorse taking buspirone  5 mg BID and says it's helpful for her anxiety. Due to anxiety revolving around other health concerns, discussed resources like talk therapy/behavioral counseling. Patient seemed excited about the idea of getting to talk to someone about her concerns.   Plan - Placed AMB [facility]BC[facility] Care Management referral today - Continue buspirone  5 mg BID      Relevant Orders   AMB Referral [facility]BCI Care Management   History of TIA (transient ischemic attack) (Chronic)   Patient shared she experienced a sharp headache that lasted about 20 minutes a few days ago. Patient denied any associated symptoms such as vision changes, slurred speech, or focal weakness. Patient has not experienced this headache again, but does seem to have a good understanding of sings/symptoms associated with stroke given her history of TIA. Discussed calling the clinic should she develop similar headaches more often, as well as indications to call 911 or go to the emergency department. Will monitor.       Choledocholithiasis   Patient expressed concerns regarding recent imaging (RUQ US  and abdominal MRI) which were ordered by her gastroenterologist for intermittent right upper quadrant discomfort. Per patient, she has been experiencing a dull, deep pain that comes and goes for some months. Not associated with nausea/vomiting, doesn't recall if associated with meals. GI doctor, Dr. Yvone Herd, ordered an ultrasound in February 2025 that showed cholelithiasis without  indication of acute cholecystitis, also showed chronic distension of the CBD without visible stones. When symptoms did not resolve, an MRI was performed in March 2025 that showed choledocholithiasis with two suspected distal CBD stones measuring up to 12 mm. Recommended ERCP as clinically warranted.   Reviewed results with patient, as patient is concerned something needs to be done now. Explained that her symptoms seem to be intermittent, and her GI doctor instructed her to call for next steps should her symptoms become more consistent or worsen in quality. Reviewed signs/symptoms of cholecystitis vs cholangitis. Patient acknowledged understanding. Also provided patient with educational materials on ERCP since she seemed anxious about it being mentioned in the imaging results.  Plan - Continue to monitor, patient understands she needs to call back Dr. Yvone Herd if she develops a gradual worsening in symptoms, OR she can go to the emergency department if symptoms worsen suddenly      Patient discussed with Dr. Jarvis Mesa.  Return if symptoms worsen or fail to improve.   Bao Coreas Arellano Zameza, MD

## 2023-09-09 ENCOUNTER — Telehealth: Payer: Self-pay | Admitting: *Deleted

## 2023-09-09 NOTE — Progress Notes (Signed)
 Complex Care Management Note Care Guide Note  09/09/2023 Name: ANELLY SAMARIN MRN: [id number] DOB: November 28, 1929   Complex Care Management Outreach Attempts: An unsuccessful telephone outreach was attempted today to offer the patient information about available complex care management services.  Follow Up Plan:  Additional outreach attempts will be made to offer the patient complex care management information and services.   Encounter Outcome:  No Answer  Barnie Bora  [facility]  [facility], [facility] Guide  Direct Dial: [phone]  Fax [phone]

## 2023-09-10 DIAGNOSIS — K8050 Calculus of bile duct without cholangitis or cholecystitis without obstruction: Secondary | ICD-10-CM | POA: Insufficient documentation

## 2023-09-10 NOTE — Assessment & Plan Note (Signed)
 Patient shared she experienced a sharp headache that lasted about 20 minutes a few days ago. Patient denied any associated symptoms such as vision changes, slurred speech, or focal weakness. Patient has not experienced this headache again, but does seem to have a good understanding of sings/symptoms associated with stroke given her history of TIA. Discussed calling the clinic should she develop similar headaches more often, as well as indications to call 911 or go to the emergency department. Will monitor.

## 2023-09-10 NOTE — Assessment & Plan Note (Addendum)
 Patient endorsed some anxiety regarding worry she is infected with parasites. When asked why she feels that way she explained she read 'parasite' on some paperwork she received at a previous visit. Chart review indicates it may have been on the differential for a vaginal infection. Reviewed recent CBC with diff which showed no concern for present infection, discussed eosinophils WNL. Patient still seemed anxious and asked if spots on her skin were related to parasites. On physical exam, these spots appeared to be consistent with actinic keratoses. Explained that these were due to sun damage, recommended further evaluation by dermatologist. Patient seemed to be more reassured following this explanation.   Patient dose endorse taking buspirone  5 mg BID and says it's helpful for her anxiety. Due to anxiety revolving around other health concerns, discussed resources like talk therapy/behavioral counseling. Patient seemed excited about the idea of getting to talk to someone about her concerns.   Plan - Placed [facility] referral today - Continue buspirone  5 mg BID

## 2023-09-10 NOTE — Assessment & Plan Note (Signed)
 Patient expressed concerns regarding recent imaging (RUQ US  and abdominal MRI) which were ordered by her gastroenterologist for intermittent right upper quadrant discomfort. Per patient, she has been experiencing a dull, deep pain that comes and goes for some months. Not associated with nausea/vomiting, doesn't recall if associated with meals. GI doctor, Dr. Yvone Herd, ordered an ultrasound in February 2025 that showed cholelithiasis without indication of acute cholecystitis, also showed chronic distension of the CBD without visible stones. When symptoms did not resolve, an MRI was performed in March 2025 that showed choledocholithiasis with two suspected distal CBD stones measuring up to 12 mm. Recommended ERCP as clinically warranted.   Reviewed results with patient, as patient is concerned something needs to be done now. Explained that her symptoms seem to be intermittent, and her GI doctor instructed her to call for next steps should her symptoms become more consistent or worsen in quality. Reviewed signs/symptoms of cholecystitis vs cholangitis. Patient acknowledged understanding. Also provided patient with educational materials on ERCP since she seemed anxious about it being mentioned in the imaging results.  Plan - Continue to monitor, patient understands she needs to call back Dr. Yvone Herd if she develops a gradual worsening in symptoms, OR she can go to the emergency department if symptoms worsen suddenly

## 2023-09-12 NOTE — Progress Notes (Unsigned)
 Complex Care Management Note Care Guide Note  09/12/2023 Name: [name] [name] MRN: [id number] DOB: July 18, 1929   Complex Care Management Outreach Attempts: A second unsuccessful outreach was attempted today to offer the patient with information about available complex care management services.  Follow Up Plan:  Additional outreach attempts will be made to offer the patient complex care management information and services.   Encounter Outcome:  No Answer  Barnie Bora  [facility]  [facility], [facility] Guide  Direct Dial: [phone]  Fax [phone]

## 2023-09-14 NOTE — Progress Notes (Signed)
 Internal Medicine Clinic Attending  Case discussed with the resident at the time of the visit.  We reviewed the resident's history and exam and pertinent patient test results.  I agree with the assessment, diagnosis, and plan of care documented in the resident's note.

## 2023-09-14 NOTE — Progress Notes (Signed)
 Complex Care Management Note Care Guide Note  09/14/2023 Name: [name] [name] MRN: [id number] DOB: 23-Aug-1929   Complex Care Management Outreach Attempts: A third unsuccessful outreach was attempted today to offer the patient with information about available complex care management services.  Follow Up Plan:  No further outreach attempts will be made at this time. We have been unable to contact the patient to offer or enroll patient in complex care management services.  Encounter Outcome:  No Answer  Barnie Bora  [facility]  [facility], [facility] Guide  Direct Dial: [phone]  Fax [phone]

## 2023-09-14 NOTE — Addendum Note (Signed)
 Addended by: Cadynce Garrette L on: 09/14/2023 08:28 AM   Modules accepted: Level of Service

## 2023-11-14 ENCOUNTER — Ambulatory Visit (HOSPITAL_COMMUNITY)
Admission: EM | Admit: 2023-11-14 | Discharge: 2023-11-14 | Disposition: A | Discharge status: Home / Self Care | Attending: Nurse Practitioner | Admitting: Nurse Practitioner

## 2023-11-14 ENCOUNTER — Encounter (HOSPITAL_COMMUNITY): Payer: Self-pay

## 2023-11-14 DIAGNOSIS — H0100A Unspecified blepharitis right eye, upper and lower eyelids: Secondary | ICD-10-CM

## 2023-11-14 DIAGNOSIS — H5789 Other specified disorders of eye and adnexa: Secondary | ICD-10-CM

## 2023-11-14 DIAGNOSIS — H0100B Unspecified blepharitis left eye, upper and lower eyelids: Secondary | ICD-10-CM | POA: Diagnosis not present

## 2023-11-14 MED ORDER — ERYTHROMYCIN 5 MG/GM OP OINT: TOPICAL_OINTMENT | Freq: Every day | OPHTHALMIC | 0 refills | Status: AC

## 2023-11-14 NOTE — ED Provider Notes (Signed)
 [facility]    CSN: [id number] Arrival date & time: 11/14/23  1644      History   Chief Complaint Chief Complaint  Patient presents with   Eye Problem    HPI [name] [name] is a 89 y.o. female.   Discussed the use of AI scribe software for clinical note transcription with the patient, who gave verbal consent to proceed.   [name] [name] is a 89 y.o. female that presents with complaints of eye irritation, swelling, and discomfort, which she believes is caused by mites. The symptoms started last week and have progressively worsened. Her eyes are swollen, and she describes a gritty sensation. The symptoms are worse in the morning, with the patient waking up at 4 AM yesterday and 3:30 AM today to wash her eyes. She notes crusting and flaking of her eyelashes, which she says don't feel normal and have become thin as she has gotten older. The patient experiences soreness on the sides of her eyes and occasional blurry vision due to the crusting of the eyelids. The patient has attempted self-treatment, including scraping her eyelids and using cotton pads from the pharmacy to wash them. She visited two different pharmacies seeking relief. She believes her symptoms may be related to dust in her home, mentioning that she was sick this spring and didn't clean her curtains, although she has since washed them. The patient denies having an eye doctor but mentions having an upcoming appointment with her primary care physician.   The following portions of the patient's history were reviewed and updated as appropriate: allergies, current medications, past family history, past medical history, past social history, past surgical history, and problem list.    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   Venous insufficiency of both lower extremities 06/04/2019   Hyperlipidemia 06/04/2019   History of repair of rectocele 03/13/2019   Palpitations 05/23/2017   Arthritis of knee 03/25/2016   Presbycusis of both ears 01/15/2016   History of TIA (transient ischemic attack)  07/19/2011   PAC (premature atrial contraction) 03/20/2009   ANEMIA-IRON DEFICIENCY 04/02/2008   Anxiety 04/02/2008   Essential hypertension 04/02/2008   Osteoporosis 04/02/2008    Past Surgical History:  Procedure Laterality Date   A&P, enterocele repair     s/p 2003   ABDOMINAL HYSTERECTOMY     BIOPSY  02/05/2020   Procedure: BIOPSY;  Surgeon: Eda Iha, MD;  Location: [facility] ENDOSCOPY;  Service: Gastroenterology;;   BREAST REDUCTION SURGERY     s/p 1996      PATIENT DENIES BREAST REDUCTION    ESOPHAGEAL DILATION  02/05/2020   Procedure:  ESOPHAGEAL DILATION;  Surgeon: Eda Iha, MD;  Location: [facility] ENDOSCOPY;  Service: Gastroenterology;;   ESOPHAGEAL MANOMETRY N/A 05/07/2020   Procedure: ESOPHAGEAL MANOMETRY (EM);  Surgeon: Teressa Toribio SQUIBB, MD;  Location: [facility] ENDOSCOPY;  Service: Endoscopy;  Laterality: N/A;   ESOPHAGOGASTRODUODENOSCOPY (EGD) WITH PROPOFOL  N/A 02/05/2020   Procedure: ESOPHAGOGASTRODUODENOSCOPY (EGD) WITH PROPOFOL ;  Surgeon: Eda Iha, MD;  Location: [facility] ENDOSCOPY;  Service: Gastroenterology;  Laterality: N/A;   HEMORRHOID SURGERY     LEFT HEART CATH AND CORONARY ANGIOGRAPHY N/A 05/25/2017   Procedure: LEFT HEART CATH AND CORONARY ANGIOGRAPHY;  Surgeon: Dann Candyce RAMAN, MD;  Location: [facility] INVASIVE CV LAB;  Service: Cardiovascular;  Laterality: N/A;    OB History     Gravida  2   Para  2   Term      Preterm      AB      Living  1      SAB      IAB      Ectopic      Multiple      Live Births               Home Medications    Prior to Admission medications   Medication Sig Start Date End Date Taking? Authorizing Provider  erythromycin ophthalmic ointment Place into both eyes at bedtime for 14 days. Apply a small amount to eyelid margins of eyes at bedtime for two weeks 11/14/23 11/28/23 Yes Haliegh Khurana, FNP  busPIRone  (BUSPAR ) 5 MG tablet TAKE 1 TABLET TWICE A DAY 02/09/23   Amoako, Prince, MD  flecainide  (TAMBOCOR ) 50 MG tablet TAKE 2 TABLETS EVERY MORNING AND 1 TABLET AT NIGHT 01/19/23   Waddell Danelle ORN, MD  hydrocortisone  cream 1 % Apply to affected area 2 times daily up to 2 weeks at a time then take a break. 09/08/23 09/07/24  Arellano Zameza, Priscila, MD  Replens Vaginal Moisturizer GEL Place 1 Application vaginally every morning. 08/13/22   Finis Stallion, MD    Family History Family History  Problem Relation Age of Onset   Stroke Father        @ 51   Hypertension Father    Hypertension Sister    Cancer Sister 55       brain   Breast cancer Other 17        breast   Breast cancer Daughter 102       spread to her brain   Cancer Other 60       colon/MELANOMA   Stroke Mother        medication induced   Kidney disease Brother    Cancer Brother     Social History Social History   Tobacco Use   Smoking status: Former    Current packs/day: 0.00    Types: Cigarettes    Quit date: 05/24/1968    Years since quitting: 55.5  Smokeless tobacco: Never   Tobacco comments:    only smoked 7 years  Vaping Use   Vaping status: Never Used  Substance Use Topics   Alcohol use: No    Alcohol/week: 0.0 standard drinks of alcohol   Drug use: No     Allergies   Other, Pantoprazole , Zyrtec  [cetirizine ], Ciprofloxacin , and Nifedipine    Review of Systems Review of Systems  Constitutional:  Negative for fever.  HENT: Negative.    Eyes:  Positive for pain, discharge, itching and visual disturbance. Negative for photophobia and redness.  Gastrointestinal:  Negative for nausea and vomiting.  Musculoskeletal:  Negative for gait problem.  Skin:  Negative for rash.  Neurological:  Negative for syncope, weakness, light-headedness, numbness and headaches.  All other systems reviewed and are negative.    Physical Exam Triage Vital Signs ED Triage Vitals  Encounter Vitals Group     BP 11/14/23 1804 (!) 162/87     Girls Systolic BP Percentile --      Girls Diastolic BP Percentile --      Boys Systolic BP Percentile --      Boys Diastolic BP Percentile --      Pulse Rate 11/14/23 1804 78     Resp 11/14/23 1804 16     Temp 11/14/23 1804 98.7 F (37.1 C)     Temp Source 11/14/23 1804 Oral     SpO2 11/14/23 1804 97 %     Weight --      Height --      Head Circumference --      Peak Flow --      Pain Score 11/14/23 1805 6     Pain Loc --      Pain Education --      Exclude from Growth Chart --    No data found.  Updated Vital Signs BP (!) 179/93 (BP Location: Right Arm)   Pulse 78   Temp 98.7 F (37.1 C) (Oral)   Resp 16   LMP  05/24/1970   SpO2 97%   Visual Acuity Right Eye Distance:   Left Eye Distance:   Bilateral Distance:    Right Eye Near:   Left Eye Near:    Bilateral Near:     Physical Exam Vitals reviewed.  Constitutional:      General: She is not in acute distress.    Appearance: Normal appearance. She is not toxic-appearing.  HENT:     Head: Normocephalic.     Mouth/Throat:     Mouth: Mucous membranes are moist.   Eyes:     General: Lids are normal. Lids are everted, no foreign bodies appreciated. Vision grossly intact. Gaze aligned appropriately. No visual field deficit.       Right eye: No foreign body, discharge or hordeolum.        Left eye: No foreign body, discharge or hordeolum.     Conjunctiva/sclera: Conjunctivae normal.     Right eye: Right conjunctiva is not injected. No exudate.    Left eye: Left conjunctiva is not injected. No exudate.    Pupils: Pupils are equal, round, and reactive to light.    Cardiovascular:     Rate and Rhythm: Normal rate and regular rhythm.     Heart sounds: Normal heart sounds.  Pulmonary:     Effort: Pulmonary effort is normal.     Breath sounds: Normal breath sounds.   Musculoskeletal:        General: Normal range of motion.  Skin:    General: Skin is warm and dry.   Neurological:     General: No focal deficit present.     Mental Status: She is alert and oriented to person, place, and time.      UC Treatments / Results  Labs (all labs ordered are listed, but only abnormal results are displayed) Labs Reviewed - No data to display  EKG   Radiology No results found.  Procedures Procedures (including critical care time)  Medications Ordered in UC Medications - No data to display  Initial Impression / Assessment and Plan / UC Course  I have reviewed the triage vital signs and the nursing notes.  Pertinent labs & imaging results that were available during my care of the patient were reviewed by me and considered in my  medical decision making (see chart for details).     The patient presents with symptoms consistent with blepharitis, including a gritty sensation in the eyes, crusting and flaking of the eyelashes, swollen eyelids, eye soreness, and occasional blurry vision, which are worse in the mornings. Symptoms began last week, and the patient attempted self-treatment over the weekend. While the patient suspects a mite-related cause, no visible evidence of mites was noted on examination. Differential diagnosis includes blepharitis, dry eye syndrome, and possible allergic conjunctivitis. Treatment includes erythromycin ophthalmic ointment to be applied to the eyelid margins at bedtime for 2-3 weeks. The patient was advised to cleanse the eyelids daily using a tear-free baby shampoo, apply artificial tears throughout the day for dryness, and gently wipe eyelids with unscented baby wipes or a clean washcloth. Follow-up with an eye care provider is recommended if symptoms do not improve. ED evaluation is warranted for worsening pain, vision loss, or signs of ocular infection.  Today's evaluation has revealed no signs of a dangerous process. Discussed diagnosis with patient and/or guardian. Patient and/or guardian aware of their diagnosis, possible red flag symptoms to watch out for and need for close follow up. Patient and/or guardian understands verbal and written discharge instructions. Patient and/or guardian comfortable with plan and disposition.  Patient and/or guardian has a clear mental status at this time, good insight into illness (after discussion and teaching) and has clear judgment to make decisions regarding their care  Documentation was completed with the aid of voice recognition software. Transcription may contain typographical errors. Final Clinical Impressions(s) / UC Diagnoses   Final diagnoses:  Eye irritation  Blepharitis of upper and lower eyelids of both eyes, unspecified type     Discharge  Instructions      You were seen today for symptoms consistent with blepharitis, including a gritty feeling in your eyes, crusting and flaking around the eyelashes, eyelid swelling, soreness, and occasional blurry vision, especially in the morning. You have been prescribed erythromycin ophthalmic ointment, which should be applied to the eyelid margins at bedtime for 2 to 3 weeks. For symptom relief, gently cleanse your eyelids throughout the day using a tear-free baby shampoo diluted with warm water. You should use a clean washcloth or unscented baby wipes to remove buildup along the lash line. Gently scrub eyelid margins daily using a tea tree oil-based lid scrub (e.g., Cliradex wipes or foam). Apply artificial tears throughout the day as needed for dryness. Avoid rubbing your eyes and do not use any makeup to the eye area until symptoms resolve. These steps should improve your symptoms gradually. Follow up with an eye doctor if you do not notice improvement within a few weeks or if  symptoms worsen. Go to the emergency room if you experience severe eye pain, sudden vision loss, increased redness, or any signs of infection such as pus or swelling that worsens quickly.      ED Prescriptions     Medication Sig Dispense Auth. Provider   erythromycin ophthalmic ointment Place into both eyes at bedtime for 14 days. Apply a small amount to eyelid margins of eyes at bedtime for two weeks 3.5 g Iola Lukes, FNP      PDMP not reviewed this encounter.   Iola Lukes, [place] 11/14/23 [phone]

## 2023-11-14 NOTE — Discharge Instructions (Addendum)
 You were seen today for symptoms consistent with blepharitis, including a gritty feeling in your eyes, crusting and flaking around the eyelashes, eyelid swelling, soreness, and occasional blurry vision, especially in the morning. You have been prescribed erythromycin ophthalmic ointment, which should be applied to the eyelid margins at bedtime for 2 to 3 weeks. For symptom relief, gently cleanse your eyelids throughout the day using a tear-free baby shampoo diluted with warm water. You should use a clean washcloth or unscented baby wipes to remove buildup along the lash line. Gently scrub eyelid margins daily using a tea tree oil-based lid scrub (e.g., Cliradex wipes or foam). Apply artificial tears throughout the day as needed for dryness. Avoid rubbing your eyes and do not use any makeup to the eye area until symptoms resolve. These steps should improve your symptoms gradually. Follow up with an eye doctor if you do not notice improvement within a few weeks or if symptoms worsen. Go to the emergency room if you experience severe eye pain, sudden vision loss, increased redness, or any signs of infection such as pus or swelling that worsens quickly.

## 2023-11-14 NOTE — ED Triage Notes (Signed)
 Patient here today with c/o bilat eye pain and swelling on and off X 2 weeks but has worsened since Friday. Patient states that she has mites on her eyes.

## 2023-11-22 ENCOUNTER — Ambulatory Visit: Payer: Self-pay | Admitting: Student

## 2023-12-06 ENCOUNTER — Other Ambulatory Visit: Payer: Self-pay

## 2023-12-06 MED ORDER — FLECAINIDE ACETATE 50 MG PO TABS: ORAL_TABLET | ORAL | 1 refills | Status: DC

## 2023-12-08 ENCOUNTER — Emergency Department (HOSPITAL_COMMUNITY)
Admission: EM | Admit: 2023-12-08 | Discharge: 2023-12-08 | Disposition: A | Discharge status: Home / Self Care | Attending: Emergency Medicine | Admitting: Emergency Medicine

## 2023-12-08 ENCOUNTER — Emergency Department (HOSPITAL_COMMUNITY)

## 2023-12-08 DIAGNOSIS — M79604 Pain in right leg: Secondary | ICD-10-CM | POA: Diagnosis not present

## 2023-12-08 DIAGNOSIS — I10 Essential (primary) hypertension: Secondary | ICD-10-CM | POA: Diagnosis not present

## 2023-12-08 DIAGNOSIS — Z85828 Personal history of other malignant neoplasm of skin: Secondary | ICD-10-CM | POA: Insufficient documentation

## 2023-12-08 DIAGNOSIS — R609 Edema, unspecified: Secondary | ICD-10-CM | POA: Diagnosis not present

## 2023-12-08 DIAGNOSIS — F17210 Nicotine dependence, cigarettes, uncomplicated: Secondary | ICD-10-CM | POA: Diagnosis not present

## 2023-12-08 DIAGNOSIS — M79661 Pain in right lower leg: Secondary | ICD-10-CM | POA: Diagnosis not present

## 2023-12-08 DIAGNOSIS — M1711 Unilateral primary osteoarthritis, right knee: Secondary | ICD-10-CM | POA: Diagnosis not present

## 2023-12-08 LAB — D-DIMER, QUANTITATIVE: D-Dimer, Quant: 1.06 ug{FEU}/mL — ABNORMAL HIGH (ref 0.00–0.50)

## 2023-12-08 MED ORDER — ACETAMINOPHEN 500 MG PO TABS
1000.0000 mg | ORAL_TABLET | Freq: Once | ORAL | Status: AC
  Administered 2023-12-08: 1000 mg via ORAL
  Filled 2023-12-08: qty 2

## 2023-12-08 NOTE — ED Provider Notes (Signed)
 [place] [facility] AT [facility] Provider Note  CSN: [id number] Arrival date & time: 12/08/23 2050  Chief Complaint(s) Leg Pain  HPI [name] [name] is a 89 y.o. female here today for 9 days of pain on the right lateral leg.  Patient denies any trauma to the area, does states she has been doing more housework recently, has had some increasing pain in the area.  She has a history of varicose veins in both of her legs.  She is not on any blood thinners.   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 Authorizing Provider  busPIRone  (BUSPAR ) 5 MG tablet TAKE 1 TABLET TWICE A DAY 02/09/23   Renne Homans, MD  flecainide  (TAMBOCOR ) 50 MG tablet TAKE 2 TABLETS EVERY MORNING AND 1 TABLET AT NIGHT 12/06/23   Waddell Danelle ORN, MD  hydrocortisone  cream 1 % Apply to affected area 2 times daily up to 2 weeks at a time then take a break. 09/08/23 09/07/24  Arellano Zameza, Priscila, MD  Replens Vaginal Moisturizer GEL Place 1 Application vaginally every morning. 08/13/22   Katsadouros, Vasilios, MD  Past Surgical History Past Surgical History:  Procedure Laterality Date   A&P, enterocele repair     s/p 2003   ABDOMINAL HYSTERECTOMY     BIOPSY  02/05/2020   Procedure: BIOPSY;  Surgeon: Eda Iha, MD;  Location: [facility] ENDOSCOPY;  Service: Gastroenterology;;   BREAST REDUCTION SURGERY     s/p 1996      PATIENT DENIES BREAST REDUCTION    ESOPHAGEAL DILATION  02/05/2020   Procedure: ESOPHAGEAL DILATION;  Surgeon: Eda Iha, MD;  Location: [facility] ENDOSCOPY;  Service: Gastroenterology;;   ESOPHAGEAL MANOMETRY N/A 05/07/2020   Procedure: ESOPHAGEAL MANOMETRY (EM);  Surgeon: Teressa Toribio SQUIBB, MD;  Location: [facility] ENDOSCOPY;  Service: Endoscopy;  Laterality: N/A;   ESOPHAGOGASTRODUODENOSCOPY (EGD) WITH PROPOFOL  N/A 02/05/2020   Procedure: ESOPHAGOGASTRODUODENOSCOPY (EGD) WITH PROPOFOL ;  Surgeon: Eda Iha, MD;   Location: [facility] ENDOSCOPY;  Service: Gastroenterology;  Laterality: N/A;   HEMORRHOID SURGERY     LEFT HEART CATH AND CORONARY ANGIOGRAPHY N/A 05/25/2017   Procedure: LEFT HEART CATH AND CORONARY ANGIOGRAPHY;  Surgeon: Dann Candyce RAMAN, MD;  Location: [facility] INVASIVE CV LAB;  Service: Cardiovascular;  Laterality: N/A;   Family History Family History  Problem Relation Age of Onset   Stroke Father        @ 23   Hypertension Father    Hypertension Sister    Cancer Sister 50       brain   Breast cancer Other 52       breast   Breast cancer Daughter 72       spread to her brain   Cancer Other 70       colon/MELANOMA   Stroke Mother        medication induced   Kidney disease Brother    Cancer Brother     Social History Social History   Tobacco Use   Smoking status: Former    Current packs/day: 0.00    Types: Cigarettes    Quit date: 05/24/1968    Years since quitting: 55.5   Smokeless tobacco: Never   Tobacco comments:    only smoked 7 years  Vaping Use   Vaping status: Never Used  Substance Use Topics   Alcohol use: No    Alcohol/week: 0.0 standard drinks of alcohol   Drug use: No   Allergies Other, Pantoprazole , Zyrtec  [cetirizine ], Ciprofloxacin , and Nifedipine   Review of Systems Review of Systems  Physical Exam Vital Signs  I have reviewed the triage vital signs BP (!) 141/70 (BP Location: Right Arm)   Pulse 77   Temp 98.6 F (37 C) (Oral)   Resp 18   LMP 05/24/1970   SpO2 97%   Physical Exam Vitals reviewed.  Musculoskeletal:        General: No swelling or tenderness.  Skin:    General: Skin is warm and dry.     Findings: No bruising, erythema, lesion or rash.     ED Results and Treatments Labs (all labs ordered are listed, but only abnormal results are displayed) Labs Reviewed  D-DIMER, QUANTITATIVE - Abnormal; Notable for the following components:      Result Value   D-Dimer, Quant 1.06 (*)    All other components within normal limits  Radiology DG Tibia/Fibula Right Result Date: 12/08/2023 CLINICAL DATA:  Pain EXAM: RIGHT TIBIA AND FIBULA - 2 VIEW COMPARISON:  None Available. FINDINGS: There is no evidence of fracture or other focal bone lesions. Peripheral vascular calcifications are present. There are mild degenerative changes of the lateral compartment of the knee. IMPRESSION: 1. No acute fracture or dislocation. 2. Mild degenerative changes of the lateral compartment of the knee. Electronically Signed   By: Greig Pique M.D.   On: 12/08/2023 21:55    Pertinent labs & imaging results that were available during my care of the patient were reviewed by me and considered in my medical decision making (see MDM for details).  Medications Ordered in ED Medications  acetaminophen  (TYLENOL ) tablet 1,000 mg (1,000 mg Oral Given 12/08/23 2202)                                                                                                                                     Procedures Procedures  (including critical care time)  Medical Decision Making / ED Course   This patient presents to the ED for concern of right leg pain, this involves an extensive number of treatment options, and is a complaint that carries with it a high risk of complications and morbidity.  The differential diagnosis includes musculoskeletal pain, DVT, less likely cellulitis, Baker's cyst.  MDM: Patient's leg overall normal in appearance.  She has no specific tenderness.  There is no discoloration, good pulses, good warmth.  Do not have ultrasound availability here tonight, we will check a D-dimer on the patient to risk stratify patient.  Would be reluctant to start this patient on a blood thinner without a definitive diagnosis.  Plain films of the tibia ordered.  Reassessment 10:45 PM-patient's plain films negative.  Her D-dimer is only  mildly elevated at 1.06.  With the patient's exam, advanced age, I think the risk of empirically starting the patient on blood thinners pending formal ultrasound outweighs the risk of delayed diagnosis.  Patient is going to follow-up with her PCP, has no concerns about getting into see her PCP.  Will discharge.     Additional history obtained: -Additional history obtained from EMS -External records from outside source obtained and reviewed including: Chart review including previous notes, labs, imaging, consultation notes   Lab Tests: -I ordered, reviewed, and interpreted labs.   The pertinent results include:   Labs Reviewed  D-DIMER, QUANTITATIVE - Abnormal; Notable for the following components:      Result Value   D-Dimer, Quant 1.06 (*)    All other components within normal limits       Imaging Studies ordered: I ordered imaging studies including plain films of the tibia I independently visualized and interpreted imaging. I agree with the radiologist interpretation   Medicines ordered and prescription drug management: Meds ordered this encounter  Medications   acetaminophen  (TYLENOL ) tablet 1,000 mg    -I  have reviewed the patients home medicines and have made adjustments as needed   Cardiac Monitoring: The patient was maintained on a cardiac monitor.  I personally viewed and interpreted the cardiac monitored which showed an underlying rhythm of: Normal sinus rhythm   Reevaluation: After the interventions noted above, I reevaluated the patient and found that they have :improved  Co morbidities that complicate the patient evaluation  Past Medical History:  Diagnosis Date   Anemia    iron def   Anxiety    Arrhythmia    paroxysmal SVT and symtomatic PAC's   ATRIAL PREMATURE BEATS 03/20/2009       Waddell, MD, CODY, Danelle Fallow      Cerumen impaction 08/27/2021   Common bile duct dilation 04/02/2021   Diverticulosis    colon   Dysuria 03/07/2019   Elevated  troponin    Gastritis and gastroduodenitis    GERD 04/02/2008   Qualifier: Diagnosis of  By: Norleen MD, Lynwood ORN    History of colonic polyps    cecal   Hypertension    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   Vitamin D  deficiency    2010      Dispostion: I considered admission for this patient, however she is appropriate for discharge.     Final Clinical Impression(s) / ED Diagnoses Final diagnoses:  Right leg pain     @PCDICTATION @    Mannie Pac T, DO 12/08/23 2247

## 2023-12-08 NOTE — ED Triage Notes (Addendum)
 Pt coming from home reports R leg pain for the past 8 days. Hx of pain, swelling. New changes of a lump in the calf reported by Pt. Unable to notice anything by EMS. Pt alert  EMS vs  BP 156/76 hr 82 02 96

## 2023-12-08 NOTE — Discharge Instructions (Addendum)
 While you were in the emergency room, you had a x-ray of your leg that was negative.  Your blood test showed a slightly elevated D-dimer, but only minimally so.  I am recommending that you call your primary care doctor to schedule an ultrasound of your right leg.  Return to the emergency room if you develop increasing pain, swelling of your leg, or difficulty with your breathing.

## 2023-12-15 ENCOUNTER — Ambulatory Visit: Admitting: Student

## 2023-12-15 VITALS — BP 155/77 | HR 80 | Temp 98.0°F | Wt 144.8 lb

## 2023-12-15 DIAGNOSIS — I872 Venous insufficiency (chronic) (peripheral): Secondary | ICD-10-CM

## 2023-12-15 DIAGNOSIS — L989 Disorder of the skin and subcutaneous tissue, unspecified: Secondary | ICD-10-CM | POA: Diagnosis not present

## 2023-12-15 NOTE — Progress Notes (Unsigned)
   Established Patient Office Visit  Subjective   Patient ID: KINZLEIGH KANDLER, female    DOB: [birth year]  Age: 89 y.o. MRN: [id number]  No chief complaint on file.   Ms.Laryah C Spivak is a 89 y.o. F with PMHx anxiety, history of TIA, PAC/PAF, chronic aphasia, choledocholithiasis presenting today for   Review of Systems:  As per assessment and Plan   Objective:     There were no vitals filed for this visit. ***  Physical Exam General: Sitting in chair, no acute distress Cardiovascular: Regular rate Pulmonary: Breathing comfortably Abdomen: Soft, nontender, nondistended MSK: No lower extremity edema bilaterally  {Labs (Optional):23779}  The ASCVD Risk score (Arnett DK, et al., 2019) failed to calculate for the following reasons:   The 2019 ASCVD risk score is only valid for ages 38 to 38    Assessment & Plan:   Patient {GC/GE:[id number]::discussed with,seen with} Dr. {NAMES:[id number]::Chambliss,Chun,Hoffman,Lau,Machen,Narendra,Williams,Winfrey}  LE swelling?  Evaluate in the ED 12/08/2023 Prior hx of venous insufficiency of bilateral lower extremity.  Patient was evaluated in the ED, D-dimer 1.06, based on age adjusted cut off - VTE likely Well's score for DVT : 1 (varicose veins).  X-ray of right tib-fib noted mild degenerative changes in the lateral compartment, no acute fracture or dislocation.  Reports she scooped down low, reports she got up wrong, reports soreness popliteal early with swelling. Reports swelling has improved. Denies any calf tenderness. No gait abnormalities.   ECHO 05/2017 with LVEF 65 to 70%, normal wall motion, grade 1 diastolic dysfunction. Left heart cath 05/2017 findings of nonobstructive CAD in the LAD. Has had multiple ultrasound of the lower extremity, most recent in 2022 without evidence of DVT.   Skin Rash  Macupapular rash along the left neck, light brown plaque.  -     Problem List Items Addressed This Visit   None   No  follow-ups on file.    Toma Edwards, DO

## 2023-12-15 NOTE — Patient Instructions (Signed)
 Thank you, Ms.Nichole BROCKS Tabar for allowing us  to provide your care today.  If you note that your symptoms are getting worse, for example the swelling in your legs, worsening pain, redness. Please return to clinic    I have ordered the following labs for you:  Lab Orders  No laboratory test(s) ordered today     Tests ordered today:  none   Referrals ordered today:   Referral Orders  No referral(s) requested today     I have ordered the following medication/changed the following medications:   Stop the following medications: There are no discontinued medications.   Start the following medications: No orders of the defined types were placed in this encounter.    Follow up: Please return to clinic if your symptoms worsen     Remember:   Should you have any questions or concerns please call the internal medicine clinic at [phone].     Rayann Atway, D.O. [facility]

## 2023-12-16 NOTE — Assessment & Plan Note (Signed)
 This is a pleasant 89 year old female who is presenting to the clinic today for follow-up after an ED visit on 12/08/2023 for the evaluation of the right lower extremity swelling.  Patient reports that about 4 days prior to the ED visit, she bent down, while standing back up, she placed her foot in the wrong way, reports right afterwards she felt soreness at her right popliteal area with swelling the next couple of days.  Reports on the day that she went to ED, she woke up with right lower extremity swelling, she went to the ED in the evening where they evaluated the patient, during the ED physician exam, there was no notable lower extremity swelling pain or erythema.  D-dimer was 1.06, based on age-adjusted cut off, unlikely VTE.  Wells score for DVT 1 (varicose veins).  She also had an x-ray of right tib-fib, that noted for mild degenerative changes in the lateral compartment, no acute fracture or dislocation.  On exam today, no lower extremity swelling is noted, she has DP pulses that intact, no erythema or calf tenderness is noted.  Gait is normal.  She has what appears to be a Baker's cyst at the right popliteal area, that has been present for several years.  There is no tenderness noted.  Exam is otherwise benign.  Patient was able to walk to clinic without any abnormalities or pain.  Patient reports that she feels better today.

## 2023-12-16 NOTE — Assessment & Plan Note (Signed)
 Patient reports that she has this macular papular rash/lesion along the left neck, with light brown plaques that appears to be seborrheic keratosis.  She does has past medical history of squamous cell carcinoma of skin with actinic keratosis.  Patient reports that these rashes have been present since January, it is not changing in shape or size.  However she is concerned that it is probably parasitic, on exam patient was reassured that is not a parasitic infection though she needs to follow-up with her dermatologist. - Follow-up with dermatologist

## 2023-12-19 NOTE — Progress Notes (Signed)
 Internal Medicine Clinic Attending  Case discussed with the resident at the time of the visit.  We reviewed the resident's history and exam and pertinent patient test results.  I agree with the assessment, diagnosis, and plan of care documented in the resident's note.

## 2023-12-19 NOTE — Addendum Note (Signed)
 Addended by: KARNA FELLOWS on: 12/19/2023 10:58 AM   Modules accepted: Level of Service

## 2023-12-26 ENCOUNTER — Encounter (HOSPITAL_BASED_OUTPATIENT_CLINIC_OR_DEPARTMENT_OTHER): Payer: Self-pay

## 2023-12-28 ENCOUNTER — Ambulatory Visit: Attending: Internal Medicine | Admitting: Internal Medicine

## 2023-12-28 ENCOUNTER — Encounter: Payer: Self-pay | Admitting: Internal Medicine

## 2023-12-28 VITALS — BP 162/66 | HR 76 | Ht 65.5 in | Wt 147.8 lb

## 2023-12-28 DIAGNOSIS — R002 Palpitations: Secondary | ICD-10-CM | POA: Diagnosis not present

## 2023-12-28 DIAGNOSIS — I491 Atrial premature depolarization: Secondary | ICD-10-CM | POA: Diagnosis not present

## 2023-12-28 NOTE — Progress Notes (Addendum)
 HPI [name] [name] returns today for followup. She has a h/o PAF and palpitations and PAC's. She also has a h/o chronic aphagia. She also has esophageal spasm. She has not had palpitations. She had lost 30 lbs but has regained most of this. She has become more sedentary and has fallen twice with stitches. She is no longer on an OAC.  Allergies  Allergen Reactions   Other Anaphylaxis    Reaction to GI Cocktail   Pantoprazole  Other (See Comments)    Upset stomach and weight loss   Zyrtec  [Cetirizine ] Palpitations   Ciprofloxacin      Rash with burning all over body   Nifedipine      Caused low BP     Current Outpatient Medications  Medication Sig Dispense Refill   busPIRone  (BUSPAR ) 5 MG tablet TAKE 1 TABLET TWICE A DAY 180 tablet 3   flecainide  (TAMBOCOR ) 50 MG tablet TAKE 2 TABLETS EVERY MORNING AND 1 TABLET AT NIGHT 270 tablet 1   hydrocortisone  cream 1 % Apply to affected area 2 times daily up to 2 weeks at a time then take a break. 30 g 1   Replens Vaginal Moisturizer GEL Place 1 Application vaginally every morning. 6.7 g 1   No current facility-administered medications for this visit.     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   Skin cancer of arm 09/26/2014   Splenic artery aneurysm  (HCC) 04/02/2021   Stricture esophagus    TMJ pain dysfunction syndrome 09/25/2018   Vitamin D  deficiency    2010    ROS:   All systems reviewed and negative except as noted in the HPI.   Past Surgical History:  Procedure Laterality Date   A&P, enterocele repair     s/p 2003   ABDOMINAL HYSTERECTOMY     BIOPSY  02/05/2020   Procedure: BIOPSY;  Surgeon: Eda Iha, MD;  Location: [facility] ENDOSCOPY;  Service: Gastroenterology;;   BREAST REDUCTION SURGERY     s/p 1996      PATIENT DENIES BREAST REDUCTION    ESOPHAGEAL DILATION  02/05/2020   Procedure: ESOPHAGEAL DILATION;  Surgeon: Eda Iha, MD;  Location: [facility] ENDOSCOPY;  Service: Gastroenterology;;   ESOPHAGEAL MANOMETRY N/A 05/07/2020   Procedure: ESOPHAGEAL MANOMETRY (EM);  Surgeon: Teressa Toribio SQUIBB, MD;  Location: [facility] ENDOSCOPY;  Service: Endoscopy;  Laterality: N/A;   ESOPHAGOGASTRODUODENOSCOPY (EGD) WITH PROPOFOL  N/A 02/05/2020   Procedure: ESOPHAGOGASTRODUODENOSCOPY (EGD) WITH PROPOFOL ;  Surgeon: Eda Iha, MD;  Location: [facility] ENDOSCOPY;  Service: Gastroenterology;  Laterality: N/A;   HEMORRHOID SURGERY     LEFT HEART CATH AND CORONARY ANGIOGRAPHY N/A  05/25/2017   Procedure: LEFT HEART CATH AND CORONARY ANGIOGRAPHY;  Surgeon: Dann Candyce RAMAN, MD;  Location: [facility] INVASIVE CV LAB;  Service: Cardiovascular;  Laterality: N/A;     Family History  Problem Relation Age of Onset   Stroke Father        @ 52   Hypertension Father    Hypertension Sister    Cancer Sister 27       brain   Breast cancer Other 17       breast   Breast cancer Daughter 22       spread to her brain   Cancer Other 64       colon/MELANOMA   Stroke Mother        medication induced   Kidney disease Brother    Cancer Brother      Social History   Socioeconomic History   Marital status: Widowed    Spouse name: Not on file   Number of children: 2   Years of education: Not on file   Highest education level: Not on file   Occupational History   Occupation: Retired  Tobacco Use   Smoking status: Former    Current packs/day: 0.00    Types: Cigarettes    Quit date: 05/24/1968    Years since quitting: 55.6   Smokeless tobacco: Never   Tobacco comments:    only smoked 7 years  Vaping Use   Vaping status: Never Used  Substance and Sexual Activity   Alcohol use: No    Alcohol/week: 0.0 standard drinks of alcohol   Drug use: No   Sexual activity: Never    Comment: 1st intercourse 53 yo-1 partner  Other Topics Concern   Not on file  Social History Narrative   Lives alone   Social Drivers of Health   Financial Resource Strain: Low Risk  (01/19/2023)   Overall Financial Resource Strain (CARDIA)    Difficulty of Paying Living Expenses: Not hard at all  Food Insecurity: No Food Insecurity (01/19/2023)   Hunger Vital Sign    Worried About Running Out of Food in the Last Year: Never true    Ran Out of Food in the Last Year: Never true  Transportation Needs: No Transportation Needs (01/19/2023)   PRAPARE - Administrator, Civil Service (Medical): No    Lack of Transportation (Non-Medical): No  Physical Activity: Insufficiently Active (01/19/2023)   Exercise Vital Sign    Days of Exercise per Week: 3 days    Minutes of Exercise per Session: 20 min  Stress: No Stress Concern Present (01/19/2023)   [employer] of Occupational Health - Occupational Stress Questionnaire    Feeling of Stress : Not at all  Social Connections: Socially Isolated (01/19/2023)   Social Connection and Isolation Panel    Frequency of Communication with Friends and Family: Never    Frequency of Social Gatherings with Friends and Family: More than three times a week    Attends Religious Services: Never    Database administrator or Organizations: No    Attends Banker Meetings: Never    Marital Status: Widowed  Intimate Partner Violence: Patient Unable To Answer (01/19/2023)   Humiliation, Afraid, Rape, and  Kick questionnaire    Fear of Current or Ex-Partner: Patient unable to answer    Emotionally Abused: Patient unable to answer    Physically Abused: Patient unable to answer    Sexually Abused: Patient unable to answer  BP (!) 162/66   Pulse 76   Ht 5' 5.5 (1.664 m)   Wt 147 lb 12.8 oz (67 kg)   LMP 05/24/1970   SpO2 97%   BMI 24.22 kg/m   Physical Exam:  Well appearing NAD HEENT: Unremarkable Neck:  No JVD, no thyromegally Lymphatics:  No adenopathy Back:  No CVA tenderness Lungs:  Clear HEART:  Regular rate rhythm, no murmurs, no rubs, no clicks Abd:  soft, positive bowel sounds, no organomegally, no rebound, no guarding Ext:  2 plus pulses, no edema, no cyanosis, no clubbing Skin:  No rashes no nodules Neuro:  CN II through XII intact, motor grossly intact  EKG - nsr with no change from last ECG.    Assess/Plan:  PAC's/PAF - she is doing well and will continue her flecainide  which she has been taking for over 20 years.  HTN - her sbp is elevated. However she has had trouble with low bp in the past. She may require additional bp lowering. Bradycardia - she wore a zio in December and no afib, pauses or arrhythmias were noted.   [name] Kylan Veach,MD

## 2023-12-28 NOTE — Patient Instructions (Signed)
 Medication Instructions:  Your physician recommends that you continue on your current medications as directed. Please refer to the Current Medication list given to you today.  *If you need a refill on your cardiac medications before your next appointment, please call your pharmacy*  Lab Work: None ordered.  You may go to any [facility] Location for your lab work:  [facility] - [address] ([facility]) - [address] [phone] [address]  [place] - [address]   [place]  - [address]   [place] - [address] - [address] [employer]  - [address] [place] [address] ([facility]   If you have labs (blood work) drawn today and your tests are completely normal, you will receive your results only by: [employer] (if you have MyChart)  If you have any lab test that is abnormal or we need to change your treatment, we will call you or send a MyChart message to review the results.  Testing/Procedures: None ordered.  Follow-Up: At [facility], you and your health needs are our priority.  As part of our continuing mission to provide you with exceptional heart care, we have created designated Provider Care Teams.  These Care Teams include your primary Cardiologist (physician) and Advanced Practice Providers (APPs -  Physician Assistants and Nurse Practitioners) who all work together to provide you with the care you need, when you need it.  Your next appointment:   1 year(s)  The format for your next appointment:   In Person  Provider:   Donnice Primus, MD or one of the following Advanced Practice Providers on your designated Care Team:   Charlies Arthur, [place] Ozell Jodie Passey, [place] Leotis Barrack, NP  Note: Remote monitoring is used to monitor your Pacemaker/ ICD from home. This monitoring reduces the number of office visits required to check  your device to one time per year. It allows us  to keep an eye on the functioning of your device to ensure it is working properly.

## 2024-02-02 ENCOUNTER — Ambulatory Visit: Payer: Self-pay

## 2024-02-02 NOTE — Telephone Encounter (Signed)
 Pt was called . Pt stated her eyes are dark,like she has blacked eyes; going on x 1 month. Stated she has been using eye lid scrubber pads she bought OTC. Also used erythromycin  abx gel she had received from UC from a previous visit. Pt has an appt 9/16 with Dr Charmayne. Advised pt to go to UC/ED if her symptoms worsen; stated she understands but she will be ok until her appt.

## 2024-02-02 NOTE — Telephone Encounter (Signed)
 FYI Only or Action Required?: Action required by provider: update on patient condition.  Patient was last seen in primary care on 12/15/2023 by Heddy Barren, DO.  Called Nurse Triage reporting Eye Problem and Rash.  Symptoms began a year ago, worsening x 1 month.  Interventions attempted: Prescription medications: eye rx.  Symptoms are: unchanged.  Triage Disposition: See PCP Within 2 Weeks  Patient/caregiver understands and will follow disposition?: Unsure        Copied from CRM [id number]. Topic: Clinical - Red Word Triage >> Feb 02, 2024  2:34 PM Carrielelia G wrote: Red Word that prompted transfer to Nurse Triage: eye irration, blackness on the sclera of the eye, rash on forehead, thinks mites be on the lids. Uncomfortable. Can not get any relief Reason for Disposition  MILD eye pains are a recurrent problem  Answer Assessment - Initial Assessment Questions 1. ONSET: When did the pain start? (e.g., minutes, hours, days)     Last year, worsening x 1 month Recent UC visit with eye Rx-- pt reports completed Rx and initially improved minimal but eye infection still there and has concerns that she has eye mites 2. TIMING: Does the pain come and go, or has it been constant since it started? (e.g., constant, intermittent, fleeting)     constant 3. SEVERITY: How bad is the pain?  (Scale 1-10; mild, moderate or severe)     My eyes today feel like they got dirt in it 4. LOCATION: Where does it hurt?  (e.g., eyelid, eye, cheekbone)     Bilateral eyes 5. CAUSE: What do you think is causing the pain?     Pt thinks mites Endorses frequent eye doctor evals with good vision and may have eye freckles 6. VISION: Do you have blurred vision or changes in your vision?      denies 7. EYE DISCHARGE: Is there any discharge (pus) from the eye(s)?  If Yes, ask: What color is it?      Yes, unknown probably yellow/white 8. FEVER: Do you have a fever? If Yes, ask:  What is it, how was it measured, and when did it start?      denies 9. OTHER SYMPTOMS: Do you have any other symptoms? (e.g., headache, nasal discharge, facial rash)     Facial rash - near hairline near ear Endorses seeing ring/halo around objects, noticed yesterday 10. PREGNANCY: Is there any chance you are pregnant? When was your last menstrual period?       N/a    Upon closing of triage/after scheduling appt, pt did endorse seeing rings/halo around objects noticed yesterday. Triager then strongly advised UC/ED and explained that vision changes is a serious concern. Pt stated that she does not want to go to ED or UC, d/t continuity of care and she has already seen multiple providers for this same issue. Pt prefers [facility] so triager kept initial scheduled appt.   Triager will forward encounter for [facility] 's office to review and advise if sx need more urgent evaluation. Patient verbalized understanding and endorsed that she will call back with any worsening sx.  Protocols used: Eye Pain and Other Symptoms-A-AH

## 2024-02-07 ENCOUNTER — Other Ambulatory Visit: Payer: Self-pay

## 2024-02-07 ENCOUNTER — Ambulatory Visit (INDEPENDENT_AMBULATORY_CARE_PROVIDER_SITE_OTHER): Payer: Self-pay

## 2024-02-07 VITALS — BP 160/82 | HR 71 | Temp 97.7°F | Ht 65.5 in | Wt 146.6 lb

## 2024-02-07 DIAGNOSIS — Z0100 Encounter for examination of eyes and vision without abnormal findings: Secondary | ICD-10-CM | POA: Diagnosis not present

## 2024-02-07 DIAGNOSIS — Z23 Encounter for immunization: Secondary | ICD-10-CM

## 2024-02-07 DIAGNOSIS — I10 Essential (primary) hypertension: Secondary | ICD-10-CM

## 2024-02-07 DIAGNOSIS — Z79899 Other long term (current) drug therapy: Secondary | ICD-10-CM

## 2024-02-07 NOTE — Patient Instructions (Addendum)
 It was wonderful seeing you today!   For your dry eyes.SABRASABRA  1) Hypochlorous acid spray is a gentle sanitizer that will help if you have any mites and will reduce inflammation.   2) Use preservative free eye drops for the dryness. Two brands that ophthalmologists use are Refresh and Ivizia.   3) A gel can be used at night to sooth your eyes and prevent dryness. It can blur your vision so use this at night, not during the day.   For your legs.SABRASABRA  1) Try cerave or cetaphil moisturizing lotions  If you have any questions please feel free to the call the clinic at anytime at [phone].  Have a blessed day,  Dr. Charmayne

## 2024-02-07 NOTE — Progress Notes (Signed)
   Established Patient Office Visit  Subjective   Patient ID: [name] [name], female    DOB: 1929/11/05  Age: 89 y.o. MRN: [id number]  Patient was last seen in the office on 7/24.  She takes flecainide  50 mg daily for A-fib and buspirone  5 mg twice daily for anxiety.  She has elevated blood pressure but has had issues with hypotension while on antihypertensive medications.  She is here today for concerns about her eyes.  Otherwise doing remarkably well.        Objective:     BP (!) 160/82 (BP Location: Left Arm, Patient Position: Sitting, Cuff Size: Normal)   Pulse 71   Temp 97.7 F (36.5 C) (Oral)   Ht 5' 5.5 (1.664 m)   Wt 146 lb 9.6 oz (66.5 kg)   LMP 05/24/1970   SpO2 95%   BMI 24.02 kg/m    Physical Exam Vitals reviewed.  Constitutional:      Appearance: Normal appearance.  HENT:     Head: Normocephalic.     Nose: Nose normal.  Eyes:     General:        Right eye: No discharge.        Left eye: No discharge.     Extraocular Movements: Extraocular movements intact.     Comments: Lagophthalmos BL Mild peri-orbital darkening  Skin:    General: Skin is warm.  Neurological:     General: No focal deficit present.     Mental Status: She is alert and oriented to person, place, and time.  Psychiatric:        Mood and Affect: Mood normal.        Behavior: Behavior normal.      No results found for any visits on 02/07/24.    The ASCVD Risk score (Arnett DK, et al., 2019) failed to calculate for the following reasons:   The 2019 ASCVD risk score is only valid for ages 52 to 41    Assessment & Plan:   Assessment & Plan Eye exam, routine Patient was seen in an urgent care and diagnosed with blepharitis on 6/23.  She was prescribed topical erythromycin .  Patient says that her eyes had scales on them, were swollen and she felt like there was a gritty sensation in her eye.  At this time she was worried she may have mites.  Patient used the topical erythromycin  and  says that her symptoms have greatly improved.  Today she still feels grittiness in her eyes that is uncomfortable when she moves her eyes and she is still worried that she may have mites on her eyelids.  She tried blink eyedrops and says that the eyedrops caused her to have darkening around her eyes so she is no longer using it.  She wants to see an eye doctor.  On exam, I do not appreciate signs of blepharitis, it appears that she has effectively treated that with the topical erythromycin . 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  Plan to have patient try over-the-counter preservative free eyedrops, recommended hypochlorous acid spray as well as lubricant eye gel that is helpful to prevent dehydration at night.  Will also place referral for optometry. Orders:   Ambulatory referral to Optometry  Encounter for immunization Given today Orders:   Flu vaccine HIGH DOSE PF(Fluzone Trivalent)   Return if symptoms worsen or fail to improve.    Viktoria King, DO

## 2024-02-09 NOTE — Progress Notes (Signed)
 Internal Medicine Clinic Attending  I was physically present during the key portions of the resident provided service and participated in the medical decision making of patient's management care. I reviewed pertinent patient test results.  The assessment, diagnosis, and plan were formulated together and I agree with the documentation in the resident's note.  Shawn Sick, MD

## 2024-02-21 DIAGNOSIS — H9313 Tinnitus, bilateral: Secondary | ICD-10-CM | POA: Diagnosis not present

## 2024-02-21 DIAGNOSIS — H903 Sensorineural hearing loss, bilateral: Secondary | ICD-10-CM | POA: Diagnosis not present

## 2024-03-05 ENCOUNTER — Other Ambulatory Visit: Payer: Self-pay | Admitting: Student

## 2024-03-05 DIAGNOSIS — F419 Anxiety disorder, unspecified: Secondary | ICD-10-CM

## 2024-03-06 DIAGNOSIS — H0100B Unspecified blepharitis left eye, upper and lower eyelids: Secondary | ICD-10-CM | POA: Diagnosis not present

## 2024-03-06 DIAGNOSIS — H0100A Unspecified blepharitis right eye, upper and lower eyelids: Secondary | ICD-10-CM | POA: Diagnosis not present

## 2024-03-06 DIAGNOSIS — H04123 Dry eye syndrome of bilateral lacrimal glands: Secondary | ICD-10-CM | POA: Diagnosis not present

## 2024-03-06 DIAGNOSIS — H02052 Trichiasis without entropian right lower eyelid: Secondary | ICD-10-CM | POA: Diagnosis not present

## 2024-04-18 ENCOUNTER — Ambulatory Visit

## 2024-06-03 NOTE — Progress Notes (Unsigned)
 Therapist, [facility] Return Visit   Referring Provider Heddy Barren, DO [address], [address] [place],  [place] [address]  Primary Care Provider Heddy Barren, DO  Patient Profile: [name] [name] is a 89 y.o. female with a past medical history noteworthy for paroxysmal SVT, HTN, IDA, vitamin D  deficiency, who returns to the [facility] for follow-up of the problem(s) noted below.  Problem List: Nutcracker/jackhammer esophagus diagnosed 2021 Epigastric/right upper quadrant abdominal pain Choledocholithiasis   History of Present Illness   [name] [name] was last seen in the GI office 07/22/2020 by Dr. Teressa -I reviewed previous GI notes   Discussed the use of AI scribe software for clinical note transcription with the patient, who gave verbal consent to proceed.  History of Present Illness [name] [name] is a 89 year old female with a past medical history noteworthy for paroxysmal SVT, HTN, IDA and vitamin D  deficiency who returns to the gastroenterology office for follow-up of nutcracker/jackhammer esophagus, abdominal pain and choledocholithiasis  Previous GI History Dysphagia, weight loss:  - 12/2019: UGI series: Moderate presbyesophagus.  Small prox duodenal tic.  No HH or GERD.    - 12/2019 MBSS: limited due to pt reticence to participate.   functional oropharyngeal swallow ability with no aspiration or deep laryngeal penetration of any consistency tested. Trace laryngeal penetration on swallow of thin via straw is [facility] for age. In addition, no pharyngeal residuals post-swallow.  Minimal difficulty orally transiting pudding bolus with appearance of near gagging but with encouragement pt able to propel posterior and swallow.  No oral issues with cracker, nectar or thin. SLP oral holding/near gagging with puree was anxiety driven.  - EGD September 2021 Dr. Eda while in hospital showed a normal esophagus that was dilated and then biopsied, also showed mild  gastropathy.  The biopsies from these showed no clear sign of etiology.   - High-res esophageal manometry December 2021, normal GE junction, + evidence of nutcracker esophagus.  Trial of nifedipine  10 mg capsules 1 cap 3 times daily.  This caused some lightheadedness, low blood pressure and so she has been relying on hyoscyamine , blender food.   [facility] Second Opinion 2023 w/ Dr. Prentice Posey - [facility] manometry reviewed and concurred with diagnosis of jackhammer esophagus - Agreed with ongoing use of hyoscyamine ; risks outweigh benefits of POEM at her age   Current GI Meds  Hyoscyamine  0.375 mg by mouth twice daily as needed for abdominal pain/esophageal symptoms   Interval History  Right Upper Quadrant Abdominal Pain and Choledocholithiasis - Endorsed symptoms of RUQ and epigastric abdominal pain spring 2025  - AUS 06/20/2023: Abnormal bidirectional main portal vein flow, cholelithiasis without cholecystitis, dilated CBD - Labs 07/10/2023: AST 171, ALT 128, TB 1.1, alk phos 102 - RUQ ultrasound 07/10/2023: Cholelithiasis without acute cholecystitis, chronic distention of CBD without visible stone - MRI MRCP 08/02/2023: 2 suspected distal CBD stones measuring up to 12 mm  - MRI/MRCP was not reported until 08/23/2023 showing choledocholithiasis - My nursing team contacted [name] [name] on 08/23/2023 who reported she was asymptomatic at that time - Repeat labs 08/23/2023: Normal CBC, HFP; GGT 61 - Given age and the fact that she was asymptomatic with normal labs, ERCP was deferred  - Reports intermittent pressure over right upper quadrant -episodes are brief and mild - No sharp pain, nausea or vomiting - Aches aspirin  when she experiences aching - Discussed updated labs today and ER precautions  Nutcracker/Jackhammer esophagus - Maintains a soft and liquid diet due  to poor appetite and concern about food impaction - Consumes approximately four Ensure shakes daily - Avoids raw vegetables and large  meals; prefers mashed potatoes and pumpkin pie - Liquids are easy to swallow - No current dysphagia or odynophagia - Weight is stable in the 140s - Occasionally wakes at night feeling empty and drinks additional Ensure  Bowel Habits - Constipation develops if prune juice is skipped for two to three days, attributed to low fiber intake - Regular prune juice maintains bowel movements - No current difficulty with bowel movements   GI Review of Symptoms Significant for dysphagia and epigastric abdominal pain. Otherwise negative.  General Review of Systems  Review of systems is significant for the pertinent positives and negatives as listed per the HPI.  Full ROS is otherwise negative.  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   Skin cancer of arm 09/26/2014   Splenic artery aneurysm 04/02/2021   Stricture esophagus    TMJ pain dysfunction syndrome 09/25/2018   Vitamin D  deficiency    2010     Past Surgical History   Past Surgical History:  Procedure Laterality Date   A&P, enterocele repair     s/p 2003   ABDOMINAL HYSTERECTOMY     BIOPSY  02/05/2020   Procedure: BIOPSY;  Surgeon: Eda Iha, MD;  Location: [facility] ENDOSCOPY;  Service: Gastroenterology;;   BREAST  REDUCTION SURGERY     s/p 1996      PATIENT DENIES BREAST REDUCTION    ESOPHAGEAL DILATION  02/05/2020   Procedure: ESOPHAGEAL DILATION;  Surgeon: Eda Iha, MD;  Location: [facility] ENDOSCOPY;  Service: Gastroenterology;;   ESOPHAGEAL MANOMETRY N/A 05/07/2020   Procedure: ESOPHAGEAL MANOMETRY (EM);  Surgeon: Teressa Toribio SQUIBB, MD;  Location: [facility] ENDOSCOPY;  Service: Endoscopy;  Laterality: N/A;   ESOPHAGOGASTRODUODENOSCOPY (EGD) WITH PROPOFOL  N/A 02/05/2020   Procedure: ESOPHAGOGASTRODUODENOSCOPY (EGD) WITH PROPOFOL ;  Surgeon: Eda Iha, MD;  Location: [facility] ENDOSCOPY;  Service: Gastroenterology;  Laterality: N/A;   HEMORRHOID SURGERY     LEFT HEART CATH AND CORONARY ANGIOGRAPHY N/A 05/25/2017   Procedure: LEFT HEART CATH AND CORONARY ANGIOGRAPHY;  Surgeon: Dann Candyce RAMAN, MD;  Location: [facility] INVASIVE CV LAB;  Service: Cardiovascular;  Laterality: N/A;     Allergies and Medications   Allergies  Allergen Reactions   Other Anaphylaxis    Reaction to GI Cocktail   Pantoprazole  Other (See Comments)    Upset stomach and weight loss   Zyrtec  [Cetirizine ] Palpitations   Ciprofloxacin      Rash with burning all over body   Nifedipine      Caused low BP  Current Meds  Medication Sig   busPIRone  (BUSPAR ) 5 MG tablet TAKE 1 TABLET TWICE A DAY   [DISCONTINUED] flecainide  (TAMBOCOR ) 50 MG tablet TAKE 2 TABLETS EVERY MORNING AND 1 TABLET AT NIGHT     Family History   Family History  Problem Relation Age of Onset   Stroke Father        @ 36   Hypertension Father    Hypertension Sister    Cancer Sister 45       brain   Breast cancer Other 18       breast   Breast cancer Daughter 68       spread to her brain   Cancer Other 13       colon/MELANOMA   Stroke Mother        medication induced   Kidney disease Brother    Cancer Brother    Social History   Social History   Tobacco Use   Smoking status: Former    Current packs/day: 0.00    Types: Cigarettes    Quit date:  05/24/1968    Years since quitting: 56.0   Smokeless tobacco: Never   Tobacco comments:    only smoked 7 years  Vaping Use   Vaping status: Never Used  Substance Use Topics   Alcohol use: No    Alcohol/week: 0.0 standard drinks of alcohol   Drug use: No   [name] [name] reports that she quit smoking about 56 years ago. Her smoking use included cigarettes. She has never used smokeless tobacco. She reports that she does not drink alcohol and does not use drugs.  Vital Signs and Physical Examination   Vitals:   06/05/24 1048  BP: (!) 122/58  Pulse: 72  Height: 5' 5.5 (1.664 m)  Weight: 148 lb (67.1 kg)  BMI (Calculated): 24.25   General: Elderly, well nourished, no acute distress Head: Normocephalic and atraumatic Eyes: Sclerae anicteric, EOMI Lungs: Clear throughout to auscultation Heart: Regular rate and rhythm; Abdomen: Soft, non tender and non distended. No masses, hepatosplenomegaly or hernias noted. Normal Bowel sounds Rectal: Deferred Musculoskeletal: Symmetrical with no gross deformities    Review of Data  The following data was reviewed at the time of this encounter:  Laboratory Studies      Latest Ref Rng & Units 06/05/2024   11:35 AM 08/23/2023    1:57 PM 07/10/2023    1:00 AM  CBC  WBC 4.0 - 10.5 K/uL 4.9  5.4  6.7   Hemoglobin 12.0 - 15.0 g/dL 86.4  86.9  86.8   Hematocrit 36.0 - 46.0 % 40.1  39.1  39.0   Platelets 150.0 - 400.0 K/uL 242.0  230.0  223     Lab Results  Component Value Date   LIPASE 22 07/10/2023      Latest Ref Rng & Units 06/05/2024   11:35 AM 08/23/2023    1:57 PM 07/10/2023    1:00 AM  CMP  Glucose 70 - 99 mg/dL 94   868   BUN 6 - 23 mg/dL 25   17   Creatinine 9.59 - 1.20 mg/dL 9.20   9.10   Sodium 864 - 145 mEq/L 140   136   Potassium 3.5 - 5.1 mEq/L 4.3   3.8   Chloride 96 - 112 mEq/L 100   100   CO2 19 - 32 mEq/L 28   24   Calcium 8.4 - 10.5 mg/dL 9.6   9.4   Total Protein 6.0 - 8.3  g/dL 7.7  7.5  6.9   Total Bilirubin 0.2 - 1.2 mg/dL  0.4  0.3  1.1   Alkaline Phos 39 - 117 U/L 108  91  102   AST 5 - 37 U/L 35  23  171   ALT 3 - 35 U/L 35  24  128    08/23/2023 GGT 52  Imaging Studies  MRI/MRCP 08/23/2023 Motion degraded images. Cholelithiasis, without associated inflammatory changes. Choledocholithiasis, with two suspected distal CBD stones measuring up to 12 mm. Consider ERCP, as clinically warranted. These results will be called to the ordering clinician or representative by the Radiologist Assistant, and communication documented in the PACS or [employer].  RUQ ultrasound 07/10/2023 1. Cholelithiasis without indication of acute cholecystitis. 2. Chronic distension of the CBD without visible stone.  Abdominal ultrasound 06/20/2023 1. Abnormal bidirectional main portal vein flow on color Doppler evaluation. Spectral Doppler evaluation was performed. 2.  Cholelithiasis without sonographic evidence of cholecystitis.  3.  Dilatation of the common bile duct.  CT angio 03/05/2023 1. Aortic atherosclerosis without any evidence aneurysm or dissection. 2. Small hiatal hernia. 3. Stable dilatation of the common bile duct, unchanged from 2022. 4. Moderate amount of retained stool in the colon suggesting constipation. 5. Diverticulosis without diverticulitis.  CTAP 07/11/2022 CT of the chest: No acute abnormality is noted in the chest.   CT of the abdomen and pelvis: Distended gallbladder without evidence of cholelithiasis. Stable dilatation of the common bile duct is noted unchanged from 2022. This likely represents the patient's steady state.   Diverticulosis without diverticulitis. No other focal abnormality is noted.     GI Procedures and Studies  Dysphagia, weight loss:  - 12/2019: UGI series: Moderate presbyesophagus.  Small prox duodenal tic.  No HH or GERD.    - 12/2019 MBSS: limited due to pt reticence to participate.   functional oropharyngeal swallow ability with no aspiration or deep laryngeal  penetration of any consistency tested. Trace laryngeal penetration on swallow of thin via straw is [facility] for age. In addition, no pharyngeal residuals post-swallow.  Minimal difficulty orally transiting pudding bolus with appearance of near gagging but with encouragement pt able to propel posterior and swallow.  No oral issues with cracker, nectar or thin. SLP oral holding/near gagging with puree was anxiety driven.  - EGD September 2021 Dr. Eda while in hospital showed a normal esophagus that was dilated and then biopsied, also showed mild gastropathy.  The biopsies from these showed no clear sign of etiology.   - High-res esophageal manometry December 2021, normal GE junction, + evidence of nutcracker esophagus.  Trial of nifedipine  10 mg capsules 1 cap 3 times daily.  This caused some lightheadedness, low blood pressure and so she has been relying on hyoscyamine , blender food.   [facility] Second Opinion 2023 w/ Dr. Prentice Posey - [facility] manometry reviewed and concurred with diagnosis of jackhammer esophagus - Agreed with ongoing use of hyoscyamine ; risks outweigh benefits of POEM at her age   Clinical Impression  It is my clinical impression that [name] [name] is a 89 y.o. female with;  Jackhammer/nutcracker esophagus diagnosed 2021 Epigastric/right upper quadrant abdominal pain Choledocholithiasis  [name] [name] presents to the office today for follow-up of jackhammer esophagus, epigastric RUQ abdominal pain and choledocholithiasis.  Over spring 2025 she endorsed symptoms of RUQ abdominal/epigastric abdominal pain.  In February 2025 laboratory studies were noteworthy for elevated hepatic transaminases with a chronically dilated common bile duct on ultrasound imaging.  Follow-up MRCP  March 2025 showed 2 suspected stones in the common bile duct.  This result is finalized 2 to 3 weeks after the study was performed.  When our office received the result, my nurses spoke with [name] [name] who denied any active  symptoms at that time.  Repeat labs showed normal liver enzymes.  Given her age, resolution of symptoms and normal labs, referral for ERCP was deferred.  She continues to experience intermittent brief and fleeting right upper quadrant abdominal pressure.  Discussed performing updated laboratory studies today and reviewed ER precautions.  She has a history of dysphagia dating back to 2021 demonstrating normal upper GI series and EGD.  High-resolution esophageal manometry in 2021 disclosed evidence of jackhammer/nutcracker esophagus.  She was given a trial of nifedipine  but this resulted in lightheadedness and low blood pressure salting and cessation of the medication.  Since that time she has been treated with high-dose hyoscyamine  0.375 mg on an as needed basis as well as a soft/pured diet.  She participated in a second opinion at [facility] gastroenterology in 2023 with Dr. Prentice Posey who concurred with the manometry findings and ongoing management with hyoscyamine .  Her son had inquired about the possibility of POEM, however, Dr. Posey felt that the risks would outweigh the benefits at [name] [name]'s age.  Her symptoms appear overall stable at this time.  I have recommended continuing with high-dose hyoscyamine  as needed. If the medication stops working or symptoms progress can consider further evaluation or revisiting nitrates or calcium channel blockers which she had difficulty tolerating in the past.  I am of the same opinion as Dr. Posey that POEM has risks at her age I would not advise pursuing that as a modality.  Plan  Labs today: CBC, BMP, HFP, GGT Per Tori precautions regarding abdominal pain and choledocholithiasis reviewed Continue hyoscyamine  0.375 mg sublingual twice daily as needed for management of jackhammer esophagus; reserve nitrates or CCB's for refractory symptoms Continue soft/pured diet conjunction with Ensure daily Monitor weight and anthropometrics  Planned Follow Up 12 months  The  patient or caregiver verbalized understanding of the material covered, with no barriers to understanding. All questions were answered. Patient or caregiver is agreeable with the plan outlined above.    It was a pleasure to see [name] [name].  If you have any questions or concerns regarding this evaluation, do not hesitate to contact me.  Inocente Hausen, MD  Gastroenterology   I spent total of 30 minutes in both face-to-face (20 minutes interview) and non-face-to-face (10 minutes chart review, care coordination, documentation)  activities, excluding procedures performed, for the visit on the date of this encounter.   "

## 2024-06-04 ENCOUNTER — Other Ambulatory Visit: Payer: Self-pay

## 2024-06-05 ENCOUNTER — Encounter: Payer: Self-pay | Admitting: Pediatrics

## 2024-06-05 ENCOUNTER — Other Ambulatory Visit

## 2024-06-05 ENCOUNTER — Ambulatory Visit: Admitting: Pediatrics

## 2024-06-05 VITALS — BP 122/58 | HR 72 | Ht 65.5 in | Wt 148.0 lb

## 2024-06-05 DIAGNOSIS — R1011 Right upper quadrant pain: Secondary | ICD-10-CM | POA: Diagnosis not present

## 2024-06-05 DIAGNOSIS — K224 Dyskinesia of esophagus: Secondary | ICD-10-CM | POA: Diagnosis not present

## 2024-06-05 DIAGNOSIS — K8050 Calculus of bile duct without cholangitis or cholecystitis without obstruction: Secondary | ICD-10-CM

## 2024-06-05 LAB — CBC WITH DIFFERENTIAL/PLATELET
Basophils Absolute: 0.1 K/uL (ref 0.0–0.1)
Basophils Relative: 1.2 % (ref 0.0–3.0)
Eosinophils Absolute: 0.1 K/uL (ref 0.0–0.7)
Eosinophils Relative: 1.3 % (ref 0.0–5.0)
HCT: 40.1 % (ref 36.0–46.0)
Hemoglobin: 13.5 g/dL (ref 12.0–15.0)
Lymphocytes Relative: 26.1 % (ref 12.0–46.0)
Lymphs Abs: 1.3 K/uL (ref 0.7–4.0)
MCHC: 33.6 g/dL (ref 30.0–36.0)
MCV: 95.7 fl (ref 78.0–100.0)
Monocytes Absolute: 0.5 K/uL (ref 0.1–1.0)
Monocytes Relative: 9.5 % (ref 3.0–12.0)
Neutro Abs: 3 K/uL (ref 1.4–7.7)
Neutrophils Relative %: 61.9 % (ref 43.0–77.0)
Platelets: 242 K/uL (ref 150.0–400.0)
RBC: 4.19 Mil/uL (ref 3.87–5.11)
RDW: 13.1 % (ref 11.5–15.5)
WBC: 4.9 K/uL (ref 4.0–10.5)

## 2024-06-05 LAB — GAMMA GT: GGT: 51 U/L (ref 7–51)

## 2024-06-05 LAB — BASIC METABOLIC PANEL WITH GFR
BUN: 25 mg/dL — ABNORMAL HIGH (ref 6–23)
CO2: 28 meq/L (ref 19–32)
Calcium: 9.6 mg/dL (ref 8.4–10.5)
Chloride: 100 meq/L (ref 96–112)
Creatinine, Ser: 0.79 mg/dL (ref 0.40–1.20)
GFR: 64.96 mL/min
Glucose, Bld: 94 mg/dL (ref 70–99)
Potassium: 4.3 meq/L (ref 3.5–5.1)
Sodium: 140 meq/L (ref 135–145)

## 2024-06-05 LAB — HEPATIC FUNCTION PANEL
ALT: 35 U/L (ref 3–35)
AST: 35 U/L (ref 5–37)
Albumin: 4.6 g/dL (ref 3.5–5.2)
Alkaline Phosphatase: 108 U/L (ref 39–117)
Bilirubin, Direct: 0.1 mg/dL (ref 0.1–0.3)
Total Bilirubin: 0.4 mg/dL (ref 0.2–1.2)
Total Protein: 7.7 g/dL (ref 6.0–8.3)

## 2024-06-05 MED ORDER — FLECAINIDE ACETATE 50 MG PO TABS: ORAL_TABLET | ORAL | 2 refills | Status: AC

## 2024-06-05 NOTE — Patient Instructions (Signed)
 Your provider has requested that you go to the basement level for lab work before leaving today. Press B on the elevator. The lab is located at the first door on the left as you exit the elevator.  Due to recent changes in healthcare laws, you may see the results of your imaging and laboratory studies on MyChart before your provider has had a chance to review them.  We understand that in some cases there may be results that are confusing or concerning to you. Not all laboratory results come back in the same time frame and the provider may be waiting for multiple results in order to interpret others.  Please give us  48 hours in order for your provider to thoroughly review all the results before contacting the office for clarification of your results.   Thank you for entrusting me with your care and for choosing [facility], Dr. Inocente Hausen  _______________________________________________________  If your blood pressure at your visit was 140/90 or greater, please contact your primary care physician to follow up on this.  _______________________________________________________  If you are age 89 or older, your body mass index should be between 23-30. Your Body mass index is 24.25 kg/m. If this is out of the aforementioned range listed, please consider follow up with your Primary Care Provider.  If you are age 89 or younger, your body mass index should be between 19-25. Your Body mass index is 24.25 kg/m. If this is out of the aformentioned range listed, please consider follow up with your Primary Care Provider.   ________________________________________________________  The [facility] providers would like to encourage you to use MYCHART to communicate with providers for non-urgent requests or questions.  Due to long hold times on the telephone, sending your provider a message by [facility] may be a faster and more efficient way to get a response.  Please allow 48 business hours for a  response.  Please remember that this is for non-urgent requests.  _______________________________________________________  [facility] is using a team-based approach to care.  Your team is made up of your doctor and two to three APPS. Our APPS (Nurse Practitioners and Physician Assistants) work with your physician to ensure care continuity for you. They are fully qualified to address your health concerns and develop a treatment plan. They communicate directly with your gastroenterologist to care for you. Seeing the Advanced Practice Practitioners on your physician's team can help you by facilitating care more promptly, often allowing for earlier appointments, access to diagnostic testing, procedures, and other specialty referrals.

## 2024-06-06 ENCOUNTER — Ambulatory Visit: Payer: Self-pay | Admitting: Pediatrics

## 2024-06-07 ENCOUNTER — Ambulatory Visit
